# Patient Record
Sex: Female | Born: 1940 | Race: Black or African American | Hispanic: No | Marital: Single | Smoking: Former smoker
Health system: Southern US, Community
[De-identification: ages and names within clinical notes are randomized; demographics above are authoritative.]

## PROBLEM LIST (undated history)

## (undated) DIAGNOSIS — I10 Essential (primary) hypertension: Secondary | ICD-10-CM

## (undated) DIAGNOSIS — Z720 Tobacco use: Secondary | ICD-10-CM

## (undated) DIAGNOSIS — I499 Cardiac arrhythmia, unspecified: Secondary | ICD-10-CM

## (undated) DIAGNOSIS — K5790 Diverticulosis of intestine, part unspecified, without perforation or abscess without bleeding: Secondary | ICD-10-CM

## (undated) DIAGNOSIS — Z9889 Other specified postprocedural states: Secondary | ICD-10-CM

## (undated) DIAGNOSIS — R011 Cardiac murmur, unspecified: Secondary | ICD-10-CM

## (undated) DIAGNOSIS — R197 Diarrhea, unspecified: Secondary | ICD-10-CM

## (undated) DIAGNOSIS — E119 Type 2 diabetes mellitus without complications: Secondary | ICD-10-CM

## (undated) DIAGNOSIS — M1990 Unspecified osteoarthritis, unspecified site: Secondary | ICD-10-CM

## (undated) DIAGNOSIS — R600 Localized edema: Secondary | ICD-10-CM

## (undated) DIAGNOSIS — I509 Heart failure, unspecified: Secondary | ICD-10-CM

## (undated) DIAGNOSIS — Z8249 Family history of ischemic heart disease and other diseases of the circulatory system: Secondary | ICD-10-CM

## (undated) DIAGNOSIS — Z8719 Personal history of other diseases of the digestive system: Secondary | ICD-10-CM

## (undated) DIAGNOSIS — J45909 Unspecified asthma, uncomplicated: Secondary | ICD-10-CM

## (undated) DIAGNOSIS — E785 Hyperlipidemia, unspecified: Secondary | ICD-10-CM

## (undated) DIAGNOSIS — K219 Gastro-esophageal reflux disease without esophagitis: Secondary | ICD-10-CM

## (undated) HISTORY — DX: Essential (primary) hypertension: I10

## (undated) HISTORY — PX: OTHER SURGICAL HISTORY: SHX169

## (undated) HISTORY — DX: Diarrhea, unspecified: R19.7

## (undated) HISTORY — DX: Hyperlipidemia, unspecified: E78.5

## (undated) HISTORY — DX: Family history of ischemic heart disease and other diseases of the circulatory system: Z82.49

## (undated) HISTORY — PX: CATARACT EXTRACTION, BILATERAL: SHX1313

## (undated) HISTORY — DX: Cardiac arrhythmia, unspecified: I49.9

## (undated) HISTORY — DX: Personal history of other diseases of the digestive system: Z98.890

## (undated) HISTORY — DX: Diverticulosis of intestine, part unspecified, without perforation or abscess without bleeding: K57.90

## (undated) HISTORY — DX: Tobacco use: Z72.0

## (undated) HISTORY — DX: Gastro-esophageal reflux disease without esophagitis: K21.9

## (undated) HISTORY — DX: Personal history of other diseases of the digestive system: Z87.19

## (undated) HISTORY — PX: TONSILLECTOMY: SUR1361

## (undated) HISTORY — PX: COLONOSCOPY: SHX174

## (undated) HISTORY — DX: Unspecified asthma, uncomplicated: J45.909

## (undated) HISTORY — DX: Localized edema: R60.0

## (undated) SURGERY — Surgical Case
Anesthesia: *Unknown

---

## 1998-06-03 ENCOUNTER — Encounter: Payer: Self-pay | Admitting: Gastroenterology

## 1998-06-03 ENCOUNTER — Ambulatory Visit (HOSPITAL_COMMUNITY): Admission: RE | Admit: 1998-06-03 | Discharge: 1998-06-03 | Payer: Self-pay | Admitting: Gastroenterology

## 1998-06-09 ENCOUNTER — Other Ambulatory Visit: Admission: RE | Admit: 1998-06-09 | Discharge: 1998-06-09 | Payer: Self-pay | Admitting: Obstetrics and Gynecology

## 1999-05-26 ENCOUNTER — Other Ambulatory Visit: Admission: RE | Admit: 1999-05-26 | Discharge: 1999-05-26 | Payer: Self-pay | Admitting: Obstetrics and Gynecology

## 1999-10-18 ENCOUNTER — Encounter: Payer: Self-pay | Admitting: Internal Medicine

## 1999-10-18 ENCOUNTER — Encounter: Admission: RE | Admit: 1999-10-18 | Discharge: 1999-10-18 | Payer: Self-pay | Admitting: Internal Medicine

## 2000-05-25 ENCOUNTER — Other Ambulatory Visit: Admission: RE | Admit: 2000-05-25 | Discharge: 2000-05-25 | Payer: Self-pay | Admitting: Obstetrics and Gynecology

## 2000-11-01 ENCOUNTER — Encounter: Payer: Self-pay | Admitting: Internal Medicine

## 2000-11-01 ENCOUNTER — Encounter: Admission: RE | Admit: 2000-11-01 | Discharge: 2000-11-01 | Payer: Self-pay | Admitting: Internal Medicine

## 2000-11-21 ENCOUNTER — Encounter: Payer: Self-pay | Admitting: Internal Medicine

## 2000-11-21 ENCOUNTER — Encounter: Admission: RE | Admit: 2000-11-21 | Discharge: 2000-11-21 | Payer: Self-pay | Admitting: Internal Medicine

## 2001-04-25 ENCOUNTER — Encounter: Payer: Self-pay | Admitting: Internal Medicine

## 2001-04-25 ENCOUNTER — Encounter: Admission: RE | Admit: 2001-04-25 | Discharge: 2001-04-25 | Payer: Self-pay | Admitting: Internal Medicine

## 2001-05-27 ENCOUNTER — Other Ambulatory Visit: Admission: RE | Admit: 2001-05-27 | Discharge: 2001-05-27 | Payer: Self-pay | Admitting: *Deleted

## 2001-11-22 ENCOUNTER — Encounter: Admission: RE | Admit: 2001-11-22 | Discharge: 2001-11-22 | Payer: Self-pay | Admitting: Internal Medicine

## 2001-11-22 ENCOUNTER — Encounter: Payer: Self-pay | Admitting: Internal Medicine

## 2002-04-22 ENCOUNTER — Ambulatory Visit (HOSPITAL_BASED_OUTPATIENT_CLINIC_OR_DEPARTMENT_OTHER): Admission: RE | Admit: 2002-04-22 | Discharge: 2002-04-22 | Payer: Self-pay | Admitting: Orthopaedic Surgery

## 2002-05-28 ENCOUNTER — Other Ambulatory Visit: Admission: RE | Admit: 2002-05-28 | Discharge: 2002-05-28 | Payer: Self-pay | Admitting: Obstetrics and Gynecology

## 2002-12-02 ENCOUNTER — Encounter: Payer: Self-pay | Admitting: Internal Medicine

## 2002-12-02 ENCOUNTER — Encounter: Admission: RE | Admit: 2002-12-02 | Discharge: 2002-12-02 | Payer: Self-pay | Admitting: Internal Medicine

## 2003-06-09 ENCOUNTER — Other Ambulatory Visit: Admission: RE | Admit: 2003-06-09 | Discharge: 2003-06-09 | Payer: Self-pay | Admitting: Obstetrics and Gynecology

## 2003-06-24 ENCOUNTER — Encounter: Admission: RE | Admit: 2003-06-24 | Discharge: 2003-06-24 | Payer: Self-pay | Admitting: Internal Medicine

## 2003-12-03 ENCOUNTER — Encounter: Admission: RE | Admit: 2003-12-03 | Discharge: 2003-12-03 | Payer: Self-pay | Admitting: Internal Medicine

## 2004-05-10 ENCOUNTER — Encounter: Admission: RE | Admit: 2004-05-10 | Discharge: 2004-05-10 | Payer: Self-pay | Admitting: Otolaryngology

## 2004-05-13 ENCOUNTER — Ambulatory Visit (HOSPITAL_COMMUNITY): Admission: RE | Admit: 2004-05-13 | Discharge: 2004-05-13 | Payer: Self-pay | Admitting: Otolaryngology

## 2004-05-16 ENCOUNTER — Encounter (INDEPENDENT_AMBULATORY_CARE_PROVIDER_SITE_OTHER): Payer: Self-pay | Admitting: Specialist

## 2004-05-16 ENCOUNTER — Ambulatory Visit (HOSPITAL_BASED_OUTPATIENT_CLINIC_OR_DEPARTMENT_OTHER): Admission: RE | Admit: 2004-05-16 | Discharge: 2004-05-16 | Payer: Self-pay | Admitting: Otolaryngology

## 2004-05-18 ENCOUNTER — Emergency Department (HOSPITAL_COMMUNITY): Admission: EM | Admit: 2004-05-18 | Discharge: 2004-05-18 | Payer: Self-pay | Admitting: Emergency Medicine

## 2004-11-04 ENCOUNTER — Ambulatory Visit: Payer: Self-pay | Admitting: Gastroenterology

## 2004-11-24 ENCOUNTER — Ambulatory Visit: Payer: Self-pay | Admitting: Gastroenterology

## 2004-12-05 ENCOUNTER — Encounter: Admission: RE | Admit: 2004-12-05 | Discharge: 2004-12-05 | Payer: Self-pay | Admitting: Internal Medicine

## 2005-02-10 ENCOUNTER — Encounter: Admission: RE | Admit: 2005-02-10 | Discharge: 2005-02-10 | Payer: Self-pay | Admitting: Internal Medicine

## 2005-12-12 ENCOUNTER — Encounter: Admission: RE | Admit: 2005-12-12 | Discharge: 2005-12-12 | Payer: Self-pay | Admitting: Internal Medicine

## 2006-01-04 ENCOUNTER — Encounter: Admission: RE | Admit: 2006-01-04 | Discharge: 2006-01-04 | Payer: Self-pay | Admitting: Internal Medicine

## 2007-01-08 ENCOUNTER — Encounter: Admission: RE | Admit: 2007-01-08 | Discharge: 2007-01-08 | Payer: Self-pay | Admitting: Internal Medicine

## 2007-04-25 ENCOUNTER — Encounter: Admission: RE | Admit: 2007-04-25 | Discharge: 2007-04-25 | Payer: Self-pay | Admitting: Internal Medicine

## 2007-07-12 ENCOUNTER — Ambulatory Visit: Payer: Self-pay | Admitting: Gastroenterology

## 2007-07-12 LAB — CONVERTED CEMR LAB
AST: 21 units/L (ref 0–37)
Albumin: 3.9 g/dL (ref 3.5–5.2)
Alkaline Phosphatase: 68 units/L (ref 39–117)
BUN: 12 mg/dL (ref 6–23)
Bacteria, UA: NEGATIVE
Basophils Relative: 0.5 % (ref 0.0–1.0)
Bilirubin Urine: NEGATIVE
Calcium: 9.3 mg/dL (ref 8.4–10.5)
Creatinine, Ser: 0.9 mg/dL (ref 0.4–1.2)
Crystals: NEGATIVE
Eosinophils Relative: 5 % (ref 0.0–5.0)
GFR calc Af Amer: 80 mL/min
GFR calc non Af Amer: 66 mL/min
HCT: 41.7 % (ref 36.0–46.0)
Hemoglobin: 13.9 g/dL (ref 12.0–15.0)
Leukocytes, UA: NEGATIVE
Lymphocytes Relative: 47.3 % — ABNORMAL HIGH (ref 12.0–46.0)
Monocytes Absolute: 0.4 10*3/uL (ref 0.2–0.7)
Monocytes Relative: 7.4 % (ref 3.0–11.0)
Mucus, UA: NEGATIVE
Nitrite: NEGATIVE
RDW: 12.4 % (ref 11.5–14.6)
Specific Gravity, Urine: 1.025 (ref 1.000–1.03)
Total Bilirubin: 0.7 mg/dL (ref 0.3–1.2)
Total Protein, Urine: NEGATIVE mg/dL
Total Protein: 7 g/dL (ref 6.0–8.3)
Urobilinogen, UA: 0.2 (ref 0.0–1.0)
WBC: 4.8 10*3/uL (ref 4.5–10.5)
pH: 6 (ref 5.0–8.0)

## 2007-07-15 ENCOUNTER — Ambulatory Visit: Payer: Self-pay | Admitting: Internal Medicine

## 2007-07-23 ENCOUNTER — Ambulatory Visit: Payer: Self-pay | Admitting: Gastroenterology

## 2007-07-23 LAB — CONVERTED CEMR LAB
Bilirubin Urine: NEGATIVE
Crystals: NEGATIVE
Mucus, UA: NEGATIVE
Nitrite: NEGATIVE
Urine Glucose: NEGATIVE mg/dL
Urobilinogen, UA: 0.2 (ref 0.0–1.0)

## 2007-08-20 ENCOUNTER — Ambulatory Visit: Payer: Self-pay | Admitting: Gastroenterology

## 2007-08-20 DIAGNOSIS — R1013 Epigastric pain: Secondary | ICD-10-CM | POA: Insufficient documentation

## 2007-12-11 HISTORY — PX: TRANSTHORACIC ECHOCARDIOGRAM: SHX275

## 2008-01-10 ENCOUNTER — Encounter: Admission: RE | Admit: 2008-01-10 | Discharge: 2008-01-10 | Payer: Self-pay | Admitting: Obstetrics and Gynecology

## 2008-08-25 ENCOUNTER — Encounter: Payer: Self-pay | Admitting: Gastroenterology

## 2009-01-14 ENCOUNTER — Inpatient Hospital Stay (HOSPITAL_COMMUNITY): Admission: EM | Admit: 2009-01-14 | Discharge: 2009-01-16 | Payer: Self-pay | Admitting: Emergency Medicine

## 2009-01-15 HISTORY — PX: CARDIAC CATHETERIZATION: SHX172

## 2009-01-25 ENCOUNTER — Encounter: Admission: RE | Admit: 2009-01-25 | Discharge: 2009-01-25 | Payer: Self-pay | Admitting: Internal Medicine

## 2010-01-20 ENCOUNTER — Ambulatory Visit: Payer: Self-pay | Admitting: Gastroenterology

## 2010-01-20 DIAGNOSIS — R1310 Dysphagia, unspecified: Secondary | ICD-10-CM | POA: Insufficient documentation

## 2010-01-20 DIAGNOSIS — K219 Gastro-esophageal reflux disease without esophagitis: Secondary | ICD-10-CM | POA: Insufficient documentation

## 2010-01-21 ENCOUNTER — Telehealth: Payer: Self-pay | Admitting: Gastroenterology

## 2010-02-03 ENCOUNTER — Ambulatory Visit (HOSPITAL_COMMUNITY): Admission: RE | Admit: 2010-02-03 | Discharge: 2010-02-03 | Payer: Self-pay | Admitting: Obstetrics and Gynecology

## 2010-02-18 ENCOUNTER — Ambulatory Visit: Payer: Self-pay | Admitting: Gastroenterology

## 2010-03-25 ENCOUNTER — Ambulatory Visit: Payer: Self-pay | Admitting: Gastroenterology

## 2010-04-05 ENCOUNTER — Ambulatory Visit: Payer: Self-pay | Admitting: Gastroenterology

## 2010-05-17 ENCOUNTER — Encounter: Admission: RE | Admit: 2010-05-17 | Discharge: 2010-05-17 | Payer: Self-pay | Admitting: Internal Medicine

## 2010-07-03 ENCOUNTER — Encounter: Payer: Self-pay | Admitting: Obstetrics and Gynecology

## 2010-07-03 ENCOUNTER — Encounter: Payer: Self-pay | Admitting: Internal Medicine

## 2010-07-12 NOTE — Procedures (Signed)
Summary: colonoscopy   Colonoscopy  Procedure date:  11/24/2004  Findings:      Results: Hemorrhoids.     Results: Diverticulosis.       Location:  [facility].   Patient Name: Megan Hull, Megan Hull MRN:  Procedure Procedures: Colonoscopy CPT: 40981.  Personnel: Endoscopist: Barbette Hair. Arlyce Dice, MD.  Patient Consent: Procedure, Alternatives, Risks and Benefits discussed, consent obtained, from patient.  Indications Symptoms: Hematochezia.  History  Current Medications: Patient is not currently taking Coumadin.  Pre-Exam Physical: Performed Nov 24, 2004. Cardio-pulmonary exam, HEENT exam , Abdominal exam, Mental status exam WNL.  Exam Exam: Extent of exam reached: Cecum, extent intended: Cecum.  The cecum was identified by IC valve. Colon retroflexion performed. ASA Classification: III. Tolerance: good.  Monitoring: Pulse and BP monitoring, Oximetry used. Supplemental O2 given. at 2 Liters.  Colon Prep Used Miralax for colon prep. Prep results: good.  Sedation Meds: Patient assessed and found to be appropriate for moderate (conscious) sedation. Sedation was managed by the Endoscopist. Fentanyl 125 mcg. given IV. Versed 12 given IV.  Findings - DIVERTICULOSIS: Descending Colon to Sigmoid Colon. ICD9: Diverticulosis: 562.10. Comments: Diffuse diverticulosis.  NORMAL EXAM: Cecum.  HEMORRHOIDS: Internal. ICD9: Hemorrhoids, Internal: 455.0.   Assessment Abnormal examination, see findings above.  Diagnoses: 562.10: Diverticulosis.  455.0: Hemorrhoids, Internal.   Events  Unplanned Interventions: No intervention was required.  Unplanned Events: There were no complications. Plans  Post Exam Instructions: Post sedation instructions given.  Patient Education: Patient given standard instructions for: Diverticulosis. Hemorrhoids.  Disposition: After procedure patient sent to recovery. After recovery patient sent home.  Scheduling/Referral: Follow-Up  prn.  This report was created from the original endoscopy report, which was reviewed and signed by the above listed endoscopist.

## 2010-07-12 NOTE — Letter (Signed)
Summary: EGD Instructions  [facility]  [address] [place], [place] [address]   Phone: [phone]  Fax: [phone]       Megan Hull    Feb 27, 1941    MRN: [id number]       Procedure Day /Date:FRIDAY 02/18/2010     Arrival Time: 2PM     Procedure Time:3PM     Location of Procedure:                    X  [facility] (4th Floor)  PREPARATION FOR ENDOSCOPY/DIL   On 02/18/2010  THE DAY OF THE PROCEDURE:  1.   No solid foods, milk or milk products are allowed after midnight the night before your procedure.  2.   Do not drink anything colored red or purple.  Avoid juices with pulp.  No orange juice.  3.  You may drink clear liquids until1PM, which is 2 hours before your procedure.                                                                                                CLEAR LIQUIDS INCLUDE: Water Jello Ice Popsicles Tea (sugar ok, no milk/cream) Powdered fruit flavored drinks Coffee (sugar ok, no milk/cream) Gatorade Juice: apple, white grape, white cranberry  Lemonade Clear bullion, consomm, broth Carbonated beverages (any kind) Strained chicken noodle soup Hard Candy   MEDICATION INSTRUCTIONS  Unless otherwise instructed, you should take regular prescription medications with a small sip of water as early as possible the morning of your procedure.            OTHER INSTRUCTIONS  You will need a responsible adult at least 71 years of age to accompany you and drive you home.   This person must remain in the waiting room during your procedure.  Wear loose fitting clothing that is easily removed.  Leave jewelry and other valuables at home.  However, you may wish to bring a book to read or an iPod/MP3 player to listen to music as you wait for your procedure to start.  Remove all body piercing jewelry and leave at home.  Total time from sign-in until discharge is approximately 2-3 hours.  You should go home directly after  your procedure and rest.  You can resume normal activities the day after your procedure.  The day of your procedure you should not:   Drive   Make legal decisions   Operate machinery   Drink alcohol   Return to work  You will receive specific instructions about eating, activities and medications before you leave.    The above instructions have been reviewed and explained to me by   _______________________    I fully understand and can verbalize these instructions _____________________________ Date _________

## 2010-07-12 NOTE — Assessment & Plan Note (Signed)
Summary: f/u from endo--ch.   History of Present Illness Visit Type: Follow-up Visit Primary GI MD: Melvia Heaps MD [facility] Primary Provider: [name], MD Requesting Provider: na Chief Complaint: F/u from Endo. Pt c/o diarrhea, and more hiccups but denies any other GI complaints   History of Present Illness:   [name] has returned following upper endoscopy and esophageal dilatation.  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  She no longer has dysphagia.  She has occasional breakthrough pyrosis.  She also may develop very limited episodes of hiccups while eating.  She remains on Zegerid.   GI Review of Systems    Reports belching.      Denies abdominal pain, acid reflux, bloating, chest pain, dysphagia with liquids, dysphagia with solids, heartburn, loss of appetite, nausea, vomiting, vomiting blood, weight loss, and  weight gain.      Reports diarrhea.     Denies anal fissure, black tarry stools, change in bowel habit, constipation, diverticulosis, fecal incontinence, heme positive stool, hemorrhoids, irritable bowel syndrome, jaundice, light color stool, liver problems, rectal bleeding, and  rectal pain.    Current Medications (verified): 1)  Losartan Potassium-Hctz 100-12.5 Mg Tabs (Losartan Potassium-Hctz) .... Once Daily 2)  Metoprolol Tartrate 25 Mg Tabs (Metoprolol Tartrate) .... Take 1/2 Tablet By Mouth Two Times A Day 3)  Simvastatin 80 Mg Tabs (Simvastatin) .... Once Daily 4)  Aspirin 81 Mg Tbec (Aspirin) .... Take 2 Tablets Daily 5)  Maalox Plus 225-200-25 Mg/16ml Susp (Alum & Mag Hydroxide-Simeth) .... Two Times A Day 6)  Zegerid 40-1100 Mg Caps (Omeprazole-Sodium Bicarbonate) .... Take One Tab One Half Hour Before Breakfast and H.s.  Allergies (verified): 1)  ! 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 Tonsillectomy  Family History: Reviewed history from 01/20/2010 and no changes required. Family History of Colon Cancer:Brother Family History of Diabetes:  Family History of Heart Disease:   Social History: Reviewed history from 01/20/2010 and no changes required. Occupation: Retired Patient is a former smoker.  Alcohol Use - yes Daily Caffeine Use Illicit Drug Use - no  Review of Systems  The patient denies allergy/sinus, anemia, anxiety-new, arthritis/joint pain, back pain, blood in urine, breast changes/lumps, change in vision, confusion, cough, coughing up blood, depression-new, fainting, fatigue, fever, headaches-new, hearing problems, heart murmur, heart rhythm changes, itching, menstrual pain, muscle pains/cramps, night sweats, nosebleeds, pregnancy symptoms, shortness of breath, skin rash, sleeping problems, sore throat, swelling of feet/legs, swollen lymph glands, thirst - excessive , urination - excessive , urination changes/pain, urine leakage, vision changes, and voice change.    Vital Signs:  Patient profile:   70 year old female Height:      65 inches Weight:      162 pounds BMI:     27.06 BSA:     1.81 Pulse rate:   72 / minute Pulse rhythm:   regular BP sitting:   112 / 60  (left arm) Cuff size:   regular  Vitals Entered By: Ok Anis CMA (March 25, 2010 9:48 AM)   Impression & Recommendations:  Problem # 1:  DYSPHAGIA UNSPECIFIED (ICD-787.20) Assessment Improved Plan repeat dilatation p.r.n.  Problem # 2:  ESOPHAGEAL REFLUX (ICD-530.81) Symptoms are fairly well controlled zegerid.  Plan to continue with the same.  Patient Instructions: 1)  Copy sent  to : [name], MD 2)  Follow up as needed  3)  The medication list was reviewed and reconciled.  All changed / newly  prescribed medications were explained.  A complete medication list was provided to the patient / caregiver.

## 2010-07-12 NOTE — Letter (Signed)
Summary: [facility] Instructions  [facility]  [address] [place], [place] [address]   Phone: [phone]  Fax: [phone]       Megan Hull    December 13, 1940    MRN: [id number]        Procedure Day /Date:TUESDAY 04/05/2010     Arrival Time:9:30AM     Procedure Time:10:30AM     Location of Procedure:                    X  [facility] (4th Floor)                        PREPARATION FOR COLONOSCOPY WITH MOVIPREP   Starting 5 days prior to your procedure 10/20/2011o not eat nuts, seeds, popcorn, corn, beans, peas,  salads, or any raw vegetables.  Do not take any fiber supplements (e.g. Metamucil, Citrucel, and Benefiber).  THE DAY BEFORE YOUR PROCEDURE         DATE:04/04/2010 DAY: MONDAY  1.  Drink clear liquids the entire day-NO SOLID FOOD  2.  Do not drink anything colored red or purple.  Avoid juices with pulp.  No orange juice.  3.  Drink at least 64 oz. (8 glasses) of fluid/clear liquids during the day to prevent dehydration and help the prep work efficiently.  CLEAR LIQUIDS INCLUDE: Water Jello Ice Popsicles Tea (sugar ok, no milk/cream) Powdered fruit flavored drinks Coffee (sugar ok, no milk/cream) Gatorade Juice: apple, white grape, white cranberry  Lemonade Clear bullion, consomm, broth Carbonated beverages (any kind) Strained chicken noodle soup Hard Candy                             4.  In the morning, mix first dose of MoviPrep solution:    Empty 1 Pouch A and 1 Pouch B into the disposable container    Add lukewarm drinking water to the top line of the container. Mix to dissolve    Refrigerate (mixed solution should be used within 24 hrs)  5.  Begin drinking the prep at 5:00 p.m. The MoviPrep container is divided by 4 marks.   Every 15 minutes drink the solution down to the next Megan (approximately 8 oz) until the full liter is complete.   6.  Follow completed prep with 16 oz of clear liquid of your choice  (Nothing red or purple).  Continue to drink clear liquids until bedtime.  7.  Before going to bed, mix second dose of MoviPrep solution:    Empty 1 Pouch A and 1 Pouch B into the disposable container    Add lukewarm drinking water to the top line of the container. Mix to dissolve    Refrigerate  THE DAY OF YOUR PROCEDURE      DATE: 04/05/2010   DAY: TUESDAY  Beginning at 7:30AM. (5 hours before procedure):         1. Every 15 minutes, drink the solution down to the next Megan (approx 8 oz) until the full liter is complete.  2. Follow completed prep with 16 oz. of clear liquid of your choice.    3. You may drink clear liquids until 8:30AM (2 HOURS BEFORE PROCEDURE).   MEDICATION INSTRUCTIONS  Unless otherwise instructed, you should take regular prescription medications with a small sip of water   as early as possible the morning of your procedure.  OTHER INSTRUCTIONS  You will need a responsible adult at least 71 years of age to accompany you and drive you home.   This person must remain in the waiting room during your procedure.  Wear loose fitting clothing that is easily removed.  Leave jewelry and other valuables at home.  However, you may wish to bring a book to read or  an iPod/MP3 player to listen to music as you wait for your procedure to start.  Remove all body piercing jewelry and leave at home.  Total time from sign-in until discharge is approximately 2-3 hours.  You should go home directly after your procedure and rest.  You can resume normal activities the  day after your procedure.  The day of your procedure you should not:   Drive   Make legal decisions   Operate machinery   Drink alcohol   Return to work  You will receive specific instructions about eating, activities and medications before you leave.    The above instructions have been reviewed and explained to me by   _______________________    I fully understand and can  verbalize these instructions _____________________________ Date _________

## 2010-07-12 NOTE — Procedures (Signed)
Summary: Colonoscopy/[place]  Colonoscopy/[place]   Imported By: Sherian Rein 01/20/2010 15:09:25  _____________________________________________________________________  External Attachment:    Type:   Image     Comment:   External Document

## 2010-07-12 NOTE — Progress Notes (Signed)
Summary: Questions about meds.  Phone Note Call from Patient Call back at Home Phone [phone]   Caller: Patient Call For: Dr. Arlyce Dice Reason for Call: Talk to Nurse Summary of Call: has some questions about her meds. what she is suppose to be taking Initial call taken by: Karna Christmas,  January 21, 2010 8:18 AM  Follow-up for Phone Call        Pt. wanted to clarify med orders from [facility] yesterday. Pt. advised to stop Lansoprazole and begin Zegerid two times a day. No other ?'s at this time. Pt. instructed to call back as needed.  Follow-up by: Laureen Ochs LPN,  January 21, 2010 9:16 AM

## 2010-07-12 NOTE — Assessment & Plan Note (Signed)
Summary: painful swallowing--ch.   History of Present Illness Visit Type: Follow-up Visit Primary GI MD: Melvia Heaps MD [facility] Primary Provider: [name], MD Chief Complaint: Solid food dysphagia with severe pain History of Present Illness:   Megan Hull is a 70yo female here for evaluation of odynophagia and dysphagia.  For several months she has had severe burning chest discomfort.  She has dysphagia to solids and burning discomfort with swallowing.  She is taking Prevacid with only partial relief.  She is on no gastric irritants.  Weight has been stable. She also complains of heaviness and fullness  in her upper abdomen.  Family history is pertinent for a  brother who had colon cancer.  Her last colonoscopy demonstrated diverticulosis and hemorrhoids, in 2006.  She has no lower GI complaints.   GI Review of Systems    Reports acid reflux, chest pain, dysphagia with liquids, dysphagia with solids, and  vomiting.      Denies abdominal pain, belching, bloating, heartburn, loss of appetite, nausea, vomiting blood, weight loss, and  weight gain.      Reports constipation and  diarrhea.     Denies anal fissure, black tarry stools, change in bowel habit, diverticulosis, fecal incontinence, heme positive stool, hemorrhoids, irritable bowel syndrome, jaundice, light color stool, liver problems, rectal bleeding, and  rectal pain. Preventive Screening-Counseling & Management  Alcohol-Tobacco     Smoking Status: quit      Drug Use:  no.      Current Medications (verified): 1)  Losartan Potassium-Hctz 100-12.5 Mg Tabs (Losartan Potassium-Hctz) .... Once Daily 2)  Metoprolol Tartrate 25 Mg Tabs (Metoprolol Tartrate) .... Take 1/2 Tablet By Mouth Two Times A Day 3)  Simvastatin 80 Mg Tabs (Simvastatin) .... Once Daily 4)  Aspirin 81 Mg Tbec (Aspirin) .... Take 2 Tablets Daily 5)  Lansoprazole 30 Mg Cpdr (Lansoprazole) .... Once Daily 6)  Maalox Plus 225-200-25 Mg/47ml Susp (Alum & Mag  Hydroxide-Simeth) .... Two Times A Day  Allergies (verified): 1)  ! Codeine  Past History:  Past Medical History: Reviewed history from 01/14/2010 and no changes required. Current Problems:  ABDOMINAL PAIN, EPIGASTRIC (ICD-789.06) Diverticulosis Hemorrhoids, internal  Past Surgical History: Tonsillectomy  Family History: Family History of Colon Cancer:Brother Family History of Diabetes:  Family History of Heart Disease:   Social History: Occupation: Retired Patient is a former smoker.  Alcohol Use - yes Daily Caffeine Use Illicit Drug Use - no Smoking Status:  quit Drug Use:  no  Review of Systems       The patient complains of arthritis/joint pain, heart rhythm changes, and swelling of feet/legs.  The patient denies allergy/sinus, anemia, anxiety-new, back pain, blood in urine, breast changes/lumps, change in vision, confusion, cough, coughing up blood, depression-new, fainting, fatigue, fever, headaches-new, hearing problems, heart murmur, itching, menstrual pain, muscle pains/cramps, night sweats, nosebleeds, pregnancy symptoms, shortness of breath, skin rash, sleeping problems, sore throat, swollen lymph glands, thirst - excessive , urination - excessive , urination changes/pain, urine leakage, vision changes, and voice change.         All other systems were reviewed and were negative   Vital Signs:  Patient profile:   70 year old female Height:      65 inches Weight:      163.38 pounds BMI:     27.29 Pulse rate:   60 / minute Pulse rhythm:   regular BP sitting:   110 / 68  (left arm) Cuff size:   regular  Vitals Entered  By: June McMurray CMA Duncan Dull) (January 20, 2010 8:56 AM)  Physical Exam  Additional Exam:  On physical exam she is a well-developed well-nourished female  skin: anicteric HEENT: normocephalic; PEERLA; no nasal or pharyngeal abnormalities neck: supple nodes: no cervical lymphadenopathy chest: clear to ausculatation and percussion heart: no  murmurs, gallops, or rubs abd: soft, nontender; BS normoactive; no abdominal masses, tenderness, organomegaly rectal: deferred ext: no cynanosis, clubbing, edema skeletal: no deformities neuro: oriented x 3; no focal abnormalities    Impression & Recommendations:  Problem # 1:  ESOPHAGEAL REFLUX (ICD-530.81) Assessment Deteriorated  Plan switch  to Zegerid 40 mg q.a.m. and q.h.s.  I will schedule endoscopy for further evaluation.  Risks, alternatives, and complications of the procedure, including bleeding, perforation, and possible need for surgery, were explained to the patient.  Patient's questions were answered.  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  3)  Conscious Sedation brochure given.  4)  GI Reflux brochure given.  5)  Upper Endoscopy with Dilatation brochure given.  6)  Your EGD is scheduled for 02/18/2010 at 3pm 7)  Your Colonoscopy is scheduled for 03/08/2010 at 2:30pm 8)  You can pick up your MoviPrep from your pharmacy today 9)  The medication list was reviewed and reconciled.  All changed / newly prescribed medications were explained.  A complete medication list was provided to the patient / caregiver. Prescriptions: MOVIPREP 100 GM  SOLR (PEG-KCL-NACL-NASULF-NA ASC-C) As per prep instructions.  #1 x 0   Entered by:   Merri Ray CMA (AAMA)   Authorized by:   Louis Meckel MD   Signed by:   Merri Ray CMA (AAMA) on 01/20/2010   Method used:   Electronically to        [facility] (retail)       [address]       [place], [place]  [address]       Ph: [phone]       Fax: [phone]   RxID:   [id number] ZEGERID 40-1100 MG CAPS (OMEPRAZOLE-SODIUM BICARBONATE) take one tab one half hour before breakfast and h.s.  #60 x 2   Entered and Authorized by:   Louis Meckel MD   Signed by:   Louis Meckel MD on 01/20/2010   Method used:   Electronically to        [facility] (retail)       [address]       [place], [place]  [address]       Ph: [phone]       Fax: [phone]   RxID:   [id number]

## 2010-07-12 NOTE — Procedures (Signed)
Summary: Upper Endoscopy  Patient: Megan Hull Note: All result statuses are Final unless otherwise noted.  Tests: (1) Upper Endoscopy (EGD)   EGD Upper Endoscopy       DONE     [facility]     [address]     [place], [place]  [address]           ENDOSCOPY PROCEDURE REPORT           PATIENT:  Megan Hull, Megan Hull  MR#:  [id number]     BIRTHDATE:  1941-02-22, 69 yrs. old  GENDER:  female           ENDOSCOPIST:  Barbette Hair. Arlyce Dice, MD     Referred by:           PROCEDURE DATE:  02/18/2010     PROCEDURE:  EGD, diagnostic, Maloney Dilation of Esophagus     ASA CLASS:  Class II     INDICATIONS:  dysphagia           MEDICATIONS:   Fentanyl 50 mcg IV, Versed 6 mg IV, glycopyrrolate     (Robinal) 0.2 mg IV, 0.6cc simethancone 0.6 cc PO     TOPICAL ANESTHETIC:  Exactacain Spray           DESCRIPTION OF PROCEDURE:   After the risks benefits and     alternatives of the procedure were thoroughly explained, informed     consent was obtained.  The [facility] [id number] endoscope was     introduced through the mouth and advanced to the third portion of     the duodenum, without limitations.  The instrument was slowly     withdrawn as the mucosa was fully examined.           The upper, middle, and distal third of the esophagus were     carefully inspected and no abnormalities were noted. The z-line     was well seen at the GEJ. The endoscope was pushed into the fundus     which was normal including a retroflexed view. The antrum,gastric     body, first and second part of the duodenum were unremarkable.     Dilation with maloney dilator 18mm Dysphagia without obvious     stricture. Dilator passed with minimal resistance; no heme     Retroflexed views revealed no abnormalities.    The scope was then     withdrawn from the patient and the procedure completed.           COMPLICATIONS:  None           ENDOSCOPIC IMPRESSION:     1) Normal EGD     RECOMMENDATIONS:     1)  continue PPI - zegerid     2) Call office next 2-3 days to schedule an office appointment     for 1 month           REPEAT EXAM:  No           ______________________________     Barbette Hair. Arlyce Dice, MD           CC:  Kellie Shropshire, MD           n.     Rosalie Doctor:   Barbette Hair. Kaplan at 02/18/2010 03:07 PM           Baker Janus, [phone]  Note: An exclamation mark (!) indicates a result that was not dispersed into the flowsheet. Document Creation Date: 02/18/2010 3:08  PM _______________________________________________________________________  (1) Order result status: Final Collection or observation date-time: 02/18/2010 15:04 Requested date-time:  Receipt date-time:  Reported date-time:  Referring Physician:   Ordering Physician: Melvia Heaps [phone]) Specimen Source:  Source: Launa Grill Order Number: [id number] Lab site:

## 2010-07-12 NOTE — Procedures (Signed)
Summary: Colonoscopy  Patient: Megan Hull Note: All result statuses are Final unless otherwise noted.  Tests: (1) Colonoscopy (COL)   COL Colonoscopy           DONE     [facility]     [address]     [place], [place]  [address]           COLONOSCOPY PROCEDURE REPORT           PATIENT:  Megan, Hull  MR#:  [id number]     BIRTHDATE:  11-19-1940, 69 yrs. old  GENDER:  female           ENDOSCOPIST:  Barbette Hair. Arlyce Dice, MD     Referred by:           PROCEDURE DATE:  04/05/2010     PROCEDURE:  Diagnostic Colonoscopy     ASA CLASS:  Class II     INDICATIONS:  1) screening  2) family history of colon cancer     Brother           MEDICATIONS:   Fentanyl 50 mcg IV, Versed 5 mg IV           DESCRIPTION OF PROCEDURE:   After the risks benefits and     alternatives of the procedure were thoroughly explained, informed     consent was obtained.  Digital rectal exam was performed and     revealed no abnormalities.   The [id number] [id number] endoscope was     introduced through the anus and advanced to the cecum, which was     identified by both the appendix and ileocecal valve, without     limitations.  The quality of the prep was excellent, using     MoviPrep.  The instrument was then slowly withdrawn as the colon     was fully examined.     <<PROCEDUREIMAGES>>           FINDINGS:  Moderate diverticulosis was found in the sigmoid colon     (see image9 and image8).  Scattered diverticula were found.     descending to transverse colon  This was otherwise a normal     examination of the colon (see image3, image7, image10, and     image12).   Retroflexed views in the rectum revealed no     abnormalities.    The time to cecum =  2.0  minutes. The scope was     then withdrawn (time =  5.0  min) from the patient and the     procedure completed.           COMPLICATIONS:  None           ENDOSCOPIC IMPRESSION:     1) Moderate diverticulosis in the sigmoid colon     2)  Diverticula, scattered     3) Otherwise normal examination     RECOMMENDATIONS:     1) Given your significant family history of colon cancer, you     should have a repeat colonoscopy in 5 years           REPEAT EXAM:   5 year(s) Colonoscopy           ______________________________     Barbette Hair. Arlyce Dice, MD           CC: Kellie Shropshire, MD           n.     Rosalie Doctor:   Barbette Hair. Kaplan at 04/05/2010 11:17  AM           Baker Janus, [id number]  Note: An exclamation mark (!) indicates a result that was not dispersed into the flowsheet. Document Creation Date: 04/05/2010 11:17 AM _______________________________________________________________________  (1) Order result status: Final Collection or observation date-time: 04/05/2010 11:11 Requested date-time:  Receipt date-time:  Reported date-time:  Referring Physician:   Ordering Physician: Melvia Heaps [id number]) Specimen Source:  Source: Launa Grill Order Number: [id number] Lab site:   Appended Document: Colonoscopy    Clinical Lists Changes  Observations: Added new observation of COLONNXTDUE: 03/2015 (04/05/2010 14:45)

## 2010-09-17 LAB — URINALYSIS, ROUTINE W REFLEX MICROSCOPIC
Ketones, ur: NEGATIVE mg/dL
Leukocytes, UA: NEGATIVE
Nitrite: NEGATIVE
pH: 6 (ref 5.0–8.0)

## 2010-09-17 LAB — LIPID PANEL
LDL Cholesterol: 97 mg/dL (ref 0–99)
Triglycerides: 40 mg/dL (ref <150)
VLDL: 8 mg/dL (ref 0–40)

## 2010-09-17 LAB — POCT CARDIAC MARKERS
CKMB, poc: <1 ng/mL — ABNORMAL LOW (ref 1.0–8.0)
Myoglobin, poc: 104 ng/mL (ref 12–200)
Myoglobin, poc: 87.4 ng/mL (ref 12–200)

## 2010-09-17 LAB — COMPREHENSIVE METABOLIC PANEL
Albumin: 4 g/dL (ref 3.5–5.2)
Alkaline Phosphatase: 70 U/L (ref 39–117)
BUN: 13 mg/dL (ref 6–23)
CO2: 29 mEq/L (ref 19–32)
Chloride: 104 mEq/L (ref 96–112)
Creatinine, Ser: 0.92 mg/dL (ref 0.4–1.2)
GFR calc non Af Amer: >60 mL/min (ref >60)
Glucose, Bld: 101 mg/dL — ABNORMAL HIGH (ref 70–99)
Potassium: 3.6 mEq/L (ref 3.5–5.1)
Total Bilirubin: 0.7 mg/dL (ref 0.3–1.2)

## 2010-09-17 LAB — CBC
HCT: 38.4 % (ref 36.0–46.0)
HCT: 40.5 % (ref 36.0–46.0)
HCT: 41.1 % (ref 36.0–46.0)
Hemoglobin: 13.1 g/dL (ref 12.0–15.0)
Hemoglobin: 13.8 g/dL (ref 12.0–15.0)
MCHC: 33.6 g/dL (ref 30.0–36.0)
MCHC: 34.1 g/dL (ref 30.0–36.0)
MCV: 94.8 fL (ref 78.0–100.0)
Platelets: 196 10*3/uL (ref 150–400)
RDW: 13 % (ref 11.5–15.5)
RDW: 13 % (ref 11.5–15.5)
RDW: 13 % (ref 11.5–15.5)

## 2010-09-17 LAB — CARDIAC PANEL(CRET KIN+CKTOT+MB+TROPI)
CK, MB: 1.7 ng/mL (ref 0.3–4.0)
Relative Index: 1.1 (ref 0.0–2.5)

## 2010-09-17 LAB — HEMOGLOBIN A1C
Hgb A1c MFr Bld: 6.3 % — ABNORMAL HIGH (ref 4.6–6.1)
Mean Plasma Glucose: 134 mg/dL

## 2010-09-17 LAB — DIFFERENTIAL
Eosinophils Absolute: 0.2 10*3/uL (ref 0.0–0.7)
Eosinophils Relative: 4 % (ref 0–5)
Lymphs Abs: 1.8 10*3/uL (ref 0.7–4.0)

## 2010-09-17 LAB — POCT I-STAT, CHEM 8
Creatinine, Ser: 1 mg/dL (ref 0.4–1.2)
Hemoglobin: 14.6 g/dL (ref 12.0–15.0)
Sodium: 139 mEq/L (ref 135–145)
TCO2: 26 mmol/L (ref 0–100)

## 2010-09-17 LAB — BASIC METABOLIC PANEL
CO2: 27 mEq/L (ref 19–32)
Glucose, Bld: 129 mg/dL — ABNORMAL HIGH (ref 70–99)
Potassium: 3.9 mEq/L (ref 3.5–5.1)
Sodium: 138 mEq/L (ref 135–145)

## 2010-09-17 LAB — URINE MICROSCOPIC-ADD ON

## 2010-09-17 LAB — APTT: aPTT: 26 seconds (ref 24–37)

## 2010-09-17 LAB — BRAIN NATRIURETIC PEPTIDE: Pro B Natriuretic peptide (BNP): 43 pg/mL (ref 0.0–100.0)

## 2010-10-25 NOTE — Assessment & Plan Note (Signed)
[facility]                         GASTROENTEROLOGY OFFICE NOTE   Megan, Hull                   MRN:          [id number]  DATE:07/12/2007                            DOB:          May 09, 1941    Megan Hull has returned for reevaluation.  Over the last week or so  she has been complaining of moderately severe abdominal pain.  The pain  is rather diffuse and across her mid abdomen.  It may radiate to her  left lower back.  Initially the pain was constant and now is more  intermittent.  It is not affected by eating.  At one point she had a  bowel movement with some relief but pain has returned.  Stools have been  somewhat constipated.  She is without fever, nausea or pyrosis.  She  denies melena or hematochezia.  Pain occurs at rest and with any type of  movement.  She has a sensation of incomplete evacuation of her bowels.  She has had no prior similar abdominal complaints.  She is on no gastric  irritants including non-steroidals.  Megan Hull states that her pain  was worse with lying down which forced her to sleep upright for several  days.   MEDICATIONS:  1. Diovan.  2. Hydrochlorothiazide.  3. Vitamin D twice a week.   She has no allergies.   PHYSICAL EXAMINATION:  Pulse 80, blood pressure 136/80, weight 162.  HEENT: EOMI.  PERRLA.  Sclerae are anicteric.  Conjunctivae are pink.  NECK:  Supple without thyromegaly, adenopathy or carotid bruits.  CHEST:  Clear to auscultation and percussion without adventitious  sounds.  CARDIAC:  Regular rhythm; normal S1 S2.  There are no murmurs, gallops  or rubs.  ABDOMEN:  She has normoactive bowel sounds.  She has mild diffuse  tenderness without guarding or rebound.  She is particularly tender in  the left lower quadrant.  There are no abdominal masses or organomegaly,  stool is Hemoccult negative.  EXTREMITIES:  Full range of motion.  No cyanosis, clubbing or edema.  RECTAL:  There  are no masses.  Stool is Hemoccult negative.   IMPRESSION:  Persistent diffuse abdominal pain, especially in the mid  abdomen.  Symptoms are not specific, though could be due to anything  from ischemic colitis, pancreatitis, or perhaps active peptic disease.  I do not think biliary tract disease is an issue.   RECOMMENDATION:  1. Check CBC, amylase, lipase, and liver function tests.  2. CT of the abdomen and pelvis.   I will make further recommendations pending the results of the above.     Barbette Hair. Arlyce Dice, MD,FACG  Electronically Signed    RDK/MedQ  DD: 07/12/2007  DT: 07/13/2007  Job #: [id number]

## 2010-10-25 NOTE — Cardiovascular Report (Signed)
[name], [name] NO.:  [id number]   MEDICAL RECORD NO.:  [id number]          PATIENT TYPE:  OBV   LOCATION:  4729                         FACILITY:  [facility]   PHYSICIAN:  Nanetta Batty, M.D.   DATE OF BIRTH:  11/29/1940   DATE OF PROCEDURE:  DATE OF DISCHARGE:                            CARDIAC CATHETERIZATION   Megan Hull is a 70 year old African American female patient, Dr.  Hazle Coca and [facility]'s, with a history of hypertension and dyslipidemia.  She had negative stress test a year ago.  She was admitted to the  hospital with jaw pain consistent with unstable angina.  She ruled out  for myocardial infarction.  There are no EKG changes.  She presents now  for diagnostic coronary arteriography to rule out an ischemic etiology.  She was on IV heparin and nitroglycerin.   DESCRIPTION OF PROCEDURE:  The patient was brought to the Second Floor  [facility] in the postabsorptive state.  She is  premedicated with p.o. Valium.  Her right groin was prepped and shaved  in the usual sterile fashion.  Xylocaine 1% was used for local  anesthesia.  A 5-French sheath was inserted into the right femoral  artery using standard Seldinger technique.  A 5-French right and left  Judkins diagnostic catheters (JL-3.5 along with a no-torque catheter)  and an AR-1 catheter as well as pigtail catheter were used for selective  cholangiography and left ventriculography respectively.  Visipaque dye  was used for the entirety of the case.  Retrograde aortic, left  ventricular, and pullback pressures were recorded.   HEMODYNAMICS:  1. Aortic systolic pressure 151, diastolic pressure 77.  2. Left ventricular systolic pressure 143, end-diastolic pressure 18.   SELECTIVE CORONARY ANGIOGRAPHY:  1. Left main normal.  2. LAD; LAD had 30% proximal stenosis.  There was a 50-60% stenosis in      the LAD just after the second large diagonal branch, which appeared       smooth.  3. Circumflex; free of systemic disease.  4. Right coronary artery; anterior takeoff and subselectively      visualized, but free of systemic disease.  5. Left ventriculography; RAO left ventriculogram was performed using      25 mL of Visipaque dye at 12 mL per second.  The overall LVEF was      estimated greater than 60% without focal wall motion abnormalities.   IMPRESSION:  [name] has noncritical coronary artery disease with  preserved left ventricular function.  It is unclear to me that the  symptoms are ischemic in nature.  Medical therapy including antireflux  therapy will be recommended.  The patient will require an additional  outpatient Myoview to further evaluate for distal ischemia in the LAD  territory.   ACT was measured and the sheath was removed.  Pressures were held over  the site until achieved hemostasis.  The patient left the lab in stable  condition.      Nanetta Batty, M.D.  Electronically Signed     JB/MEDQ  D:  01/15/2009  T:  01/16/2009  Job:  [id number]   cc:  [facility]  [facility]  [place] R. [name], M.D.

## 2010-10-25 NOTE — Assessment & Plan Note (Signed)
[facility]                         GASTROENTEROLOGY OFFICE NOTE   ANTONIO, WOODHAMS                   MRN:          [id number]  DATE:08/20/2007                            DOB:          [birth year]    PROBLEM:  Abdominal pain.   Ms. Debruyne has returned for scheduled followup.  Workup including lab  and CT were unremarkable, except for hematuria.  She was evaluated by  Dr. Aldean Ast where no etiology was found.  Ms. Bert's abdominal  pain has subsided spontaneously.  She is feeling well, and has no GI  complaints.   EXAMINATION:  Pulse 84.  Blood pressure 138/66.  Weight 163.   IMPRESSION:  1. Abdominal pain of unclear etiology.  This is no longer an active      problem.  2. Microscopic hematuria.   RECOMMENDATIONS:  No further GI workup at this time.  I carefully  instructed Ms. Barca to contact me should she develop recurrent  abdominal pain.     Barbette Hair. Arlyce Dice, MD,FACG  Electronically Signed    RDK/MedQ  DD: 08/20/2007  DT: 08/20/2007  Job #: [id number]   cc:   Courtney Paris, M.D.

## 2010-10-28 NOTE — Op Note (Signed)
NAME:  Megan Hull, Megan Hull                      ACCOUNT NO.:  [id number]   MEDICAL RECORD NO.:  [id number]                   PATIENT TYPE:  AMB   LOCATION:  DSC                                  FACILITY:  [facility]   PHYSICIAN:  Lubertha Basque. Jerl Santos, M.D.             DATE OF BIRTH:  [birth year]   DATE OF PROCEDURE:  04/22/2002  DATE OF DISCHARGE:                                 OPERATIVE REPORT   PREOPERATIVE DIAGNOSES:  1. Right shoulder impingement.  2. Right shoulder rotator cuff tear, partial.   POSTOPERATIVE DIAGNOSES:  1. Right shoulder impingement.  2. Right shoulder rotator cuff tear, partial.   PROCEDURES:  1. Right shoulder arthroscopic acromioplasty.  2. Right shoulder arthroscopic debridement.   ANESTHESIA:  General.   ASSISTANT:  Lubertha Basque. Jerl Santos, M.D.   ASSISTANT:  Lindwood Qua, P.A.   INDICATION FOR PROCEDURE:  The patient is a 70 year old woman with a long  history of right shoulder pain.  This has persisted despite oral anti-  inflammatories and activity program.  She has also received a subacromial  injection, which did help for awhile.  She has been left with pain with  activity and pain with sleep.  She is offered an arthroscopy.  The procedure  was discussed with the patient and informed operative consent was obtained  after discussion of the possible complications of, reaction to anesthesia,  and infection.   DESCRIPTION OF PROCEDURE:  The patient was taken to the operating suite,  where a general anesthetic was applied without difficulty.  She was  positioned in a beach chair position and prepped and draped in a normal  sterile fashion.  After the administration of preop IV antibiotics, an  arthroscopy of the right shoulder was performed through a total of three  portals.  The glenohumeral joint showed no degenerative changes.  The biceps  tendon and all labral structures were intact.  The rotator cuff appeared  partially torn from below.  This was  about 25 or 30% of the thickness of the  rotator cuff and was addressed with a debridement.  In the subacromial space  I could find no evidence of a rotator cuff tear from that vantage point.  A  thorough bursectomy was done.  The entire cuff was well-visualized.  She did  have a prominent subacromial morphology, addressed with an acromioplasty  back to a flat surface.  This was done with a bur in the lateral position,  followed by transfer of the bur to the posterior position.  The shoulder was  thoroughly irrigated at the end of the case, followed by placement of  Marcaine with epinephrine and morphine.  Simple sutures of nylon were used  to loosely reapproximate the portals, followed by Adaptic and a dry gauze  dressing with tape.  Estimated blood loss and intraoperative fluids can be  obtained from anesthesia records.    DISPOSITION:  The patient was extubated in the operating  room and taken to  the recovery room in stable condition.  Plans were for her to go home the  same day and to follow up in the office in less than a week.  I will contact  her by phone tonight.                                               Lubertha Basque Jerl Santos, M.D.    PGD/MEDQ  D:  04/22/2002  T:  04/22/2002  Job:  [id number]

## 2010-10-28 NOTE — Op Note (Signed)
NAME:  Megan Hull, Megan Hull          ACCOUNT NO.:  [id number]   MEDICAL RECORD NO.:  [id number]          PATIENT TYPE:  AMB   LOCATION:  DSC                          FACILITY:  [facility]   PHYSICIAN:  Jefry H. Pollyann Kennedy, MD     DATE OF BIRTH:  Sep 12, 1940   DATE OF PROCEDURE:  05/16/2004  DATE OF DISCHARGE:                                 OPERATIVE REPORT   PREOPERATIVE DIAGNOSES:  1.  Extensive nasal and sinus polyps.  2.  Chronic ethmoid sinusitis.  3.  Chronic maxillary sinusitis.  4.  Chronic frontal sinusitis.  5.  Chronic sphenoid sinusitis.  6.  Enlargement of the middle turbinates.   POSTOPERATIVE DIAGNOSES:  1.  Extensive nasal and sinus polyps.  2.  Chronic ethmoid sinusitis.  3.  Chronic maxillary sinusitis.  4.  Chronic frontal sinusitis.  5.  Chronic sphenoid sinusitis.  6.  Enlargement of the middle turbinates.   OPERATION PERFORMED:  1.  Extensive bilateral left nasal and sinus polypectomy endoscopic.  2.  Bilateral total endoscopic ethmoidectomy.  3.  Bilateral endoscopic maxillary antrostomy with removal of polypoid      disease.  4.  Bilateral endoscopic frontal sinusotomy.  5.  Bilateral endoscopic sphenoidotomy.  6.  Bilateral endoscopic middle turbinate resection.  7.  InstaTrak image guided system use.   SURGEON:  Jefry H. Pollyann Kennedy, MD   ANESTHESIA:  General endotracheal.   COMPLICATIONS:  None.   ESTIMATED BLOOD LOSS:  Approximately 100 mL.   REFERRING PHYSICIAN:  Merlene Laughter. [name], M.D.   FINDINGS:  Extensive polypoid disease filling the bilateral nasal cavities  arising from the middle and superior meatus bilaterally.  Thickening and  lateralization of the middle turbinates bilaterally.  Extensive polypoid  disease throughout all ethmoid and frontal and sphenoid sinuses.  Very  thick, inspissated mucus contained within the maxillary sinuses bilaterally  as well as polypoid disease.   INDICATIONS FOR PROCEDURE:  The patient is a 70 year old lady with  many year  history of chronic sinusitis and nasal obstruction who has failed medical  therapy.  The risks, benefits, alternatives and complications of the  procedure were explained to the patient, who seemed to understand and agreed  to surgery.   DESCRIPTION OF PROCEDURE:  The patient was taken to the operating room and  placed on the operating table in the supine position.  Following induction  of general endotracheal anesthesia, the patient was prepped and draped in  standard fashion.  Oxymetazoline spray was used preoperatively in the nasal  cavities. The InstaTrak head gear was placed into position. The straight  suction probe was calibrated and positioning was verified.   (1)  Bilateral endoscopic polypectomy, extensive.  The 0 degree nasal  endoscope and the microdebrider were used to remove a large amount of  polypoid tissue from the nasal cavities, the middle meatus and the superior  meatus bilaterally.  Prior to the polyp debridement, the middle turbinates  were not visible at all.  Xylocaine with epinephrine was infiltrated into  the large mass of polyps initially and then the microdebrider was used to  clean all the way back to  the ethmoid bulla clearing up the posterior nasal  cavities and the superior meatus bilaterally.  (2)  Bilateral endoscopic total ethmoidectomy.  After the polypectomy was  performed, 1% Xylocaine with epinephrine was infiltrated in the office the  superior and posterior attachments of the middle turbinates and the lateral  nasal wall bilaterally.  The uncinate process was taken down bilaterally  using a sickle knife.  The microdebrider was then used to perform a complete  ethmoidectomy on both sides.  The lamina papyracea was kept intact  bilaterally, marked the lateral limit of the dissection.  The roof of the  ethmoid was also kept intact and marked the superior limit of dissection.  Posteriorly, the ground lamella was dissected to gain access to  the  posterior cell and all the way back to the face of the sphenoid.  (3) Bilateral endoscopic maxillary antrostomy.  The 30 degree endoscope was  used to visualize the middle meatus and the natural ostium of the maxillary  sinuses was opened bilaterally and enlarged with a backbiting forceps and  posteriorly using the microdebrider. A large amount of very thick secretions  was contained within the both sides and was suctioned out completely.  Large  polypoid masses were also removed from both sides.  (4) Bilateral endoscopic frontal sinusotomies.  After the ethmoidectomy was  completed, the frontal ethmoid recess was examined and cleaned of polypoid  tissue.  A curved suction was used to enter into the frontal sinus.  Large  polypoid debris was cleaned out bilaterally.  Giraffe forceps were used to  remove diseased mucosa around the opening and to leave the opening as wide  open as possible.  (5) Bilateral endoscopic sphenoidotomy.  After the ethmoid dissection was  completed, the face of the sphenoid was identified an was opened with a  straight suction.  Large polyp was removed from the right side and thick  mucus was contained within the left side.  Kerrison rongeur was used to open  the sphenoid ostium medially and inferiorly.  A small bleeder was  encountered on the left side.  This was controlled using topical Afrin on  pledgets.  (6) Bilateral middle turbinate resection.  Straight through-cut forceps were  used to excise the anterior and inferior aspects of the middle turbinates  leaving the superior stump for future anatomic visualization if further  surgery may be needed.  (7) InstaTrak image guided system used.  Given the extensive polypoid  disease, the InstaTrak was useful in identifying the frontal recess and the  opening into the frontal sinus bilaterally.  It was also very useful  in  identifying the face and the back side of the sphenoid sinuses.  It was assistance in  identifying the superior limit and lateral limit of dissection  of the ethmoids bilaterally.  At the termination of all the surgery, the  nasal cavities were suctioned of  blood and secretions.  The ethmoid cavities were packed with Kyung Rudd packs  inflated with local anesthetic solution and the nasal cavities were packed  with rolled up Telfa coated with bacitracin.  The pharynx was suctioned of  blood and secretions under direct visualization.  The patient was then  awakened, extubated and transferred to recovery.      Jefr   JHR/MEDQ  D:  05/16/2004  T:  05/16/2004  Job:  [id number]   cc:   Merlene Laughter. [name], M.D.  [address]  [address]  [place]  [place] [address]  Fax: [phone]

## 2011-01-06 ENCOUNTER — Other Ambulatory Visit (HOSPITAL_COMMUNITY): Payer: Self-pay | Admitting: Obstetrics and Gynecology

## 2011-01-06 DIAGNOSIS — Z1231 Encounter for screening mammogram for malignant neoplasm of breast: Secondary | ICD-10-CM

## 2011-02-07 ENCOUNTER — Ambulatory Visit (HOSPITAL_COMMUNITY)
Admission: RE | Admit: 2011-02-07 | Discharge: 2011-02-07 | Disposition: A | Discharge status: Home / Self Care | Payer: 59 | Source: Ambulatory Visit | Attending: Obstetrics and Gynecology | Admitting: Obstetrics and Gynecology

## 2011-02-07 DIAGNOSIS — Z1231 Encounter for screening mammogram for malignant neoplasm of breast: Secondary | ICD-10-CM | POA: Insufficient documentation

## 2011-04-13 HISTORY — PX: NM MYOCAR PERF WALL MOTION: HXRAD629

## 2011-11-13 ENCOUNTER — Other Ambulatory Visit (HOSPITAL_COMMUNITY): Payer: Self-pay | Admitting: Obstetrics and Gynecology

## 2011-11-13 DIAGNOSIS — Z1231 Encounter for screening mammogram for malignant neoplasm of breast: Secondary | ICD-10-CM

## 2012-02-08 ENCOUNTER — Ambulatory Visit (HOSPITAL_COMMUNITY)
Admission: RE | Admit: 2012-02-08 | Discharge: 2012-02-08 | Disposition: A | Discharge status: Home / Self Care | Payer: Medicare HMO | Source: Ambulatory Visit | Attending: Obstetrics and Gynecology | Admitting: Obstetrics and Gynecology

## 2012-02-08 DIAGNOSIS — Z1231 Encounter for screening mammogram for malignant neoplasm of breast: Secondary | ICD-10-CM | POA: Insufficient documentation

## 2012-03-21 ENCOUNTER — Other Ambulatory Visit: Payer: Self-pay | Admitting: Internal Medicine

## 2012-03-21 DIAGNOSIS — M899 Disorder of bone, unspecified: Secondary | ICD-10-CM

## 2012-06-12 HISTORY — PX: ESOPHAGEAL DILATION: SHX303

## 2012-09-23 ENCOUNTER — Telehealth: Payer: Self-pay | Admitting: Gastroenterology

## 2012-09-23 ENCOUNTER — Encounter: Payer: Self-pay | Admitting: Gastroenterology

## 2012-09-23 ENCOUNTER — Ambulatory Visit (INDEPENDENT_AMBULATORY_CARE_PROVIDER_SITE_OTHER): Payer: Medicare HMO | Admitting: Gastroenterology

## 2012-09-23 VITALS — BP 136/78 | HR 72 | Ht 65.16 in | Wt 169.2 lb

## 2012-09-23 DIAGNOSIS — K3189 Other diseases of stomach and duodenum: Secondary | ICD-10-CM

## 2012-09-23 DIAGNOSIS — R1013 Epigastric pain: Secondary | ICD-10-CM

## 2012-09-23 DIAGNOSIS — R1310 Dysphagia, unspecified: Secondary | ICD-10-CM

## 2012-09-23 MED ORDER — ESOMEPRAZOLE MAGNESIUM 40 MG PO CPDR: 40.0000 mg | DELAYED_RELEASE_CAPSULE | Freq: Every day | ORAL | Status: DC

## 2012-09-23 NOTE — Progress Notes (Signed)
History of Present Illness: Megan Hull has returned for evaluation of abdominal bloating and discomfort. For the past month she's been complaining of postprandial bloating.  Denies early satiety, pyrosis, nausea or vomiting, or dysphagia. There has been no change in bowel habits. She also complains of pain in the right lower quadrant that radiates around to her buttocks. Bloating and abdominal discomfort are relieved by passing gas or by belching. Weight has been stable. She denies melena or hematochezia. She's on no gastric irritants including nonsteroidals. 2011 colonoscopy demonstrated diverticulosis in the sigmoid colon, and scattered diverticula throughout the remainder of the colon.    Past Medical History  Diagnosis Date  . Hypertension   . HLD (hyperlipidemia)   . Diverticulosis    Past Surgical History  Procedure Laterality Date  . Infected lymph node surgery    . Tonsillectomy     family history includes Alzheimer's disease in her sister; Esophageal cancer in her brother; and Heart disease in her brother, father, mother, and sister. Current Outpatient Prescriptions  Medication Sig Dispense Refill  . aspirin 81 MG tablet Take 162 mg by mouth daily.      . metoprolol tartrate (LOPRESSOR) 25 MG tablet Take 25 mg by mouth daily.      . potassium chloride SA (K-DUR,KLOR-CON) 20 MEQ tablet Take 20 mEq by mouth daily.      . simvastatin (ZOCOR) 40 MG tablet Take 40 mg by mouth daily.      . valsartan-hydrochlorothiazide (DIOVAN-HCT) 80-12.5 MG per tablet Take 1 tablet by mouth daily.       No current facility-administered medications for this visit.   Allergies as of 09/23/2012 - Review Complete 09/23/2012  Allergen Reaction Noted  . Codeine      reports that she has been smoking Cigarettes.  She has a 4.5 pack-year smoking history. She has never used smokeless tobacco. She reports that  drinks alcohol. She reports that she does not use illicit drugs.     Review of Systems:  Pertinent positive and negative review of systems were noted in the above HPI section. All other review of systems were otherwise negative.  Vital signs were reviewed in today's medical record Physical Exam: General: Well developed , well nourished, no acute distress Skin: anicteric Head: Normocephalic and atraumatic Eyes:  sclerae anicteric, EOMI Ears: Normal auditory acuity Mouth: No deformity or lesions Neck: Supple, no masses or thyromegaly Lungs: Clear throughout to auscultation Heart: Regular rate and rhythm; no murmurs, rubs or bruits Abdomen: Soft,and non distended. No masses, hepatosplenomegaly or hernias noted. Normal Bowel sounds. There is no succussion splash. There is mild tenderness in the right lower quadrant to palpation without guarding or rebound Rectal:deferred Musculoskeletal: Symmetrical with no gross deformities  Skin: No lesions on visible extremities Pulses:  Normal pulses noted Extremities: No clubbing, cyanosis, edema or deformities noted Neurological: Alert oriented x 4, grossly nonfocal Cervical Nodes:  No significant cervical adenopathy Inguinal Nodes: No significant inguinal adenopathy Psychological:  Alert and cooperative. Normal mood and affect

## 2012-09-23 NOTE — Assessment & Plan Note (Signed)
Patient has nonspecific dyspepsia characterized by postprandial bloating and gas. Symptoms could be do to ulcer or nonulcer dyspepsia. No evidence for on physical exam for abdominal masses or ascites.  Recommendations #1 trial of Nexium 40 mg daily. If not improved will consider upper endoscopy and checking blood work

## 2012-09-23 NOTE — Telephone Encounter (Signed)
Patient states she has had bloating and gas for a month. Last week, she started having abdominal pain below the belly button that radiates to back. The pain is getting worse. She cannot sleep. Scheduled patient today with Arlyce Dice today at 1:45 PM.

## 2012-09-23 NOTE — Patient Instructions (Addendum)
You have been given a separate informational sheet regarding your tobacco use, the importance of quitting and local resources to help you quit. We are giving you Nexium Samples today Call back in 4 days to report your progress

## 2012-09-23 NOTE — Assessment & Plan Note (Signed)
Resolved following esophageal dilatation several years ago

## 2012-09-26 ENCOUNTER — Telehealth: Payer: Self-pay | Admitting: Gastroenterology

## 2012-09-26 NOTE — Telephone Encounter (Signed)
Pt states she was to call with an update today. She is in [place] and will be home in a couple of weeks. She saw Dr Arlyce Dice on 09/23/12 and was given 20 Nexium  Capsules . She reports her bloating has greatly improved, still has gas, but passes it. She remains tender on the r side, but she attributes that to the bloating. All her back pain has resolved. She has enough Nexium for her stay and will call for further problems.

## 2012-12-20 ENCOUNTER — Other Ambulatory Visit (HOSPITAL_COMMUNITY): Payer: Self-pay | Admitting: Obstetrics and Gynecology

## 2012-12-20 DIAGNOSIS — Z1231 Encounter for screening mammogram for malignant neoplasm of breast: Secondary | ICD-10-CM

## 2013-01-01 ENCOUNTER — Encounter: Payer: Self-pay | Admitting: Gastroenterology

## 2013-01-01 ENCOUNTER — Ambulatory Visit (INDEPENDENT_AMBULATORY_CARE_PROVIDER_SITE_OTHER): Payer: Medicare HMO | Admitting: Gastroenterology

## 2013-01-01 VITALS — BP 110/70 | HR 66 | Ht 65.0 in | Wt 173.6 lb

## 2013-01-01 DIAGNOSIS — R1310 Dysphagia, unspecified: Secondary | ICD-10-CM

## 2013-01-01 DIAGNOSIS — R1319 Other dysphagia: Secondary | ICD-10-CM

## 2013-01-01 DIAGNOSIS — K219 Gastro-esophageal reflux disease without esophagitis: Secondary | ICD-10-CM

## 2013-01-01 MED ORDER — ESOMEPRAZOLE MAGNESIUM 40 MG PO CPDR: DELAYED_RELEASE_CAPSULE | ORAL | Status: DC

## 2013-01-01 NOTE — Patient Instructions (Addendum)

## 2013-01-01 NOTE — Assessment & Plan Note (Signed)
Postprandial abdominal and chest discomfort is probably related to acid reflux.  Conditions #1 increase Nexium to every morning and before dinner

## 2013-01-01 NOTE — Assessment & Plan Note (Signed)
Plan endoscopy to rule out a distal esophageal stricture

## 2013-01-01 NOTE — Progress Notes (Signed)
History of Present Illness: The patient has returned for evaluation of chest discomfort and dysphagia. She's experiencing dysphagia to solids. Within an hour postprandially she may have chest discomfort radiating to the neck and jaw. Symptoms are especially severe in the evenings. She remains on Nexium every morning. In 2011 she underwent dilatation for complaints of dysphagia. No stricture, per se was seen but there was mild resistance to the dilator. Her prior complaints of postprandial bloating has subsided.    Past Medical History  Diagnosis Date  . Hypertension   . HLD (hyperlipidemia)   . Diverticulosis    Past Surgical History  Procedure Laterality Date  . Infected lymph node surgery    . Tonsillectomy     family history includes Alzheimer's disease in her sister; Esophageal cancer in her brother; and Heart disease in her brother, father, mother, and sister. Current Outpatient Prescriptions  Medication Sig Dispense Refill  . aspirin 81 MG tablet Take 162 mg by mouth daily.      [name] esomeprazole (NEXIUM) 40 MG capsule Take 1 capsule (40 mg total) by mouth daily before breakfast.  30 capsule  1  . Magnesium 400 MG CAPS Take by mouth. Every other day      . metoprolol tartrate (LOPRESSOR) 25 MG tablet Take 25 mg by mouth daily.      . potassium chloride SA (K-DUR,KLOR-CON) 20 MEQ tablet Take 20 mEq by mouth daily.      . simvastatin (ZOCOR) 40 MG tablet Take 40 mg by mouth daily.      . valsartan-hydrochlorothiazide (DIOVAN-HCT) 80-12.5 MG per tablet Take 1 tablet by mouth daily.       No current facility-administered medications for this visit.   Allergies as of 01/01/2013 - Review Complete 01/01/2013  Allergen Reaction Noted  . Codeine      reports that she has been smoking Cigarettes.  She has a 4.5 pack-year smoking history. She has never used smokeless tobacco. She reports that  drinks alcohol. She reports that she does not use illicit drugs.     Review of Systems:  Pertinent positive and negative review of systems were noted in the above HPI section. All other review of systems were otherwise negative.  Vital signs were reviewed in today's medical record Physical Exam: General: Well developed , well nourished, no acute distress Skin: anicteric Head: Normocephalic and atraumatic Eyes:  sclerae anicteric, EOMI Ears: Normal auditory acuity Mouth: No deformity or lesions Neck: Supple, no masses or thyromegaly Lungs: Clear throughout to auscultation Heart: Regular rate and rhythm; no murmurs, rubs or bruits Abdomen: Soft, non tender and non distended. No masses, hepatosplenomegaly or hernias noted. Normal Bowel sounds Rectal:deferred Musculoskeletal: Symmetrical with no gross deformities  Skin: No lesions on visible extremities Pulses:  Normal pulses noted Extremities: No clubbing, cyanosis, edema or deformities noted Neurological: Alert oriented x 4, grossly nonfocal Cervical Nodes:  No significant cervical adenopathy Inguinal Nodes: No significant inguinal adenopathy Psychological:  Alert and cooperative. Normal mood and affect

## 2013-01-07 ENCOUNTER — Encounter: Payer: Self-pay | Admitting: Gastroenterology

## 2013-01-07 ENCOUNTER — Telehealth: Payer: Self-pay | Admitting: Gastroenterology

## 2013-01-07 ENCOUNTER — Ambulatory Visit (AMBULATORY_SURGERY_CENTER): Payer: Medicare HMO | Admitting: Gastroenterology

## 2013-01-07 VITALS — BP 146/72 | HR 68 | Temp 97.5°F | Resp 19 | Ht 65.0 in | Wt 173.0 lb

## 2013-01-07 DIAGNOSIS — K208 Other esophagitis without bleeding: Secondary | ICD-10-CM

## 2013-01-07 DIAGNOSIS — K222 Esophageal obstruction: Secondary | ICD-10-CM

## 2013-01-07 DIAGNOSIS — R1319 Other dysphagia: Secondary | ICD-10-CM

## 2013-01-07 MED ORDER — SODIUM CHLORIDE 0.9 % IV SOLN: 500.0000 mL | INTRAVENOUS | Status: DC

## 2013-01-07 NOTE — Progress Notes (Signed)
Lidocaine-40mg IV prior to Propofol InductionPropofol given over incremental dosages 

## 2013-01-07 NOTE — Patient Instructions (Addendum)
YOU HAD AN ENDOSCOPIC PROCEDURE TODAY AT THE [facility]: Refer to the procedure report that was given to you for any specific questions about what was found during the examination.  If the procedure report does not answer your questions, please call your gastroenterologist to clarify.  If you requested that your care partner not be given the details of your procedure findings, then the procedure report has been included in a sealed envelope for you to review at your convenience later.  YOU SHOULD EXPECT: Some feelings of bloating in the abdomen. Passage of more gas than usual.  Walking can help get rid of the air that was put into your GI tract during the procedure and reduce the bloating. If you had a lower endoscopy (such as a colonoscopy or flexible sigmoidoscopy) you may notice spotting of blood in your stool or on the toilet paper. If you underwent a bowel prep for your procedure, then you may not have a normal bowel movement for a few days.  DIET:   Drink plenty of fluids but you should avoid alcoholic beverages for 24 hours.  Please follow the dilatation diet the rest of the day.  ACTIVITY: Your care partner should take you home directly after the procedure.  You should plan to take it easy, moving slowly for the rest of the day.  You can resume normal activity the day after the procedure however you should NOT DRIVE or use heavy machinery for 24 hours (because of the sedation medicines used during the test).    SYMPTOMS TO REPORT IMMEDIATELY: A gastroenterologist can be reached at any hour.  During normal business hours, 8:30 AM to 5:00 PM Monday through Friday, call [phone].  After hours and on weekends, please call the GI answering service at [phone] who will take a message and have the physician on call contact you.     Following upper endoscopy (EGD)  Vomiting of blood or coffee ground material  New chest pain or pain under the shoulder blades  Painful or  persistently difficult swallowing  New shortness of breath  Fever of 100F or higher  Black, tarry-looking stools  FOLLOW UP: If any biopsies were taken you will be contacted by phone or by letter within the next 1-3 weeks.  Call your gastroenterologist if you have not heard about the biopsies in 3 weeks.  Our staff will call the home number listed on your records the next business day following your procedure to check on you and address any questions or concerns that you may have at that time regarding the information given to you following your procedure. This is a courtesy call and so if there is no answer at the home number and we have not heard from you through the emergency physician on call, we will assume that you have returned to your regular daily activities without incident.  SIGNATURES/CONFIDENTIALITY: You and/or your care partner have signed paperwork which will be entered into your electronic medical record.  These signatures attest to the fact that that the information above on your After Visit Summary has been reviewed and is understood.  Full responsibility of the confidentiality of this discharge information lies with you and/or your care-partner.    A handout was given to your care partner on a dilatation diet to follow the rest of the day. A rx was sent to [facility] for Zegrid 40 mg 2 x daily.  You may resume your other current medications today. Please call if any  questions or concerns.

## 2013-01-07 NOTE — Telephone Encounter (Signed)
Medication approved till 01/07/2014

## 2013-01-07 NOTE — Op Note (Signed)
[facility] [address] [place], [address]   ENDOSCOPY PROCEDURE REPORT  PATIENT: Vaishnavi, Dalby  MR#: [id number] BIRTHDATE: 1941-05-11 , 71  yrs. old GENDER: Female ENDOSCOPIST: Louis Meckel, MD REFERRED BY:  Kellie Shropshire, M.D. PROCEDURE DATE:  01/07/2013 PROCEDURE:  EGD, diagnostic and Maloney dilation of esophagus ASA CLASS:     Class II INDICATIONS:  Dysphagia.   Chest pain. MEDICATIONS: MAC sedation, administered by CRNA, propofol (Diprivan) 100mg  IV, and Simethicone 0.6cc PO TOPICAL ANESTHETIC:  DESCRIPTION OF PROCEDURE: After the risks benefits and alternatives of the procedure were thoroughly explained, informed consent was obtained.  The LB [id number] endoscope was introduced through the mouth and advanced to the third portion of the duodenum. Without limitations.  The instrument was slowly withdrawn as the mucosa was fully examined.      An early esophageal stricture was present.  There was moderate edema and slight edema at the GE junction.  aa #52 Jerene Dilling dilator was passed with mild resistance.  There was no heme.   The remainder of the upper endoscopy exam was otherwise normal. Retroflexed views revealed no abnormalities.     The scope was then withdrawn from the patient and the procedure completed.  COMPLICATIONS: There were no complications. ENDOSCOPIC IMPRESSION: 1.   early esophageal stricture 2.   esophagitis  RECOMMENDATIONS: Zegerid 40 mg twice a day Office visit one month  REPEAT EXAM:  eSigned:  Louis Meckel, MD 01/07/2013 10:12 AM   CC:

## 2013-01-07 NOTE — Progress Notes (Signed)
No complaints noted in the recovery room. Maw   

## 2013-01-07 NOTE — Progress Notes (Signed)
Called to room to assist during endoscopic procedure.  Patient ID and intended procedure confirmed with present staff. Received instructions for my participation in the procedure from the performing physician.  

## 2013-01-08 ENCOUNTER — Telehealth: Payer: Self-pay | Admitting: *Deleted

## 2013-01-08 NOTE — Telephone Encounter (Signed)
  Follow up Call-  Call back number 01/07/2013  Post procedure Call Back phone  # [phone]  Permission to leave phone message Yes     Patient questions:  Do you have a fever, pain , or abdominal swelling? no Pain Score  0 *  Have you tolerated food without any problems? yes  Have you been able to return to your normal activities? yes  Do you have any questions about your discharge instructions: Diet   no Medications  no Follow up visit  no  Do you have questions or concerns about your Care? no  Actions: * If pain score is 4 or above: No action needed, pain <4.

## 2013-01-13 ENCOUNTER — Telehealth: Payer: Self-pay | Admitting: Gastroenterology

## 2013-01-13 NOTE — Telephone Encounter (Signed)
Pt states she has been having diarrhea since Thursday. Instructed pt to take Imodium for the diarrhea and to call us back if no improvement. Pt also instructed to drink lots of fluids. Pt verbalized understanding.

## 2013-01-13 NOTE — Telephone Encounter (Signed)
On call note at 2100. Pt notes difficulty swallowing and mucous in her throat tonight. She noted diarrhea earlier today and took and Imodium. Had trouble swallowing a Nexium as throat symptoms started. Had EGD by [facility] last week with an early stricture, mild esophagitis and a Maloney dilation was performed. She felt well until today. No throat, neck or chest pain. Medications lists KCl but says she did not take any today or yesterday. Advised to suck on sore throat lozenges and nd ice chips tonight, but  otherwise remain NPO. May try water if symptoms improve tonight. If symptoms are not markedly improved by tomorrow she will need office evaluation. If symptoms worsen she will go to ED tonight.

## 2013-01-14 ENCOUNTER — Other Ambulatory Visit: Payer: Self-pay

## 2013-01-14 ENCOUNTER — Telehealth: Payer: Self-pay | Admitting: Gastroenterology

## 2013-01-14 NOTE — Telephone Encounter (Signed)
Pt states that when she took her nexium last night she had difficulty swallowing. Pt thinks she may have had a reaction to the nexium, states it happended one other time also. Pt is swallowing fine now. States she will try taking zegerid. Pt to call if she has any problems with the zegerid.

## 2013-01-24 ENCOUNTER — Telehealth: Payer: Self-pay | Admitting: Gastroenterology

## 2013-01-24 NOTE — Telephone Encounter (Signed)
Pt states she was taking Imodium and stopped it, states that the diarrhea came back. Pt reports that she usually has it in the am after she eats. Pt instructed to resume the imodium and to call us back if it did not work. Pt already has OV scheduled. Pt verbalized understanding.

## 2013-02-11 ENCOUNTER — Ambulatory Visit: Payer: Medicare HMO | Admitting: Gastroenterology

## 2013-02-11 ENCOUNTER — Ambulatory Visit (HOSPITAL_COMMUNITY): Payer: Medicare HMO

## 2013-02-12 ENCOUNTER — Ambulatory Visit (INDEPENDENT_AMBULATORY_CARE_PROVIDER_SITE_OTHER): Payer: Medicare HMO | Admitting: Gastroenterology

## 2013-02-12 ENCOUNTER — Encounter: Payer: Self-pay | Admitting: Gastroenterology

## 2013-02-12 VITALS — BP 120/74 | HR 88 | Ht 65.0 in | Wt 170.0 lb

## 2013-02-12 DIAGNOSIS — K219 Gastro-esophageal reflux disease without esophagitis: Secondary | ICD-10-CM

## 2013-02-12 DIAGNOSIS — R1319 Other dysphagia: Secondary | ICD-10-CM

## 2013-02-12 DIAGNOSIS — R1310 Dysphagia, unspecified: Secondary | ICD-10-CM

## 2013-02-12 DIAGNOSIS — R197 Diarrhea, unspecified: Secondary | ICD-10-CM | POA: Insufficient documentation

## 2013-02-12 NOTE — Progress Notes (Signed)
History of Present Illness:  The patient has returned following upper endoscopy with dilatation of a distal esophageal stricture.  She continues to complain of very intermittent inability to swallow liquids or solids.  She feels that these get stuck very high in her chest and she is forced to vomit.  In between she has no difficulties.  She suffers from intermittent pyrosis.  Zegerid and Nexium cause diarrhea.  She has pyrosis approximately twice a week for which she takes antacids with relief.    Review of Systems: Pertinent positive and negative review of systems were noted in the above HPI section. All other review of systems were otherwise negative.    Current Medications, Allergies, Past Medical History, Past Surgical History, Family History and Social History were reviewed in [employer] electronic medical record  Vital signs were reviewed in today's medical record. Physical Exam: General: Well developed , well nourished, no acute distress

## 2013-02-12 NOTE — Patient Instructions (Addendum)
You have been scheduled for a Barium Esophogram at [facility] (1st floor of the hospital) on 02/14/2013 at 9:30am. Please arrive 15 minutes prior to your appointment for registration. Make certain not to have anything to eat or drink 6 hours prior to your test. If you need to reschedule for any reason, please contact radiology at [phone] to do so. __________________________________________________________________ A barium swallow is an examination that concentrates on views of the esophagus. This tends to be a double contrast exam (barium and two liquids which, when combined, create a gas to distend the wall of the oesophagus) or single contrast (non-ionic iodine based). The study is usually tailored to your symptoms so a good history is essential. Attention is paid during the study to the form, structure and configuration of the esophagus, looking for functional disorders (such as aspiration, dysphagia, achalasia, motility and reflux) EXAMINATION You may be asked to change into a gown, depending on the type of swallow being performed. A radiologist and radiographer will perform the procedure. The radiologist will advise you of the type of contrast selected for your procedure and direct you during the exam. You will be asked to stand, sit or lie in several different positions and to hold a small amount of fluid in your mouth before being asked to swallow while the imaging is performed .In some instances you may be asked to swallow barium coated marshmallows to assess the motility of a solid food bolus. The exam can be recorded as a digital or video fluoroscopy procedure. POST PROCEDURE It will take 1-2 days for the barium to pass through your system. To facilitate this, it is important, unless otherwise directed, to increase your fluids for the next 24-48hrs and to resume your normal diet.  This test typically takes about 30 minutes to  perform. __________________________________________________________________________________

## 2013-02-12 NOTE — Assessment & Plan Note (Signed)
Diarrhea appears to be related to PPI therapy.  2006 colonoscopy demonstrated hemorrhoids and diverticulosis.  Recommendations #1 avoid PPI therapy if possible  #2 check stool Hemoccults

## 2013-02-12 NOTE — Assessment & Plan Note (Addendum)
Although the patient had an early distal esophageal stricture which was dilated,  she continues to have intermittent episodes of complete inability to pass liquids and solids.  This raises the question of a Zenker's diverticulum or a motility disorder.  Cricopharyngeal achalasia may also be considered although I would expect symptoms from this to be more chronic than intermittent.  Recommendations #1 barium swallow

## 2013-02-12 NOTE — Assessment & Plan Note (Signed)
Patient is only milldly symptomatic and symptoms are well controlled with antacids.  Plan to continue with the same.

## 2013-02-13 ENCOUNTER — Other Ambulatory Visit (HOSPITAL_COMMUNITY): Payer: Self-pay | Admitting: Obstetrics and Gynecology

## 2013-02-13 ENCOUNTER — Ambulatory Visit (HOSPITAL_COMMUNITY)
Admission: RE | Admit: 2013-02-13 | Discharge: 2013-02-13 | Disposition: A | Discharge status: Home / Self Care | Payer: Medicare HMO | Source: Ambulatory Visit | Attending: Obstetrics and Gynecology | Admitting: Obstetrics and Gynecology

## 2013-02-13 ENCOUNTER — Other Ambulatory Visit (INDEPENDENT_AMBULATORY_CARE_PROVIDER_SITE_OTHER): Payer: Medicare HMO

## 2013-02-13 DIAGNOSIS — R197 Diarrhea, unspecified: Secondary | ICD-10-CM

## 2013-02-13 DIAGNOSIS — K219 Gastro-esophageal reflux disease without esophagitis: Secondary | ICD-10-CM

## 2013-02-13 DIAGNOSIS — R1310 Dysphagia, unspecified: Secondary | ICD-10-CM

## 2013-02-13 DIAGNOSIS — Z1231 Encounter for screening mammogram for malignant neoplasm of breast: Secondary | ICD-10-CM

## 2013-02-13 DIAGNOSIS — R1319 Other dysphagia: Secondary | ICD-10-CM

## 2013-02-14 ENCOUNTER — Other Ambulatory Visit: Payer: Self-pay | Admitting: Gastroenterology

## 2013-02-14 ENCOUNTER — Ambulatory Visit (HOSPITAL_COMMUNITY)
Admission: RE | Admit: 2013-02-14 | Discharge: 2013-02-14 | Disposition: A | Discharge status: Home / Self Care | Payer: Medicare HMO | Source: Ambulatory Visit | Attending: Gastroenterology | Admitting: Gastroenterology

## 2013-02-14 DIAGNOSIS — K449 Diaphragmatic hernia without obstruction or gangrene: Secondary | ICD-10-CM | POA: Insufficient documentation

## 2013-02-14 DIAGNOSIS — R1319 Other dysphagia: Secondary | ICD-10-CM

## 2013-02-14 DIAGNOSIS — M47812 Spondylosis without myelopathy or radiculopathy, cervical region: Secondary | ICD-10-CM | POA: Insufficient documentation

## 2013-02-14 DIAGNOSIS — K921 Melena: Secondary | ICD-10-CM

## 2013-02-14 DIAGNOSIS — R1310 Dysphagia, unspecified: Secondary | ICD-10-CM | POA: Insufficient documentation

## 2013-02-14 DIAGNOSIS — K222 Esophageal obstruction: Secondary | ICD-10-CM | POA: Insufficient documentation

## 2013-02-18 ENCOUNTER — Other Ambulatory Visit (INDEPENDENT_AMBULATORY_CARE_PROVIDER_SITE_OTHER): Payer: Medicare HMO

## 2013-02-18 ENCOUNTER — Ambulatory Visit (AMBULATORY_SURGERY_CENTER): Payer: Medicare HMO

## 2013-02-18 VITALS — Ht 65.0 in | Wt 171.0 lb

## 2013-02-18 DIAGNOSIS — K921 Melena: Secondary | ICD-10-CM

## 2013-02-18 DIAGNOSIS — R197 Diarrhea, unspecified: Secondary | ICD-10-CM

## 2013-02-18 DIAGNOSIS — R195 Other fecal abnormalities: Secondary | ICD-10-CM

## 2013-02-18 LAB — COMPREHENSIVE METABOLIC PANEL
Alkaline Phosphatase: 63 U/L (ref 39–117)
BUN: 12 mg/dL (ref 6–23)
Creatinine, Ser: 1 mg/dL (ref 0.4–1.2)
Glucose, Bld: 83 mg/dL (ref 70–99)
Total Bilirubin: 0.6 mg/dL (ref 0.3–1.2)

## 2013-02-18 LAB — CBC WITH DIFFERENTIAL/PLATELET
Basophils Relative: 1.2 % (ref 0.0–3.0)
Eosinophils Absolute: 0.6 10*3/uL (ref 0.0–0.7)
Eosinophils Relative: 11.7 % — ABNORMAL HIGH (ref 0.0–5.0)
HCT: 41.8 % (ref 36.0–46.0)
Lymphs Abs: 2.2 10*3/uL (ref 0.7–4.0)
MCHC: 33.9 g/dL (ref 30.0–36.0)
MCV: 90.3 fl (ref 78.0–100.0)
Monocytes Absolute: 0.3 10*3/uL (ref 0.1–1.0)
RBC: 4.63 Mil/uL (ref 3.87–5.11)
WBC: 4.8 10*3/uL (ref 4.5–10.5)

## 2013-02-18 MED ORDER — NA SULFATE-K SULFATE-MG SULF 17.5-3.13-1.6 GM/177ML PO SOLN: 1.0000 | Freq: Once | ORAL | Status: DC

## 2013-02-19 ENCOUNTER — Telehealth: Payer: Self-pay | Admitting: Gastroenterology

## 2013-02-19 NOTE — Telephone Encounter (Signed)
Called [facility] they just filled Suprep 20 minutes ago. Called pt to inform

## 2013-02-24 ENCOUNTER — Encounter: Payer: Self-pay | Admitting: Gastroenterology

## 2013-02-27 ENCOUNTER — Ambulatory Visit (AMBULATORY_SURGERY_CENTER): Payer: Medicare HMO | Admitting: Gastroenterology

## 2013-02-27 ENCOUNTER — Encounter: Payer: Self-pay | Admitting: Gastroenterology

## 2013-02-27 VITALS — BP 138/75 | HR 63 | Temp 98.0°F | Resp 20 | Ht 65.0 in | Wt 171.0 lb

## 2013-02-27 DIAGNOSIS — D126 Benign neoplasm of colon, unspecified: Secondary | ICD-10-CM

## 2013-02-27 DIAGNOSIS — K5730 Diverticulosis of large intestine without perforation or abscess without bleeding: Secondary | ICD-10-CM

## 2013-02-27 DIAGNOSIS — R197 Diarrhea, unspecified: Secondary | ICD-10-CM

## 2013-02-27 DIAGNOSIS — R195 Other fecal abnormalities: Secondary | ICD-10-CM

## 2013-02-27 MED ORDER — SODIUM CHLORIDE 0.9 % IV SOLN: 500.0000 mL | INTRAVENOUS | Status: DC

## 2013-02-27 NOTE — Progress Notes (Signed)
Stable to RR 

## 2013-02-27 NOTE — Patient Instructions (Addendum)
Discharge instructions given with verbal understanding. Handouts on diverticulosis and a high fiber diet given. Biopsies taken. hemeoccults cards given in recovery. Resume previous medications. YOU HAD AN ENDOSCOPIC PROCEDURE TODAY AT THE [facility]: Refer to the procedure report that was given to you for any specific questions about what was found during the examination.  If the procedure report does not answer your questions, please call your gastroenterologist to clarify.  If you requested that your care partner not be given the details of your procedure findings, then the procedure report has been included in a sealed envelope for you to review at your convenience later.  YOU SHOULD EXPECT: Some feelings of bloating in the abdomen. Passage of more gas than usual.  Walking can help get rid of the air that was put into your GI tract during the procedure and reduce the bloating. If you had a lower endoscopy (such as a colonoscopy or flexible sigmoidoscopy) you may notice spotting of blood in your stool or on the toilet paper. If you underwent a bowel prep for your procedure, then you may not have a normal bowel movement for a few days.  DIET: Your first meal following the procedure should be a light meal and then it is ok to progress to your normal diet.  A half-sandwich or bowl of soup is an example of a good first meal.  Heavy or fried foods are harder to digest and may make you feel nauseous or bloated.  Likewise meals heavy in dairy and vegetables can cause extra gas to form and this can also increase the bloating.  Drink plenty of fluids but you should avoid alcoholic beverages for 24 hours.  ACTIVITY: Your care partner should take you home directly after the procedure.  You should plan to take it easy, moving slowly for the rest of the day.  You can resume normal activity the day after the procedure however you should NOT DRIVE or use heavy machinery for 24 hours (because of the  sedation medicines used during the test).    SYMPTOMS TO REPORT IMMEDIATELY: A gastroenterologist can be reached at any hour.  During normal business hours, 8:30 AM to 5:00 PM Monday through Friday, call [phone].  After hours and on weekends, please call the GI answering service at [phone] who will take a message and have the physician on call contact you.   Following lower endoscopy (colonoscopy or flexible sigmoidoscopy):  Excessive amounts of blood in the stool  Significant tenderness or worsening of abdominal pains  Swelling of the abdomen that is new, acute  Fever of 100F or higher  FOLLOW UP: If any biopsies were taken you will be contacted by phone or by letter within the next 1-3 weeks.  Call your gastroenterologist if you have not heard about the biopsies in 3 weeks.  Our staff will call the home number listed on your records the next business day following your procedure to check on you and address any questions or concerns that you may have at that time regarding the information given to you following your procedure. This is a courtesy call and so if there is no answer at the home number and we have not heard from you through the emergency physician on call, we will assume that you have returned to your regular daily activities without incident.  SIGNATURES/CONFIDENTIALITY: You and/or your care partner have signed paperwork which will be entered into your electronic medical record.  These signatures attest to the  fact that that the information above on your After Visit Summary has been reviewed and is understood.  Full responsibility of the confidentiality of this discharge information lies with you and/or your care-partner.

## 2013-02-27 NOTE — Progress Notes (Signed)
Patient did not experience any of the following events: a burn prior to discharge; a fall within the facility; wrong site/side/patient/procedure/implant event; or a hospital transfer or hospital admission upon discharge from the facility. (G8907) Patient did not have preoperative order for IV antibiotic SSI prophylaxis. (G8918)  

## 2013-02-27 NOTE — Op Note (Signed)
[facility] [address] [place], [address]   COLONOSCOPY PROCEDURE REPORT  PATIENT: Megan Hull, Megan Hull  MR#: [id number] BIRTHDATE: 09-23-1940 , 72  yrs. old GENDER: Female ENDOSCOPIST: Louis Meckel, MD REFERRED Wyvonnia Dusky, M.D. PROCEDURE DATE:  02/27/2013 PROCEDURE:   Colonoscopy with biopsy First Screening Colonoscopy - Avg.  risk and is 50 yrs.  old or older - No.  Prior Negative Screening - Now for repeat screening. 10 or more years since last screening  History of Adenoma - Now for follow-up colonoscopy & has been > or = to 3 yrs.  N/A  Polyps Removed Today? No.  Recommend repeat exam, <10 yrs? No. ASA CLASS:   Class II INDICATIONS:heme-positive stool. MEDICATIONS: MAC sedation, administered by CRNA and propofol (Diprivan) 150mg  IV  DESCRIPTION OF PROCEDURE:   After the risks benefits and alternatives of the procedure were thoroughly explained, informed consent was obtained.  A digital rectal exam revealed no abnormalities of the rectum.   The LB [id number]  endoscope was introduced through the anus and advanced to the cecum, which was identified by both the appendix and ileocecal valve. No adverse events experienced.   The quality of the prep was excellent using Suprep  The instrument was then slowly withdrawn as the colon was fully examined.      COLON FINDINGS: There was mild scattered diverticulosis noted in the sigmoid colon, transverse colon, and descending colon.   The colon mucosa was otherwise normal. Random biopsies were taken throughout the colon to rule out microscopic colitis Retroflexed views revealed no abnormalities. The time to cecum=1 minutes 27 seconds. Withdrawal time=6 minutes 44 seconds.  The scope was withdrawn and the procedure completed. COMPLICATIONS: There were no complications.  ENDOSCOPIC IMPRESSION: 1.   There was mild diverticulosis noted in the sigmoid colon, transverse colon, and descending colon 2.    The colon mucosa was otherwise normal  RECOMMENDATIONS: 1.  Await biopsy results 2.  Followup hemeoccults in 7-10 days   eSigned:  Louis Meckel, MD 02/27/2013 3:37 PM   cc:   PATIENT NAME:  Megan Hull, Megan Hull MR#: [id number]

## 2013-02-27 NOTE — Progress Notes (Signed)
Called to room to assist during endoscopic procedure.  Patient ID and intended procedure confirmed with present staff. Received instructions for my participation in the procedure from the performing physician.  

## 2013-02-28 ENCOUNTER — Telehealth: Payer: Self-pay

## 2013-02-28 NOTE — Telephone Encounter (Signed)
  Follow up Call-  Call back number 02/27/2013 01/07/2013  Post procedure Call Back phone  # [phone]  Permission to leave phone message Yes Yes     Patient questions:  Do you have a fever, pain , or abdominal swelling? no Pain Score  0 *  Have you tolerated food without any problems? yes  Have you been able to return to your normal activities? yes  Do you have any questions about your discharge instructions: Diet   no Medications  no Follow up visit  no  Do you have questions or concerns about your Care? no  Actions: * If pain score is 4 or above: No action needed, pain <4.

## 2013-03-10 ENCOUNTER — Encounter: Payer: Self-pay | Admitting: Gastroenterology

## 2013-04-03 ENCOUNTER — Ambulatory Visit: Payer: Medicare HMO

## 2013-04-07 LAB — HEMOCCULT SLIDES (X 3 CARDS)
OCCULT 1: NEGATIVE
OCCULT 2: NEGATIVE
OCCULT 3: NEGATIVE
OCCULT 4: NEGATIVE
OCCULT 5: NEGATIVE

## 2013-04-08 NOTE — Progress Notes (Signed)
Quick Note:  Please inform the patient that hemeoccults were normal and to continue current plan of action ______ 

## 2013-05-15 ENCOUNTER — Ambulatory Visit (INDEPENDENT_AMBULATORY_CARE_PROVIDER_SITE_OTHER): Payer: Commercial Managed Care - HMO | Admitting: Cardiovascular Disease

## 2013-05-15 ENCOUNTER — Encounter: Payer: Self-pay | Admitting: Cardiovascular Disease

## 2013-05-15 VITALS — BP 130/80 | HR 74 | Ht 65.0 in | Wt 169.0 lb

## 2013-05-15 DIAGNOSIS — R609 Edema, unspecified: Secondary | ICD-10-CM

## 2013-05-15 DIAGNOSIS — Z79899 Other long term (current) drug therapy: Secondary | ICD-10-CM

## 2013-05-15 DIAGNOSIS — I10 Essential (primary) hypertension: Secondary | ICD-10-CM | POA: Insufficient documentation

## 2013-05-15 DIAGNOSIS — R0609 Other forms of dyspnea: Secondary | ICD-10-CM

## 2013-05-15 DIAGNOSIS — E785 Hyperlipidemia, unspecified: Secondary | ICD-10-CM | POA: Insufficient documentation

## 2013-05-15 DIAGNOSIS — R0989 Other specified symptoms and signs involving the circulatory and respiratory systems: Secondary | ICD-10-CM

## 2013-05-15 DIAGNOSIS — R600 Localized edema: Secondary | ICD-10-CM | POA: Insufficient documentation

## 2013-05-15 DIAGNOSIS — R0600 Dyspnea, unspecified: Secondary | ICD-10-CM | POA: Insufficient documentation

## 2013-05-15 MED ORDER — METOPROLOL TARTRATE 25 MG PO TABS: 12.5000 mg | ORAL_TABLET | Freq: Two times a day (BID) | ORAL | Status: DC

## 2013-05-15 MED ORDER — SIMVASTATIN 40 MG PO TABS: 40.0000 mg | ORAL_TABLET | Freq: Every day | ORAL | Status: DC

## 2013-05-15 MED ORDER — POTASSIUM CHLORIDE CRYS ER 20 MEQ PO TBCR: 20.0000 meq | EXTENDED_RELEASE_TABLET | Freq: Every day | ORAL | Status: DC

## 2013-05-15 MED ORDER — FUROSEMIDE 20 MG PO TABS: 20.0000 mg | ORAL_TABLET | Freq: Every day | ORAL | Status: DC

## 2013-05-15 MED ORDER — VALSARTAN 160 MG PO TABS: 160.0000 mg | ORAL_TABLET | Freq: Every day | ORAL | Status: DC

## 2013-05-15 NOTE — Progress Notes (Signed)
05/15/2013 Baker Janus   02/17/1941  [id number]  Primary Physician Alva Garnet., MD Primary Cardiologist: Runell Gess MD Roseanne Reno   HPI:  The patient is a delightful, 72 year old, mildly overweight, single [place] American female with no children whom I last saw 7 months ago. Her problems include hypertension, hyperlipidemia, continued tobacco abuse of one-third pack per day, and family history of premature heart disease. She had a negative stress test on December 24, 2007. She was admitted to the [facility] on January 15, 2011, with jaw pain, thought to be an anginal equivalent. I catheterized her, revealing noncritical CAD with a 60% mid LAD lesion. She had anterior takeoff RCA. She has been asymptomatic since. She had carotid Dopplers performed at Dr. Mathews Robinsons office which apparently were unremarkable. A lipid profile performed in March revealed total cholesterol of 142, LDL of 76, and HDL of 47. Her major complaint is of lower extremity edema, which gets worse during the day. I had done venous reflux studies on her back in November which were normal.since I saw her last a year and a half ago she has noticed increasing lower extremity edema which is dependent as well as increasing dyspnea on exertion. She denies chest pain. Her weight is stable same. She does admit to dietary indiscretion with regard to salt.     Current Outpatient Prescriptions  Medication Sig Dispense Refill  . Arginine 1000 MG TABS Take 1 tablet by mouth daily.      [name] aspirin 81 MG tablet Take 162 mg by mouth daily.      . calcium carbonate (TUMS - DOSED IN MG ELEMENTAL CALCIUM) 500 MG chewable tablet Chew 1 tablet by mouth as needed for heartburn.      . cetirizine (ZYRTEC) 10 MG tablet Take 10 mg by mouth daily as needed for allergies.      . Cholecalciferol (VITAMIN D) 2000 UNITS CAPS Take 1 capsule by mouth daily.      . furosemide (LASIX) 20 MG tablet Take 10 mg by mouth daily.        . metoprolol tartrate (LOPRESSOR) 25 MG tablet Take 25 mg by mouth daily.      . potassium chloride SA (K-DUR,KLOR-CON) 20 MEQ tablet Take 20 mEq by mouth daily.      . Probiotic Product (PROBIOTIC DAILY PO) Take 1 tablet by mouth daily.      . simvastatin (ZOCOR) 40 MG tablet Take 40 mg by mouth daily.      . valsartan (DIOVAN) 160 MG tablet Take 160 mg by mouth daily.       No current facility-administered medications for this visit.    Allergies  Allergen Reactions  . Codeine     REACTION: chest pain  . Nexium [Esomeprazole Magnesium]     Pt states she was unable to swallow for several hours after taking the nexium    History   Social History  . Marital Status: Single    Spouse Name: N/A    Number of Children: 0  . Years of Education: N/A   Occupational History  . retired    Social History Main Topics  . Smoking status: Current Every Day Smoker -- 0.30 packs/day for 15 years    Types: Cigarettes  . Smokeless tobacco: Never Used  . Alcohol Use: 7.0 oz/week    14 drink(s) per week     Comment: every night  . Drug Use: No  . Sexual Activity: Not on file  Other Topics Concern  . Not on file   Social History Narrative  . No narrative on file     Review of Systems: General: negative for chills, fever, night sweats or weight changes.  Cardiovascular: negative for chest pain, dyspnea on exertion, edema, orthopnea, palpitations, paroxysmal nocturnal dyspnea or shortness of breath Dermatological: negative for rash Respiratory: negative for cough or wheezing Urologic: negative for hematuria Abdominal: negative for nausea, vomiting, diarrhea, bright red blood per rectum, melena, or hematemesis Neurologic: negative for visual changes, syncope, or dizziness All other systems reviewed and are otherwise negative except as noted above.    Blood pressure 130/80, pulse 74, height 5\' 5"  (1.651 m), weight 169 lb (76.658 kg).  General appearance: alert and no distress Neck:  no adenopathy, no carotid bruit, no JVD, supple, symmetrical, trachea midline and thyroid not enlarged, symmetric, no tenderness/mass/nodules Lungs: clear to auscultation bilaterally Heart: regular rate and rhythm, S1, S2 normal, no murmur, click, rub or gallop Extremities: extremities normal, atraumatic, no cyanosis or edema  EKG normal sinus rhythm at 74 without ST or T wave changes  ASSESSMENT AND PLAN:   Lower extremity edema The patient has had a long history of lower extremity edema. She is on Lasix every other day. LV function the past by echo and lethargic likely have been normal as recently as 4 years ago but she says her edema has gotten worse. She does admit to dietary indiscretion with regard to salt. I'm going to increase her Lasix from every other day to daily I will check a Bmet . I am also going to repeat a  2-D echocardiogram for LV function.  Essential hypertension Controlled on current medications  Hyperlipidemia On statin therapy with recent lipid profile performed by her PCP revealing a total dose of 151, LDL 75 HDL of 56      Runell Gess MD [facility], [facility] 05/15/2013 2:14 PM

## 2013-05-15 NOTE — Patient Instructions (Signed)
  Your physician wants you to follow-up with him in : 1 year with Dr Allyson Sabal                                            and with an extender in : 6 months                    You will receive a reminder letter in the mail one month in advance. If you don't receive a letter, please call our office to schedule the follow-up appointment.   Your physician recommends that you return for lab work in: 3 weeks   Your physician has recommended you make the following change in your medication: Increase lasix to a whole tablet daily   Your physician has ordered the following tests: an echocardiogram

## 2013-05-15 NOTE — Assessment & Plan Note (Signed)
On statin therapy with recent lipid profile performed by her PCP revealing a total dose of 151, LDL 75 HDL of 56 

## 2013-05-15 NOTE — Assessment & Plan Note (Signed)
Controlled on current medications 

## 2013-05-15 NOTE — Assessment & Plan Note (Signed)
The patient has had a long history of lower extremity edema. She is on Lasix every other day. LV function the past by echo and lethargic likely have been normal as recently as 4 years ago but she says her edema has gotten worse. She does admit to dietary indiscretion with regard to salt. I'm going to increase her Lasix from every other day to daily I will check a Bmet . I am also going to repeat a  2-D echocardiogram for LV function.

## 2013-05-16 ENCOUNTER — Encounter: Payer: Self-pay | Admitting: Cardiovascular Disease

## 2013-05-29 ENCOUNTER — Ambulatory Visit (HOSPITAL_COMMUNITY)
Admission: RE | Admit: 2013-05-29 | Discharge: 2013-05-29 | Disposition: A | Discharge status: Home / Self Care | Payer: Medicare HMO | Source: Ambulatory Visit | Attending: Cardiovascular Disease | Admitting: Cardiovascular Disease

## 2013-05-29 DIAGNOSIS — I10 Essential (primary) hypertension: Secondary | ICD-10-CM

## 2013-05-29 DIAGNOSIS — R0609 Other forms of dyspnea: Secondary | ICD-10-CM | POA: Insufficient documentation

## 2013-05-29 DIAGNOSIS — R0989 Other specified symptoms and signs involving the circulatory and respiratory systems: Secondary | ICD-10-CM

## 2013-05-29 DIAGNOSIS — R609 Edema, unspecified: Secondary | ICD-10-CM | POA: Insufficient documentation

## 2013-05-29 DIAGNOSIS — R0600 Dyspnea, unspecified: Secondary | ICD-10-CM

## 2013-05-29 DIAGNOSIS — E785 Hyperlipidemia, unspecified: Secondary | ICD-10-CM | POA: Insufficient documentation

## 2013-05-29 NOTE — Progress Notes (Signed)
2D Echo Performed 05/29/2013    Aulden Calise, RCS  

## 2013-05-30 ENCOUNTER — Encounter: Payer: Self-pay | Admitting: *Deleted

## 2013-08-07 ENCOUNTER — Encounter: Payer: Self-pay | Admitting: *Deleted

## 2013-08-11 ENCOUNTER — Ambulatory Visit (INDEPENDENT_AMBULATORY_CARE_PROVIDER_SITE_OTHER): Payer: Medicare HMO | Admitting: Physician Assistant

## 2013-08-11 ENCOUNTER — Encounter: Payer: Self-pay | Admitting: Physician Assistant

## 2013-08-11 VITALS — BP 130/70 | HR 80 | Ht 65.0 in | Wt 173.0 lb

## 2013-08-11 DIAGNOSIS — R200 Anesthesia of skin: Secondary | ICD-10-CM | POA: Insufficient documentation

## 2013-08-11 DIAGNOSIS — R209 Unspecified disturbances of skin sensation: Secondary | ICD-10-CM

## 2013-08-11 NOTE — Patient Instructions (Signed)
1.  Well will schedule lower extremity arterial dopplers(left). 2.  Follow up with Dr. Gwenlyn Found in June.

## 2013-08-11 NOTE — Progress Notes (Signed)
Date:  08/11/2013   ID:  Megan Hull, DOB 1940-09-10, MRN [id number]  PCP:  Salena Saner., MD  Primary Cardiologist:  Gwenlyn Found    History of Present Illness: Megan Hull is a 73 y.o. female mildly overweight, single [name] with no children whom I last saw 7 months ago. Her problems include hypertension, hyperlipidemia, continued tobacco abuse of one-third pack per day, and family history of premature heart disease. She had a negative stress test on December 24, 2007. She was admitted to the [facility] on January 15, 2011, with jaw pain, thought to be an anginal equivalent. I catheterized her, revealing noncritical CAD with a 60% mid LAD lesion. She had anterior takeoff RCA. She has been asymptomatic since. She had carotid Dopplers performed at Dr. Roland Earl office which apparently were unremarkable. A lipid profile performed in March revealed total cholesterol of 142, LDL of 76, and HDL of 47. Her major complaint is of lower extremity edema, which gets worse during the day. I had done venous reflux studies on her back in November which were normal.since I saw her last a year and a half ago she has noticed increasing lower extremity edema which is dependent as well as increasing dyspnea on exertion. She denies chest pain. Her weight is stable same. She does admit to dietary indiscretion with regard to salt.  The patient presents after having two episodes of left lower extremity numbness spread three months apart.  The most recent last week.  She reports waking up and from the knee down her leg appears "dead" with complete numbness and inability to move it. She reports getting up and is dangling her feet and rubbing her leg off the side of the bed and the feeling slowly comes back.  She reports no other neurological symptoms. No weakness in the upper extremities, slurring of speech or facial droop.   The patient currently denies nausea, vomiting, fever, chest pain,  shortness of breath, orthopnea, dizziness, PND, cough, congestion, abdominal pain, hematochezia, melena, lower extremity edema, claudication.  Wt Readings from Last 3 Encounters:  08/11/13 173 lb (78.472 kg)  05/15/13 169 lb (76.658 kg)  02/27/13 171 lb (77.565 kg)     Past Medical History  Diagnosis Date  . Hypertension   . HLD (hyperlipidemia)   . Diverticulosis   . GERD (gastroesophageal reflux disease)   . Supraventricular arrhythmia   . Diarrhea   . Lower extremity edema   . Tobacco abuse   . Family history of premature CAD     Current Outpatient Prescriptions  Medication Sig Dispense Refill  . Arginine 1000 MG TABS Take 1 tablet by mouth daily.      [name] aspirin 81 MG tablet Take 162 mg by mouth daily.      . calcium carbonate (TUMS - DOSED IN MG ELEMENTAL CALCIUM) 500 MG chewable tablet Chew 1 tablet by mouth as needed for heartburn.      . cetirizine (ZYRTEC) 10 MG tablet Take 10 mg by mouth daily as needed for allergies.      . Cholecalciferol (VITAMIN D) 2000 UNITS CAPS Take 1 capsule by mouth daily.      . furosemide (LASIX) 20 MG tablet Take 1 tablet (20 mg total) by mouth daily.  90 tablet  3  . metoprolol tartrate (LOPRESSOR) 25 MG tablet Take 0.5 tablets (12.5 mg total) by mouth 2 (two) times daily.  90 tablet  3  . potassium chloride SA (K-DUR,KLOR-CON) 20 MEQ tablet Take 1  tablet (20 mEq total) by mouth daily.  90 tablet  3  . Probiotic Product (PROBIOTIC DAILY PO) Take 1 tablet by mouth daily.      . simvastatin (ZOCOR) 40 MG tablet Take 1 tablet (40 mg total) by mouth daily.  90 tablet  3  . valsartan (DIOVAN) 160 MG tablet Take 1 tablet (160 mg total) by mouth daily.  90 tablet  3   No current facility-administered medications for this visit.    Allergies:    Allergies  Allergen Reactions  . Codeine     REACTION: chest pain  . Nexium [Esomeprazole Magnesium]     Pt states she was unable to swallow for several hours after taking the nexium  . Other      Red Fish  . Shellfish Allergy     Social History:  The patient  reports that she has been smoking Cigarettes.  She has a 4.5 pack-year smoking history. She has never used smokeless tobacco. She reports that she drinks about 7.0 ounces of alcohol per week. She reports that she does not use illicit drugs.   Family history:   Family History  Problem Relation Age of Onset  . Heart disease Brother     MI @ 25, HTN @ 30, DM @ 39   . Heart disease Father     MI  . Heart disease Mother     MI, CVA @ 64  . Heart disease Sister     CHF, CVA  . Esophageal cancer Brother   . Alzheimer's disease Sister   . Colon cancer Neg Hx   . Heart disease Brother     MI in 26s, lung cancer  . Heart disease Sister   . Hypertension Sister     x2  . Hyperlipidemia Sister     x2  . Diabetes Sister     x2  . Cancer Sister   . Hypertension Brother     ROS:  Please see the history of present illness.  All other systems reviewed and negative.   PHYSICAL EXAM: VS:  BP 130/70  Pulse 80  Ht 5\' 5"  (1.651 m)  Wt 173 lb (78.472 kg)  BMI 28.79 kg/m2 Well nourished, well developed, in no acute distress HEENT: Pupils are equal round react to light accommodation extraocular movements are intact.  Neck: no JVDNo cervical lymphadenopathy. Cardiac: Regular rate and rhythm without murmurs rubs or gallops. Lungs:  clear to auscultation bilaterally, no wheezing, rhonchi or rales Ext: no lower extremity edema.  2+ radial and dorsalis pedis pulses. Skin: warm and dry Neuro:  Grossly normal, cranial nerves II through XII intact.[name] strength is 5 out of 5 equal in the upper and lower extremities. She has good sensitivity in the left lower extremity   ASSESSMENT AND PLAN:  Problem List Items Addressed This Visit   Lower extremity numbness     The patient has had two episodes of left lower extremity numbness spread three months apart.  The most recent last week.  She reports waking up and from the knee down her leg  appears "dead" with complete numbness and inability to move it. She reports getting up and is dangling her feet and rubbing her leg off the side of the bed and the feeling slowly comes back.   Will go ahead and obtain a lower extremity arterial Dopplers although the patient's pulses are good.  This may be more of an intermittent neurological issue.  The Dopplers are negative will refer  to neurology.     Other Visit Diagnoses   Numbness    -  Primary    Relevant Orders       Lower Extremity Arterial Doppler Left

## 2013-08-11 NOTE — Assessment & Plan Note (Signed)
The patient has had two episodes of left lower extremity numbness spread three months apart.  The most recent last week.  She reports waking up and from the knee down her leg appears "dead" with complete numbness and inability to move it. She reports getting up and is dangling her feet and rubbing her leg off the side of the bed and the feeling slowly comes back.   Will go ahead and obtain a lower extremity arterial Dopplers although the patient's pulses are good.  This may be more of an intermittent neurological issue.  The Dopplers are negative will refer to neurology.

## 2013-08-20 ENCOUNTER — Encounter (HOSPITAL_COMMUNITY): Payer: Medicare HMO

## 2013-08-21 ENCOUNTER — Ambulatory Visit (HOSPITAL_COMMUNITY)
Admission: RE | Admit: 2013-08-21 | Discharge: 2013-08-21 | Disposition: A | Discharge status: Home / Self Care | Payer: Medicare HMO | Source: Ambulatory Visit | Attending: Cardiovascular Disease | Admitting: Cardiovascular Disease

## 2013-08-21 DIAGNOSIS — R0989 Other specified symptoms and signs involving the circulatory and respiratory systems: Secondary | ICD-10-CM

## 2013-08-21 DIAGNOSIS — R209 Unspecified disturbances of skin sensation: Secondary | ICD-10-CM | POA: Insufficient documentation

## 2013-08-21 DIAGNOSIS — R200 Anesthesia of skin: Secondary | ICD-10-CM

## 2013-08-21 NOTE — Progress Notes (Signed)
Left lower ext. Arterial duplex completed. Megan Hull, BS, RDMS, RVT

## 2013-09-01 ENCOUNTER — Encounter: Payer: Self-pay | Admitting: *Deleted

## 2013-10-31 ENCOUNTER — Telehealth: Payer: Self-pay | Admitting: Cardiovascular Disease

## 2013-10-31 NOTE — Telephone Encounter (Signed)
Closed encounter °

## 2013-12-05 ENCOUNTER — Ambulatory Visit: Payer: Medicare HMO | Admitting: Cardiovascular Disease

## 2013-12-13 ENCOUNTER — Ambulatory Visit (INDEPENDENT_AMBULATORY_CARE_PROVIDER_SITE_OTHER): Payer: 59 | Admitting: Family Medicine

## 2013-12-13 VITALS — BP 120/72 | HR 74 | Temp 98.3°F | Resp 16 | Ht 67.0 in | Wt 168.0 lb

## 2013-12-13 DIAGNOSIS — R059 Cough, unspecified: Secondary | ICD-10-CM

## 2013-12-13 DIAGNOSIS — J40 Bronchitis, not specified as acute or chronic: Secondary | ICD-10-CM

## 2013-12-13 DIAGNOSIS — R05 Cough: Secondary | ICD-10-CM

## 2013-12-13 MED ORDER — CEFDINIR 300 MG PO CAPS: 300.0000 mg | ORAL_CAPSULE | Freq: Two times a day (BID) | ORAL | Status: DC

## 2013-12-13 NOTE — Progress Notes (Signed)
[facility] [address] [place] [address] [phone]  Date:  12/13/2013   Name:  Megan Hull   DOB:  05-01-1941   MRN:  [id number]  PCP:  Salena Saner., MD    Chief Complaint: Cough   History of Present Illness:  Megan Hull is a 73 y.o. very pleasant female patient who presents with the following:  Here today as a new patient with illness.  Regular pt of Dr. Karlton Lemon.   She has noted a cough for about 6 days, which is now producitve of "colorful mucus."  She can here her mucus rattle in her chest.   She has not noted a fever.  She has noted some aches and fatigue since yesterday. No ST, no runny nose.   She recently traveled to [place] and did have some sinus congestion while she was there.   No GI symptoms.   She has a history of HTN, edema, CAD  She is taking some tessalon perles which are helping some.    Patient Active Problem List   Diagnosis Date Noted  . Lower extremity numbness 08/11/2013  . Essential hypertension 05/15/2013  . Hyperlipidemia 05/15/2013  . Dyspnea on exertion 05/15/2013  . Lower extremity edema 05/15/2013  . Diarrhea 02/12/2013  . Dyspepsia and other specified disorders of function of stomach 09/23/2012  . Esophageal reflux 01/20/2010  . DYSPHAGIA UNSPECIFIED 01/20/2010    Past Medical History  Diagnosis Date  . Hypertension   . HLD (hyperlipidemia)   . Diverticulosis   . GERD (gastroesophageal reflux disease)   . Supraventricular arrhythmia   . Diarrhea   . Lower extremity edema   . Tobacco abuse   . Family history of premature CAD     Past Surgical History  Procedure Laterality Date  . Infected lymph node surgery    . Tonsillectomy    . Colonoscopy    . 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 Adora Fridge)  . Transthoracic echocardiogram  12/2007    mild conc LVH  . Nm myocar perf wall motion  04/2011    bruce myoview; normal pattern of perfusion,  EF 80%, low risk scan    History  Substance Use Topics  . Smoking status: Current Every Day Smoker -- 0.30 packs/day for 15 years    Types: Cigarettes  . Smokeless tobacco: Never Used  . Alcohol Use: 7.0 oz/week    14 drink(s) per week     Comment: every night    Family History  Problem Relation Age of Onset  . Heart disease Brother     MI @ 6, HTN @ 70, DM @ 62   . Heart disease Father     MI  . Heart disease Mother     MI, CVA @ 86  . Heart disease Sister     CHF, CVA  . Esophageal cancer Brother   . Alzheimer's disease Sister   . Colon cancer Neg Hx   . Heart disease Brother     MI in 90s, lung cancer  . Heart disease Sister   . Hypertension Sister     x2  . Hyperlipidemia Sister     x2  . Diabetes Sister     x2  . Cancer Sister   . Hypertension Brother     Allergies  Allergen Reactions  . Codeine     REACTION: chest pain  . Nexium [Esomeprazole Magnesium]  Pt states she was unable to swallow for several hours after taking the nexium  . Other     Red Fish  . Shellfish Allergy     Medication list has been reviewed and updated.  Current Outpatient Prescriptions on File Prior to Visit  Medication Sig Dispense Refill  . aspirin 81 MG tablet Take 162 mg by mouth daily.      . cetirizine (ZYRTEC) 10 MG tablet Take 10 mg by mouth daily as needed for allergies.      . Cholecalciferol (VITAMIN D) 2000 UNITS CAPS Take 1 capsule by mouth daily.      . furosemide (LASIX) 20 MG tablet Take 1 tablet (20 mg total) by mouth daily.  90 tablet  3  . metoprolol tartrate (LOPRESSOR) 25 MG tablet Take 0.5 tablets (12.5 mg total) by mouth 2 (two) times daily.  90 tablet  3  . potassium chloride SA (K-DUR,KLOR-CON) 20 MEQ tablet Take 1 tablet (20 mEq total) by mouth daily.  90 tablet  3  . simvastatin (ZOCOR) 40 MG tablet Take 1 tablet (40 mg total) by mouth daily.  90 tablet  3   No current facility-administered medications on file prior to visit.    Review of  Systems:  As per HPI- otherwise negative.   Physical Examination: Filed Vitals:   12/13/13 1317  BP: 120/72  Pulse: 74  Temp: 98.3 F (36.8 C)  Resp: 16   Filed Vitals:   12/13/13 1317  Height: 5\' 7"  (1.702 m)  Weight: 168 lb (76.204 kg)   Body mass index is 26.31 kg/(m^2). Ideal Body Weight: Weight in (lb) to have BMI = 25: 159.3  GEN: WDWN, NAD, Non-toxic, A & O x 3, looks well HEENT: Atraumatic, Normocephalic. Neck supple. No masses, No LAD.  Bilateral TM wnl, oropharynx normal.  PEERL,EOMI.   Ears and Nose: No external deformity. CV: RRR, No M/G/R. No JVD. No thrill. No extra heart sounds. PULM: CTA B, no wheezes, crackles,mild rhonchi. No retractions. No resp. distress. No accessory muscle use.[name] EXTR: No c/c/e NEURO Normal gait.  PSYCH: Normally interactive. Conversant. Not depressed or anxious appearing.  Calm demeanor.    Assessment and Plan: Cough  Bronchitis - Plan: cefdinir (OMNICEF) 300 MG capsule, DISCONTINUED: cefdinir (OMNICEF) 300 MG capsule  Treat for cough for one week, bronchitis with omnicef.  She has a RF on her tessalon perles that she can get at her pharmacy See patient instructions for more details.     Signed Lamar Blinks, MD

## 2013-12-13 NOTE — Patient Instructions (Signed)
Use the omnicef antibiotic as directed.  Continue the tessalon perles as needed for cough.  Let me know if you are not feeling better in the next few days- Sooner if worse.

## 2013-12-19 ENCOUNTER — Encounter: Payer: Self-pay | Admitting: Cardiovascular Disease

## 2013-12-19 ENCOUNTER — Ambulatory Visit (INDEPENDENT_AMBULATORY_CARE_PROVIDER_SITE_OTHER): Payer: Medicare HMO | Admitting: Cardiovascular Disease

## 2013-12-19 VITALS — BP 120/82 | HR 69 | Ht 65.5 in | Wt 167.0 lb

## 2013-12-19 DIAGNOSIS — R609 Edema, unspecified: Secondary | ICD-10-CM

## 2013-12-19 DIAGNOSIS — E785 Hyperlipidemia, unspecified: Secondary | ICD-10-CM

## 2013-12-19 DIAGNOSIS — R600 Localized edema: Secondary | ICD-10-CM

## 2013-12-19 DIAGNOSIS — I10 Essential (primary) hypertension: Secondary | ICD-10-CM

## 2013-12-19 NOTE — Assessment & Plan Note (Signed)
Controlled on current medications 

## 2013-12-19 NOTE — Assessment & Plan Note (Signed)
On low-dose diuretics. She is aware of salt restriction

## 2013-12-19 NOTE — Assessment & Plan Note (Addendum)
On statin therapy followed by her PCP 

## 2013-12-19 NOTE — Progress Notes (Signed)
12/19/2013 Megan Hull   1940/08/07  [id number]  Primary Physician Gavin Pound, MD Primary Cardiologist: Lorretta Harp MD [name]   HPI:  The patient is a delightful, 73 year old, mildly overweight, single [name] with no children whom I last saw 7 months ago. Her problems include hypertension, hyperlipidemia, continued tobacco abuse of one-third pack per day, and family history of premature heart disease. She had a negative stress test on December 24, 2007. She was admitted to the [facility] on January 15, 2011, with jaw pain, thought to be an anginal equivalent. I catheterized her, revealing noncritical CAD with a 60% mid LAD lesion. She had anterior takeoff RCA. She has been asymptomatic since. She had carotid Dopplers performed at Dr. Roland Earl office which apparently were unremarkable. A lipid profile performed in March revealed total cholesterol of 142, LDL of 76, and HDL of 47. Her major complaint is of lower extremity edema, which gets worse during the day. I had done venous reflux studies on her back in November which were normal.since I saw her last a year and a half ago she has noticed increasing lower extremity edema which is dependent as well as increasing dyspnea on exertion. She denies chest pain. Her weight is stable same. She does admit to dietary indiscretion with regard to salt. She empirically increased her diuretic therapy. Time resulting in improvement in her edema.    Current Outpatient Prescriptions  Medication Sig Dispense Refill  . aspirin 81 MG tablet Take 162 mg by mouth daily.      . cetirizine (ZYRTEC) 10 MG tablet Take 10 mg by mouth daily as needed for allergies.      . Cholecalciferol (VITAMIN D) 2000 UNITS CAPS Take 1 capsule by mouth daily.      . furosemide (LASIX) 20 MG tablet Take 1 tablet (20 mg total) by mouth daily.  90 tablet  3  . metoprolol tartrate (LOPRESSOR) 25 MG tablet Take 0.5 tablets (12.5 mg total) by  mouth 2 (two) times daily.  90 tablet  3  . potassium chloride SA (K-DUR,KLOR-CON) 20 MEQ tablet Take 1 tablet (20 mEq total) by mouth daily.  90 tablet  3  . simvastatin (ZOCOR) 40 MG tablet Take 1 tablet (40 mg total) by mouth daily.  90 tablet  3  . valsartan (DIOVAN) 80 MG tablet Take 80 mg by mouth daily. Brand name only       No current facility-administered medications for this visit.    Allergies  Allergen Reactions  . Codeine     REACTION: chest pain  . Nexium [Esomeprazole Magnesium]     Pt states she was unable to swallow for several hours after taking the nexium  . Other     Red Fish  . Shellfish Allergy   . Valsartan     Generic was not therapeutic    History   Social History  . Marital Status: Single    Spouse Name: N/A    Number of Children: 0  . Years of Education: N/A   Occupational History  . retired    Social History Main Topics  . Smoking status: Current Every Day Smoker -- 0.30 packs/day for 15 years    Types: Cigarettes  . Smokeless tobacco: Never Used  . Alcohol Use: 7.0 oz/week    14 drink(s) per week     Comment: every night  . Drug Use: No  . Sexual Activity: Not on file   Other Topics Concern  .  Not on file   Social History Narrative  . No narrative on file     Review of Systems: General: negative for chills, fever, night sweats or weight changes.  Cardiovascular: negative for chest pain, dyspnea on exertion, edema, orthopnea, palpitations, paroxysmal nocturnal dyspnea or shortness of breath Dermatological: negative for rash Respiratory: negative for cough or wheezing Urologic: negative for hematuria Abdominal: negative for nausea, vomiting, diarrhea, bright red blood per rectum, melena, or hematemesis Neurologic: negative for visual changes, syncope, or dizziness All other systems reviewed and are otherwise negative except as noted above.    Blood pressure 120/82, pulse 69, height 5' 5.5" (1.664 m), weight 167 lb (75.751 kg).    General appearance: alert and no distress Neck: no adenopathy, no carotid bruit, no JVD, supple, symmetrical, trachea midline and thyroid not enlarged, symmetric, no tenderness/mass/nodules Lungs: clear to auscultation bilaterally Heart: regular rate and rhythm, S1, S2 normal, no murmur, click, rub or gallop Extremities: extremities normal, atraumatic, no cyanosis or edema  EKG normal sinus rhythm at 69 without ST or T wave changes  ASSESSMENT AND PLAN:   Lower extremity edema On low-dose diuretics. She is aware of salt restriction  Hyperlipidemia On statin therapy followed by her PCP  Essential hypertension Controlled on current medications      Lorretta Harp MD [facility], [facility] 12/19/2013 5:32 PM

## 2013-12-19 NOTE — Patient Instructions (Signed)
Your physician wants you to follow-up in: 1 year with Dr Berry. You will receive a reminder letter in the mail two months in advance. If you don't receive a letter, please call our office to schedule the follow-up appointment.  

## 2014-01-27 ENCOUNTER — Ambulatory Visit: Payer: Medicare HMO | Admitting: Nurse Practitioner

## 2014-02-04 ENCOUNTER — Other Ambulatory Visit (HOSPITAL_COMMUNITY): Payer: Self-pay | Admitting: Family Medicine

## 2014-02-04 DIAGNOSIS — Z1231 Encounter for screening mammogram for malignant neoplasm of breast: Secondary | ICD-10-CM

## 2014-02-20 ENCOUNTER — Telehealth: Payer: Self-pay | Admitting: Cardiovascular Disease

## 2014-02-20 NOTE — Telephone Encounter (Signed)
Please call,says she wants to talk to you about the authorization they faxed over for her Diovan please.

## 2014-02-20 NOTE — Telephone Encounter (Signed)
Patient was just calling to let us know a fax will be coming in with her name on it from her pharmacy. She isn't sure exactly what kind of form but she wants them to know that she can only take Diovan not Valsartan

## 2014-02-23 ENCOUNTER — Ambulatory Visit (HOSPITAL_COMMUNITY)
Admission: RE | Admit: 2014-02-23 | Discharge: 2014-02-23 | Disposition: A | Discharge status: Home / Self Care | Payer: Medicare HMO | Source: Ambulatory Visit | Attending: Family Medicine | Admitting: Family Medicine

## 2014-02-23 ENCOUNTER — Ambulatory Visit: Payer: Medicare HMO | Admitting: Nurse Practitioner

## 2014-02-23 DIAGNOSIS — Z1231 Encounter for screening mammogram for malignant neoplasm of breast: Secondary | ICD-10-CM

## 2014-02-24 ENCOUNTER — Telehealth: Payer: Self-pay | Admitting: *Deleted

## 2014-02-24 NOTE — Telephone Encounter (Signed)
PA for brand name Diovan sent in to [facility].  Patient didn't reach goal with Altace, losartan, losartan hct or valsartan.  She has been on diovan since before 2009 and has been trying other meds throughout that time period off and on.

## 2014-02-26 NOTE — Telephone Encounter (Signed)
PA was approved!! Form faxed back to [facility] to let them know.

## 2014-04-23 ENCOUNTER — Ambulatory Visit (INDEPENDENT_AMBULATORY_CARE_PROVIDER_SITE_OTHER): Payer: Medicare HMO | Admitting: Gastroenterology

## 2014-04-23 ENCOUNTER — Encounter: Payer: Self-pay | Admitting: Gastroenterology

## 2014-04-23 VITALS — BP 142/82 | HR 76 | Ht 65.0 in | Wt 167.0 lb

## 2014-04-23 DIAGNOSIS — K222 Esophageal obstruction: Secondary | ICD-10-CM

## 2014-04-23 MED ORDER — PANTOPRAZOLE SODIUM 40 MG PO TBEC: 40.0000 mg | DELAYED_RELEASE_TABLET | Freq: Every day | ORAL | Status: DC

## 2014-04-23 NOTE — Assessment & Plan Note (Signed)
Worsening dysphagia and severe odynophagia is related to the stricture.  She probably has a component of spasm as well.    Recommendations #1 upper endoscopy with Savary dilation

## 2014-04-23 NOTE — Progress Notes (Signed)
      History of Present Illness:  Ms. Drummondis here again for dysphagia.  She's complaining of frequent dysphagia to solids.  Dysphagia forces her to eat very small bites.  He has to vomit up food that sticks in her esophagus.  When this occurs she has severe chest pain.  She denies pyrosis.she has a history of a distal esophageal stricture for which she is undergone dilation the past.  Barium swallow, which I reviewed from one year ago, demonstrated a recurrent esophageal stricture.  At that time symptoms were minimal.    Review of Systems: she has a loose stool initially in the mornings.Pertinent positive and negative review of systems were noted in the above HPI section. All other review of systems were otherwise negative.    Current Medications, Allergies, Past Medical History, Past Surgical History, Family History and Social History were reviewed in [place] record  Vital signs were reviewed in today's medical record. Physical Exam: General: Well developed , well nourished, no acute distress Skin: anicteric Head: Normocephalic and atraumatic Eyes:  sclerae anicteric, EOMI Ears: Normal auditory acuity Mouth: No deformity or lesions Lungs: Clear throughout to auscultation Heart: Regular rate and rhythm; no murmurs, rubs or bruits Abdomen: Soft, non tender and non distended. No masses, hepatosplenomegaly or hernias noted. Normal Bowel sounds Rectal:deferred Musculoskeletal: Symmetrical with no gross deformities  Pulses:  Normal pulses noted Extremities: No clubbing, cyanosis, edema or deformities noted Neurological: Alert oriented x 4, grossly nonfocal Psychological:  Alert and cooperative. Normal mood and affect  See Assessment and Plan under Problem List

## 2014-04-23 NOTE — Patient Instructions (Signed)
Your procedure has been scheduled at [facility] on 06/02/2014 Separate instructions have been given  We have sent Protonix to your pharmacy

## 2014-05-21 ENCOUNTER — Encounter (HOSPITAL_COMMUNITY): Payer: Self-pay | Admitting: *Deleted

## 2014-05-28 ENCOUNTER — Telehealth: Payer: Self-pay | Admitting: Gastroenterology

## 2014-06-01 NOTE — Telephone Encounter (Signed)
I have left message for the patient to call back 

## 2014-06-01 NOTE — Telephone Encounter (Signed)
Patient is still sick. Procedure cancelled

## 2014-06-02 ENCOUNTER — Ambulatory Visit (HOSPITAL_COMMUNITY): Admission: RE | Admit: 2014-06-02 | Payer: Medicare HMO | Source: Ambulatory Visit | Admitting: Gastroenterology

## 2014-06-02 HISTORY — DX: Cardiac arrhythmia, unspecified: I49.9

## 2014-06-02 SURGERY — ESOPHAGOGASTRODUODENOSCOPY (EGD) WITH PROPOFOL
Anesthesia: Monitor Anesthesia Care

## 2014-06-08 ENCOUNTER — Other Ambulatory Visit: Payer: Self-pay | Admitting: Cardiovascular Disease

## 2014-06-08 NOTE — Telephone Encounter (Signed)
E sent to pharmacy 

## 2014-07-10 ENCOUNTER — Telehealth: Payer: Self-pay | Admitting: Cardiovascular Disease

## 2014-07-10 MED ORDER — VALSARTAN 80 MG PO TABS: 80.0000 mg | ORAL_TABLET | Freq: Every evening | ORAL | Status: DC

## 2014-07-10 NOTE — Telephone Encounter (Signed)
Refill submitted to patient's preferred pharmacy. Informed patient. Pt voiced understanding, no other stated concerns at this time.  

## 2014-07-10 NOTE — Telephone Encounter (Signed)
Mrs.Pawlicki is calling because she is needing you to fax an order for medication(valsartan 160mg )  to her Pharmacy( [facility]) . Please call if you have any questions     Thanks

## 2014-07-16 ENCOUNTER — Telehealth: Payer: Self-pay | Admitting: Cardiovascular Disease

## 2014-07-16 MED ORDER — VALSARTAN 160 MG PO TABS: ORAL_TABLET | ORAL | Status: DC

## 2014-07-16 NOTE — Telephone Encounter (Signed)
Pt called in stating that Dr. Gwenlyn Found was suppose to send a prescription for Valsartan to [facility], but they were unable to fill the prescription because it was missing her DOB and [facility] # [id number]  Thanks

## 2014-07-16 NOTE — Telephone Encounter (Signed)
Im not sure what information is incorrect. The pt told me that it was her Valsartan that needed to be refilled

## 2014-07-16 NOTE — Telephone Encounter (Signed)
I spoke with patient.  She only takes 80mg  so she cuts the 160mg  in half.  She wants to try the generic valsartan.  [place] sent to [facility]

## 2014-07-16 NOTE — Telephone Encounter (Signed)
You might want to double check the info - pt says 160 mg, last JB note says 80.  Chart also says DAW Diovan?

## 2014-07-20 ENCOUNTER — Other Ambulatory Visit: Payer: Self-pay

## 2014-07-20 MED ORDER — VALSARTAN 160 MG PO TABS: 160.0000 mg | ORAL_TABLET | Freq: Every day | ORAL | Status: DC

## 2014-07-20 NOTE — Telephone Encounter (Signed)
Rx(s) sent to pharmacy electronically.  

## 2014-09-24 DIAGNOSIS — G5620 Lesion of ulnar nerve, unspecified upper limb: Secondary | ICD-10-CM | POA: Diagnosis not present

## 2014-09-24 DIAGNOSIS — R7309 Other abnormal glucose: Secondary | ICD-10-CM | POA: Diagnosis not present

## 2014-09-24 DIAGNOSIS — I10 Essential (primary) hypertension: Secondary | ICD-10-CM | POA: Diagnosis not present

## 2014-12-03 DIAGNOSIS — I471 Supraventricular tachycardia: Secondary | ICD-10-CM | POA: Diagnosis not present

## 2014-12-03 DIAGNOSIS — M5410 Radiculopathy, site unspecified: Secondary | ICD-10-CM | POA: Diagnosis not present

## 2014-12-03 DIAGNOSIS — Z780 Asymptomatic menopausal state: Secondary | ICD-10-CM | POA: Diagnosis not present

## 2014-12-04 ENCOUNTER — Other Ambulatory Visit: Payer: Self-pay | Admitting: Family Medicine

## 2014-12-04 DIAGNOSIS — M5410 Radiculopathy, site unspecified: Secondary | ICD-10-CM

## 2014-12-15 ENCOUNTER — Other Ambulatory Visit: Payer: Self-pay | Admitting: Obstetrics and Gynecology

## 2014-12-15 ENCOUNTER — Other Ambulatory Visit (HOSPITAL_COMMUNITY)
Admission: RE | Admit: 2014-12-15 | Discharge: 2014-12-15 | Disposition: A | Discharge status: Home / Self Care | Payer: Commercial Managed Care - HMO | Source: Ambulatory Visit | Attending: Obstetrics and Gynecology | Admitting: Obstetrics and Gynecology

## 2014-12-15 DIAGNOSIS — Z01411 Encounter for gynecological examination (general) (routine) with abnormal findings: Secondary | ICD-10-CM | POA: Diagnosis not present

## 2014-12-15 DIAGNOSIS — Z1151 Encounter for screening for human papillomavirus (HPV): Secondary | ICD-10-CM | POA: Diagnosis not present

## 2014-12-15 DIAGNOSIS — Z124 Encounter for screening for malignant neoplasm of cervix: Secondary | ICD-10-CM | POA: Insufficient documentation

## 2014-12-15 DIAGNOSIS — R1031 Right lower quadrant pain: Secondary | ICD-10-CM | POA: Diagnosis not present

## 2014-12-16 LAB — CYTOLOGY - PAP

## 2014-12-18 ENCOUNTER — Ambulatory Visit (INDEPENDENT_AMBULATORY_CARE_PROVIDER_SITE_OTHER): Payer: Commercial Managed Care - HMO | Admitting: Cardiovascular Disease

## 2014-12-18 ENCOUNTER — Encounter: Payer: Self-pay | Admitting: Cardiovascular Disease

## 2014-12-18 VITALS — BP 130/70 | HR 74 | Ht 65.0 in | Wt 161.6 lb

## 2014-12-18 DIAGNOSIS — I2510 Atherosclerotic heart disease of native coronary artery without angina pectoris: Secondary | ICD-10-CM | POA: Diagnosis not present

## 2014-12-18 DIAGNOSIS — I10 Essential (primary) hypertension: Secondary | ICD-10-CM | POA: Diagnosis not present

## 2014-12-18 DIAGNOSIS — I2583 Coronary atherosclerosis due to lipid rich plaque: Secondary | ICD-10-CM

## 2014-12-18 MED ORDER — POTASSIUM CHLORIDE CRYS ER 20 MEQ PO TBCR: 20.0000 meq | EXTENDED_RELEASE_TABLET | Freq: Every day | ORAL | Status: DC

## 2014-12-18 MED ORDER — VALSARTAN 160 MG PO TABS: 160.0000 mg | ORAL_TABLET | Freq: Every day | ORAL | Status: DC

## 2014-12-18 MED ORDER — FUROSEMIDE 20 MG PO TABS: 20.0000 mg | ORAL_TABLET | Freq: Every day | ORAL | Status: DC

## 2014-12-18 MED ORDER — METOPROLOL TARTRATE 25 MG PO TABS: 12.5000 mg | ORAL_TABLET | Freq: Two times a day (BID) | ORAL | Status: DC

## 2014-12-18 MED ORDER — SIMVASTATIN 40 MG PO TABS: 40.0000 mg | ORAL_TABLET | Freq: Every day | ORAL | Status: DC

## 2014-12-18 NOTE — Assessment & Plan Note (Signed)
History of CAD status post cardiac catheterization performed by myself 01/15/11 revealing a 60% mid LAD lesion. She had anterior take of RCA. Medical therapy was recommended. Since I saw her back a year ago she denied chest pain or shortness of breath.

## 2014-12-18 NOTE — Progress Notes (Signed)
12/18/2014 Megan Hull   Nov 27, 1940  [id number]  [place], [place] Primary Cardiologist: Megan Harp MD Megan Hull   HPI:   The patient is a delightful, 74 year old, mildly overweight, single [name] with no children whom I last saw 12 months ago. Her problems include hypertension, hyperlipidemia, continued tobacco abuse of one-third pack per day, and family history of premature heart disease. She had a negative stress test on December 24, 2007. She was admitted to the [facility] on January 15, 2011, with jaw pain, thought to be an anginal equivalent. I catheterized her, revealing noncritical CAD with a 60% mid LAD lesion. She had anterior takeoff RCA. She has been asymptomatic since. She had carotid Dopplers performed at Dr. Roland Earl office which apparently were unremarkable. A lipid profile performed in March revealed total cholesterol of 142, LDL of 76, and HDL of 47. Her major complaint is of lower extremity edema, which gets worse during the day. I had done venous reflux studies on her back in November which were normal.since I saw her last a year and a half ago she has noticed increasing lower extremity edema which is dependent as well as increasing dyspnea on exertion. She denies chest pain. Her weight is stable same. She does admit to dietary indiscretion with regard to salt. She empirically increased her diuretic therapy. This resulted in improvement in her edema.   Current Outpatient Prescriptions  Medication Sig Dispense Refill  . AMBULATORY NON FORMULARY MEDICATION Magnesium Mist.  Use 5 sprays on skin once per day    . Ascorbic Acid (VITAMIN C) 1000 MG tablet Take 1,000 mg by mouth every morning.     [name] aspirin 81 MG tablet Take 162 mg by mouth every morning.     . cetirizine (ZYRTEC) 10 MG tablet Take 10 mg by mouth daily as needed for allergies.    . furosemide (LASIX) 20 MG tablet Take 1 tablet (20 mg total) by mouth  daily. 90 tablet 3  . metoprolol tartrate (LOPRESSOR) 25 MG tablet Take 0.5 tablets (12.5 mg total) by mouth 2 (two) times daily. 90 tablet 3  . potassium chloride SA (K-DUR,KLOR-CON) 20 MEQ tablet Take 1 tablet (20 mEq total) by mouth daily. 90 tablet 3  . simvastatin (ZOCOR) 40 MG tablet Take 1 tablet (40 mg total) by mouth daily. 90 tablet 3  . valsartan (DIOVAN) 160 MG tablet Take 1 tablet (160 mg total) by mouth daily. Take as directed 90 tablet 1   No current facility-administered medications for this visit.    Allergies  Allergen Reactions  . Codeine     REACTION: chest pain  . Nexium [Esomeprazole Magnesium]     Pt states she was unable to swallow for several hours after taking the nexium  . Other     Red Fish  . Shellfish Allergy     History   Social History  . Marital Status: Single    Spouse Name: N/A  . Number of Children: 0  . Years of Education: N/A   Occupational History  . retired    Social History Main Topics  . Smoking status: Current Every Day Smoker -- 0.30 packs/day for 15 years    Types: Cigarettes  . Smokeless tobacco: Never Used  . Alcohol Use: 8.4 oz/week    14 Standard drinks or equivalent per week     Comment: every night  . Drug Use: No  . Sexual Activity: Not on file  Other Topics Concern  . Not on file   Social History Narrative     Review of Systems: General: negative for chills, fever, night sweats or weight changes.  Cardiovascular: negative for chest pain, dyspnea on exertion, edema, orthopnea, palpitations, paroxysmal nocturnal dyspnea or shortness of breath Dermatological: negative for rash Respiratory: negative for cough or wheezing Urologic: negative for hematuria Abdominal: negative for nausea, vomiting, diarrhea, bright red blood per rectum, melena, or hematemesis Neurologic: negative for visual changes, syncope, or dizziness All other systems reviewed and are otherwise negative except as noted above.    Blood  pressure 130/70, pulse 74, height 5\' 5"  (1.651 m), weight 161 lb 9.6 oz (73.301 kg).  General appearance: alert and no distress Neck: no adenopathy, no carotid bruit, no JVD, supple, symmetrical, trachea midline and thyroid not enlarged, symmetric, no tenderness/mass/nodules Lungs: clear to auscultation bilaterally Heart: regular rate and rhythm, S1, S2 normal, no murmur, click, rub or gallop Extremities: extremities normal, atraumatic, no cyanosis or edema  EKG normal sinus rhythm at 74 without ST or T-wave changes. I personally reviewed this EKG  ASSESSMENT AND PLAN:   Hyperlipidemia History of hyperlipidemia on simvastatin 45 by her PCP  Essential hypertension History of hypertension blood pressure measured at 130/70. She is on metoprolol and valsartan. Continue current meds at current dosing  Coronary artery disease History of CAD status post cardiac catheterization performed by myself 01/15/11 revealing a 60% mid LAD lesion. She had anterior take of RCA. Medical therapy was recommended. Since I saw her back a year ago she denied chest pain or shortness of breath.      Megan Harp MD FACP,FACC,FAHA, [facility] 12/18/2014 9:46 AM

## 2014-12-18 NOTE — Patient Instructions (Signed)
Dr Berry recommends that you schedule a follow-up appointment in 1 year. You will receive a reminder letter in the mail two months in advance. If you don't receive a letter, please call our office to schedule the follow-up appointment. 

## 2014-12-18 NOTE — Assessment & Plan Note (Signed)
History of hypertension blood pressure measured at 130/70. She is on metoprolol and valsartan. Continue current meds at current dosing

## 2014-12-18 NOTE — Assessment & Plan Note (Signed)
History of hyperlipidemia on simvastatin 45 by her PCP

## 2014-12-22 ENCOUNTER — Ambulatory Visit
Admission: RE | Admit: 2014-12-22 | Discharge: 2014-12-22 | Disposition: A | Discharge status: Home / Self Care | Payer: Commercial Managed Care - HMO | Source: Ambulatory Visit | Attending: Family Medicine | Admitting: Family Medicine

## 2014-12-22 DIAGNOSIS — M9971 Connective tissue and disc stenosis of intervertebral foramina of cervical region: Secondary | ICD-10-CM | POA: Diagnosis not present

## 2014-12-22 DIAGNOSIS — M47813 Spondylosis without myelopathy or radiculopathy, cervicothoracic region: Secondary | ICD-10-CM | POA: Diagnosis not present

## 2014-12-22 DIAGNOSIS — M5410 Radiculopathy, site unspecified: Secondary | ICD-10-CM

## 2014-12-22 DIAGNOSIS — M47812 Spondylosis without myelopathy or radiculopathy, cervical region: Secondary | ICD-10-CM | POA: Diagnosis not present

## 2015-01-19 DIAGNOSIS — M79602 Pain in left arm: Secondary | ICD-10-CM | POA: Diagnosis not present

## 2015-01-19 DIAGNOSIS — M5412 Radiculopathy, cervical region: Secondary | ICD-10-CM | POA: Diagnosis not present

## 2015-02-01 DIAGNOSIS — M5012 Cervical disc disorder with radiculopathy, mid-cervical region: Secondary | ICD-10-CM | POA: Diagnosis not present

## 2015-02-04 DIAGNOSIS — M5012 Cervical disc disorder with radiculopathy, mid-cervical region: Secondary | ICD-10-CM | POA: Diagnosis not present

## 2015-02-08 DIAGNOSIS — M5012 Cervical disc disorder with radiculopathy, mid-cervical region: Secondary | ICD-10-CM | POA: Diagnosis not present

## 2015-02-12 DIAGNOSIS — M5012 Cervical disc disorder with radiculopathy, mid-cervical region: Secondary | ICD-10-CM | POA: Diagnosis not present

## 2015-02-18 DIAGNOSIS — M542 Cervicalgia: Secondary | ICD-10-CM | POA: Diagnosis not present

## 2015-02-22 ENCOUNTER — Other Ambulatory Visit: Payer: Self-pay | Admitting: Obstetrics and Gynecology

## 2015-02-22 DIAGNOSIS — M5012 Cervical disc disorder with radiculopathy, mid-cervical region: Secondary | ICD-10-CM | POA: Diagnosis not present

## 2015-02-22 DIAGNOSIS — M542 Cervicalgia: Secondary | ICD-10-CM | POA: Diagnosis not present

## 2015-02-22 DIAGNOSIS — Z1231 Encounter for screening mammogram for malignant neoplasm of breast: Secondary | ICD-10-CM

## 2015-02-23 ENCOUNTER — Encounter: Payer: Self-pay | Admitting: Gastroenterology

## 2015-02-25 DIAGNOSIS — M542 Cervicalgia: Secondary | ICD-10-CM | POA: Diagnosis not present

## 2015-03-01 ENCOUNTER — Ambulatory Visit (HOSPITAL_COMMUNITY)
Admission: RE | Admit: 2015-03-01 | Discharge: 2015-03-01 | Disposition: A | Discharge status: Home / Self Care | Payer: Commercial Managed Care - HMO | Source: Ambulatory Visit | Attending: Obstetrics and Gynecology | Admitting: Obstetrics and Gynecology

## 2015-03-01 DIAGNOSIS — Z1231 Encounter for screening mammogram for malignant neoplasm of breast: Secondary | ICD-10-CM | POA: Diagnosis not present

## 2015-03-08 DIAGNOSIS — M542 Cervicalgia: Secondary | ICD-10-CM | POA: Diagnosis not present

## 2015-03-11 DIAGNOSIS — M542 Cervicalgia: Secondary | ICD-10-CM | POA: Diagnosis not present

## 2015-03-15 DIAGNOSIS — M542 Cervicalgia: Secondary | ICD-10-CM | POA: Diagnosis not present

## 2015-03-22 DIAGNOSIS — M542 Cervicalgia: Secondary | ICD-10-CM | POA: Diagnosis not present

## 2015-04-08 DIAGNOSIS — M5412 Radiculopathy, cervical region: Secondary | ICD-10-CM | POA: Diagnosis not present

## 2015-04-08 DIAGNOSIS — Z6827 Body mass index (BMI) 27.0-27.9, adult: Secondary | ICD-10-CM | POA: Diagnosis not present

## 2015-04-16 ENCOUNTER — Other Ambulatory Visit: Payer: Self-pay | Admitting: Neurological Surgery

## 2015-04-20 DIAGNOSIS — H25813 Combined forms of age-related cataract, bilateral: Secondary | ICD-10-CM | POA: Diagnosis not present

## 2015-04-20 DIAGNOSIS — E119 Type 2 diabetes mellitus without complications: Secondary | ICD-10-CM | POA: Diagnosis not present

## 2015-04-20 DIAGNOSIS — H40013 Open angle with borderline findings, low risk, bilateral: Secondary | ICD-10-CM | POA: Diagnosis not present

## 2015-05-04 DIAGNOSIS — Z0000 Encounter for general adult medical examination without abnormal findings: Secondary | ICD-10-CM | POA: Diagnosis not present

## 2015-05-04 DIAGNOSIS — R7309 Other abnormal glucose: Secondary | ICD-10-CM | POA: Diagnosis not present

## 2015-05-04 DIAGNOSIS — E559 Vitamin D deficiency, unspecified: Secondary | ICD-10-CM | POA: Diagnosis not present

## 2015-05-04 DIAGNOSIS — I471 Supraventricular tachycardia: Secondary | ICD-10-CM | POA: Diagnosis not present

## 2015-05-04 DIAGNOSIS — R1031 Right lower quadrant pain: Secondary | ICD-10-CM | POA: Diagnosis not present

## 2015-05-04 DIAGNOSIS — I10 Essential (primary) hypertension: Secondary | ICD-10-CM | POA: Diagnosis not present

## 2015-05-04 DIAGNOSIS — Z23 Encounter for immunization: Secondary | ICD-10-CM | POA: Diagnosis not present

## 2015-05-04 DIAGNOSIS — E785 Hyperlipidemia, unspecified: Secondary | ICD-10-CM | POA: Diagnosis not present

## 2015-05-05 DIAGNOSIS — I471 Supraventricular tachycardia: Secondary | ICD-10-CM | POA: Diagnosis not present

## 2015-05-05 DIAGNOSIS — I10 Essential (primary) hypertension: Secondary | ICD-10-CM | POA: Diagnosis not present

## 2015-05-05 DIAGNOSIS — E559 Vitamin D deficiency, unspecified: Secondary | ICD-10-CM | POA: Diagnosis not present

## 2015-05-05 DIAGNOSIS — Z0000 Encounter for general adult medical examination without abnormal findings: Secondary | ICD-10-CM | POA: Diagnosis not present

## 2015-05-05 DIAGNOSIS — E785 Hyperlipidemia, unspecified: Secondary | ICD-10-CM | POA: Diagnosis not present

## 2015-05-05 DIAGNOSIS — R1031 Right lower quadrant pain: Secondary | ICD-10-CM | POA: Diagnosis not present

## 2015-05-05 DIAGNOSIS — R7309 Other abnormal glucose: Secondary | ICD-10-CM | POA: Diagnosis not present

## 2015-05-10 ENCOUNTER — Telehealth: Payer: Self-pay | Admitting: Cardiovascular Disease

## 2015-05-10 NOTE — Telephone Encounter (Signed)
Pt aware to contact surgeon's office for clearance notification, if warranted.

## 2015-05-10 NOTE — Telephone Encounter (Signed)
Pt is going to have upper back and neck surgery on 05-25-15. Does she need to be cleared for surgery?

## 2015-05-12 ENCOUNTER — Encounter (HOSPITAL_COMMUNITY): Payer: Self-pay

## 2015-05-12 ENCOUNTER — Encounter (HOSPITAL_COMMUNITY)
Admission: RE | Admit: 2015-05-12 | Discharge: 2015-05-12 | Disposition: A | Discharge status: Home / Self Care | Payer: Commercial Managed Care - HMO | Source: Ambulatory Visit | Attending: Neurological Surgery | Admitting: Neurological Surgery

## 2015-05-12 DIAGNOSIS — Z01818 Encounter for other preprocedural examination: Secondary | ICD-10-CM | POA: Insufficient documentation

## 2015-05-12 DIAGNOSIS — I10 Essential (primary) hypertension: Secondary | ICD-10-CM | POA: Diagnosis not present

## 2015-05-12 DIAGNOSIS — M5412 Radiculopathy, cervical region: Secondary | ICD-10-CM | POA: Diagnosis not present

## 2015-05-12 DIAGNOSIS — Z7982 Long term (current) use of aspirin: Secondary | ICD-10-CM | POA: Insufficient documentation

## 2015-05-12 DIAGNOSIS — Z79899 Other long term (current) drug therapy: Secondary | ICD-10-CM | POA: Insufficient documentation

## 2015-05-12 DIAGNOSIS — K219 Gastro-esophageal reflux disease without esophagitis: Secondary | ICD-10-CM | POA: Diagnosis not present

## 2015-05-12 DIAGNOSIS — Z01812 Encounter for preprocedural laboratory examination: Secondary | ICD-10-CM | POA: Insufficient documentation

## 2015-05-12 DIAGNOSIS — I2510 Atherosclerotic heart disease of native coronary artery without angina pectoris: Secondary | ICD-10-CM | POA: Diagnosis not present

## 2015-05-12 DIAGNOSIS — E785 Hyperlipidemia, unspecified: Secondary | ICD-10-CM | POA: Diagnosis not present

## 2015-05-12 HISTORY — DX: Unspecified osteoarthritis, unspecified site: M19.90

## 2015-05-12 HISTORY — DX: Cardiac murmur, unspecified: R01.1

## 2015-05-12 LAB — BASIC METABOLIC PANEL
Anion gap: 9 (ref 5–15)
BUN: 9 mg/dL (ref 6–20)
CALCIUM: 9.3 mg/dL (ref 8.9–10.3)
CO2: 24 mmol/L (ref 22–32)
Chloride: 106 mmol/L (ref 101–111)
Creatinine, Ser: 0.87 mg/dL (ref 0.44–1.00)
GFR calc Af Amer: >60 mL/min (ref >60)
Glucose, Bld: 107 mg/dL — ABNORMAL HIGH (ref 65–99)
POTASSIUM: 4 mmol/L (ref 3.5–5.1)
SODIUM: 139 mmol/L (ref 135–145)

## 2015-05-12 LAB — GLUCOSE, CAPILLARY: GLUCOSE-CAPILLARY: 159 mg/dL — AB (ref 65–99)

## 2015-05-12 LAB — CBC
HEMATOCRIT: 42.8 % (ref 36.0–46.0)
Hemoglobin: 14.5 g/dL (ref 12.0–15.0)
MCH: 31.7 pg (ref 26.0–34.0)
MCHC: 33.9 g/dL (ref 30.0–36.0)
MCV: 93.7 fL (ref 78.0–100.0)
Platelets: 188 10*3/uL (ref 150–400)
RBC: 4.57 MIL/uL (ref 3.87–5.11)
RDW: 13.1 % (ref 11.5–15.5)
WBC: 5 10*3/uL (ref 4.0–10.5)

## 2015-05-12 LAB — SURGICAL PCR SCREEN
MRSA, PCR: NEGATIVE
STAPHYLOCOCCUS AUREUS: POSITIVE — AB

## 2015-05-12 NOTE — Progress Notes (Signed)
Anesthesia PAT Evaluation: Patient is a 74 year old female scheduled for C5-6, C6-7 ACDF on 05/25/15 by Dr. Ellene Route. (She will be out of town in [place] from 05/13/15-05/21/15.)  History includes CAD, SVT, murmur (normal echo '14), smoking, HTN, HLD, GERD, edema, arthritis, esophageal stricture s/p dilation '14. She was scheduled for EGD with possible dilation (due to symptoms of dysphagia with esophageal spasm) on 06/02/14, but this was canceled due to family illness. Fortunately, patient's symptoms resolved and she denied any significant symptoms for also one year. PCP is Dr. Milagros Evener. GI Dr. Deatra Ina.  Cardiologist is Dr. Gwenlyn Found, last visit 12/18/14. Continued medical therapy for CAD recommended with one year follow-up.  Meds include ASA 81mg , Zyrtec, Lasix, Lopressor, KCl, Zocor, Diovan.   12/18/14 EKG: SR with fusion complexes.  05/29/13 Echo: Study Conclusions - Left ventricle: The cavity size was normal. There was hypertrophy, with an appearance suggesting concentric remodeling (increased wall thickness with normal wall mass). Systolic function was normal. The estimated ejection fraction was in the range of 55% to 65%. Wall motion was normal; there were no regional wall motion abnormalities. Doppler parameters are consistent with abnormal left ventricular relaxation (grade 1 diastolic dysfunction). Doppler parameters are consistent with elevated mean left atrial filling pressure. - Aortic valve: Trivial regurgitation. - Atrial septum: No defect or patent foramen ovale was identified. Impressions: - Normal pulmonary artery pressure.  05/11/11 Nuclear stress test: Normal Myocardial Perfusion Study. Low risk scan. Post stress EF 80%.  01/15/09 LHC: 1. Left main normal. 2. LAD; LAD had 30% proximal stenosis. There was a 50-60% stenosis in  the LAD just after the second large diagonal branch, which appeared  smooth. 3. Circumflex; free of systemic  disease. 4. Right coronary artery; anterior takeoff and subselectively  visualized, but free of systemic disease. 5. Left ventriculography; The overall LVEF was  estimated greater than 60% without focal wall motion abnormalities. (Dr. Kennon Holter note mentions a 01/15/11 cath revealing 60% mid LAD and anterior take off RCA. Medical therapy. I could not locate a cath from this date.)  IMPRESSION: Ms. Lanford has noncritical coronary artery disease with preserved left ventricular function. It is unclear to me that the symptoms are ischemic in nature. Medical therapy including antireflux therapy will be recommended. The patient will require an additional outpatient Myoview to further evaluate for distal ischemia in the LAD territory.  05/17/10 Carotid duplex: IMPRESSION: No hemodynamically significant ICA stenosis.  Preoperative labs noted.  Patient feels well from a cardiac standpoint. She is compliant with her medication regimen. She denied CP, SOB, significant palpitations, syncope, new edema. Has chronic mild LE edema with prolonged standing. She sleeps with her HOB elevated due to reflux and comfort. Activity is more limited since 01/2015 when she developed  RUE tingling, pain with some weakness which lead her to this surgery. She says she is able to walk up two flights of stairs without stopping and still does some yard work without CV symptoms. On exam, heart RRR, no murmur noted. Lungs clear. No significant pre-tibial edema.   She was concerned if GETA would trigger her esophageal spasm. We discussed GETA would not likely affect, but surgery itself would likely make her neck feel full and could notice at least some temporarily discomfort with swallowing. She reports surgeon has already discussed what to expect with her.   Above also reviewed with anesthesiologist Dr. Orene Desanctis. If no new issues then it is anticipated that she can proceed as planned.  Myra Gianotti,  PA-C [facility]  Stay [facility]/Anesthesiology Phone [phone] 05/12/2015 1:29 PM

## 2015-05-12 NOTE — Pre-Procedure Instructions (Addendum)
Megan Hull  05/12/2015      [facility] # [place], [place] [address] [place] [address] Phone: [phone] Fax: [phone]  [place], [place] [address] Phone: [phone] Fax: [phone]    Your procedure is scheduled on 05/25/15  Report to [facility] cone short stay admitting at 845 A.M.  Call this number if you have problems the morning of surgery:  [phone]   Remember:  Do not eat food or drink liquids after midnight.  Take these medicines the morning of surgery with A SIP OF WATER metoprolol   STOP all herbel meds, nsaids (aleve,naproxen,advil,ibuprofen) 5 days prior to surgery starting 05/20/15 including aspirin, vitamins   Do not wear jewelry, make-up or nail polish.  Do not wear lotions, powders, or perfumes.  You may wear deodorant.  Do not shave 48 hours prior to surgery.  Men may shave face and neck.  Do not bring valuables to the hospital.  [facility] is not responsible for any belongings or valuables.  Contacts, dentures or bridgework may not be worn into surgery.  Leave your suitcase in the car.  After surgery it may be brought to your room.  For patients admitted to the hospital, discharge time will be determined by your treatment team.  Patients discharged the day of surgery will not be allowed to drive home.   Name and phone number of your driver:    Special instructions:  Special Instructions: [facility] - Preparing for Surgery  Before surgery, you can play an important role.  Because skin is not sterile, your skin needs to be as free of germs as possible.  You can reduce the number of germs on you skin by washing with CHG (chlorahexidine gluconate) soap before surgery.  CHG is an antiseptic cleaner which kills germs and bonds with the skin to continue killing germs even after washing.  Please DO NOT use if you  have an allergy to CHG or antibacterial soaps.  If your skin becomes reddened/irritated stop using the CHG and inform your nurse when you arrive at Short Stay.  Do not shave (including legs and underarms) for at least 48 hours prior to the first CHG shower.  You may shave your face.  Please follow these instructions carefully:   1.  Shower with CHG Soap the night before surgery and the morning of Surgery.  2.  If you choose to wash your hair, wash your hair first as usual with your normal shampoo.  3.  After you shampoo, rinse your hair and body thoroughly to remove the Shampoo.  4.  Use CHG as you would any other liquid soap.  You can apply chg directly  to the skin and wash gently with scrungie or a clean washcloth.  5.  Apply the CHG Soap to your body ONLY FROM THE NECK DOWN.  Do not use on open wounds or open sores.  Avoid contact with your eyes ears, mouth and genitals (private parts).  Wash genitals (private parts)       with your normal soap.  6.  Wash thoroughly, paying special attention to the area where your surgery will be performed.  7.  Thoroughly rinse your body with warm water from the neck down.  8.  DO NOT shower/wash with your normal soap after using and rinsing off the CHG Soap.  9.  Fraser Din  yourself dry with a clean towel.            10.  Wear clean pajamas.            11.  Place clean sheets on your bed the night of your first shower and do not sleep with pets.  Day of Surgery  Do not apply any lotions/deodorants the morning of surgery.  Please wear clean clothes to the hospital/surgery center.  Please read over the following fact sheets that you were given.  Pain Booklet, surgical site infection prevention,mrsa instructions,coughing and deep breathing instruct

## 2015-05-31 DIAGNOSIS — N882 Stricture and stenosis of cervix uteri: Secondary | ICD-10-CM | POA: Diagnosis not present

## 2015-05-31 DIAGNOSIS — R200 Anesthesia of skin: Secondary | ICD-10-CM | POA: Diagnosis not present

## 2015-06-04 ENCOUNTER — Encounter (HOSPITAL_COMMUNITY): Payer: Self-pay | Admitting: *Deleted

## 2015-06-04 NOTE — Progress Notes (Signed)
Pt states nothing has changed with her medical and surgical history since she was here for her PAT appt Nov. 30, 2016. States she did finish the Mupirocin Ointment treatment for positive staph. She denies any chest pain or sob.

## 2015-06-07 MED ORDER — CEFAZOLIN SODIUM-DEXTROSE 2-3 GM-% IV SOLR
2.0000 g | INTRAVENOUS | Status: AC
  Administered 2015-06-08: 2 g via INTRAVENOUS
  Filled 2015-06-07: qty 50

## 2015-06-08 ENCOUNTER — Encounter (HOSPITAL_COMMUNITY): Payer: Self-pay | Admitting: *Deleted

## 2015-06-08 ENCOUNTER — Inpatient Hospital Stay (HOSPITAL_COMMUNITY): Payer: Commercial Managed Care - HMO | Admitting: Anesthesiology

## 2015-06-08 ENCOUNTER — Inpatient Hospital Stay (HOSPITAL_COMMUNITY)
Admission: RE | Admit: 2015-06-08 | Discharge: 2015-06-09 | DRG: 473 | Disposition: A | Discharge status: Home / Self Care | Payer: Commercial Managed Care - HMO | Source: Ambulatory Visit | Attending: Neurological Surgery | Admitting: Neurological Surgery

## 2015-06-08 ENCOUNTER — Inpatient Hospital Stay (HOSPITAL_COMMUNITY): Payer: Commercial Managed Care - HMO | Admitting: Vascular Surgery

## 2015-06-08 ENCOUNTER — Encounter (HOSPITAL_COMMUNITY)
Admission: RE | Disposition: A | Discharge status: Home / Self Care | Payer: Self-pay | Source: Ambulatory Visit | Attending: Neurological Surgery

## 2015-06-08 ENCOUNTER — Inpatient Hospital Stay (HOSPITAL_COMMUNITY): Payer: Commercial Managed Care - HMO

## 2015-06-08 DIAGNOSIS — F17210 Nicotine dependence, cigarettes, uncomplicated: Secondary | ICD-10-CM | POA: Diagnosis present

## 2015-06-08 DIAGNOSIS — M4712 Other spondylosis with myelopathy, cervical region: Secondary | ICD-10-CM | POA: Diagnosis present

## 2015-06-08 DIAGNOSIS — M79601 Pain in right arm: Secondary | ICD-10-CM | POA: Diagnosis present

## 2015-06-08 DIAGNOSIS — R7303 Prediabetes: Secondary | ICD-10-CM | POA: Diagnosis not present

## 2015-06-08 DIAGNOSIS — I10 Essential (primary) hypertension: Secondary | ICD-10-CM | POA: Diagnosis present

## 2015-06-08 DIAGNOSIS — Z7982 Long term (current) use of aspirin: Secondary | ICD-10-CM

## 2015-06-08 DIAGNOSIS — K219 Gastro-esophageal reflux disease without esophagitis: Secondary | ICD-10-CM | POA: Diagnosis not present

## 2015-06-08 DIAGNOSIS — Z79899 Other long term (current) drug therapy: Secondary | ICD-10-CM | POA: Diagnosis not present

## 2015-06-08 DIAGNOSIS — M50322 Other cervical disc degeneration at C5-C6 level: Secondary | ICD-10-CM | POA: Diagnosis not present

## 2015-06-08 DIAGNOSIS — Z419 Encounter for procedure for purposes other than remedying health state, unspecified: Secondary | ICD-10-CM

## 2015-06-08 DIAGNOSIS — M4722 Other spondylosis with radiculopathy, cervical region: Secondary | ICD-10-CM | POA: Diagnosis not present

## 2015-06-08 DIAGNOSIS — M1990 Unspecified osteoarthritis, unspecified site: Secondary | ICD-10-CM | POA: Diagnosis not present

## 2015-06-08 DIAGNOSIS — M5412 Radiculopathy, cervical region: Secondary | ICD-10-CM | POA: Diagnosis not present

## 2015-06-08 HISTORY — PX: ANTERIOR CERVICAL DECOMP/DISCECTOMY FUSION: SHX1161

## 2015-06-08 HISTORY — DX: Type 2 diabetes mellitus without complications: E11.9

## 2015-06-08 LAB — GLUCOSE, CAPILLARY
GLUCOSE-CAPILLARY: 70 mg/dL (ref 65–99)
GLUCOSE-CAPILLARY: 136 mg/dL — AB (ref 65–99)
GLUCOSE-CAPILLARY: 151 mg/dL — AB (ref 65–99)
Glucose-Capillary: 103 mg/dL — ABNORMAL HIGH (ref 65–99)

## 2015-06-08 LAB — BASIC METABOLIC PANEL
ANION GAP: 9 (ref 5–15)
BUN: 13 mg/dL (ref 6–20)
CALCIUM: 9 mg/dL (ref 8.9–10.3)
CO2: 24 mmol/L (ref 22–32)
Chloride: 107 mmol/L (ref 101–111)
Creatinine, Ser: 0.94 mg/dL (ref 0.44–1.00)
GFR calc non Af Amer: 58 mL/min — ABNORMAL LOW (ref >60)
Glucose, Bld: 106 mg/dL — ABNORMAL HIGH (ref 65–99)
POTASSIUM: 4.3 mmol/L (ref 3.5–5.1)
Sodium: 140 mmol/L (ref 135–145)

## 2015-06-08 LAB — CBC
HEMATOCRIT: 40.8 % (ref 36.0–46.0)
HEMOGLOBIN: 13.7 g/dL (ref 12.0–15.0)
MCH: 31.6 pg (ref 26.0–34.0)
MCHC: 33.6 g/dL (ref 30.0–36.0)
MCV: 94 fL (ref 78.0–100.0)
Platelets: 191 10*3/uL (ref 150–400)
RBC: 4.34 MIL/uL (ref 3.87–5.11)
RDW: 13 % (ref 11.5–15.5)
WBC: 4.9 10*3/uL (ref 4.0–10.5)

## 2015-06-08 SURGERY — ANTERIOR CERVICAL DECOMPRESSION/DISCECTOMY FUSION 2 LEVELS
Anesthesia: General

## 2015-06-08 MED ORDER — IRBESARTAN 75 MG PO TABS
37.5000 mg | ORAL_TABLET | Freq: Every day | ORAL | Status: DC
Start: 2015-06-08 — End: 2015-06-09
  Administered 2015-06-08: 37.5 mg via ORAL
  Filled 2015-06-08 (×2): qty 0.5

## 2015-06-08 MED ORDER — HYDROMORPHONE HCL 1 MG/ML IJ SOLN: 0.5000 mg | INTRAMUSCULAR | Status: DC | PRN

## 2015-06-08 MED ORDER — SIMVASTATIN 40 MG PO TABS
40.0000 mg | ORAL_TABLET | Freq: Every day | ORAL | Status: DC
  Filled 2015-06-08: qty 1

## 2015-06-08 MED ORDER — SODIUM CHLORIDE 0.9 % IJ SOLN: 3.0000 mL | Freq: Two times a day (BID) | INTRAMUSCULAR | Status: DC

## 2015-06-08 MED ORDER — FENTANYL CITRATE (PF) 250 MCG/5ML IJ SOLN
INTRAMUSCULAR | Status: AC
  Filled 2015-06-08: qty 5

## 2015-06-08 MED ORDER — POLYETHYL GLYCOL-PROPYL GLYCOL 0.4-0.3 % OP GEL: OPHTHALMIC | Status: DC | PRN

## 2015-06-08 MED ORDER — LIDOCAINE-EPINEPHRINE 1 %-1:100000 IJ SOLN
INTRAMUSCULAR | Status: DC | PRN
  Administered 2015-06-08: 6 mL

## 2015-06-08 MED ORDER — CEFAZOLIN SODIUM 1-5 GM-% IV SOLN
1.0000 g | Freq: Three times a day (TID) | INTRAVENOUS | Status: DC
  Administered 2015-06-08: 1 g via INTRAVENOUS
  Filled 2015-06-08: qty 50

## 2015-06-08 MED ORDER — POLYVINYL ALCOHOL 1.4 % OP SOLN
1.0000 [drp] | OPHTHALMIC | Status: DC | PRN
  Filled 2015-06-08: qty 15

## 2015-06-08 MED ORDER — ACETAMINOPHEN 325 MG PO TABS: 650.0000 mg | ORAL_TABLET | ORAL | Status: DC | PRN

## 2015-06-08 MED ORDER — ONDANSETRON HCL 4 MG/2ML IJ SOLN: 4.0000 mg | Freq: Once | INTRAMUSCULAR | Status: DC | PRN

## 2015-06-08 MED ORDER — DEXAMETHASONE SODIUM PHOSPHATE 10 MG/ML IJ SOLN
INTRAMUSCULAR | Status: AC
  Filled 2015-06-08: qty 1

## 2015-06-08 MED ORDER — THROMBIN 5000 UNITS EX SOLR
CUTANEOUS | Status: DC | PRN
  Administered 2015-06-08 (×2): 5000 [IU] via TOPICAL

## 2015-06-08 MED ORDER — DOCUSATE SODIUM 100 MG PO CAPS
100.0000 mg | ORAL_CAPSULE | Freq: Two times a day (BID) | ORAL | Status: DC
  Administered 2015-06-08: 100 mg via ORAL
  Filled 2015-06-08: qty 1

## 2015-06-08 MED ORDER — LORATADINE 10 MG PO TABS: 10.0000 mg | ORAL_TABLET | Freq: Every day | ORAL | Status: DC

## 2015-06-08 MED ORDER — MENTHOL 3 MG MT LOZG: 1.0000 | LOZENGE | OROMUCOSAL | Status: DC | PRN

## 2015-06-08 MED ORDER — FENTANYL CITRATE (PF) 100 MCG/2ML IJ SOLN: 25.0000 ug | INTRAMUSCULAR | Status: DC | PRN

## 2015-06-08 MED ORDER — ROCURONIUM BROMIDE 100 MG/10ML IV SOLN
INTRAVENOUS | Status: DC | PRN
  Administered 2015-06-08: 50 mg via INTRAVENOUS

## 2015-06-08 MED ORDER — BUPIVACAINE HCL (PF) 0.5 % IJ SOLN
INTRAMUSCULAR | Status: DC | PRN
  Administered 2015-06-08: 6 mL

## 2015-06-08 MED ORDER — MAGNESIUM CITRATE PO SOLN
1.0000 | Freq: Once | ORAL | Status: DC | PRN
  Filled 2015-06-08: qty 296

## 2015-06-08 MED ORDER — SODIUM CHLORIDE 0.9 % IR SOLN
Status: DC | PRN
  Administered 2015-06-08: 16:00:00

## 2015-06-08 MED ORDER — NEOSTIGMINE METHYLSULFATE 10 MG/10ML IV SOLN
INTRAVENOUS | Status: DC | PRN
  Administered 2015-06-08: 3 mg via INTRAVENOUS

## 2015-06-08 MED ORDER — DEXAMETHASONE SODIUM PHOSPHATE 10 MG/ML IJ SOLN
INTRAMUSCULAR | Status: DC | PRN
  Administered 2015-06-08: 10 mg via INTRAVENOUS

## 2015-06-08 MED ORDER — ONDANSETRON HCL 4 MG/2ML IJ SOLN: 4.0000 mg | INTRAMUSCULAR | Status: DC | PRN

## 2015-06-08 MED ORDER — FENTANYL CITRATE (PF) 100 MCG/2ML IJ SOLN
INTRAMUSCULAR | Status: DC | PRN
  Administered 2015-06-08: 100 ug via INTRAVENOUS
  Administered 2015-06-08: 50 ug via INTRAVENOUS
  Administered 2015-06-08: 100 ug via INTRAVENOUS
  Administered 2015-06-08: 50 ug via INTRAVENOUS

## 2015-06-08 MED ORDER — SENNA 8.6 MG PO TABS
1.0000 | ORAL_TABLET | Freq: Two times a day (BID) | ORAL | Status: DC
  Administered 2015-06-08: 8.6 mg via ORAL
  Filled 2015-06-08: qty 1

## 2015-06-08 MED ORDER — HYDROCODONE-ACETAMINOPHEN 5-325 MG PO TABS: 1.0000 | ORAL_TABLET | ORAL | Status: DC | PRN

## 2015-06-08 MED ORDER — SODIUM CHLORIDE 0.9 % IV SOLN: 250.0000 mL | INTRAVENOUS | Status: DC

## 2015-06-08 MED ORDER — ROCURONIUM BROMIDE 50 MG/5ML IV SOLN
INTRAVENOUS | Status: AC
  Filled 2015-06-08: qty 1

## 2015-06-08 MED ORDER — LIDOCAINE HCL (CARDIAC) 20 MG/ML IV SOLN
INTRAVENOUS | Status: DC | PRN
  Administered 2015-06-08: 60 mg via INTRAVENOUS

## 2015-06-08 MED ORDER — OXYCODONE-ACETAMINOPHEN 5-325 MG PO TABS
1.0000 | ORAL_TABLET | ORAL | Status: DC | PRN
  Administered 2015-06-08 – 2015-06-09 (×2): 1 via ORAL
  Filled 2015-06-08 (×2): qty 1

## 2015-06-08 MED ORDER — FUROSEMIDE 20 MG PO TABS: 10.0000 mg | ORAL_TABLET | Freq: Every day | ORAL | Status: DC

## 2015-06-08 MED ORDER — METHOCARBAMOL 500 MG PO TABS
500.0000 mg | ORAL_TABLET | Freq: Four times a day (QID) | ORAL | Status: DC | PRN
  Administered 2015-06-08 – 2015-06-09 (×2): 500 mg via ORAL
  Filled 2015-06-08 (×3): qty 1

## 2015-06-08 MED ORDER — PHENOL 1.4 % MT LIQD: 1.0000 | OROMUCOSAL | Status: DC | PRN

## 2015-06-08 MED ORDER — 0.9 % SODIUM CHLORIDE (POUR BTL) OPTIME
TOPICAL | Status: DC | PRN
  Administered 2015-06-08: 1000 mL

## 2015-06-08 MED ORDER — ONDANSETRON HCL 4 MG/2ML IJ SOLN
INTRAMUSCULAR | Status: DC | PRN
  Administered 2015-06-08: 4 mg via INTRAVENOUS

## 2015-06-08 MED ORDER — BISACODYL 10 MG RE SUPP
10.0000 mg | Freq: Every day | RECTAL | Status: DC | PRN
Start: 2015-06-08 — End: 2015-06-09

## 2015-06-08 MED ORDER — GLYCOPYRROLATE 0.2 MG/ML IJ SOLN
INTRAMUSCULAR | Status: DC | PRN
  Administered 2015-06-08: 0.4 mg via INTRAVENOUS

## 2015-06-08 MED ORDER — POLYETHYLENE GLYCOL 3350 17 G PO PACK: 17.0000 g | PACK | Freq: Every day | ORAL | Status: DC | PRN

## 2015-06-08 MED ORDER — SODIUM CHLORIDE 0.9 % IJ SOLN: 3.0000 mL | INTRAMUSCULAR | Status: DC | PRN

## 2015-06-08 MED ORDER — ALUM & MAG HYDROXIDE-SIMETH 200-200-20 MG/5ML PO SUSP: 30.0000 mL | Freq: Four times a day (QID) | ORAL | Status: DC | PRN

## 2015-06-08 MED ORDER — METOPROLOL TARTRATE 12.5 MG HALF TABLET
12.5000 mg | ORAL_TABLET | Freq: Two times a day (BID) | ORAL | Status: DC
  Administered 2015-06-08: 12.5 mg via ORAL
  Filled 2015-06-08: qty 1

## 2015-06-08 MED ORDER — LACTATED RINGERS IV SOLN
INTRAVENOUS | Status: DC | PRN
  Administered 2015-06-08: 16:00:00 via INTRAVENOUS

## 2015-06-08 MED ORDER — PROPOFOL 10 MG/ML IV BOLUS
INTRAVENOUS | Status: DC | PRN
  Administered 2015-06-08: 200 mg via INTRAVENOUS

## 2015-06-08 MED ORDER — METHOCARBAMOL 1000 MG/10ML IJ SOLN
500.0000 mg | Freq: Four times a day (QID) | INTRAVENOUS | Status: DC | PRN
  Filled 2015-06-08: qty 5

## 2015-06-08 MED ORDER — POTASSIUM CHLORIDE CRYS ER 20 MEQ PO TBCR
20.0000 meq | EXTENDED_RELEASE_TABLET | Freq: Every day | ORAL | Status: DC
  Administered 2015-06-08: 20 meq via ORAL
  Filled 2015-06-08: qty 1

## 2015-06-08 MED ORDER — HEMOSTATIC AGENTS (NO CHARGE) OPTIME
TOPICAL | Status: DC | PRN
  Administered 2015-06-08: 1 via TOPICAL

## 2015-06-08 MED ORDER — ACETAMINOPHEN 650 MG RE SUPP: 650.0000 mg | RECTAL | Status: DC | PRN

## 2015-06-08 MED ORDER — LIDOCAINE HCL (CARDIAC) 20 MG/ML IV SOLN
INTRAVENOUS | Status: AC
  Filled 2015-06-08: qty 5

## 2015-06-08 SURGICAL SUPPLY — 55 items
ADH SKN CLS APL DERMABOND .7 (GAUZE/BANDAGES/DRESSINGS) ×1
ALLOGRAFT TRIAD LORDOTIC CC (Bone Implant) ×2 IMPLANT
BAG DECANTER FOR FLEXI CONT (MISCELLANEOUS) ×2 IMPLANT
BIT DRILL NEURO 2X3.1 SFT TUCH (MISCELLANEOUS) ×1 IMPLANT
BIT DRILL POWER (BIT) IMPLANT
BNDG GAUZE ELAST 4 BULKY (GAUZE/BANDAGES/DRESSINGS) IMPLANT
BUR BARREL STRAIGHT FLUTE 4.0 (BURR) ×2 IMPLANT
CANISTER SUCT 3000ML PPV (MISCELLANEOUS) ×2 IMPLANT
DECANTER SPIKE VIAL GLASS SM (MISCELLANEOUS) ×2 IMPLANT
DERMABOND ADVANCED (GAUZE/BANDAGES/DRESSINGS) ×1
DERMABOND ADVANCED .7 DNX12 (GAUZE/BANDAGES/DRESSINGS) ×1 IMPLANT
DRAPE LAPAROTOMY 100X72 PEDS (DRAPES) ×2 IMPLANT
DRAPE MICROSCOPE LEICA (MISCELLANEOUS) IMPLANT
DRAPE POUCH INSTRU U-SHP 10X18 (DRAPES) ×2 IMPLANT
DRILL BIT POWER (BIT) ×2
DRILL NEURO 2X3.1 SOFT TOUCH (MISCELLANEOUS) ×2
DURAPREP 6ML APPLICATOR 50/CS (WOUND CARE) ×2 IMPLANT
ELECT REM PT RETURN 9FT ADLT (ELECTROSURGICAL) ×2
ELECTRODE REM PT RTRN 9FT ADLT (ELECTROSURGICAL) ×1 IMPLANT
GAUZE SPONGE 4X4 12PLY STRL (GAUZE/BANDAGES/DRESSINGS) ×2 IMPLANT
GAUZE SPONGE 4X4 16PLY XRAY LF (GAUZE/BANDAGES/DRESSINGS) IMPLANT
GLOVE BIO SURGEON STRL SZ8 (GLOVE) ×1 IMPLANT
GLOVE BIO SURGEON STRL SZ8.5 (GLOVE) ×1 IMPLANT
GLOVE BIOGEL PI IND STRL 8.5 (GLOVE) ×1 IMPLANT
GLOVE BIOGEL PI INDICATOR 8.5 (GLOVE) ×1
GLOVE ECLIPSE 8.5 STRL (GLOVE) ×2 IMPLANT
GLOVE EXAM NITRILE LRG STRL (GLOVE) IMPLANT
GLOVE EXAM NITRILE MD LF STRL (GLOVE) IMPLANT
GLOVE EXAM NITRILE XL STR (GLOVE) IMPLANT
GLOVE EXAM NITRILE XS STR PU (GLOVE) IMPLANT
GLOVE INDICATOR 7.5 STRL GRN (GLOVE) ×1 IMPLANT
GLOVE SURG SS PI 7.5 STRL IVOR (GLOVE) ×2 IMPLANT
GOWN STRL REUS W/ TWL LRG LVL3 (GOWN DISPOSABLE) IMPLANT
GOWN STRL REUS W/ TWL XL LVL3 (GOWN DISPOSABLE) IMPLANT
GOWN STRL REUS W/TWL 2XL LVL3 (GOWN DISPOSABLE) ×2 IMPLANT
GOWN STRL REUS W/TWL LRG LVL3 (GOWN DISPOSABLE)
GOWN STRL REUS W/TWL XL LVL3 (GOWN DISPOSABLE)
HALTER HD/CHIN CERV TRACTION D (MISCELLANEOUS) ×2 IMPLANT
KIT BASIN OR (CUSTOM PROCEDURE TRAY) ×2 IMPLANT
KIT ROOM TURNOVER OR (KITS) ×2 IMPLANT
NDL SPNL 22GX3.5 QUINCKE BK (NEEDLE) ×1 IMPLANT
NEEDLE HYPO 22GX1.5 SAFETY (NEEDLE) ×2 IMPLANT
NEEDLE SPNL 22GX3.5 QUINCKE BK (NEEDLE) ×2 IMPLANT
NS IRRIG 1000ML POUR BTL (IV SOLUTION) ×2 IMPLANT
PACK LAMINECTOMY NEURO (CUSTOM PROCEDURE TRAY) ×2 IMPLANT
PAD ARMBOARD 7.5X6 YLW CONV (MISCELLANEOUS) ×6 IMPLANT
PLATE ARCHON 2-LEVEL 38MM (Plate) ×1 IMPLANT
RUBBERBAND STERILE (MISCELLANEOUS) IMPLANT
SCREW ARCHON SELFTAP 4.0X13 (Screw) ×6 IMPLANT
SPONGE INTESTINAL PEANUT (DISPOSABLE) ×2 IMPLANT
SPONGE SURGIFOAM ABS GEL SZ50 (HEMOSTASIS) ×2 IMPLANT
SUT VIC AB 3-0 SH 8-18 (SUTURE) ×4 IMPLANT
TOWEL OR 17X24 6PK STRL BLUE (TOWEL DISPOSABLE) ×2 IMPLANT
TOWEL OR 17X26 10 PK STRL BLUE (TOWEL DISPOSABLE) ×2 IMPLANT
WATER STERILE IRR 1000ML POUR (IV SOLUTION) ×2 IMPLANT

## 2015-06-08 NOTE — Anesthesia Procedure Notes (Signed)
Procedure Name: Intubation Date/Time: 06/08/2015 4:38 PM Performed by: Melina Copa, Samit Sylve R Pre-anesthesia Checklist: Patient identified, Emergency Drugs available, Suction available, Patient being monitored and Timeout performed Patient Re-evaluated:Patient Re-evaluated prior to inductionOxygen Delivery Method: Circle system utilized Preoxygenation: Pre-oxygenation with 100% oxygen Intubation Type: IV induction Ventilation: Mask ventilation without difficulty Laryngoscope Size: Mac and 3 Grade View: Grade II Tube type: Oral Tube size: 7.5 mm Number of attempts: 1 Airway Equipment and Method: Stylet Placement Confirmation: ETT inserted through vocal cords under direct vision,  positive ETCO2 and breath sounds checked- equal and bilateral Secured at: 22 cm Tube secured with: Tape Dental Injury: Teeth and Oropharynx as per pre-operative assessment

## 2015-06-08 NOTE — Anesthesia Preprocedure Evaluation (Addendum)
Anesthesia Evaluation  Patient identified by MRN, date of birth, ID band Patient awake    Reviewed: Allergy & Precautions, NPO status , Patient's Chart, lab work & pertinent test results, reviewed documented beta blocker date and time   History of Anesthesia Complications Negative for: history of anesthetic complications  Airway Mallampati: III  TM Distance: >3 FB Neck ROM: Full    Dental  (+) Dental Advisory Given, Upper Dentures, Lower Dentures, Edentulous Upper, Edentulous Lower   Pulmonary neg recent URI, Current Smoker (5-6 cigarettes per day),    Pulmonary exam normal breath sounds clear to auscultation       Cardiovascular Exercise Tolerance: Good hypertension, Pt. on medications and Pt. on home beta blockers (-) angina+ CAD  (-) Past MI Normal cardiovascular exam+ dysrhythmias (SVT)  Rhythm:Regular Rate:Normal  Chronic lower extremity edema.  12/18/14 EKG: SR with fusion complexes.  05/29/13 Echo: Study Conclusions - Left ventricle: The cavity size was normal. There was hypertrophy, with an appearance suggesting concentric remodeling (increased wall thickness with normal wall mass). Systolic function was normal. The estimated ejection fraction was in the range of 55% to 65%. Wall motion was normal; there were no regional wall motion abnormalities. Doppler parameters are consistent with abnormal left ventricular relaxation (grade 1 diastolic dysfunction). Doppler parameters are consistent with elevated mean left atrial filling pressure. - Aortic valve: Trivial regurgitation. - Atrial septum: No defect or patent foramen ovale was identified. Impressions: - Normal pulmonary artery pressure.  05/11/11 Nuclear stress test: Normal Myocardial Perfusion Study. Low risk scan. Post stress EF 80%.  01/15/09 LHC: 1. Left main normal. 2. LAD; LAD had 30% proximal stenosis. There was a 50-60% stenosis in  the LAD just after the second large diagonal branch, which appeared smooth. 3. Circumflex; free of systemic disease. 4. Right coronary artery; anterior takeoff and subselectively visualized, but free of systemic disease. 5. Left ventriculography; The overall LVEF was estimated greater than 60% without focal wall motion abnormalities.   Neuro/Psych BUE numbness and tingling Right > Left negative psych ROS   GI/Hepatic Neg liver ROS, GERD  ,Status post dilation of esophageal narrowing   Endo/Other  diabetes, Type 2  Renal/GU negative Renal ROS     Musculoskeletal  (+) Arthritis , Osteoarthritis,    Abdominal   Peds  Hematology negative hematology ROS (+)   Anesthesia Other Findings Day of surgery medications reviewed with the patient.  Reproductive/Obstetrics negative OB ROS                           Anesthesia Physical Anesthesia Plan  ASA: III  Anesthesia Plan: General   Post-op Pain Management:    Induction: Intravenous  Airway Management Planned: Oral ETT  Additional Equipment:   Intra-op Plan:   Post-operative Plan: Extubation in OR  Informed Consent: I have reviewed the patients History and Physical, chart, labs and discussed the procedure including the risks, benefits and alternatives for the proposed anesthesia with the patient or authorized representative who has indicated his/her understanding and acceptance.   Dental advisory given  Plan Discussed with: CRNA  Anesthesia Plan Comments: (Risks/benefits of general anesthesia discussed with patient including risk of damage to teeth, lips, gum, and tongue, nausea/vomiting, allergic reactions to medications, and the possibility of heart attack, stroke and death.  All patient questions answered.  Patient wishes to proceed.)        Anesthesia Quick Evaluation

## 2015-06-08 NOTE — Op Note (Signed)
Date of surgery: 06/08/2015 Preoperative diagnosis: Cervical spondylosis with radiculopathy C5-6 and C6-C7 Postoperative diagnosis: Cervical spondylosis with radiculopathy C5-6 and C6-C7. Procedure: Anterior cervical decompression and arthrodesis C5-6 and C6-C7 structural allograft, anterior plate fixation [id number]. Surgeon: Kristeen Miss First assistant: Earle Gell M.D. Anesthesia: Gen. endotracheal Indications: Megan Hull is a 74 year old individual who's had neck shoulder and right arm pain for a number of months she has failed all manner of conservative effort and has had increasing pain and now weakness in the arm particularly in the region of the triceps and grip. She is advised regarding the need for surgical decompression and stabilization of C5-6 and C6-C7 is now taken to the operating room.  Procedure: The patient was brought to the operating room supine on a stretcher. She is placed on the operating table and after the smooth induction of general endotracheal anesthesia, she is placed in 5 pounds of halter traction. The neck was prepped with alcohol and DuraPrep and draped in a sterile fashion. A transverse incision was made in the left side of the neck and the dissection was carried down through the platysma. The prevertebral space was reached and first disc space was identified at C5-C6. Then by stripping lungs: Muscle off at either side of midline self-retaining Caspar type retractor was placed into the wound. The C5-6 disc space was opened with a 15 blade and a combination of curettes and rongeurs was used to remove the vast majority of the disc which was markedly degenerated. As region of the posterior longitudinal ligament was reached some osteophytic overgrowth from the inferior margin body of C5 was encountered. This was drilled away. Then a 2 mm Kerrison punch was carefully used to open the posterior longitudinal ligament and decompress the central portion of the canal out to and  including the lateral recess. An uncinate spur was drilled off on the right and then on the left. Lateral recesses were well decompressed. A 6 mm tricortical graft was then fitted and to the interspace. Attention was turned C6-C7. Here a similar process was carried out removing a significant quantity of severely degenerated disc material and uncinate process spurs. A 6 mm graft was also placed into the interspace after the decompression was completed. Traction was then removed. A 38 mm standard size nuvasive type plate was fitted to the ventral aspect of the vertebral bodies with 6 variable angle 13 mm long screws. These were locked into position. Final radiographs identified good position of the surgical construct. Hemostasis was then carefully checked and the prevertebral space and when this was verified, the retractors were removed and the platysma was closed with 30 Vicryls interrupted fashion and 30 Vicryls used in the subcuticular skin. Dermabond was placed on the skin. The patient tolerated procedure well.

## 2015-06-08 NOTE — Transfer of Care (Signed)
Immediate Anesthesia Transfer of Care Note  Patient: Megan Hull  Procedure(s) Performed: Procedure(s): CERVICAL FIVE-SIX ,CERVICAL SIX-SEVEN ANTERIOR CERVICAL DECOMPRESSION/DISCECTOMY FUSION PLATING BONEGRAFT (N/A)  Patient Location: PACU  Anesthesia Type:General  Level of Consciousness: awake, oriented and patient cooperative  Airway & Oxygen Therapy: Patient Spontanous Breathing and Patient connected to nasal cannula oxygen  Post-op Assessment: Report given to RN, Post -op Vital signs reviewed and stable and Patient moving all extremities  Post vital signs: Reviewed and stable  Last Vitals:  Filed Vitals:   06/08/15 1225 06/08/15 1842  BP: 165/94   Pulse: 77   Temp: 36.6 C 36.5 C  Resp: 20     Complications: No apparent anesthesia complications

## 2015-06-08 NOTE — Progress Notes (Signed)
Patient ID: Megan Hull, female   DOB: 08-Mar-1941, 74 y.o.   MRN: [id number] Vital signs are stable Motor function appears intact I've talked to the family and described the patient's situation to them She is tolerated the surgery well Hopeful for discharge in the morning

## 2015-06-08 NOTE — Anesthesia Postprocedure Evaluation (Signed)
Anesthesia Post Note  Patient: Megan Hull  Procedure(s) Performed: Procedure(s) (LRB): CERVICAL FIVE-SIX ,CERVICAL SIX-SEVEN ANTERIOR CERVICAL DECOMPRESSION/DISCECTOMY FUSION PLATING BONEGRAFT (N/A)  Patient location during evaluation: PACU Anesthesia Type: General Level of consciousness: awake and alert Pain management: pain level controlled Vital Signs Assessment: post-procedure vital signs reviewed and stable Respiratory status: spontaneous breathing, nonlabored ventilation, respiratory function stable and patient connected to nasal cannula oxygen Cardiovascular status: blood pressure returned to baseline and stable Postop Assessment: no signs of nausea or vomiting Anesthetic complications: no    Last Vitals:  Filed Vitals:   06/08/15 1930 06/08/15 1938  BP: 176/87   Pulse: 86 82  Temp:  36.6 C  Resp: 11 14    Last Pain:  Filed Vitals:   06/08/15 1939  PainSc: 0-No pain    LLE Motor Response: Purposeful movement;Responds to commands (06/08/15 1938) LLE Sensation: No numbness (06/08/15 1938) RLE Motor Response: Purposeful movement;Responds to commands (06/08/15 1938) RLE Sensation: No numbness (06/08/15 1938)      [name]

## 2015-06-08 NOTE — H&P (Signed)
CHIEF COMPLAINT:                                          Pain, numbness and tingling in her right arm for several month's time.                                                    HISTORY OF PRESENT ILLNESS:                     Megan Hull is a 74 year old, right-handed individual who tells me that initially she started having some symptoms of numbness in her left hand.  This seemed to resolve itself spontaneously and it was mostly in the fourth and fifth digits on that side.  She then noted that pain started to develop with tingling in her right arm.  This would radiate from the region of the shoulder down into the forearm and the entirety of that right hand.  This problems seems to be more persistent.  She also notes pain and discomfort in the subscapular region but she has not had any significant neck pain.    IMAGING STUDIES:                                          She did have an MRI of the cervical spine and today in the office I have had the opportunity to review the study that was performed on July 12th, which demonstrates that there is central bulging of the disk at the C5-6 level and again at C6-C7, with C6-C7 demonstrating biforaminal stenosis.  There is some left-sided foraminal stenosis at the level of C5-C6.  There is effacement of the left side of the cord at the C5-6 level but there is no complete compression of the cord at any level.  Today in the office to further her workup I obtained a lateral, flexion/extension cervical spine film plus a singular AP view.  The AP view demonstrates that her coronal alignment is excellent.  There is no evidence of a cervical rib.  She has normal range of motion between flexion and extension on the lateral views with some mild to moderate disk height loss at C5-6 and again at C6-C7 with some small bony posterior osteophytes at each of these levels.  The other disk levels demonstrate normal motion and normal morphology.   REVIEW OF SYSTEMS:                                     Systems review is notable for wearing of glasses, a balance disturbance, inability to smell, sinus problems, sinus headaches, high blood pressure, heart murmur, swelling in the feet and hands, indigestion and pain with eating and the borderline diabetes as noted on the 14-point review sheet.  PAST MEDICAL HISTORY:                                Her past medical history reveals that she has some hypertension.  She has got some borderline diabetes and she has some upper gastroenterology symptoms with esophageal stricture in which she gets dilated from time to time.  . Medications and Allergies:  Medications include potassium, valsartan, metoprolol, simvastatin, furosemide and aspirin.  SOCIAL HISTORY:                                            Her personal history reveals that she smokes about seven cigarettes or so a day.  She drinks alcohol on a daily basis, one at night.  PHYSICAL EXAMINATION:                                Her height and weight have been stable at 160 pounds, 5 feet 5.5 inches.  NEUROLOGICAL EXAMINATION:                       On physical examination I note that her range of motion is excellent in the neck.  She turns 70 degrees to either side, flexes and extends fully.  Axial compression does not reproduce any pain.  There is no tenderness or mass in the supraclavicular fossa but there is direct tenderness to palpation over the subscapular region.  Supraspinatus is nontender to palpation.  Her range of motion in the right shoulder is good; however, on internal rotation with the arm extended forward she does reproduce some pain in the region of the shoulder itself.  Motor function is good in the deltoids, the biceps, the triceps, the grips and the instrinsics bilaterally with normal tone and bulk.  IMPRESSION:                 Megan Hull has had significant pain in left shoulder and arm for over 6 months. Despite efforts at conservative management she has continued  symptoms and this has waxed and waned but not gone away despite her best efforts and considerable use of medication. After careful consideration we discussed surgical intervention decompress and stabilize C5-6 and C6-C7. She is now admitted to undergo this surgical procedure. Her questions have been answered and surgery is been displayed on a model in the office. She is ready to proceed with this decompression and fusion at C5-6 and C6-C7.

## 2015-06-09 ENCOUNTER — Encounter (HOSPITAL_COMMUNITY): Payer: Self-pay | Admitting: Neurological Surgery

## 2015-06-09 LAB — GLUCOSE, CAPILLARY: GLUCOSE-CAPILLARY: 172 mg/dL — AB (ref 65–99)

## 2015-06-09 MED ORDER — DIAZEPAM 5 MG PO TABS: 5.0000 mg | ORAL_TABLET | Freq: Four times a day (QID) | ORAL | Status: DC | PRN

## 2015-06-09 MED ORDER — OXYCODONE-ACETAMINOPHEN 5-325 MG PO TABS: 1.0000 | ORAL_TABLET | ORAL | Status: DC | PRN

## 2015-06-09 NOTE — Progress Notes (Signed)
Orthopedic Tech Progress Note Patient Details:  Megan Hull [birth year] [id number]  Ortho Devices Type of Ortho Device: Soft collar Ortho Device/Splint Location: neck Ortho Device/Splint Interventions: Application   Shawntee Mainwaring 06/09/2015, 11:41 AM

## 2015-06-09 NOTE — Progress Notes (Signed)
Pt given D/C instructions with Rx's, verbal understanding was provided. Pt's incision is clean and dry with no sign of infection. Pt's IV was removed prior to D/C. Pt D/C'd home via wheelchair @ 1150 per MD order. Pt is stable @ D/C and has no other needs at this time. Holli Humbles, RN

## 2015-06-09 NOTE — Discharge Summary (Signed)
Physician Discharge Summary  Patient ID: Megan Hull MRN: [id number] DOB/AGE: May 21, 1941 74 y.o.  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 She has tolerated surgery well. She is discharged home.  Consults: None  Significant Diagnostic Studies: None  Treatments: surgery: Anterior cervical decompression C5-6 and C6-C7 arthrodesis with structural allograft anterior plate fixation [id number]  Discharge Exam: Blood pressure 149/72, pulse 78, temperature 97.8 F (36.6 C), temperature source Oral, resp. rate 18, height 5\' 5"  (1.651 m), weight 74.617 kg (164 lb 8 oz), SpO2 97 %. Incision is clean and dry, motor function is intact in lower extremities and upper extremities.  Disposition:  discharge home  Discharge Instructions    Call MD for:  redness, tenderness, or signs of infection (pain, swelling, redness, odor or green/yellow discharge around incision site)    Complete by:  As directed      Call MD for:  severe uncontrolled pain    Complete by:  As directed      Call MD for:  temperature >100.4    Complete by:  As directed      Diet - low sodium heart healthy    Complete by:  As directed      Discharge instructions    Complete by:  As directed   Okay to shower. Do not apply salves or appointments to incision. No heavy lifting with the upper extremities greater than 15 pounds. May resume driving when not requiring pain medication and patient feels comfortable with doing so.     Increase activity slowly    Complete by:  As directed             Medication List    TAKE these medications        aspirin 81  MG tablet  Take 162 mg by mouth every morning.     cetirizine 10 MG tablet  Commonly known as:  ZYRTEC  Take 10 mg by mouth daily as needed for allergies.     diazepam 5 MG tablet  Commonly known as:  VALIUM  Take 1 tablet (5 mg total) by mouth every 6 (six) hours as needed for anxiety.     furosemide 20 MG tablet  Commonly known as:  LASIX  Take 1 tablet (20 mg total) by mouth daily.     metoprolol tartrate 25 MG tablet  Commonly known as:  LOPRESSOR  Take 0.5 tablets (12.5 mg total) by mouth 2 (two) times daily.     oxyCODONE-acetaminophen 5-325 MG tablet  Commonly known as:  PERCOCET/ROXICET  Take 1-2 tablets by mouth every 4 (four) hours as needed for moderate pain.     potassium chloride SA 20 MEQ tablet  Commonly known as:  K-DUR,KLOR-CON  Take 1 tablet (20 mEq total) by mouth daily.     simvastatin 40 MG tablet  Commonly known as:  ZOCOR  Take 1 tablet (40 mg total) by mouth daily.     SYSTANE OP  Place 1 drop into both eyes as needed (for dry eyes).     valsartan 80 MG tablet  Commonly known as:  DIOVAN  Take 80 mg by mouth daily.     valsartan 160 MG tablet  Commonly known as:  DIOVAN  Take 1 tablet (160 mg total) by mouth daily. Take as directed         Signed: Earleen Newport 06/09/2015, 9:01 AM

## 2015-06-09 NOTE — Progress Notes (Signed)
Utilization review completed.  

## 2015-06-09 NOTE — Evaluation (Signed)
Occupational Therapy Evaluation Patient Details Name: Megan Hull MRN: [id number] DOB: 10-Sep-1940 Today's Date: 06/09/2015    History of Present Illness Pt is a 74 y/o female who presents s/p C5-C7 ACDF on 06/08/15.   Clinical Impression   Patient evaluated by Occupational Therapy with no further acute OT needs identified. All education has been completed and the patient has no further questions. See below for any follow-up Occupational Therapy or equipment needs. OT to sign off. Thank you for referral.      Follow Up Recommendations  No OT follow up    Equipment Recommendations  None recommended by OT    Recommendations for Other Services       Precautions / Restrictions Precautions Precautions: Fall;Cervical Precaution Comments: handout present and pt able to verbal  Restrictions Weight Bearing Restrictions: No      Mobility Bed Mobility Overal bed mobility: Modified Independent             General bed mobility comments: in chair on arrival  Transfers Overall transfer level: Modified independent Equipment used: None             General transfer comment: No assist required. No LOB noted.     Balance Overall balance assessment: No apparent balance deficits (not formally assessed)                                          ADL Overall ADL's : At baseline                                       General ADL Comments: educated on adls with cervical precautions     Vision     Perception     Praxis      Pertinent Vitals/Pain Pain Assessment: No/denies pain     Hand Dominance Right   Extremity/Trunk Assessment Upper Extremity Assessment Upper Extremity Assessment: RUE deficits/detail RUE Deficits / Details: reports numbness and decr light pressure on first and second digit.pt with burn mark due to decr sensation PTA RUE Sensation: decreased light touch   Lower Extremity Assessment Lower Extremity  Assessment: Defer to PT evaluation   Cervical / Trunk Assessment Cervical / Trunk Assessment: Normal   Communication Communication Communication: No difficulties   Cognition Arousal/Alertness: Awake/alert Behavior During Therapy: WFL for tasks assessed/performed Overall Cognitive Status: Within Functional Limits for tasks assessed                     General Comments       Exercises       Shoulder Instructions      Home Living Family/patient expects to be discharged to:: Private residence Living Arrangements: Other relatives Available Help at Discharge: Family;Available 24 hours/day Type of Home: House Home Access: Level entry     Home Layout: Two level;Laundry or work area in basement;Able to live on main level with bedroom/bathroom Alternate Therapist, sports of Steps: Flight   Bathroom Shower/Tub: Teacher, early years/pre: Programmer, systems: Yes   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, contracting          Prior Functioning/Environment Level of Independence: Independent        Comments: Travels often, recently got back from [place]    OT Diagnosis: Generalized weakness;Acute pain   OT Problem List:  OT Treatment/Interventions:      OT Goals(Current goals can be found in the care plan section)    OT Frequency:     Barriers to D/C:            Co-evaluation              End of Session Nurse Communication: Mobility status;Precautions  Activity Tolerance: Patient tolerated treatment well Patient left: in chair;with call bell/phone within reach;with family/visitor present   Time: [id number] OT Time Calculation (min): 17 min Charges:  OT General Charges $OT Visit: 1 Procedure OT Evaluation $Initial OT Evaluation Tier I: 1 Procedure G-Codes:    Peri Maris Jul 09, 2015, 9:01 AM  Jeri Modena   OTR/L [phone] Office: [phone] .

## 2015-06-09 NOTE — Discharge Instructions (Signed)
Wound Care Leave incision open to air. You may shower. Do not scrub directly on incision.  Do not put any creams, lotions, or ointments on incision. Activity Walk each and every day, increasing distance each day. No lifting greater than 5 lbs.  Avoid excessive neck motion. No driving for 2 weeks; may ride as a passenger locally. Wear neck brace at all times except when showering.  If provided soft collar, may wear for comfort unless otherwise instructed. Diet Resume your normal diet.  Return to Work Will be discussed at you follow up appointment. Call Your Doctor If Any of These Occur Redness, drainage, or swelling at the wound.  Temperature greater than 101 degrees. Severe pain not relieved by pain medication. Increased difficulty swallowing. Incision starts to come apart. Follow Up Appt Call today for appointment in 4 weeks [phone]) or for problems.  If you have any hardware placed in your spine, you will need an x-ray before your appointment.  Anterior Cervical Diskectomy and Fusion Anterior cervical diskectomy and fusion is a surgery that is done on the neck (cervical spine) to take pressure off of the nerves or the spinal cord. It is performed through the front (anterior) part of the neck. During this surgery, the damaged disk that is causing pain, numbness, or weakness is removed. The area where the disk was removed is filled with a plastic spacer implant, a bone graft, or both. These implants and bone grafts take pressure off of the nerves and spinal cord by making more room for the nerves to leave the spine. LET [facility] CARE PROVIDER KNOW ABOUT:  Any allergies you have.  All medicines you are taking, including vitamins, herbs, eye drops, creams, and over-the-counter medicines.  Previous problems you or members of your family have had with the use of anesthetics.  Any blood disorders you have.  Previous surgeries you have had.  Any medical conditions you may  have. RISKS AND COMPLICATIONS Generally, this is a safe procedure. However, problems may occur, including:  Infection.  Bleeding with the possible need for blood transfusion.  Injury to surrounding structures, including nerves.  Leakage of fluid from the brain or spinal cord (cerebrospinal fluid).  Blood clots.  Temporary breathing difficulties after surgery. BEFORE THE PROCEDURE  Follow your health care provider's instructions about eating or drinking restrictions.  Ask your health care provider about:  Changing or stopping your regular medicines. This is especially important if you are taking diabetes medicines or blood thinners.  Taking medicines such as aspirin and ibuprofen. These medicines can thin your blood. Do not take these medicines before your procedure if your health care provider instructs you not to.  You may be given antibiotic medicines to help prevent infection.  Your incision site may be marked on your neck. PROCEDURE  An IV tube will be inserted into one of your veins.  You will be given one or more of the following:  A medicine that helps you relax (sedative).  A medicine that makes you fall asleep (general anesthetic).  A breathing tube will be placed.  Your neck will be cleaned with a germ-killing solution (antiseptic).  Your surgeon will make an incision on the front of your neck, usually within a skinfold line.  Your neck muscles will be spread apart, and the damaged disk and bone spurs will be removed.  The area where the disk was removed will be filled with a small plastic spacer implant, a bone graft, or both.  Your surgeon [phone]  put metal plates and screws (hardware) in your neck. This helps to stabilize the surgical site and keep implants and bone grafts in place. The hardware reduces motion at the surgical site so your bones can grow together (fuse). This provides extra support to your neck.  The incision will be closed with stitches  (sutures).  A bandage (dressing) will be applied to cover the incision. The procedure may vary among health care providers and hospitals. AFTER THE PROCEDURE  Your blood pressure, heart rate, breathing rate, and blood oxygen level will be monitored often until the medicines you were given have worn off.  You will be monitored for any signs of complications from the procedure, such as:  Too much bleeding from the incision site.  A buildup of blood under your skin at the surgical site.  Difficulty breathing.  You may continue to receive antibiotics.  You can start to eat as soon as you feel comfortable.  You may be given a neck brace to wear after surgery. This brace limits your neck movement while your bones are fusing together. Follow your health care provider's instructions about how often and how long you need to wear this.   This information is not intended to replace advice given to you by your health care provider. Make sure you discuss any questions you have with your health care provider.   Document Released: 05/17/2009 Document Revised: 06/19/2014 Document Reviewed: 05/17/2009 Elsevier Interactive Patient Education [employer].

## 2015-06-09 NOTE — Evaluation (Signed)
Physical Therapy Evaluation and Discharge Patient Details Name: Megan Hull MRN: [id number] DOB: March 13, 1941 Today's Date: 06/09/2015   History of Present Illness  Pt is a 74 y/o female who presents s/p C5-C7 ACDF on 06/08/15.  Clinical Impression  Patient evaluated by Physical Therapy with no further acute PT needs identified. All education has been completed and the patient has no further questions. At the time of PT eval pt was able to perform transfers and ambulation with modified independence. See below for any follow-up Physical Therapy or equipment needs. PT is signing off. Thank you for this referral.    Follow Up Recommendations No PT follow up;Supervision - Intermittent    Equipment Recommendations  None recommended by PT    Recommendations for Other Services       Precautions / Restrictions Precautions Precautions: Fall;Cervical Precaution Comments: Handout provided and reviewed with pt and family.  Restrictions Weight Bearing Restrictions: No      Mobility  Bed Mobility Overal bed mobility: Modified Independent             General bed mobility comments: HOB flat and rail down to simulate home environment.  Transfers Overall transfer level: Modified independent Equipment used: None             General transfer comment: No assist required. No LOB noted.   Ambulation/Gait Ambulation/Gait assistance: Modified independent (Device/Increase time) Ambulation Distance (Feet): 500 Feet Assistive device: None Gait Pattern/deviations: WFL(Within Functional Limits) Gait velocity: Decreased Gait velocity interpretation: Below normal speed for age/gender General Gait Details: Slow but steady gait. No LOB noted with normal walking. Pt exaggerated a turn at the end of the hall to be funny, and had a minor LOB. Pt was able to recover independently. VC's for general safety awareness were provided.   Stairs Stairs:  (Deferred as pt does not have stairs to  enter home. )          Wheelchair Mobility    Modified Rankin (Stroke Patients Only)       Balance Overall balance assessment: No apparent balance deficits (not formally assessed)                                           Pertinent Vitals/Pain Pain Assessment: No/denies pain (Endorses N/T in R hand)    Home Living Family/patient expects to be discharged to:: Private residence Living Arrangements: Other relatives Available Help at Discharge: Family;Available 24 hours/day Type of Home: House Home Access: Level entry     Home Layout: Two level;Laundry or work area in basement;Able to live on main level with bedroom/bathroom 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, contracting      Prior Function Level of Independence: Independent         Comments: Travels often, recently got back from [place]     Hand Dominance   Dominant Hand: Right    Extremity/Trunk Assessment   Upper Extremity Assessment: Defer to OT evaluation           Lower Extremity Assessment: Overall WFL for tasks assessed      Cervical / Trunk Assessment: Normal  Communication   Communication: No difficulties  Cognition Arousal/Alertness: Awake/alert Behavior During Therapy: WFL for tasks assessed/performed Overall Cognitive Status: Within Functional Limits for tasks assessed                      General Comments  Exercises        Assessment/Plan    PT Assessment Patent does not need any further PT services  PT Diagnosis Difficulty walking   PT Problem List    PT Treatment Interventions     PT Goals (Current goals can be found in the Care Plan section) Acute Rehab PT Goals PT Goal Formulation: All assessment and education complete, DC therapy    Frequency     Barriers to discharge        Co-evaluation               End of Session   Activity Tolerance: Patient tolerated treatment well Patient left: in chair;with call bell/phone within reach;with  family/visitor present Nurse Communication: Mobility status         Time: [id number] PT Time Calculation (min) (ACUTE ONLY): 23 min   Charges:   PT Evaluation $Initial PT Evaluation Tier I: 1 Procedure PT Treatments $Gait Training: 8-22 mins   PT G Codes:        Rolinda Roan Jul 07, 2015, 8:22 AM   Rolinda Roan, PT, DPT Acute Rehabilitation Services Pager: [phone]

## 2015-06-11 ENCOUNTER — Encounter (HOSPITAL_COMMUNITY): Payer: Self-pay | Admitting: Neurological Surgery

## 2015-06-23 ENCOUNTER — Encounter: Payer: Self-pay | Admitting: Gastroenterology

## 2015-07-01 DIAGNOSIS — M5412 Radiculopathy, cervical region: Secondary | ICD-10-CM | POA: Diagnosis not present

## 2015-07-01 DIAGNOSIS — N882 Stricture and stenosis of cervix uteri: Secondary | ICD-10-CM | POA: Diagnosis not present

## 2015-12-11 ENCOUNTER — Ambulatory Visit (HOSPITAL_COMMUNITY)
Admission: EM | Admit: 2015-12-11 | Discharge: 2015-12-11 | Disposition: A | Discharge status: Home / Self Care | Payer: Commercial Managed Care - HMO | Attending: Family Medicine | Admitting: Family Medicine

## 2015-12-11 ENCOUNTER — Encounter (HOSPITAL_COMMUNITY): Payer: Self-pay | Admitting: Emergency Medicine

## 2015-12-11 DIAGNOSIS — T783XXA Angioneurotic edema, initial encounter: Secondary | ICD-10-CM | POA: Diagnosis not present

## 2015-12-11 MED ORDER — EPINEPHRINE 0.3 MG/0.3ML IJ SOAJ: 0.3000 mg | Freq: Once | INTRAMUSCULAR | Status: DC | PRN

## 2015-12-11 MED ORDER — DIPHENHYDRAMINE HCL 25 MG PO CAPS
25.0000 mg | ORAL_CAPSULE | Freq: Once | ORAL | Status: AC
  Administered 2015-12-11: 25 mg via ORAL

## 2015-12-11 MED ORDER — DIPHENHYDRAMINE HCL 25 MG PO TABS: 50.0000 mg | ORAL_TABLET | Freq: Four times a day (QID) | ORAL | Status: DC | PRN

## 2015-12-11 MED ORDER — DIPHENHYDRAMINE HCL 25 MG PO CAPS
ORAL_CAPSULE | ORAL | Status: AC
  Filled 2015-12-11: qty 1

## 2015-12-11 NOTE — Discharge Instructions (Signed)
You should avoid further exposures to allergens as much as possible. Because of the possibility of anaphylaxis (a life threatening allergic reaction) I am prescribing epi-pens, which you should keep near you at all times. Your symptoms are improving, but if you notice ANY worsening of this at all, you must call 911 and seek emergent medical care. Take benadryl 50mg  in about 6 hours, and then as needed. This has been prescribed as well.   Angioedema Angioedema is a sudden swelling of tissues, often of the skin. It can occur on the face or genitals or in the abdomen or other body parts. The swelling usually develops over a short period and gets better in 24 to 48 hours. It often begins during the night and is found when the person wakes up. The person may also get red, itchy patches of skin (hives). Angioedema can be dangerous if it involves swelling of the air passages.  Depending on the cause, episodes of angioedema may only happen once, come back in unpredictable patterns, or repeat for several years and then gradually fade away.  CAUSES  Angioedema can be caused by an allergic reaction to various triggers. It can also result from nonallergic causes, including reactions to drugs, immune system disorders, viral infections, or an abnormal gene that is passed to you from your parents (hereditary). For some people with angioedema, the cause is unknown.  Some things that can trigger angioedema include:   Foods.   Medicines, such as ACE inhibitors, ARBs, nonsteroidal anti-inflammatory agents, or estrogen.   Latex.   Animal saliva.   Insect stings.   Dyes used in X-rays.   Mild injury.   Dental work.  Surgery.  Stress.   Sudden changes in temperature.   Exercise. SIGNS AND SYMPTOMS   Swelling of the skin.  Hives. If these are present, there is also intense itching.  Redness in the affected area.   Pain in the affected area.  Swollen lips or tongue.  Breathing problems.  This may happen if the air passages swell.  Wheezing. If internal organs are involved, there may be:   Nausea.   Abdominal pain.   Vomiting.   Difficulty swallowing.   Difficulty passing urine. DIAGNOSIS   Your health care provider will examine the affected area and take a medical and family history.  Various tests may be done to help determine the cause. Tests may include:  Allergy skin tests to see if the problem is an allergic reaction.   Blood tests to check for hereditary angioedema.   Tests to check for underlying diseases that could cause the condition.   A review of your medicines, including over-the-counter medicines, may be done. TREATMENT  Treatment will depend on the cause of the angioedema. Possible treatments include:   Removal of anything that triggered the condition (such as stopping certain medicines).   Medicines to treat symptoms or prevent attacks. Medicines given may include:   Antihistamines.   Epinephrine injection.   Steroids.   Hospitalization may be required for severe attacks. If the air passages are affected, it can be an emergency. Tubes may need to be placed to keep the airway open. HOME CARE INSTRUCTIONS   Take all medicines as directed by your health care provider.  If you were given medicines for emergency allergy treatment, always carry them with you.  Wear a medical bracelet as directed by your health care provider.   Avoid known triggers. SEEK MEDICAL CARE IF:   You have repeat attacks of angioedema.  Your attacks are more frequent or more severe despite preventive measures.   You have hereditary angioedema and are considering having children. It is important to discuss with your health care provider the risks of passing the condition on to your children. SEEK IMMEDIATE MEDICAL CARE IF:   You have severe swelling of the mouth, tongue, or lips.  You have difficulty breathing.   You have difficulty  swallowing.   You faint. MAKE SURE YOU:  Understand these instructions.  Will watch your condition.  Will get help right away if you are not doing well or get worse.   This information is not intended to replace advice given to you by your health care provider. Make sure you discuss any questions you have with your health care provider.   Document Released: 08/07/2001 Document Revised: 06/19/2014 Document Reviewed: 01/20/2013 Elsevier Interactive Patient Education [employer].

## 2015-12-11 NOTE — ED Notes (Signed)
Continues to feel that swelling and tightness are resolving.  Patient continues to breathe and swallow with no difficulty

## 2015-12-11 NOTE — ED Provider Notes (Signed)
CSN: [id number]     Arrival date & time 12/11/15  1757 History   None    Chief Complaint  Patient presents with  . Allergic Reaction   HPI Megan Hull is a 75 y.o. female with a history of multiple food allergies presenting for food allergy.   She reports eating trail mix which has red phosphates approximately 2 hours PTA and abrupt onset of facial (R > L) and tongue swelling approximately 1 hr PTA. She took zyrtec and 25mg  of benadryl po and notes swelling has improved. At no point has she had trouble breathing or swallowing (has drunk water). No vision changes, headache, dizziness, light-headedness, palpitations, chest pain, stridor, wheezing, dyspnea. Her last episode similar to this was 3 years ago when she ate something with paprika on it.   Past Medical History  Diagnosis Date  . Hypertension   . HLD (hyperlipidemia)   . Diverticulosis   . GERD (gastroesophageal reflux disease)   . Supraventricular arrhythmia   . Diarrhea   . Lower extremity edema   . Tobacco abuse   . Family history of premature CAD   . Status post dilation of esophageal narrowing   . Dysrhythmia     controlled with Metoprolol-12-19-13 LOV -Dr. Gwenlyn Found sees yearly  . Heart murmur     yrs ago  . Arthritis   . Diabetes mellitus without complication ([place])     borderline   Past Surgical History  Procedure Laterality Date  . Infected lymph node surgery  80's  . Colonoscopy    . 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 Adora Fridge)  . Transthoracic echocardiogram  12/2007    mild conc LVH  . Nm myocar perf wall motion  04/2011    bruce myoview; normal pattern of perfusion, EF 80%, low risk scan  . Tonsillectomy  70's  . Esophageal dilation  2014  . Anterior cervical decomp/discectomy fusion N/A 06/08/2015    Procedure: CERVICAL FIVE-SIX ,CERVICAL SIX-SEVEN ANTERIOR CERVICAL DECOMPRESSION/DISCECTOMY FUSION PLATING BONEGRAFT;  Surgeon: Kristeen Miss, MD;  Location: [facility];  Service: Neurosurgery;  Laterality: N/A;   Family History  Problem Relation Age of Onset  . Heart disease Brother     MI @ 18, HTN @ 32, DM @ 58   . Heart disease Father     MI  . Heart disease Mother     MI, CVA @ 94  . Heart disease Sister     CHF, CVA  . Esophageal cancer Brother   . Alzheimer's disease Sister   . Colon cancer Neg Hx   . Heart disease Brother     MI in 31s, lung cancer  . Heart disease Sister   . Hypertension Sister     x2  . Hyperlipidemia Sister     x2  . Diabetes Sister     x2  . Cancer Sister   . Hypertension Brother    Social History  Substance Use Topics  . Smoking status: Current Every Day Smoker -- 0.25 packs/day for 15 years    Types: Cigarettes  . Smokeless tobacco: Never Used  . Alcohol Use: 12.6 oz/week    7 Shots of liquor, 14 Standard drinks or equivalent per week     Comment: every night scotch   OB History    No data available     Review of Systems: As above  Allergies  Nexium; Codeine; Other; and Shellfish allergy  Home Medications  Prior to Admission medications   Medication Sig Start Date End Date Taking? Authorizing Provider  cetirizine (ZYRTEC) 10 MG tablet Take 10 mg by mouth daily as needed for allergies.   Yes Historical Provider, MD  diphenhydrAMINE (BENADRYL) 25 MG tablet Take 25 mg by mouth every 6 (six) hours as needed.   Yes Historical Provider, MD  aspirin 81 MG tablet Take 162 mg by mouth every morning.     Historical Provider, MD  diazepam (VALIUM) 5 MG tablet Take 1 tablet (5 mg total) by mouth every 6 (six) hours as needed for anxiety. 06/09/15   Kristeen Miss, MD  furosemide (LASIX) 20 MG tablet Take 1 tablet (20 mg total) by mouth daily. Patient taking differently: Take 10 mg by mouth daily.  12/18/14   Lorretta Harp, MD  metoprolol tartrate (LOPRESSOR) 25 MG tablet Take 0.5 tablets (12.5 mg total) by mouth 2 (two) times daily. 12/18/14   Lorretta Harp, MD  oxyCODONE-acetaminophen  (PERCOCET/ROXICET) 5-325 MG tablet Take 1-2 tablets by mouth every 4 (four) hours as needed for moderate pain. 06/09/15   Kristeen Miss, MD  Polyethyl Glycol-Propyl Glycol (SYSTANE OP) Place 1 drop into both eyes as needed (for dry eyes).    Historical Provider, MD  potassium chloride SA (K-DUR,KLOR-CON) 20 MEQ tablet Take 1 tablet (20 mEq total) by mouth daily. 12/18/14   Lorretta Harp, MD  simvastatin (ZOCOR) 40 MG tablet Take 1 tablet (40 mg total) by mouth daily. 12/18/14   Lorretta Harp, MD  valsartan (DIOVAN) 160 MG tablet Take 1 tablet (160 mg total) by mouth daily. Take as directed Patient not taking: Reported on 05/07/2015 12/18/14   Lorretta Harp, MD  valsartan (DIOVAN) 80 MG tablet Take 80 mg by mouth daily.    Historical Provider, MD   Meds Ordered and Administered this Visit  Medications - No data to display  BP 159/87 mmHg  Pulse 92  Temp(Src) 98.3 F (36.8 C) (Oral)  Resp 18  SpO2 97% No data found.  Physical Exam  Constitutional: She is oriented to person, place, and time. She appears well-developed and well-nourished. No distress.  HENT:  Head: Normocephalic.  Right Ear: External ear normal.  Left Ear: External ear normal.  Nose: Nose normal.  Mouth/Throat: Oropharynx is clear and moist.  Tongue and right facial edema noted, improved over serial exams x 1.5 hours. TMs retracted without effusion or inflammation bilaterally  Eyes: EOM are normal. Pupils are equal, round, and reactive to light. No scleral icterus.  Neck: Neck supple. No JVD present. No tracheal deviation present. No thyromegaly present.  Cardiovascular: Normal rate, regular rhythm and intact distal pulses.   Murmur (II/VI SEM over RUSB) heard. Pulmonary/Chest: Effort normal and breath sounds normal. No respiratory distress.  Abdominal: Soft. Bowel sounds are normal. She exhibits no distension. There is no tenderness.  Musculoskeletal: Normal range of motion. She exhibits no edema or tenderness.   Neurological: She is alert and oriented to person, place, and time. She exhibits normal muscle tone.  Skin: Skin is warm and dry. No rash noted. No pallor.  Vitals reviewed.  ED Course  Procedures (including critical care time)  Labs Review Labs Reviewed - No data to display  Imaging Review No results found.  MDM  No diagnosis found.  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 Improving steadily without airway compromise. Given another dose of benadryl here. Pt refuses steroids, but agrees to return for emergency care via EMS  if needed for delayed or biphasic reaction.  - Rx benadryl and stringent return precautions - Epi-pen prescribed - Avoid further exposures - Follow up with PCP, consider allergy referral  Patrecia Pour, MD 12/11/15 1905

## 2015-12-11 NOTE — ED Notes (Signed)
Patient ate trail mix.   Two to 3 hours later had oral swelling and tongue swelling, able to breathe and swallow.  No rash or hives to body.  Able to breathe and swallow, but obvious tongue swelling and right side of face feels tight

## 2015-12-22 ENCOUNTER — Other Ambulatory Visit: Payer: Self-pay | Admitting: Cardiovascular Disease

## 2015-12-31 ENCOUNTER — Encounter: Payer: Self-pay | Admitting: Cardiovascular Disease

## 2015-12-31 ENCOUNTER — Ambulatory Visit (INDEPENDENT_AMBULATORY_CARE_PROVIDER_SITE_OTHER): Payer: Commercial Managed Care - HMO | Admitting: Cardiovascular Disease

## 2015-12-31 VITALS — BP 138/88 | HR 78 | Ht 65.0 in | Wt 165.0 lb

## 2015-12-31 DIAGNOSIS — I10 Essential (primary) hypertension: Secondary | ICD-10-CM

## 2015-12-31 DIAGNOSIS — Z79899 Other long term (current) drug therapy: Secondary | ICD-10-CM

## 2015-12-31 DIAGNOSIS — R0600 Dyspnea, unspecified: Secondary | ICD-10-CM | POA: Diagnosis not present

## 2015-12-31 DIAGNOSIS — E785 Hyperlipidemia, unspecified: Secondary | ICD-10-CM | POA: Diagnosis not present

## 2015-12-31 DIAGNOSIS — I2583 Coronary atherosclerosis due to lipid rich plaque: Secondary | ICD-10-CM

## 2015-12-31 DIAGNOSIS — I2510 Atherosclerotic heart disease of native coronary artery without angina pectoris: Secondary | ICD-10-CM

## 2015-12-31 NOTE — Assessment & Plan Note (Signed)
History of hypertension or blood pressure measured at 138/88. She is on metoprolol and valsartan. Continue current meds at current dosing

## 2015-12-31 NOTE — Assessment & Plan Note (Signed)
History of lower extremity edema on diuretic therapy. He is aware of some restrictions. She says the edema gets worse throughout the day. I suspect this is related to venous insufficiency.

## 2015-12-31 NOTE — Patient Instructions (Signed)
Medication Instructions:  Your physician recommends that you continue on your current medications as directed. Please refer to the Current Medication list given to you today.   Labwork: Your physician recommends that you return for lab work AT [facility]. The lab can be found on the FIRST FLOOR of out building in [address]   Testing/Procedures: Your physician has requested that you have en exercise stress myoview. For further information please visit [email or web address]. Please follow instruction sheet, as given.    Follow-Up: Your physician wants you to follow-up in: [place]. You will receive a reminder letter in the mail two months in advance. If you don't receive a letter, please call our office to schedule the follow-up appointment.    Exercise Stress Echocardiogram An exercise stress echocardiogram is a heart (cardiac) test used to check the function of your heart. This test may also be called an exercise stress echocardiography or stress echo. This stress test will check how well your heart muscle and valves are working and determine if your heart muscle is getting enough blood. You will exercise on a treadmill to naturally increase or stress the functioning of your heart.  An echocardiogram uses sound waves (ultrasound) to produce an image of your heart. If your heart does not work normally, it may indicate coronary artery disease with poor coronary blood supply. The coronary arteries are the arteries that bring blood and oxygen to your heart. LET [facility] CARE PROVIDER KNOW ABOUT:  Any allergies you have.  All medicines you are taking, including vitamins, herbs, eye drops, creams, and over-the-counter medicines.  Previous problems you or members of your family have had with the use of anesthetics.  Any blood disorders you have.  Previous surgeries you have had.  Medical conditions you have.  Possibility of  pregnancy, if this applies. RISKS AND COMPLICATIONS Generally, this is a safe procedure. However, as with any procedure, complications can occur. Possible complications can include:  You develop pain or pressure in the following areas:  Chest.  Jaw or neck.  Between your shoulder blades.  Radiating down your left arm.  Dizziness or lightheadedness.  Shortness of breath.  Increased or irregular heartbeat.  Nausea or vomiting.  Heart attack (rare). BEFORE THE PROCEDURE  Avoid all forms of caffeine for 24 hours before your test or as directed by your health care provider. This includes coffee, tea (even decaffeinated tea), caffeinated sodas, chocolate, cocoa, and certain pain medicines.  Follow your health care provider's instructions regarding eating and drinking before the test.  Take your medicines as directed at regular times with water unless instructed otherwise. Exceptions may include:  If you have diabetes, ask how you are to take your insulin or pills. It is common to adjust insulin dosing the morning of the test.  If you are taking beta-blocker medicines, it is important to talk to your health care provider about these medicines well before the date of your test. Taking beta-blocker medicines may interfere with the test. In some cases, these medicines need to be changed or stopped 24 hours or more before the test.  If you wear a nitroglycerin patch, it may need to be removed prior to the test. Ask your health care provider if the patch should be removed before the test.  If you use an inhaler for any breathing condition, bring it with you to the test.  If you are an outpatient, bring a snack so you  can eat right after the stress phase of the test.  Do not smoke for 4 hours prior to the test or as directed by your health care provider.  Wear loose-fitting clothes and comfortable shoes for the test. This test involves walking on a treadmill. PROCEDURE   Multiple  electrodes will be put on your chest. If needed, small areas of your chest may be shaved to get better contact with the electrodes. Once the electrodes are attached to your body, multiple wires will be attached to the electrodes, and your heart rate will be monitored.  You will have an echocardiogram done at rest.  To produce this image of your heart, gel is applied to your chest, and a wand-like tool (transducer) is moved over the chest. The transducer sends the sound waves through the chest to create the moving images of your heart.  You may need an IV to receive a medication that improves the quality of the pictures.  You will then walk on a treadmill. The treadmill will be started at a slow pace. The treadmill speed and incline will gradually be increased to raise your heart rate.  At the peak of exercise, the treadmill will be stopped. You will lie down immediately on a bed so that a second echocardiogram can be done to visualize your heart's motion with exercise.  The test usually takes 30-60 minutes to complete. AFTER THE PROCEDURE  Your heart rate and blood pressure will be monitored after the test.  You may return to your normal schedule, including diet, activities, and medicines, unless your health care provider tells you otherwise.   This information is not intended to replace advice given to you by your health care provider. Make sure you discuss any questions you have with your health care provider.   Document Released: 06/02/2004 Document Revised: 06/03/2013 Document Reviewed: 02/03/2013 Elsevier Interactive Patient Education [employer].  If you need a refill on your cardiac medications before your next appointment, please call your pharmacy.

## 2015-12-31 NOTE — Progress Notes (Signed)
12/31/2015 Megan Hull   [birth year]  [id number]  Primary Physician Aretta Nip, MD Primary Cardiologist: Lorretta Harp MD [name], [place]  HPI:  The patient is a delightful, 75 year old, mildly overweight, single [name] with no children whom I last saw 12/18/14.[name] Her problems include hypertension, hyperlipidemia, continued tobacco abuse of one-third pack per day, and family history of premature heart disease. She had a negative stress test on December 24, 2007. She was admitted to the [facility] on January 15, 2011, with jaw pain, thought to be an anginal equivalent. I catheterized her, revealing noncritical CAD with a 60% mid LAD lesion. She had anterior takeoff RCA. She has been asymptomatic since. She had carotid Dopplers performed at Dr. Roland Earl office which apparently were unremarkable. A lipid profile performed in March revealed total cholesterol of 142, LDL of 76, and HDL of 47. Her major complaint is of lower extremity edema, which gets worse during the day. I had done venous reflux studies on her back in November which were normal.since I saw her last a year and a half ago she has noticed increasing lower extremity edema which is dependent as well as increasing dyspnea on exertion. She denies chest pain. Her weight is stable same. She does admit to dietary indiscretion with regard to salt. She empirically increased her diuretic therapy. This resulted in improvement in her edema. She recently came back from spending 5 weeks in [place] and is already planning her next trip to [place]. Given her increasing dyspnea on exertion, ongoing risk factors and known moderate CAD) to repeat a exercise Myoview stress test.   Current Outpatient Prescriptions  Medication Sig Dispense Refill  . aspirin 81 MG tablet Take 162 mg by mouth every morning.     . cetirizine (ZYRTEC) 10 MG tablet Take 10 mg by mouth daily as needed for allergies.    . diphenhydrAMINE  (BENADRYL) 25 MG tablet Take 2 tablets (50 mg total) by mouth every 6 (six) hours as needed for allergies. 30 tablet 0  . EPINEPHrine 0.3 mg/0.3 mL IJ SOAJ injection Inject 0.3 mLs (0.3 mg total) into the muscle once as needed (anaphylaxis). 2 Device 0  . furosemide (LASIX) 20 MG tablet Take 1 tablet (20 mg total) by mouth daily. (Patient taking differently: Take 10 mg by mouth daily. ) 90 tablet 3  . metoprolol tartrate (LOPRESSOR) 25 MG tablet TAKE 1/2 TABLET TWICE DAILY 90 tablet 3  . potassium chloride SA (K-DUR,KLOR-CON) 20 MEQ tablet TAKE 1 TABLET EVERY DAY 90 tablet 3  . simvastatin (ZOCOR) 40 MG tablet Take 1 tablet (40 mg total) by mouth daily. 90 tablet 3  . valsartan (DIOVAN) 160 MG tablet Take 1 tablet (160 mg total) by mouth daily. Take as directed 90 tablet 1  . valsartan (DIOVAN) 80 MG tablet Take 80 mg by mouth daily.     No current facility-administered medications for this visit.    Allergies  Allergen Reactions  . Nexium [Esomeprazole Magnesium] Other (See Comments)    Pt states she was unable to swallow for several hours after taking the nexium  . Codeine Other (See Comments)    REACTION: chest pain  . Other Hives and Other (See Comments)    Red Fish  . Shellfish Allergy Hives    Social History   Social History  . Marital Status: Single    Spouse Name: N/A  . Number of Children: 0  . Years of Education: N/A  Occupational History  . retired    Social History Main Topics  . Smoking status: Current Every Day Smoker -- 0.25 packs/day for 15 years    Types: Cigarettes  . Smokeless tobacco: Never Used  . Alcohol Use: 12.6 oz/week    7 Shots of liquor, 14 Standard drinks or equivalent per week     Comment: every night scotch  . Drug Use: No  . Sexual Activity: Not on file   Other Topics Concern  . Not on file   Social History Narrative     Review of Systems: General: negative for chills, fever, night sweats or weight changes.  Cardiovascular: negative  for chest pain, dyspnea on exertion, edema, orthopnea, palpitations, paroxysmal nocturnal dyspnea or shortness of breath Dermatological: negative for rash Respiratory: negative for cough or wheezing Urologic: negative for hematuria Abdominal: negative for nausea, vomiting, diarrhea, bright red blood per rectum, melena, or hematemesis Neurologic: negative for visual changes, syncope, or dizziness All other systems reviewed and are otherwise negative except as noted above.    Blood pressure 138/88, pulse 78, height 5\' 5"  (1.651 m), weight 165 lb (74.844 kg), SpO2 97 %.  General appearance: alert and no distress Neck: no adenopathy, no carotid bruit, no JVD, supple, symmetrical, trachea midline and thyroid not enlarged, symmetric, no tenderness/mass/nodules Lungs: clear to auscultation bilaterally Heart: regular rate and rhythm, S1, S2 normal, no murmur, click, rub or gallop Extremities: extremities normal, atraumatic, no cyanosis or edema  EKG normal sinus rhythm at 78 without ST or T-wave changes. Personally reviewed this EKG  ASSESSMENT AND PLAN:   Essential hypertension History of hypertension or blood pressure measured at 138/88. She is on metoprolol and valsartan. Continue current meds at current dosing  Hyperlipidemia History of hyperlipidemia on statin therapy. We will recheck a lipid and liver profile  Lower extremity edema History of lower extremity edema on diuretic therapy. He is aware of some restrictions. She says the edema gets worse throughout the day. I suspect this is related to venous insufficiency.  Coronary artery disease History of coronary artery disease status post cardiac catheterization performed by myself 01/15/11 revealing moderate noncritical CAD in her mid LAD. She did have an anterior takeoff RCA. She is noticing increasing dyspnea on exertion over the last year or so. We will recheck a exercise Myoview given her ongoing risk factors.      Lorretta Harp MD FACP,FACC,FAHA, [facility] 12/31/2015 9:58 AM

## 2015-12-31 NOTE — Assessment & Plan Note (Signed)
History of coronary artery disease status post cardiac catheterization performed by myself 01/15/11 revealing moderate noncritical CAD in her mid LAD. She did have an anterior takeoff RCA. She is noticing increasing dyspnea on exertion over the last year or so. We will recheck a exercise Myoview given her ongoing risk factors.

## 2015-12-31 NOTE — Assessment & Plan Note (Signed)
History of hyperlipidemia on statin therapy. We will recheck a lipid and liver profile 

## 2016-01-04 DIAGNOSIS — I10 Essential (primary) hypertension: Secondary | ICD-10-CM | POA: Diagnosis not present

## 2016-01-04 DIAGNOSIS — R0600 Dyspnea, unspecified: Secondary | ICD-10-CM | POA: Diagnosis not present

## 2016-01-04 DIAGNOSIS — Z79899 Other long term (current) drug therapy: Secondary | ICD-10-CM | POA: Diagnosis not present

## 2016-01-04 DIAGNOSIS — E785 Hyperlipidemia, unspecified: Secondary | ICD-10-CM | POA: Diagnosis not present

## 2016-01-05 ENCOUNTER — Encounter: Payer: Self-pay | Admitting: *Deleted

## 2016-01-05 LAB — LIPID PANEL
CHOL/HDL RATIO: 2.5 ratio (ref <=5.0)
Cholesterol: 129 mg/dL (ref 125–200)
HDL: 51 mg/dL (ref >=46)
LDL CALC: 57 mg/dL (ref <130)
Triglycerides: 104 mg/dL (ref <150)
VLDL: 21 mg/dL (ref <30)

## 2016-01-05 LAB — HEPATIC FUNCTION PANEL
ALK PHOS: 65 U/L (ref 33–130)
ALT: 12 U/L (ref 6–29)
AST: 16 U/L (ref 10–35)
Albumin: 3.8 g/dL (ref 3.6–5.1)
BILIRUBIN DIRECT: 0.1 mg/dL (ref <=0.2)
BILIRUBIN INDIRECT: 0.6 mg/dL (ref 0.2–1.2)
BILIRUBIN TOTAL: 0.7 mg/dL (ref 0.2–1.2)
Total Protein: 6.2 g/dL (ref 6.1–8.1)

## 2016-01-05 LAB — BASIC METABOLIC PANEL
BUN: 12 mg/dL (ref 7–25)
CHLORIDE: 109 mmol/L (ref 98–110)
CO2: 26 mmol/L (ref 20–31)
Calcium: 8.7 mg/dL (ref 8.6–10.4)
Creat: 0.94 mg/dL — ABNORMAL HIGH (ref 0.60–0.93)
Glucose, Bld: 106 mg/dL — ABNORMAL HIGH (ref 65–99)
POTASSIUM: 4.8 mmol/L (ref 3.5–5.3)
SODIUM: 142 mmol/L (ref 135–146)

## 2016-01-07 ENCOUNTER — Telehealth (HOSPITAL_COMMUNITY): Payer: Self-pay

## 2016-01-07 NOTE — Telephone Encounter (Signed)
Encounter complete. 

## 2016-01-12 ENCOUNTER — Ambulatory Visit (HOSPITAL_COMMUNITY)
Admission: RE | Admit: 2016-01-12 | Discharge: 2016-01-12 | Disposition: A | Discharge status: Home / Self Care | Payer: Commercial Managed Care - HMO | Source: Ambulatory Visit | Attending: Cardiovascular Disease | Admitting: Cardiovascular Disease

## 2016-01-12 ENCOUNTER — Telehealth (HOSPITAL_COMMUNITY): Payer: Self-pay

## 2016-01-12 DIAGNOSIS — R0600 Dyspnea, unspecified: Secondary | ICD-10-CM | POA: Diagnosis not present

## 2016-01-12 DIAGNOSIS — Z8249 Family history of ischemic heart disease and other diseases of the circulatory system: Secondary | ICD-10-CM | POA: Insufficient documentation

## 2016-01-12 DIAGNOSIS — E785 Hyperlipidemia, unspecified: Secondary | ICD-10-CM | POA: Diagnosis not present

## 2016-01-12 DIAGNOSIS — I10 Essential (primary) hypertension: Secondary | ICD-10-CM | POA: Diagnosis not present

## 2016-01-12 DIAGNOSIS — R002 Palpitations: Secondary | ICD-10-CM | POA: Insufficient documentation

## 2016-01-12 DIAGNOSIS — R0609 Other forms of dyspnea: Secondary | ICD-10-CM | POA: Diagnosis not present

## 2016-01-12 DIAGNOSIS — Z79899 Other long term (current) drug therapy: Secondary | ICD-10-CM | POA: Diagnosis not present

## 2016-01-12 DIAGNOSIS — R5383 Other fatigue: Secondary | ICD-10-CM | POA: Insufficient documentation

## 2016-01-12 DIAGNOSIS — Z720 Tobacco use: Secondary | ICD-10-CM | POA: Diagnosis not present

## 2016-01-12 LAB — MYOCARDIAL PERFUSION IMAGING
CHL CUP MPHR: 145 {beats}/min
CHL RATE OF PERCEIVED EXERTION: 17
CSEPED: 6 min
CSEPEDS: 0 s
CSEPEW: 7 METS
LV dias vol: 59 mL (ref 46–106)
LV sys vol: 14 mL
NUC STRESS TID: 1.04
Peak HR: 137 {beats}/min
Percent HR: 94 %
Rest HR: 57 {beats}/min
SDS: 1
SRS: 3
SSS: 4

## 2016-01-12 MED ORDER — TECHNETIUM TC 99M TETROFOSMIN IV KIT
29.9000 | PACK | Freq: Once | INTRAVENOUS | Status: AC | PRN
  Administered 2016-01-12: 29.9 via INTRAVENOUS
  Filled 2016-01-12: qty 30

## 2016-01-12 MED ORDER — AMINOPHYLLINE 25 MG/ML IV SOLN: 75.0000 mg | Freq: Once | INTRAVENOUS | Status: DC

## 2016-01-12 MED ORDER — TECHNETIUM TC 99M TETROFOSMIN IV KIT
10.3000 | PACK | Freq: Once | INTRAVENOUS | Status: AC | PRN
  Administered 2016-01-12: 10.3 via INTRAVENOUS
  Filled 2016-01-12: qty 10

## 2016-01-12 MED ORDER — REGADENOSON 0.4 MG/5ML IV SOLN
0.4000 mg | Freq: Once | INTRAVENOUS | Status: AC
  Administered 2016-01-12: 0.4 mg via INTRAVENOUS

## 2016-01-12 NOTE — Telephone Encounter (Signed)
I did inform pt that she should follow her normal daily instructions for her medication and that if she feels she is have a reaction that she is concerned about that her physician should see her and take a look at the pt. I advised the pt that if her swelling and itching are worrisome enough that if the need arises she can call 911 if needed and or go straight to the emergency room. I di advise pt to go ahead and call her physician for further instructions. Pt states that hand is swollen and itching but the pts IV and any tape used was on her forearm. Pt states she knows that the hand is not the place of the IV site but was concerned that something she was in contact here at the office could be the cause. The pt has been gone from the office three hours or more at this point. And I again told pt that for safety she should seek medical attention from her primary physician, the emergency room or call 911 if she feels it is necessary as I am not there with her and I can not give medical advice or prescribe or recommend medications for her.

## 2016-01-21 ENCOUNTER — Encounter (HOSPITAL_COMMUNITY): Payer: Self-pay | Admitting: Emergency Medicine

## 2016-01-21 ENCOUNTER — Inpatient Hospital Stay (HOSPITAL_COMMUNITY)
Admission: EM | Admit: 2016-01-21 | Discharge: 2016-01-29 | DRG: 915 | Disposition: A | Discharge status: Home / Self Care | Payer: Commercial Managed Care - HMO | Attending: Internal Medicine | Admitting: Internal Medicine

## 2016-01-21 ENCOUNTER — Emergency Department (HOSPITAL_COMMUNITY): Payer: Commercial Managed Care - HMO

## 2016-01-21 DIAGNOSIS — M5126 Other intervertebral disc displacement, lumbar region: Secondary | ICD-10-CM | POA: Diagnosis not present

## 2016-01-21 DIAGNOSIS — Z8709 Personal history of other diseases of the respiratory system: Secondary | ICD-10-CM | POA: Diagnosis present

## 2016-01-21 DIAGNOSIS — M1990 Unspecified osteoarthritis, unspecified site: Secondary | ICD-10-CM | POA: Diagnosis present

## 2016-01-21 DIAGNOSIS — M21372 Foot drop, left foot: Secondary | ICD-10-CM | POA: Diagnosis not present

## 2016-01-21 DIAGNOSIS — R1310 Dysphagia, unspecified: Secondary | ICD-10-CM | POA: Diagnosis not present

## 2016-01-21 DIAGNOSIS — T465X5A Adverse effect of other antihypertensive drugs, initial encounter: Secondary | ICD-10-CM | POA: Diagnosis present

## 2016-01-21 DIAGNOSIS — Z9102 Food additives allergy status: Secondary | ICD-10-CM

## 2016-01-21 DIAGNOSIS — Z7982 Long term (current) use of aspirin: Secondary | ICD-10-CM

## 2016-01-21 DIAGNOSIS — T887XXA Unspecified adverse effect of drug or medicament, initial encounter: Secondary | ICD-10-CM | POA: Diagnosis present

## 2016-01-21 DIAGNOSIS — Z79899 Other long term (current) drug therapy: Secondary | ICD-10-CM

## 2016-01-21 DIAGNOSIS — R531 Weakness: Secondary | ICD-10-CM

## 2016-01-21 DIAGNOSIS — I10 Essential (primary) hypertension: Secondary | ICD-10-CM | POA: Diagnosis not present

## 2016-01-21 DIAGNOSIS — F17210 Nicotine dependence, cigarettes, uncomplicated: Secondary | ICD-10-CM | POA: Diagnosis present

## 2016-01-21 DIAGNOSIS — G5702 Lesion of sciatic nerve, left lower limb: Secondary | ICD-10-CM | POA: Diagnosis not present

## 2016-01-21 DIAGNOSIS — M47816 Spondylosis without myelopathy or radiculopathy, lumbar region: Secondary | ICD-10-CM | POA: Diagnosis present

## 2016-01-21 DIAGNOSIS — E1165 Type 2 diabetes mellitus with hyperglycemia: Secondary | ICD-10-CM | POA: Diagnosis not present

## 2016-01-21 DIAGNOSIS — H538 Other visual disturbances: Secondary | ICD-10-CM | POA: Diagnosis not present

## 2016-01-21 DIAGNOSIS — E785 Hyperlipidemia, unspecified: Secondary | ICD-10-CM | POA: Diagnosis present

## 2016-01-21 DIAGNOSIS — J9690 Respiratory failure, unspecified, unspecified whether with hypoxia or hypercapnia: Secondary | ICD-10-CM | POA: Diagnosis not present

## 2016-01-21 DIAGNOSIS — Z91013 Allergy to seafood: Secondary | ICD-10-CM | POA: Diagnosis not present

## 2016-01-21 DIAGNOSIS — R609 Edema, unspecified: Secondary | ICD-10-CM | POA: Diagnosis present

## 2016-01-21 DIAGNOSIS — Z4682 Encounter for fitting and adjustment of non-vascular catheter: Secondary | ICD-10-CM | POA: Diagnosis not present

## 2016-01-21 DIAGNOSIS — T783XXS Angioneurotic edema, sequela: Secondary | ICD-10-CM | POA: Diagnosis not present

## 2016-01-21 DIAGNOSIS — M4726 Other spondylosis with radiculopathy, lumbar region: Secondary | ICD-10-CM | POA: Diagnosis not present

## 2016-01-21 DIAGNOSIS — I471 Supraventricular tachycardia: Secondary | ICD-10-CM | POA: Diagnosis present

## 2016-01-21 DIAGNOSIS — T783XXA Angioneurotic edema, initial encounter: Principal | ICD-10-CM | POA: Diagnosis present

## 2016-01-21 DIAGNOSIS — K219 Gastro-esophageal reflux disease without esophagitis: Secondary | ICD-10-CM | POA: Diagnosis present

## 2016-01-21 DIAGNOSIS — Z9109 Other allergy status, other than to drugs and biological substances: Secondary | ICD-10-CM

## 2016-01-21 DIAGNOSIS — R269 Unspecified abnormalities of gait and mobility: Secondary | ICD-10-CM | POA: Diagnosis not present

## 2016-01-21 DIAGNOSIS — R1313 Dysphagia, pharyngeal phase: Secondary | ICD-10-CM | POA: Diagnosis present

## 2016-01-21 DIAGNOSIS — H539 Unspecified visual disturbance: Secondary | ICD-10-CM | POA: Diagnosis not present

## 2016-01-21 DIAGNOSIS — M21379 Foot drop, unspecified foot: Secondary | ICD-10-CM | POA: Diagnosis not present

## 2016-01-21 DIAGNOSIS — J90 Pleural effusion, not elsewhere classified: Secondary | ICD-10-CM | POA: Diagnosis not present

## 2016-01-21 DIAGNOSIS — J9601 Acute respiratory failure with hypoxia: Secondary | ICD-10-CM | POA: Diagnosis not present

## 2016-01-21 DIAGNOSIS — Z87898 Personal history of other specified conditions: Secondary | ICD-10-CM | POA: Diagnosis present

## 2016-01-21 DIAGNOSIS — R29898 Other symptoms and signs involving the musculoskeletal system: Secondary | ICD-10-CM | POA: Diagnosis not present

## 2016-01-21 DIAGNOSIS — T783XXD Angioneurotic edema, subsequent encounter: Secondary | ICD-10-CM | POA: Diagnosis not present

## 2016-01-21 LAB — CBC WITH DIFFERENTIAL/PLATELET
BASOS PCT: 0 %
Basophils Absolute: 0 10*3/uL (ref 0.0–0.1)
EOS PCT: 4 %
Eosinophils Absolute: 0.2 10*3/uL (ref 0.0–0.7)
HCT: 21.6 % — ABNORMAL LOW (ref 36.0–46.0)
HEMOGLOBIN: 7 g/dL — AB (ref 12.0–15.0)
LYMPHS ABS: 2.2 10*3/uL (ref 0.7–4.0)
Lymphocytes Relative: 57 %
MCH: 30.6 pg (ref 26.0–34.0)
MCHC: 32.4 g/dL (ref 30.0–36.0)
MCV: 94.3 fL (ref 78.0–100.0)
MONOS PCT: 8 %
Monocytes Absolute: 0.3 10*3/uL (ref 0.1–1.0)
Neutro Abs: 1.2 10*3/uL — ABNORMAL LOW (ref 1.7–7.7)
Neutrophils Relative %: 31 %
PLATELETS: 99 10*3/uL — AB (ref 150–400)
RBC: 2.29 MIL/uL — AB (ref 3.87–5.11)
RDW: 13.6 % (ref 11.5–15.5)
WBC: 3.9 10*3/uL — AB (ref 4.0–10.5)

## 2016-01-21 LAB — I-STAT CG4 LACTIC ACID, ED: Lactic Acid, Venous: <0.3 mmol/L — ABNORMAL LOW (ref 0.5–1.9)

## 2016-01-21 MED ORDER — FUROSEMIDE 20 MG PO TABS
20.0000 mg | ORAL_TABLET | Freq: Every day | ORAL | Status: DC
  Administered 2016-01-22 – 2016-01-29 (×7): 20 mg via ORAL
  Filled 2016-01-21 (×7): qty 1

## 2016-01-21 MED ORDER — PROPOFOL 1000 MG/100ML IV EMUL
INTRAVENOUS | Status: AC
  Filled 2016-01-21: qty 100

## 2016-01-21 MED ORDER — PROPOFOL 10 MG/ML IV BOLUS
INTRAVENOUS | Status: AC | PRN
  Administered 2016-01-21: 2448 ug via INTRAVENOUS

## 2016-01-21 MED ORDER — PROPOFOL 1000 MG/100ML IV EMUL
5.0000 ug/kg/min | Freq: Once | INTRAVENOUS | Status: AC
  Administered 2016-01-21: 30 ug/kg/min via INTRAVENOUS

## 2016-01-21 MED ORDER — ETOMIDATE 2 MG/ML IV SOLN
INTRAVENOUS | Status: AC | PRN
Start: 2016-01-21 — End: 2016-01-21
  Administered 2016-01-21: 20 mg via INTRAVENOUS

## 2016-01-21 MED ORDER — FENTANYL CITRATE (PF) 100 MCG/2ML IJ SOLN
100.0000 ug | Freq: Once | INTRAMUSCULAR | Status: AC
Start: 2016-01-21 — End: 2016-01-21
  Administered 2016-01-21: 100 ug via INTRAVENOUS

## 2016-01-21 MED ORDER — FENTANYL CITRATE (PF) 100 MCG/2ML IJ SOLN
INTRAMUSCULAR | Status: AC
  Filled 2016-01-21: qty 2

## 2016-01-21 MED ORDER — DIPHENHYDRAMINE HCL 50 MG/ML IJ SOLN
25.0000 mg | Freq: Once | INTRAMUSCULAR | Status: AC
  Administered 2016-01-21: 25 mg via INTRAVENOUS

## 2016-01-21 MED ORDER — ENOXAPARIN SODIUM 40 MG/0.4ML ~~LOC~~ SOLN
40.0000 mg | SUBCUTANEOUS | Status: DC
  Administered 2016-01-22 – 2016-01-28 (×7): 40 mg via SUBCUTANEOUS
  Filled 2016-01-21 (×7): qty 0.4

## 2016-01-21 MED ORDER — SUCCINYLCHOLINE CHLORIDE 20 MG/ML IJ SOLN
INTRAMUSCULAR | Status: AC | PRN
  Administered 2016-01-21: 100 mg via INTRAVENOUS

## 2016-01-21 MED ORDER — SODIUM CHLORIDE 0.9 % IV SOLN: 250.0000 mL | INTRAVENOUS | Status: DC | PRN

## 2016-01-21 MED ORDER — MIDAZOLAM HCL 2 MG/2ML IJ SOLN
INTRAMUSCULAR | Status: AC
  Filled 2016-01-21: qty 2

## 2016-01-21 MED ORDER — METOPROLOL TARTRATE 12.5 MG HALF TABLET
12.5000 mg | ORAL_TABLET | Freq: Two times a day (BID) | ORAL | Status: DC
  Administered 2016-01-22 – 2016-01-23 (×4): 12.5 mg via ORAL
  Filled 2016-01-21 (×5): qty 1

## 2016-01-21 MED ORDER — FAMOTIDINE IN NACL 20-0.9 MG/50ML-% IV SOLN
20.0000 mg | Freq: Once | INTRAVENOUS | Status: AC
  Administered 2016-01-21: 20 mg via INTRAVENOUS

## 2016-01-21 MED ORDER — SIMVASTATIN 40 MG PO TABS
40.0000 mg | ORAL_TABLET | Freq: Every day | ORAL | Status: DC
  Administered 2016-01-22 – 2016-01-23 (×2): 40 mg via ORAL
  Filled 2016-01-21 (×3): qty 1

## 2016-01-21 MED ORDER — ASPIRIN 81 MG PO CHEW
162.0000 mg | CHEWABLE_TABLET | Freq: Every morning | ORAL | Status: DC
  Administered 2016-01-22 – 2016-01-29 (×7): 162 mg via ORAL
  Filled 2016-01-21 (×7): qty 2

## 2016-01-21 MED ORDER — LABETALOL HCL 5 MG/ML IV SOLN
10.0000 mg | INTRAVENOUS | Status: DC | PRN
  Administered 2016-01-21 – 2016-01-24 (×5): 10 mg via INTRAVENOUS
  Filled 2016-01-21 (×3): qty 4

## 2016-01-21 MED ORDER — METHYLPREDNISOLONE SODIUM SUCC 125 MG IJ SOLR
125.0000 mg | Freq: Four times a day (QID) | INTRAMUSCULAR | Status: DC
  Administered 2016-01-22 – 2016-01-24 (×10): 125 mg via INTRAVENOUS
  Filled 2016-01-21 (×11): qty 2

## 2016-01-21 MED ORDER — METHYLPREDNISOLONE SODIUM SUCC 125 MG IJ SOLR
125.0000 mg | Freq: Once | INTRAMUSCULAR | Status: AC
  Administered 2016-01-21: 125 mg via INTRAVENOUS

## 2016-01-21 MED ORDER — FAMOTIDINE IN NACL 20-0.9 MG/50ML-% IV SOLN
20.0000 mg | Freq: Two times a day (BID) | INTRAVENOUS | Status: DC
  Administered 2016-01-22 – 2016-01-24 (×6): 20 mg via INTRAVENOUS
  Filled 2016-01-21 (×6): qty 50

## 2016-01-21 MED ORDER — MIDAZOLAM HCL 5 MG/5ML IJ SOLN
INTRAMUSCULAR | Status: AC | PRN
Start: 2016-01-21 — End: 2016-01-21
  Administered 2016-01-21: 2 mg via INTRAVENOUS

## 2016-01-21 MED ORDER — FENTANYL CITRATE (PF) 2500 MCG/50ML IJ SOLN
25.0000 ug/h | INTRAMUSCULAR | Status: DC
  Administered 2016-01-21 – 2016-01-22 (×2): 50 ug/h via INTRAVENOUS
  Filled 2016-01-21 (×2): qty 50

## 2016-01-21 NOTE — H&P (Signed)
PULMONARY / CRITICAL CARE MEDICINE   Name: Megan Hull MRN: [id number] DOB: 12/24/1940    ADMISSION DATE:  01/21/2016   CHIEF COMPLAINT:  Tongue swelling  HISTORY OF PRESENT ILLNESS:   [name] is a 75 y/o woman who presented to the ED with swollen tongue. She had prior episodes of similar presentations, but none this severe. Her family (and she) report some concerns with allergies to various things, such as paprika, fish, and possibly some tree branches. On the evening of admission, she had gone to [place]. Later, she started experiencing tongue swelling. She took benadryl, but her tongue continued to swell. She did not have any rash or flushing. She had previously had a similar incident, but without clear exposure, for which she went to an urgent care center and was treated for benadryl only. In the ED, her tongue continued to swell and she was intubated for airway protection. She does endorse that her sister had similar experiences.   PAST MEDICAL HISTORY :  She  has a past medical history of Arthritis; Diabetes mellitus without complication ([place]); Diarrhea; Diverticulosis; Dysrhythmia; Family history of premature CAD; GERD (gastroesophageal reflux disease); Heart murmur; HLD (hyperlipidemia); Hypertension; Lower extremity edema; Status post dilation of esophageal narrowing; Supraventricular arrhythmia; and Tobacco abuse.  PAST SURGICAL HISTORY: She  has a past surgical history that includes infected lymph node surgery (80's); Colonoscopy; Cardiac catheterization (01/15/2009); transthoracic echocardiogram (12/2007); NM MYOCAR PERF WALL MOTION (04/2011); Tonsillectomy (70's); Esophageal dilation (2014); and Anterior cervical decomp/discectomy fusion (N/A, 06/08/2015).  Allergies  Allergen Reactions  . Nexium [Esomeprazole Magnesium] Anaphylaxis    Pt states she was unable to swallow for several hours after taking the nexium  . Other Hives, Shortness Of Breath and Other (See  Comments)    Red Fish (Hives) Bojangle's chicken (swelling and shortness of breath) PAPRIKA  . Codeine Other (See Comments)    REACTION: chest pain  . Shellfish Allergy Hives  . Tape Rash    Coban wrap turned her arm red (after stress test)    No current facility-administered medications on file prior to encounter.    Current Outpatient Prescriptions on File Prior to Encounter  Medication Sig  . aspirin 81 MG tablet Take 162 mg by mouth every morning.   . cetirizine (ZYRTEC) 10 MG tablet Take 10 mg by mouth daily as needed for allergies.  . diphenhydrAMINE (BENADRYL) 25 MG tablet Take 2 tablets (50 mg total) by mouth every 6 (six) hours as needed for allergies. (Patient taking differently: Take 50 mg by mouth daily as needed (for allergic reactions). )  . furosemide (LASIX) 20 MG tablet Take 1 tablet (20 mg total) by mouth daily. (Patient taking differently: Take 10 mg by mouth daily. )  . metoprolol tartrate (LOPRESSOR) 25 MG tablet TAKE 1/2 TABLET TWICE DAILY  . potassium chloride SA (K-DUR,KLOR-CON) 20 MEQ tablet TAKE 1 TABLET EVERY DAY  . simvastatin (ZOCOR) 40 MG tablet Take 1 tablet (40 mg total) by mouth daily.  . valsartan (DIOVAN) 80 MG tablet Take 80 mg by mouth daily.  [name] EPINEPHrine 0.3 mg/0.3 mL IJ SOAJ injection Inject 0.3 mLs (0.3 mg total) into the muscle once as needed (anaphylaxis).  . valsartan (DIOVAN) 160 MG tablet Take 1 tablet (160 mg total) by mouth daily. Take as directed (Patient not taking: Reported on 01/21/2016)    FAMILY HISTORY:  Her indicated that her mother is deceased. She indicated that her father is deceased. She indicated that two of her ten sisters  are alive. She indicated that only one of her six brothers is alive. She indicated that the status of her neg hx is unknown.    SOCIAL HISTORY: She  reports that she has been smoking Cigarettes.  She has a 3.75 pack-year smoking history. She has never used smokeless tobacco. She reports that she drinks  about 12.6 oz of alcohol per week . She reports that she does not use drugs.  SUBJECTIVE:  75 y/o woman with tongue swelling.  VITAL SIGNS: BP (!) 188/106   Pulse 83   Resp 16   Ht 5\' 4"  (1.626 m)   Wt 180 lb (81.6 kg)   SpO2 100%   BMI 30.90 kg/m   HEMODYNAMICS:    VENTILATOR SETTINGS: Vent Mode: PRVC FiO2 (%):  [50 %] 50 % Set Rate:  [16 bmp] 16 bmp Vt Set:  [440 mL] 440 mL PEEP:  [65 cmH20] 65 cmH20 Plateau Pressure:  [19 cmH20] 19 cmH20  INTAKE / OUTPUT: No intake/output data recorded.  PHYSICAL EXAMINATION: General:  Intubated and sedated Neuro:  Sedated on propfol and fentanyl HEENT:  Grossly swollen tongue protruding from mouth. No lip involvement. Cardiovascular:  Normal heart sounds Lungs:  Clear lungs Abdomen:  Soft, normal BS Musculoskeletal:  Normal joints Skin:  No visible rashes  LABS:  BMET No results for input(s): NA, K, CL, CO2, BUN, CREATININE, GLUCOSE in the last 168 hours.  Electrolytes No results for input(s): CALCIUM, MG, PHOS in the last 168 hours.  CBC  Recent Labs Lab 01/21/16 2158  WBC 3.9*  HGB 7.0*  HCT 21.6*  PLT 99*    Coag's No results for input(s): APTT, INR in the last 168 hours.  Sepsis Markers  Recent Labs Lab 01/21/16 2218  LATICACIDVEN <0.30*    ABG No results for input(s): PHART, PCO2ART, PO2ART in the last 168 hours.  Liver Enzymes No results for input(s): AST, ALT, ALKPHOS, BILITOT, ALBUMIN in the last 168 hours.  Cardiac Enzymes No results for input(s): TROPONINI, PROBNP in the last 168 hours.  Glucose No results for input(s): GLUCAP in the last 168 hours.  Imaging Dg Chest Portable 1 View  Result Date: 01/21/2016 CLINICAL DATA:  Endotracheal tube placement. EXAM: PORTABLE CHEST 1 VIEW COMPARISON:  Chest radiograph performed 01/14/2009 FINDINGS: The patient's endotracheal tube is seen ending 1 cm above the carina. This could be retracted 1-2 cm. An enteric tube is seen extending below the  diaphragm. Mild vascular congestion is noted. No pleural effusion or pneumothorax is seen. The cardiomediastinal silhouette is normal in size. No acute osseous abnormalities are identified. IMPRESSION: 1. Endotracheal tube seen ending 1 cm above the carina. This could be retracted 1-2 cm. 2. Mild vascular congestion noted.  Lungs remain grossly clear. Electronically Signed   By: Garald Balding M.D.   On: 01/21/2016 22:19     STUDIES:  (none pertinent)  CULTURES: None  ANTIBIOTICS: None  SIGNIFICANT EVENTS: Intubated in ED for airway protection  LINES/TUBES: ETT 7.0 8/11 PIV x2  DISCUSSION: 75 y/o woman presenting with angioedema  ASSESSMENT / PLAN:  PULMONARY A: Angioedema, possibly hereditary vs ARB related  Need for mechanical ventilation. Threatened airway P:   ED reports no edema of vocal cords, swelling involving tongue only. Treat with solumedrol 125 q6, famotidine. Maintain on PRVC overnight SBT in AM  CARDIOVASCULAR A:  Hypertension P:  Labetalol PRN, restart home meds except for ARB  RENAL A:   Normal renal function P:    GASTROINTESTINAL A:  NPO P:   Anticipate extubation within 24-48 hrs  HEMATOLOGIC A:   No active issues P:   INFECTIOUS A:   No active concerns for infection P:    ENDOCRINE A:   No active issues   P:    NEUROLOGIC A:   Need for sedation P:   Propofol RASS goal: 0   FAMILY  - Updates: Updated on admission  - Inter-disciplinary family meet or Palliative Care meeting due by:  8/18  CRITICAL CARE Performed by: Luz Brazen   Total critical care time: 45 minutes  Critical care time was exclusive of separately billable procedures and treating other patients.  Critical care was necessary to treat or prevent imminent or life-threatening deterioration.  Critical care was time spent personally by me on the following activities: development of treatment plan with patient and/or surrogate as well as nursing,  discussions with consultants, evaluation of patient's response to treatment, examination of patient, obtaining history from patient or surrogate, ordering and performing treatments and interventions, ordering and review of laboratory studies, ordering and review of radiographic studies, pulse oximetry and re-evaluation of patient's condition.  Luz Brazen, MD Pulmonary and [place] Pager: [phone]  01/21/2016, 11:22 PM

## 2016-01-21 NOTE — ED Provider Notes (Signed)
The patient is a 75 year old female, she takes an angiotensin receptor blocker as well as several other medications chronically, reports 2 months ago she had a small amount of tongue swelling which resolved after Benadryl, today with 30 minutes prior to arrival she had severe onset of acute swelling of the tongue which has filled her entire mouth. She is unable to speak, she speaks in garbled words that are barely decipherable. On exam the patient has severe angioedema of the tongue, her lips look normal, you cannot see anything but the front of her hard palate, she is breathing fine through her nose her oxygen is 100% and she is not tachycardic. Due to the severe swelling of the tongue it was decided to electively intubate the patient as this was rapid in onset and severe. Thankfully she was intubated on the first attempt with kaleidoscope laryngoscopy by the resident, please see has associated no. She will need propofol sedation, medications for allergic reaction though it may not help with angioedema and placement in the ICU overnight. The patient is critically ill.  Medications  propofol (DIPRIVAN) 1000 MG/100ML infusion (not administered)  midazolam (VERSED) 2 MG/2ML injection (not administered)  etomidate (AMIDATE) injection (20 mg Intravenous Given 01/21/16 2154)  succinylcholine (ANECTINE) injection (100 mg Intravenous Given 01/21/16 2154)  midazolam (VERSED) 5 MG/5ML injection (2 mg Intravenous Given 01/21/16 2158)  propofol (DIPRIVAN) 10 mg/mL bolus/IV push (2,448 mcg Intravenous Given 01/21/16 2159)   CRITICAL CARE Performed by: Johnna Acosta Total critical care time: 35 minutes Critical care time was exclusive of separately billable procedures and treating other patients. Critical care was necessary to treat or prevent imminent or life-threatening deterioration. Critical care was time spent personally by me on the following activities: development of treatment plan with patient and/or  surrogate as well as nursing, discussions with consultants, evaluation of patient's response to treatment, examination of patient, obtaining history from patient or surrogate, ordering and performing treatments and interventions, ordering and review of laboratory studies, ordering and review of radiographic studies, pulse oximetry and re-evaluation of patient's condition.  I saw and evaluated the patient, reviewed the resident's note and I agree with the findings and plan.  I was personally present and directly supervised the following procedures:  Intubation  Final diagnoses:  Angioedema, initial encounter      Noemi Chapel, MD 01/22/16 1006

## 2016-01-21 NOTE — Code Documentation (Signed)
MDs attempting intubation

## 2016-01-21 NOTE — ED Notes (Signed)
MD Supples informed of current propofol infusion rate and its ineffectiveness.

## 2016-01-21 NOTE — Progress Notes (Signed)
RT NOTE:  RT pulled ETT back to 20cm @ [place]. 24 @ Lip following Radiology request.

## 2016-01-21 NOTE — Progress Notes (Signed)
RT  NOTE:   RT assisted with intubation of patient due to oral swelling. RT monitoring

## 2016-01-21 NOTE — ED Provider Notes (Signed)
[facility] DEPT Provider Note   CSN: [id number] Arrival date & time: 01/21/16  2142  First Provider Contact:  None       History   Chief Complaint Chief Complaint  Patient presents with  . Allergic Reaction    HPI Megan Hull is a 75 y.o. female.  HPI  Patient presented for an allergic reaction. She reports pressing one hour of tongue swelling before arrival. She denies double debridement this time. Denies itching, vomiting. She is allergic to radiology L Fish, doesn't know if she is, these. She did eat chicken recently. She also takes sartan.  Past Medical History:  Diagnosis Date  . Arthritis   . Diabetes mellitus without complication (HCC)    borderline  . Diarrhea   . Diverticulosis   . Dysrhythmia    controlled with Metoprolol-12-19-13 LOV -Dr. Gwenlyn Found sees yearly  . Family history of premature CAD   . GERD (gastroesophageal reflux disease)   . Heart murmur    yrs ago  . HLD (hyperlipidemia)   . Hypertension   . Lower extremity edema   . Status post dilation of esophageal narrowing   . Supraventricular arrhythmia   . Tobacco abuse     Patient Active Problem List   Diagnosis Date Noted  . Cervical spondylosis with myelopathy and radiculopathy 06/08/2015  . Coronary artery disease 12/18/2014  . Esophageal stricture 04/23/2014  . Lower extremity numbness 08/11/2013  . Essential hypertension 05/15/2013  . Hyperlipidemia 05/15/2013  . Dyspnea on exertion 05/15/2013  . Lower extremity edema 05/15/2013  . Diarrhea 02/12/2013  . Dyspepsia and other specified disorders of function of stomach 09/23/2012  . Esophageal reflux 01/20/2010  . DYSPHAGIA UNSPECIFIED 01/20/2010    Past Surgical History:  Procedure Laterality Date  . ANTERIOR CERVICAL DECOMP/DISCECTOMY FUSION N/A 06/08/2015   Procedure: CERVICAL FIVE-SIX ,CERVICAL SIX-SEVEN ANTERIOR CERVICAL DECOMPRESSION/DISCECTOMY FUSION PLATING BONEGRAFT;  Surgeon: Kristeen Miss, MD;  Location: [facility];  Service: Neurosurgery;  Laterality: N/A;  . 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 Adora Fridge)  . COLONOSCOPY    . ESOPHAGEAL DILATION  2014  . infected lymph node surgery  80's  . NM MYOCAR PERF WALL MOTION  04/2011   bruce myoview; normal pattern of perfusion, EF 80%, low risk scan  . TONSILLECTOMY  70's  . TRANSTHORACIC ECHOCARDIOGRAM  12/2007   mild conc LVH    OB History    No data available       Home Medications    Prior to Admission medications   Medication Sig Start Date End Date Taking? Authorizing Provider  aspirin 81 MG tablet Take 162 mg by mouth every morning.     Historical Provider, MD  cetirizine (ZYRTEC) 10 MG tablet Take 10 mg by mouth daily as needed for allergies.    Historical Provider, MD  diphenhydrAMINE (BENADRYL) 25 MG tablet Take 2 tablets (50 mg total) by mouth every 6 (six) hours as needed for allergies. 12/11/15   Patrecia Pour, MD  EPINEPHrine 0.3 mg/0.3 mL IJ SOAJ injection Inject 0.3 mLs (0.3 mg total) into the muscle once as needed (anaphylaxis). 12/11/15   Patrecia Pour, MD  furosemide (LASIX) 20 MG tablet Take 1 tablet (20 mg total) by mouth daily. Patient taking differently: Take 10 mg by mouth daily.  12/18/14   Lorretta Harp, MD  metoprolol tartrate (LOPRESSOR) 25 MG tablet TAKE 1/2 TABLET TWICE DAILY 12/22/15   Lorretta Harp, MD  potassium chloride SA (K-DUR,KLOR-CON) 20 MEQ tablet TAKE 1 TABLET EVERY DAY 12/22/15   Lorretta Harp, MD  simvastatin (ZOCOR) 40 MG tablet Take 1 tablet (40 mg total) by mouth daily. 12/18/14   Lorretta Harp, MD  valsartan (DIOVAN) 160 MG tablet Take 1 tablet (160 mg total) by mouth daily. Take as directed 12/18/14   Lorretta Harp, MD  valsartan (DIOVAN) 80 MG tablet Take 80 mg by mouth daily.    Historical Provider, MD    Family History Family History  Problem Relation Age of Onset  . Heart disease Brother     MI @ 71, HTN @ 52, DM @ 23   . Heart disease Father      MI  . Heart disease Mother     MI, CVA @ 98  . Heart disease Sister     CHF, CVA  . Esophageal cancer Brother   . Alzheimer's disease Sister   . Colon cancer Neg Hx   . Heart disease Brother     MI in 73s, lung cancer  . Heart disease Sister   . Hypertension Sister     x2  . Hyperlipidemia Sister     x2  . Diabetes Sister     x2  . Cancer Sister   . Hypertension Brother     Social History Social History  Substance Use Topics  . Smoking status: Current Every Day Smoker    Packs/day: 0.25    Years: 15.00    Types: Cigarettes  . Smokeless tobacco: Never Used  . Alcohol use 12.6 oz/week    7 Shots of liquor, 14 Standard drinks or equivalent per week     Comment: every night scotch     Allergies   Nexium [esomeprazole magnesium]; Other; Codeine; and Shellfish allergy   Review of Systems Review of Systems  Constitutional: Negative for chills and fever.  HENT: Positive for facial swelling. Negative for ear pain and sore throat.   Eyes: Negative for pain and visual disturbance.  Respiratory: Negative for cough and shortness of breath.   Cardiovascular: Negative for chest pain and palpitations.  Gastrointestinal: Negative for abdominal pain and vomiting.  Genitourinary: Negative for dysuria and hematuria.  Musculoskeletal: Negative for arthralgias and back pain.  Skin: Negative for color change and rash.  Neurological: Negative for seizures and syncope.  All other systems reviewed and are negative.    Physical Exam Updated Vital Signs Wt 81.6 kg   BMI 29.95 kg/m   Physical Exam  Constitutional: She appears well-developed and well-nourished. She appears distressed.  HENT:  Head: Normocephalic and atraumatic.  Very enlarged tongue with change in voice  Eyes: Conjunctivae are normal.  Neck: Neck supple.  Cardiovascular: Normal rate and regular rhythm.   No murmur heard. Pulmonary/Chest: Breath sounds normal. She is in respiratory distress.  Abdominal: Soft.  There is no tenderness.  Musculoskeletal: She exhibits no edema.  Neurological: She is alert.  Skin: Skin is warm and dry.  Psychiatric: She has a normal mood and affect.  Nursing note and vitals reviewed.    ED Treatments / Results  Labs (all labs ordered are listed, but only abnormal results are displayed) Labs Reviewed - No data to display  EKG  EKG Interpretation None       Radiology No results found.  Procedures .Intubation Date/Time: 01/21/2016 10:06 PM Performed by: Levada Schilling Authorized by: Levada Schilling   Consent:    Consent obtained:  Emergent situation and verbal  Consent given by:  Patient Pre-procedure details:    Patient status:  Awake   Mallampati score:  IV   Pretreatment medications:  None   Paralytics:  Succinylcholine Procedure details:    Preoxygenation:  Nonrebreather mask   Intubation method:  Oral   Oral intubation technique:  Video-assisted   Tube size (mm):  7.0   Tube type:  Cuffed   Number of attempts:  1   Tube visualized through cords: yes   Placement assessment:    ETT to lip:  23   Tube secured with:  ETT holder   Breath sounds:  Equal   Placement verification: chest rise, condensation, CXR verification, direct visualization, equal breath sounds and ETCO2 detector     CXR findings:  ETT in proper place Post-procedure details:    Patient tolerance of procedure:  Tolerated well, no immediate complications   (including critical care time)  Medications Ordered in ED Medications  etomidate (AMIDATE) injection (20 mg Intravenous Given 01/21/16 2154)  succinylcholine (ANECTINE) injection (100 mg Intravenous Given 01/21/16 2154)  midazolam (VERSED) 5 MG/5ML injection (2 mg Intravenous Given 01/21/16 2158)  propofol (DIPRIVAN) 10 mg/mL bolus/IV push (2,448 mcg Intravenous Given 01/21/16 2159)  propofol (DIPRIVAN) 1000 MG/100ML infusion (not administered)  midazolam (VERSED) 2 MG/2ML injection (not administered)     Initial  Impression / Assessment and Plan / ED Course  I have reviewed the triage vital signs and the nursing notes.  Pertinent labs & imaging results that were available during my care of the patient were reviewed by me and considered in my medical decision making (see chart for details).  Clinical Course   Patient presented and was notified to have an airway emergency. She had significant swelling of her tongue with impending airway compromise, and therefore required intubation on a emergent basis, as above. patient not identified as anaphylactic, she had no other symptoms.  Steroids, antihistamines and fluids ordered.  Patient sedated.  Labs showed pancytopenia, possibly dilutional, so redraw ordered.  No symptoms to explain that  Critical care consulted and admitted this patient.   Final Clinical Impressions(s) / ED Diagnoses   Final diagnoses:  None    New Prescriptions New Prescriptions   No medications on file     Levada Schilling, MD 01/21/16 2358    Levada Schilling, MD 01/21/16 2358    Noemi Chapel, MD 01/22/16 1010

## 2016-01-22 DIAGNOSIS — T783XXD Angioneurotic edema, subsequent encounter: Secondary | ICD-10-CM

## 2016-01-22 DIAGNOSIS — J9601 Acute respiratory failure with hypoxia: Secondary | ICD-10-CM | POA: Diagnosis present

## 2016-01-22 LAB — MRSA PCR SCREENING: MRSA by PCR: NEGATIVE

## 2016-01-22 LAB — CBC WITH DIFFERENTIAL/PLATELET
BASOS PCT: 0 %
Basophils Absolute: 0 10*3/uL (ref 0.0–0.1)
EOS ABS: 0.2 10*3/uL (ref 0.0–0.7)
EOS PCT: 3 %
HCT: 40.4 % (ref 36.0–46.0)
HEMOGLOBIN: 13.2 g/dL (ref 12.0–15.0)
Lymphocytes Relative: 26 %
Lymphs Abs: 1.7 10*3/uL (ref 0.7–4.0)
MCH: 30.3 pg (ref 26.0–34.0)
MCHC: 32.7 g/dL (ref 30.0–36.0)
MCV: 92.9 fL (ref 78.0–100.0)
MONOS PCT: 5 %
Monocytes Absolute: 0.3 10*3/uL (ref 0.1–1.0)
NEUTROS PCT: 67 %
Neutro Abs: 4.5 10*3/uL (ref 1.7–7.7)
PLATELETS: 198 10*3/uL (ref 150–400)
RBC: 4.35 MIL/uL (ref 3.87–5.11)
RDW: 13.6 % (ref 11.5–15.5)
WBC: 6.8 10*3/uL (ref 4.0–10.5)

## 2016-01-22 LAB — COMPREHENSIVE METABOLIC PANEL
ALK PHOS: 61 U/L (ref 38–126)
ALT: 16 U/L (ref 14–54)
AST: 20 U/L (ref 15–41)
Albumin: 3.4 g/dL — ABNORMAL LOW (ref 3.5–5.0)
Anion gap: 8 (ref 5–15)
BILIRUBIN TOTAL: 0.8 mg/dL (ref 0.3–1.2)
BUN: 15 mg/dL (ref 6–20)
CALCIUM: 8.2 mg/dL — AB (ref 8.9–10.3)
CHLORIDE: 107 mmol/L (ref 101–111)
CO2: 24 mmol/L (ref 22–32)
Creatinine, Ser: 0.93 mg/dL (ref 0.44–1.00)
GFR, EST NON AFRICAN AMERICAN: 59 mL/min — AB (ref >60)
Glucose, Bld: 124 mg/dL — ABNORMAL HIGH (ref 65–99)
Potassium: 3.7 mmol/L (ref 3.5–5.1)
Sodium: 139 mmol/L (ref 135–145)
TOTAL PROTEIN: 6.1 g/dL — AB (ref 6.5–8.1)

## 2016-01-22 LAB — GLUCOSE, CAPILLARY
GLUCOSE-CAPILLARY: 165 mg/dL — AB (ref 65–99)
Glucose-Capillary: 167 mg/dL — ABNORMAL HIGH (ref 65–99)

## 2016-01-22 LAB — PHOSPHORUS: Phosphorus: 2.9 mg/dL (ref 2.5–4.6)

## 2016-01-22 LAB — MAGNESIUM: MAGNESIUM: 1.6 mg/dL — AB (ref 1.7–2.4)

## 2016-01-22 MED ORDER — POTASSIUM CHLORIDE 20 MEQ/15ML (10%) PO SOLN
40.0000 meq | Freq: Once | ORAL | Status: AC
  Administered 2016-01-22: 40 meq
  Filled 2016-01-22: qty 30

## 2016-01-22 MED ORDER — PROPOFOL 1000 MG/100ML IV EMUL
5.0000 ug/kg/min | Freq: Once | INTRAVENOUS | Status: AC
  Administered 2016-01-22: 50 ug/kg/min via INTRAVENOUS
  Filled 2016-01-22: qty 100

## 2016-01-22 MED ORDER — INSULIN ASPART 100 UNIT/ML ~~LOC~~ SOLN
0.0000 [IU] | SUBCUTANEOUS | Status: DC
  Administered 2016-01-22 – 2016-01-23 (×2): 2 [IU] via SUBCUTANEOUS
  Administered 2016-01-23 (×2): 1 [IU] via SUBCUTANEOUS
  Administered 2016-01-23: 2 [IU] via SUBCUTANEOUS
  Administered 2016-01-24 (×3): 1 [IU] via SUBCUTANEOUS
  Administered 2016-01-24: 2 [IU] via SUBCUTANEOUS
  Administered 2016-01-27: 1 [IU] via SUBCUTANEOUS

## 2016-01-22 MED ORDER — CHLORHEXIDINE GLUCONATE 0.12% ORAL RINSE (MEDLINE KIT)
15.0000 mL | Freq: Two times a day (BID) | OROMUCOSAL | Status: DC
  Administered 2016-01-22 – 2016-01-24 (×5): 15 mL via OROMUCOSAL

## 2016-01-22 MED ORDER — ANTISEPTIC ORAL RINSE SOLUTION (CORINZ)
7.0000 mL | Freq: Four times a day (QID) | OROMUCOSAL | Status: DC
  Administered 2016-01-22 – 2016-01-24 (×9): 7 mL via OROMUCOSAL

## 2016-01-22 MED ORDER — PROPOFOL 1000 MG/100ML IV EMUL
5.0000 ug/kg/min | INTRAVENOUS | Status: DC
  Administered 2016-01-22: 20 ug/kg/min via INTRAVENOUS
  Administered 2016-01-22 – 2016-01-23 (×2): 40 ug/kg/min via INTRAVENOUS
  Filled 2016-01-22 (×4): qty 100

## 2016-01-22 MED ORDER — MAGNESIUM SULFATE 4 GM/100ML IV SOLN
4.0000 g | Freq: Once | INTRAVENOUS | Status: AC
  Administered 2016-01-22: 4 g via INTRAVENOUS
  Filled 2016-01-22: qty 100

## 2016-01-22 MED ORDER — DIPHENHYDRAMINE HCL 12.5 MG/5ML PO ELIX
25.0000 mg | ORAL_SOLUTION | Freq: Four times a day (QID) | ORAL | Status: AC | PRN
  Administered 2016-01-22: 25 mg
  Filled 2016-01-22: qty 10

## 2016-01-22 MED ORDER — PROPOFOL 1000 MG/100ML IV EMUL
INTRAVENOUS | Status: AC
  Filled 2016-01-22: qty 100

## 2016-01-22 NOTE — Progress Notes (Signed)
RT NOTE:  Pt transported to 2S09 without event. Report given to [place], RT.

## 2016-01-22 NOTE — ED Notes (Signed)
Patient arrived via POV from home with complaint of tongue swelling. Upon arrival to room patient was alert and oriented, able to speak. Reported that she may be having a reaction to the chicken she ate earlier from [place]. Endorses paprika allergy. Also patient takes lisinopril.

## 2016-01-22 NOTE — Progress Notes (Signed)
Family in room with sedation decreased

## 2016-01-22 NOTE — Progress Notes (Signed)
Initial Nutrition Assessment  DOCUMENTATION CODES:   Not applicable  INTERVENTION:    If TF started, recommend initiation of Vital High Protein at goal rate of 50 ml/h (1200 ml per day) to provide 1200 kcals, 105 gm protein, 1003 ml free water daily.  NUTRITION DIAGNOSIS:   Inadequate oral intake related to inability to eat as evidenced by NPO status  GOAL:   Patient will meet greater than or equal to 90% of their needs  MONITOR:   Vent status, Labs, Weight trends, I & O's  REASON FOR ASSESSMENT:   Ventilator  ASSESSMENT:   75 y/o woman who presented to the ED with swollen tongue. She had prior episodes of similar presentations, but none this severe. Her family (and she) report some concerns with allergies to various things, such as paprika, fish, and possibly some tree branches.  Patient is currently intubated on ventilator support MV: 6.9 L/min Temp (24hrs), Avg:96.6 F (35.9 C), Min:96.2 F (35.7 C), Max:97.1 F (36.2 C)  OGT in place. No nutrition problems identified PTA. No muscle or subcutaneous fat depletion noticed. CCM note reviewed >> anticipate extubation in next 24-48 hours.  Diet Order:  Diet NPO time specified  Skin:  Reviewed, no issues  Last BM:  N/A  Height:   Ht Readings from Last 1 Encounters:  01/22/16 5' 4.02" (1.626 m)    Weight:   Wt Readings from Last 1 Encounters:  01/22/16 165 lb 9.1 oz (75.1 kg)    Ideal Body Weight:  54.5 kg  BMI:  Body mass index is 28.41 kg/m.  Estimated Nutritional Needs:   Kcal:  1229  Protein:  110-120 gm  Fluid:  per MD  EDUCATION NEEDS:   No education needs identified at this time  Arthur Holms, RD, LDN Pager #: [phone] After-Hours Pager #: [phone]

## 2016-01-22 NOTE — Progress Notes (Signed)
eLink Physician-Brief Progress Note Patient Name: CAMILYA CONTI DOB: 1940/09/02 MRN: [id number]   Date of Service  01/22/2016  HPI/Events of Note  Blood glucose = 165.   eICU Interventions  Will order Q 4 hour sensitive Novolog SSI.     Intervention Category Intermediate Interventions: Hyperglycemia - evaluation and treatment  Sommer,Steven Cornelia Copa 01/22/2016, 9:17 PM

## 2016-01-22 NOTE — Progress Notes (Signed)
PULMONARY / CRITICAL CARE MEDICINE   Name: Megan Hull MRN: [id number] DOB: Jan 02, 1941    ADMISSION DATE:  01/21/2016   CHIEF COMPLAINT:  Tongue swelling  HISTORY OF PRESENT ILLNESS:   Megan Hull is a 75 y/o woman who presented to the ED with swollen tongue. She had prior episodes of similar presentations, but none this severe. Her family (and she) report some concerns with allergies to various things, such as paprika, fish, and possibly some tree branches. On the evening of admission, she had gone to [place]. Later, she started experiencing tongue swelling. She took benadryl, but her tongue continued to swell. She did not have any rash or flushing. She had previously had a similar incident, but without clear exposure, for which she went to an urgent care center and was treated for benadryl only. In the ED, her tongue continued to swell and she was intubated for airway protection. She does endorse that her sister had similar experiences.    SUBJECTIVE:  75 y/o woman with tongue swelling.  VITAL SIGNS: BP (!) 167/89 (BP Location: Right Arm)   Pulse 62   Temp 97.1 F (36.2 C) (Axillary)   Resp 17   Ht 5' 4.02" (1.626 m)   Wt 165 lb 9.1 oz (75.1 kg)   SpO2 100%   BMI 28.41 kg/m   HEMODYNAMICS:    VENTILATOR SETTINGS: Vent Mode: PRVC FiO2 (%):  [40 %-50 %] 40 % Set Rate:  [16 bmp] 16 bmp Vt Set:  [440 mL] 440 mL PEEP:  [5 cmH20] 5 cmH20 Plateau Pressure:  [17 cmH20-19 cmH20] 17 cmH20  INTAKE / OUTPUT: I/O last 3 completed shifts: In: 358.8 [I.V.:308.8; IV Piggyback:50] Out: 580 [Urine:580]  PHYSICAL EXAMINATION: General:  Intubated and sedated on vent Neuro:  Sedated on propfol and fentanyl HEENT:  Grossly swollen tongue protruding from mouth. No lip involvement. Cardiovascular:  Normal heart sounds Lungs:  Clear lungs Abdomen:  Soft, normal BS Musculoskeletal:  Normal joints Skin:  No visible rashes  LABS:  BMET  Recent Labs Lab 01/22/16 0000   NA 139  K 3.7  CL 107  CO2 24  BUN 15  CREATININE 0.93  GLUCOSE 124*    Electrolytes  Recent Labs Lab 01/22/16 0000  CALCIUM 8.2*  MG 1.6*  PHOS 2.9    CBC  Recent Labs Lab 01/21/16 2158 01/22/16 0000  WBC 3.9* 6.8  HGB 7.0* 13.2  HCT 21.6* 40.4  PLT 99* 198    Coag's No results for input(s): APTT, INR in the last 168 hours.  Sepsis Markers  Recent Labs Lab 01/21/16 2218  LATICACIDVEN <0.30*    ABG No results for input(s): PHART, PCO2ART, PO2ART in the last 168 hours.  Liver Enzymes  Recent Labs Lab 01/22/16 0000  AST 20  ALT 16  ALKPHOS 61  BILITOT 0.8  ALBUMIN 3.4*    Cardiac Enzymes No results for input(s): TROPONINI, PROBNP in the last 168 hours.  Glucose No results for input(s): GLUCAP in the last 168 hours.  Imaging Dg Chest Portable 1 View  Result Date: 01/21/2016 CLINICAL DATA:  Endotracheal tube placement. EXAM: PORTABLE CHEST 1 VIEW COMPARISON:  Chest radiograph performed 01/14/2009 FINDINGS: The patient's endotracheal tube is seen ending 1 cm above the carina. This could be retracted 1-2 cm. An enteric tube is seen extending below the diaphragm. Mild vascular congestion is noted. No pleural effusion or pneumothorax is seen. The cardiomediastinal silhouette is normal in size. No acute osseous abnormalities are identified. IMPRESSION: 1.  Endotracheal tube seen ending 1 cm above the carina. This could be retracted 1-2 cm. 2. Mild vascular congestion noted.  Lungs remain grossly clear. Electronically Signed   By: Garald Balding M.D.   On: 01/21/2016 22:19     STUDIES:  (none pertinent)  CULTURES: None  ANTIBIOTICS: None  SIGNIFICANT EVENTS: Intubated in ED for airway protection  LINES/TUBES: ETT 7.0 8/11 PIV x2  DISCUSSION: 75 y/o woman presenting with angioedema  ASSESSMENT / PLAN:  PULMONARY A: Angioedema, possibly hereditary vs ARB related  Need for mechanical ventilation. Threatened airway P:   ED reports no  edema of vocal cords, swelling involving tongue only. Treat with solumedrol 125 q6, famotidine. Maintain on PRVC  SBT when lingual edema decreases  CARDIOVASCULAR A:  Hypertension P:  Labetalol PRN, restart home meds except for ARB  RENAL A:   Normal renal function P:    GASTROINTESTINAL A:   NPO P:   Anticipate extubation within 24-48 hrs  HEMATOLOGIC A:   No active issues P:   INFECTIOUS A:   No active concerns for infection P:    ENDOCRINE A:   No active issues   P:    NEUROLOGIC A:   Need for sedation P:   Propofol RASS goal: 0   FAMILY  - Updates: Updated on admission  - Inter-disciplinary family meet or Palliative Care meeting due by:  8/18  Megan Hull ACNP Megan Hull PCCM Pager [phone] till 3 pm If no answer page [phone] 01/22/2016, 11:05 AM

## 2016-01-23 LAB — GLUCOSE, CAPILLARY
GLUCOSE-CAPILLARY: 185 mg/dL — AB (ref 65–99)
GLUCOSE-CAPILLARY: 164 mg/dL — AB (ref 65–99)
Glucose-Capillary: 122 mg/dL — ABNORMAL HIGH (ref 65–99)
Glucose-Capillary: 118 mg/dL — ABNORMAL HIGH (ref 65–99)
Glucose-Capillary: 137 mg/dL — ABNORMAL HIGH (ref 65–99)

## 2016-01-23 LAB — BASIC METABOLIC PANEL
Anion gap: 11 (ref 5–15)
BUN: 11 mg/dL (ref 6–20)
CALCIUM: 8.1 mg/dL — AB (ref 8.9–10.3)
CO2: 21 mmol/L — AB (ref 22–32)
Chloride: 104 mmol/L (ref 101–111)
Creatinine, Ser: 0.82 mg/dL (ref 0.44–1.00)
Glucose, Bld: 168 mg/dL — ABNORMAL HIGH (ref 65–99)
Potassium: 4.2 mmol/L (ref 3.5–5.1)
SODIUM: 136 mmol/L (ref 135–145)

## 2016-01-23 LAB — C3 COMPLEMENT: C3 COMPLEMENT: 114 mg/dL (ref 82–167)

## 2016-01-23 LAB — CBC
HEMATOCRIT: 37.1 % (ref 36.0–46.0)
Hemoglobin: 12.3 g/dL (ref 12.0–15.0)
MCH: 30.6 pg (ref 26.0–34.0)
MCHC: 33.2 g/dL (ref 30.0–36.0)
MCV: 92.3 fL (ref 78.0–100.0)
PLATELETS: 186 10*3/uL (ref 150–400)
RBC: 4.02 MIL/uL (ref 3.87–5.11)
RDW: 13.8 % (ref 11.5–15.5)
WBC: 8.7 10*3/uL (ref 4.0–10.5)

## 2016-01-23 LAB — C4 COMPLEMENT: Complement C4, Body Fluid: 34 mg/dL (ref 14–44)

## 2016-01-23 MED ORDER — DEXMEDETOMIDINE HCL IN NACL 200 MCG/50ML IV SOLN
0.4000 ug/kg/h | INTRAVENOUS | Status: DC
  Administered 2016-01-23: 0.8 ug/kg/h via INTRAVENOUS
  Administered 2016-01-23: 0.7 ug/kg/h via INTRAVENOUS
  Administered 2016-01-23: 0.5 ug/kg/h via INTRAVENOUS
  Administered 2016-01-24: 0.8 ug/kg/h via INTRAVENOUS
  Filled 2016-01-23 (×5): qty 50

## 2016-01-23 MED ORDER — FENTANYL CITRATE (PF) 100 MCG/2ML IJ SOLN
25.0000 ug | INTRAMUSCULAR | Status: DC | PRN
  Administered 2016-01-23 – 2016-01-24 (×2): 100 ug via INTRAVENOUS
  Filled 2016-01-23 (×2): qty 2

## 2016-01-23 NOTE — Progress Notes (Signed)
[facility] Progress Note Patient Name: Megan Hull DOB: [birth year] MRN: [id number]   Date of Service  01/23/2016  HPI/Events of Note  Nursing requesting restart pain meds as failed cuff leak  eICU Interventions  Restart prn fentanyl     Intervention Category Minor Interventions: Routine modifications to care plan (e.g. PRN medications for pain, fever)  Christinia Gully 01/23/2016, 8:59 PM

## 2016-01-23 NOTE — Progress Notes (Signed)
PULMONARY / CRITICAL CARE MEDICINE   Name: Megan Hull MRN: [id number] DOB: 12-25-1940    ADMISSION DATE:  01/21/2016   CHIEF COMPLAINT:  Tongue swelling  HISTORY OF PRESENT ILLNESS:   Megan Hull is a 75 y/o woman who presented to the ED with swollen tongue. She had prior episodes of similar presentations, but none this severe. Her family (and she) report some concerns with allergies to various things, such as paprika, fish, and possibly some tree branches. On the evening of admission, she had gone to [place]. Later, she started experiencing tongue swelling. She took benadryl, but her tongue continued to swell. She did not have any rash or flushing. She had previously had a similar incident, but without clear exposure, for which she went to an urgent care center and was treated for benadryl only. In the ED, her tongue continued to swell and she was intubated for airway protection. She does endorse that her sister had similar experiences.    SUBJECTIVE:  75 y/o woman with tongue swelling.Will wake up and extubate  VITAL SIGNS: BP (!) 89/50 (BP Location: Right Arm)   Pulse 66   Temp 97.8 F (36.6 C) (Oral)   Resp 16   Ht 5' 4.02" (1.626 m)   Wt 160 lb 11.5 oz (72.9 kg)   SpO2 98%   BMI 27.57 kg/m   HEMODYNAMICS:    VENTILATOR SETTINGS: Vent Mode: PRVC FiO2 (%):  [40 %] 40 % Set Rate:  [16 bmp] 16 bmp Vt Set:  [440 mL] 440 mL PEEP:  [5 cmH20] 5 cmH20 Plateau Pressure:  [15 cmH20-17 cmH20] 17 cmH20  INTAKE / OUTPUT: I/O last 3 completed shifts: In: 995.6 [I.V.:695.6; NG/GT:150; IV Piggyback:150] Out: [id number] [Urine:1705; Emesis/NG output:20]  PHYSICAL EXAMINATION: General:  Intubated and sedated on vent Neuro:  Sedated on propfol and fentanyl, turned off but still sleepy HEENT: Less swollen tongue less  protruding from mouth. No lip involvement. Cardiovascular:  Normal heart sounds Lungs:  Clear lungs Abdomen:  Soft, normal BS Musculoskeletal:  Normal  joints Skin:  No visible rashes  LABS:  BMET  Recent Labs Lab 01/22/16 0000 01/23/16 0426  NA 139 136  K 3.7 4.2  CL 107 104  CO2 24 21*  BUN 15 11  CREATININE 0.93 0.82  GLUCOSE 124* 168*    Electrolytes  Recent Labs Lab 01/22/16 0000 01/23/16 0426  CALCIUM 8.2* 8.1*  MG 1.6*  --   PHOS 2.9  --     CBC  Recent Labs Lab 01/21/16 2158 01/22/16 0000 01/23/16 0426  WBC 3.9* 6.8 8.7  HGB 7.0* 13.2 12.3  HCT 21.6* 40.4 37.1  PLT 99* 198 186    Coag's No results for input(s): APTT, INR in the last 168 hours.  Sepsis Markers  Recent Labs Lab 01/21/16 2218  LATICACIDVEN <0.30*    ABG No results for input(s): PHART, PCO2ART, PO2ART in the last 168 hours.  Liver Enzymes  Recent Labs Lab 01/22/16 0000  AST 20  ALT 16  ALKPHOS 61  BILITOT 0.8  ALBUMIN 3.4*    Cardiac Enzymes No results for input(s): TROPONINI, PROBNP in the last 168 hours.  Glucose  Recent Labs Lab 01/22/16 1939 01/22/16 2302 01/23/16 0430 01/23/16 0838  GLUCAP 165* 167* 185* 164*    Imaging No results found.   STUDIES:  (none pertinent)  CULTURES: None  ANTIBIOTICS: None  SIGNIFICANT EVENTS: Intubated in ED for airway protection  LINES/TUBES: ETT 7.0 8/11 PIV x2  DISCUSSION: 75 y/o  woman presenting with angioedema  ASSESSMENT / PLAN:  PULMONARY A: Angioedema, possibly hereditary vs ARB related  Need for mechanical ventilation. Threatened airway P:   ED reports no edema of vocal cords, swelling involving tongue only. Treat with solumedrol 125 q6, famotidine. Maintain on PRVC  Plan to extubate 8/13 when awake  CARDIOVASCULAR A:  Hypertension P:  Labetalol PRN, restart home meds except for ARB  RENAL A:   Normal renal function P:    GASTROINTESTINAL A:   NPO P:   Anticipate extubation 8/13  HEMATOLOGIC A:   No active issues P:   INFECTIOUS A:   No active concerns for infection P:    ENDOCRINE A:   No active issues    P:    NEUROLOGIC A:   Need for sedation, stopped at 0830 for planned extubation P:   Propofol RASS goal: 0   FAMILY  - Updates: Updated on admission, no family at bedside  - Inter-disciplinary family meet or Palliative Care meeting due by:  8/18  [name] ACNP [name] PCCM Pager [phone] till 3 pm If no answer page [phone] 01/23/2016, 9:01 AM

## 2016-01-23 NOTE — Progress Notes (Signed)
rn reports patient now agitated  Plan Start precedex -> do sbt -> if well -> then cuff leak -> if ok; extubate 01/23/2016  Dr. Brand Males, M.D., F.C.C.P Pulmonary and Critical Care Medicine Staff Physician [facility] Pager: [phone], If no answer or between  15:00h - 7:00h: call [phone]  01/23/2016 1:53 PM

## 2016-01-23 NOTE — Progress Notes (Signed)
CCM MD called, Pt very agitated, requiring 4 RNs to keep pt from pulling ETT out. Pt attempting to punch and kick RNs and RT. Orders given to start on precedex drip. Will cont to monitor and assess

## 2016-01-23 NOTE — Progress Notes (Signed)
MD called pt now calmer with precedex, attempted SBT and cuff leak checked by RT. No cuff leak present reported. MD said do not extubate today, keep pt calm on precedex. Pt was still attempting to pull out ETT when waking up so orders for restraints were given. Will cont to monitor and assess

## 2016-01-24 ENCOUNTER — Inpatient Hospital Stay (HOSPITAL_COMMUNITY): Payer: Commercial Managed Care - HMO

## 2016-01-24 DIAGNOSIS — T783XXA Angioneurotic edema, initial encounter: Principal | ICD-10-CM

## 2016-01-24 LAB — CBC
HEMATOCRIT: 39.1 % (ref 36.0–46.0)
HEMOGLOBIN: 13.1 g/dL (ref 12.0–15.0)
MCH: 30.5 pg (ref 26.0–34.0)
MCHC: 33.5 g/dL (ref 30.0–36.0)
MCV: 91.1 fL (ref 78.0–100.0)
Platelets: 191 10*3/uL (ref 150–400)
RBC: 4.29 MIL/uL (ref 3.87–5.11)
RDW: 14 % (ref 11.5–15.5)
WBC: 11.1 10*3/uL — ABNORMAL HIGH (ref 4.0–10.5)

## 2016-01-24 LAB — GLUCOSE, CAPILLARY
GLUCOSE-CAPILLARY: 116 mg/dL — AB (ref 65–99)
GLUCOSE-CAPILLARY: 116 mg/dL — AB (ref 65–99)
GLUCOSE-CAPILLARY: 123 mg/dL — AB (ref 65–99)
GLUCOSE-CAPILLARY: 143 mg/dL — AB (ref 65–99)
GLUCOSE-CAPILLARY: 144 mg/dL — AB (ref 65–99)
GLUCOSE-CAPILLARY: 148 mg/dL — AB (ref 65–99)
Glucose-Capillary: 155 mg/dL — ABNORMAL HIGH (ref 65–99)

## 2016-01-24 LAB — BASIC METABOLIC PANEL
ANION GAP: 8 (ref 5–15)
BUN: 19 mg/dL (ref 6–20)
CALCIUM: 8.3 mg/dL — AB (ref 8.9–10.3)
CO2: 23 mmol/L (ref 22–32)
Chloride: 107 mmol/L (ref 101–111)
Creatinine, Ser: 0.78 mg/dL (ref 0.44–1.00)
Glucose, Bld: 152 mg/dL — ABNORMAL HIGH (ref 65–99)
POTASSIUM: 3.6 mmol/L (ref 3.5–5.1)
Sodium: 138 mmol/L (ref 135–145)

## 2016-01-24 LAB — C1 ESTERASE INHIBITOR: C1INH SerPl-mCnc: 30 mg/dL (ref 21–39)

## 2016-01-24 LAB — MAGNESIUM: MAGNESIUM: 2.7 mg/dL — AB (ref 1.7–2.4)

## 2016-01-24 LAB — PHOSPHORUS: PHOSPHORUS: 2.7 mg/dL (ref 2.5–4.6)

## 2016-01-24 MED ORDER — HYDRALAZINE HCL 20 MG/ML IJ SOLN
10.0000 mg | INTRAMUSCULAR | Status: DC | PRN
Start: 2016-01-24 — End: 2016-01-29
  Administered 2016-01-24 – 2016-01-25 (×2): 10 mg via INTRAVENOUS
  Filled 2016-01-24 (×3): qty 1

## 2016-01-24 MED ORDER — CETYLPYRIDINIUM CHLORIDE 0.05 % MT LIQD
7.0000 mL | Freq: Two times a day (BID) | OROMUCOSAL | Status: DC
  Administered 2016-01-24 – 2016-01-29 (×11): 7 mL via OROMUCOSAL

## 2016-01-24 NOTE — Procedures (Signed)
Extubation Procedure Note  Patient Details:   Name: Megan Hull DOB: May 27, 1941 MRN: [id number]   Airway Documentation:     Evaluation  O2 sats: stable throughout Complications: No apparent complications Patient did tolerate procedure well. Bilateral Breath Sounds: Clear   Yes   Patient was extubated, by MD, with use of bougie to 3L nasal cannula.  Patient able to speak post extubation.  No evidence of stridor.  Sats currently 100%.  Vitals are stable.  No apparent complications.  Philomena Doheny 01/24/2016, 9:34 AM

## 2016-01-24 NOTE — Progress Notes (Signed)
New weakness and paresthesia of left LE. No other focal def  Plan Will MR lumbar and sacrum    Erick Colace ACNP-BC [place] Pager # [phone] OR # [phone] if no answer

## 2016-01-24 NOTE — Progress Notes (Signed)
PULMONARY / CRITICAL CARE MEDICINE   Name: Megan Hull MRN: [id number] DOB: Feb 29, 1941    ADMISSION DATE:  01/21/2016   CHIEF COMPLAINT:  Tongue swelling  HISTORY OF PRESENT ILLNESS:   Megan Hull is a 75 y/o woman who presented to the ED with swollen tongue. She had prior episodes of similar presentations, but none this severe. Her family (and she) report some concerns with allergies to various things, such as paprika, fish, and possibly some tree branches. On the evening of admission, she had gone to [place]. Later, she started experiencing tongue swelling. She took benadryl, but her tongue continued to swell. She did not have any rash or flushing. She had previously had a similar incident, but without clear exposure, for which she went to an urgent care center and was treated for benadryl only. In the ED, her tongue continued to swell and she was intubated for airway protection. She does endorse that her sister had similar experiences.    SUBJECTIVE:  Afebrile Intubated,a wake on low dose precedex gtt  VITAL SIGNS: BP (!) 166/84   Pulse 72   Temp (!) 96.8 F (36 C) (Axillary)   Resp (!) 23   Ht 5' 4.02" (1.626 m)   Wt 75.3 kg (166 lb 0.1 oz)   SpO2 100%   BMI 28.48 kg/m   HEMODYNAMICS:    VENTILATOR SETTINGS: Vent Mode: PRVC FiO2 (%):  [40 %] 40 % Set Rate:  [16 bmp] 16 bmp Vt Set:  [440 mL-460 mL] 440 mL PEEP:  [5 cmH20] 5 cmH20 Pressure Support:  [10 cmH20] 10 cmH20 Plateau Pressure:  [11 cmH20-14 cmH20] 14 cmH20  INTAKE / OUTPUT: I/O last 3 completed shifts: In: 976.5 [I.V.:556.5; NG/GT:270; IV Piggyback:150] Out: [id number] [Urine:1530; Emesis/NG output:50]  PHYSICAL EXAMINATION: General:  Intubated and sedated on vent Neuro:  On precedex, follows commands HEENT: Less swollen tongue. No lip involvement. Cardiovascular:  Normal heart sounds Lungs:  Clear lungs Abdomen:  Soft, normal BS Musculoskeletal:  Normal joints Skin:  No visible  rashes  LABS:  BMET  Recent Labs Lab 01/22/16 0000 01/23/16 0426 01/24/16 0320  NA 139 136 138  K 3.7 4.2 3.6  CL 107 104 107  CO2 24 21* 23  BUN 15 11 19   CREATININE 0.93 0.82 0.78  GLUCOSE 124* 168* 152*    Electrolytes  Recent Labs Lab 01/22/16 0000 01/23/16 0426 01/24/16 0320  CALCIUM 8.2* 8.1* 8.3*  MG 1.6*  --  2.7*  PHOS 2.9  --  2.7    CBC  Recent Labs Lab 01/22/16 0000 01/23/16 0426 01/24/16 0320  WBC 6.8 8.7 11.1*  HGB 13.2 12.3 13.1  HCT 40.4 37.1 39.1  PLT 198 186 191    Coag's No results for input(s): APTT, INR in the last 168 hours.  Sepsis Markers  Recent Labs Lab 01/21/16 2218  LATICACIDVEN <0.30*    ABG No results for input(s): PHART, PCO2ART, PO2ART in the last 168 hours.  Liver Enzymes  Recent Labs Lab 01/22/16 0000  AST 20  ALT 16  ALKPHOS 61  BILITOT 0.8  ALBUMIN 3.4*    Cardiac Enzymes No results for input(s): TROPONINI, PROBNP in the last 168 hours.  Glucose  Recent Labs Lab 01/23/16 1240 01/23/16 1608 01/23/16 2019 01/24/16 0000 01/24/16 0344 01/24/16 0812  GLUCAP 122* 118* 137* 144* 143* 155*    Imaging Dg Chest Port 1 View  Result Date: 01/24/2016 CLINICAL DATA:  Edema. EXAM: PORTABLE CHEST 1 VIEW COMPARISON:  01/21/2016. FINDINGS:  Endotracheal tube and NG tube in stable position. Heart size normal. Low lung volumes with mild bibasilar atelectasis and/or infiltrates. Small left pleural effusion. No pneumothorax. Prior cervical spine fusion IMPRESSION: 1. Lines and tubes in stable position. 2. Low lung volumes with mild bibasilar atelectasis and/or infiltrates. Small left pleural effusion. Electronically Signed   By: Marcello Moores  Register   On: 01/24/2016 06:59     STUDIES:  (none pertinent)  CULTURES: None  ANTIBIOTICS: None  SIGNIFICANT EVENTS: Intubated in ED for airway protection  LINES/TUBES: ETT 7.0 8/11 >> 8/14 PIV x2  DISCUSSION: 75 y/o woman presenting with angioedema ?  trigger  ASSESSMENT / PLAN:  PULMONARY A: Angioedema, possibly hereditary vs ARB related  Acute resp failure -ED reports no edema of vocal cords, swelling involving tongue only. P:   Dc solumedrol, famotidine.  extubated  With bougie which was then removed once no stridor noted Allergy evaluation as outpt CARDIOVASCULAR A:  Hypertension P:  Labetalol PRN, restart home meds except for ARB  RENAL A:   Normal renal function P:    GASTROINTESTINAL A:   NPO P:   Sips & chips & advance   NEUROLOGIC A:   Need for sedation, stopped at 0830 for planned extubation P:   Dc precedex RASS goal: 0 oob   FAMILY  - Updates: Updated pt, no family at bedside  - Inter-disciplinary family meet or Palliative Care meeting due by:  8/18  My cc time x 48m  Kara Mead MD. [facility]. [place] [facility] Pager [phone] If no response call [phone]    01/24/2016, 9:34 AM

## 2016-01-24 NOTE — Progress Notes (Signed)
eLink Physician-Brief Progress Note Patient Name: Megan Hull DOB: 09-Feb-1941 MRN: [id number]   Date of Service  01/24/2016  HPI/Events of Note  Hypertension - BP = 188/83.  eICU Interventions  Will order: 1. Hydralazine 10 mg IV Q 4 hours PRN SBP > 160.     Intervention Category Intermediate Interventions: Hypertension - evaluation and management  Sommer,Steven Eugene 01/24/2016, 1:03 AM

## 2016-01-24 NOTE — Progress Notes (Signed)
eLink Physician-Brief Progress Note Patient Name: Megan Hull DOB: 1940-12-08 MRN: [id number]   Date of Service  01/24/2016  HPI/Events of Note  Patient with dysphagia post extubation. Did receive AM medications with exception of Lopressor for bradycardia.  eICU Interventions  Speech Eval ordered.  Patient to remain NPO for now.     Intervention Category Intermediate Interventions: Other:  Tera Partridge 01/24/2016, 7:15 PM

## 2016-01-25 ENCOUNTER — Inpatient Hospital Stay (HOSPITAL_COMMUNITY): Payer: Commercial Managed Care - HMO

## 2016-01-25 DIAGNOSIS — H539 Unspecified visual disturbance: Secondary | ICD-10-CM

## 2016-01-25 DIAGNOSIS — M21372 Foot drop, left foot: Secondary | ICD-10-CM

## 2016-01-25 DIAGNOSIS — R29898 Other symptoms and signs involving the musculoskeletal system: Secondary | ICD-10-CM

## 2016-01-25 LAB — GLUCOSE, CAPILLARY
GLUCOSE-CAPILLARY: 111 mg/dL — AB (ref 65–99)
GLUCOSE-CAPILLARY: 104 mg/dL — AB (ref 65–99)
Glucose-Capillary: 107 mg/dL — ABNORMAL HIGH (ref 65–99)
Glucose-Capillary: 109 mg/dL — ABNORMAL HIGH (ref 65–99)
Glucose-Capillary: 114 mg/dL — ABNORMAL HIGH (ref 65–99)

## 2016-01-25 LAB — PHOSPHORUS: PHOSPHORUS: 2.9 mg/dL (ref 2.5–4.6)

## 2016-01-25 LAB — MAGNESIUM: MAGNESIUM: 2.7 mg/dL — AB (ref 1.7–2.4)

## 2016-01-25 LAB — TRYPTASE: Tryptase: 4.7 ug/L (ref 2.2–13.2)

## 2016-01-25 MED ORDER — METOPROLOL TARTRATE 25 MG PO TABS: 25.0000 mg | ORAL_TABLET | Freq: Two times a day (BID) | ORAL | Status: DC

## 2016-01-25 MED ORDER — ACETAMINOPHEN 325 MG PO TABS
650.0000 mg | ORAL_TABLET | ORAL | Status: DC | PRN
  Filled 2016-01-25: qty 2

## 2016-01-25 MED ORDER — SODIUM CHLORIDE 0.9 % IV SOLN
INTRAVENOUS | Status: DC
  Administered 2016-01-25 – 2016-01-27 (×5): via INTRAVENOUS

## 2016-01-25 MED ORDER — METOPROLOL TARTRATE 5 MG/5ML IV SOLN
2.5000 mg | Freq: Four times a day (QID) | INTRAVENOUS | Status: DC
  Administered 2016-01-25 – 2016-01-27 (×7): 2.5 mg via INTRAVENOUS
  Filled 2016-01-25 (×7): qty 5

## 2016-01-25 MED ORDER — ACETAMINOPHEN 10 MG/ML IV SOLN
1000.0000 mg | Freq: Four times a day (QID) | INTRAVENOUS | Status: AC | PRN
  Administered 2016-01-25 – 2016-01-26 (×2): 1000 mg via INTRAVENOUS
  Filled 2016-01-25 (×4): qty 100

## 2016-01-25 MED ORDER — LORAZEPAM 2 MG/ML IJ SOLN
0.5000 mg | Freq: Once | INTRAMUSCULAR | Status: AC
  Administered 2016-01-25: 0.5 mg via INTRAVENOUS
  Filled 2016-01-25: qty 1

## 2016-01-25 NOTE — Care Management Important Message (Signed)
Important Message  Patient Details  Name: Megan Hull MRN: [id number] Date of Birth: 1940/12/02   Medicare Important Message Given:  Yes    Nathen May 01/25/2016, 10:35 AM

## 2016-01-25 NOTE — Evaluation (Signed)
Clinical/Bedside Swallow Evaluation Patient Details  Name: Megan Hull MRN: [id number] Date of Birth: 02-15-1941  Today's Date: 01/25/2016 Time: SLP Start Time (ACUTE ONLY): 0950 SLP Stop Time (ACUTE ONLY): 1003 SLP Time Calculation (min) (ACUTE ONLY): 13 min  Past Medical History:  Past Medical History:  Diagnosis Date  . Arthritis   . Diabetes mellitus without complication (HCC)    borderline  . Diarrhea   . Diverticulosis   . Dysrhythmia    controlled with Metoprolol-12-19-13 LOV -Dr. Gwenlyn Found sees yearly  . Family history of premature CAD   . GERD (gastroesophageal reflux disease)   . Heart murmur    yrs ago  . HLD (hyperlipidemia)   . Hypertension   . Lower extremity edema   . Status post dilation of esophageal narrowing   . Supraventricular arrhythmia   . Tobacco abuse    Past Surgical History:  Past Surgical History:  Procedure Laterality Date  . ANTERIOR CERVICAL DECOMP/DISCECTOMY FUSION N/A 06/08/2015   Procedure: CERVICAL FIVE-SIX ,CERVICAL SIX-SEVEN ANTERIOR CERVICAL DECOMPRESSION/DISCECTOMY FUSION PLATING BONEGRAFT;  Surgeon: Kristeen Miss, MD;  Location: [place] [facility];  Service: Neurosurgery;  Laterality: N/A;  . 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 Adora Fridge)  . COLONOSCOPY    . ESOPHAGEAL DILATION  2014  . infected lymph node surgery  80's  . NM MYOCAR PERF WALL MOTION  04/2011   bruce myoview; normal pattern of perfusion, EF 80%, low risk scan  . TONSILLECTOMY  70's  . TRANSTHORACIC ECHOCARDIOGRAM  12/2007   mild conc LVH   HPI:  75 y/o woman who presented to the ED with swollen tongue. She had prior episodes of similar presentations, but none this severe. She required intubation 8/11-8/14. Pt reportedly wtih difficulty swallowing post-extubation. She shares that she had her esophagus stretched ~5 years ago, but has not had any difficulty swallowing since that time.   Assessment / Plan / Recommendation Clinical  Impression  Pt has signs of an acute, reversible dysphagia s/p intubation with frequent coughing elicited from even single ice chip trials. Vocal quality is deep and hoarse, although it does not sound wet across trials. Recommend that pt remain NPO for today with good prognosis for diet advancement with additional time post-extubation.    Aspiration Risk  Moderate aspiration risk;Severe aspiration risk    Diet Recommendation NPO   Medication Administration: Via alternative means    Other  Recommendations Oral Care Recommendations: Oral care QID Other Recommendations: Have oral suction available   Follow up Recommendations   (tba)    Frequency and Duration min 2x/week  2 weeks       Prognosis Prognosis for Safe Diet Advancement: Good      Swallow Study   General HPI: 75 y/o woman who presented to the ED with swollen tongue. She had prior episodes of similar presentations, but none this severe. She required intubation 8/11-8/14. Pt reportedly wtih difficulty swallowing post-extubation. She shares that she had her esophagus stretched ~5 years ago, but has not had any difficulty swallowing since that time. Type of Study: Bedside Swallow Evaluation Previous Swallow Assessment: none in chart Diet Prior to this Study: NPO Temperature Spikes Noted: No Respiratory Status: Nasal cannula History of Recent Intubation: Yes Length of Intubations (days): 3 days Date extubated: 01/24/16 Behavior/Cognition: Alert;Cooperative;Pleasant mood Oral Cavity Assessment: Dry Oral Care Completed by SLP: No Oral Cavity - Dentition: Edentulous Vision: Functional for self-feeding Self-Feeding Abilities: Able to feed self Patient Positioning: Upright in  chair Baseline Vocal Quality: Hoarse;Other (comment) (deep pitch) Volitional Cough: Strong    Oral/Motor/Sensory Function Overall Oral Motor/Sensory Function: Within functional limits   Ice Chips Ice chips: Impaired Presentation: Spoon Pharyngeal  Phase Impairments: Suspected delayed Swallow;Cough - Immediate;Cough - Delayed   Thin Liquid Thin Liquid: Not tested    Nectar Thick Nectar Thick Liquid: Not tested   Honey Thick Honey Thick Liquid: Not tested   Puree Puree: Not tested   Solid   GO   Solid: Not tested        Germain Osgood 01/25/2016,10:21 AM  Germain Osgood, M.A. CCC-SLP [phone]

## 2016-01-25 NOTE — Progress Notes (Signed)
Pt c/o of swelling of left inner buccal mucosa. Doctor [place] paged at [phone]. Instruction given to monitor patient until morning. Pt also c/o of left leg pain, order for acetaminophen placed by doctor Simonds.

## 2016-01-25 NOTE — Consult Note (Signed)
NEURO HOSPITALIST CONSULT NOTE   Requestig physician: Dr. Wyvonna Plum   Reason for Consult: lower extremity weakness   History obtained from:  chart  HPI:                                                                                                                                          Megan Hull is an 75 y.o. female who presented to the ED with swollen tongue. Angioedema required intubation. patient was extubated on 8/14.  At that time she expressed she had weakness and numbness of her left LE. Patient was intubated on 8/11. She states that she noted upon waking that she felt tingling and numbness in her left leg from her knee down. She states that she was trying to tell the nurses however due to being intubated she could not express herself. She does recall stain on her left side for the majority of the time in ICU. On extubation she was finally able to explain that she could not feel or move her left leg. Initially it was a complete numbness. At this point she states she feels a burning and tingling sensation. In fact she states it is more sensitive on the left than the right. However, she still has no movement in her left foot and ankle. She states she has never had this issue before.  In addition, she has noted that upon extubation she saw yellow circles in all of her vision. And now she is seeing "medical cobwebs throughout her whole vision". She denies headache, nausea vomiting. She states the cobwebs are in both eyes. As she is closed one eye and then the other and it is still present.  Past Medical History:  Diagnosis Date  . Arthritis   . Diabetes mellitus without complication (HCC)    borderline  . Diarrhea   . Diverticulosis   . Dysrhythmia    controlled with Metoprolol-12-19-13 LOV -Dr. Gwenlyn Found sees yearly  . Family history of premature CAD   . GERD (gastroesophageal reflux disease)   . Heart murmur    yrs ago  . HLD (hyperlipidemia)   .  Hypertension   . Lower extremity edema   . Status post dilation of esophageal narrowing   . Supraventricular arrhythmia   . Tobacco abuse     Past Surgical History:  Procedure Laterality Date  . ANTERIOR CERVICAL DECOMP/DISCECTOMY FUSION N/A 06/08/2015   Procedure: CERVICAL FIVE-SIX ,CERVICAL SIX-SEVEN ANTERIOR CERVICAL DECOMPRESSION/DISCECTOMY FUSION PLATING BONEGRAFT;  Surgeon: Kristeen Miss, MD;  Location: [place] [facility];  Service: Neurosurgery;  Laterality: N/A;  . 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 Adora Fridge)  . COLONOSCOPY    . ESOPHAGEAL DILATION  2014  . infected lymph node surgery  80's  .  NM MYOCAR PERF WALL MOTION  04/2011   bruce myoview; normal pattern of perfusion, EF 80%, low risk scan  . TONSILLECTOMY  70's  . TRANSTHORACIC ECHOCARDIOGRAM  12/2007   mild conc LVH    Family History  Problem Relation Age of Onset  . Heart disease Brother     MI @ 26, HTN @ 63, DM @ 29   . Heart disease Father     MI  . Heart disease Mother     MI, CVA @ 24  . Heart disease Sister     CHF, CVA  . Esophageal cancer Brother   . Alzheimer's disease Sister   . Heart disease Brother     MI in 75s, lung cancer  . Heart disease Sister   . Hypertension Sister     x2  . Hyperlipidemia Sister     x2  . Diabetes Sister     x2  . Cancer Sister   . Hypertension Brother   . Colon cancer Neg Hx     Social History:  reports that she has been smoking Cigarettes.  She has a 3.75 pack-year smoking history. She has never used smokeless tobacco. She reports that she drinks about 12.6 oz of alcohol per week . She reports that she does not use drugs.  Allergies  Allergen Reactions  . Nexium [Esomeprazole Magnesium] Anaphylaxis    Pt states she was unable to swallow for several hours after taking the nexium  . Other Hives, Shortness Of Breath and Other (See Comments)    Red Fish (Hives) Bojangle's chicken (swelling and shortness of breath) PAPRIKA   . Codeine Other (See Comments)    REACTION: chest pain  . Monosodium Glutamate Other (See Comments)    dizziness  . Shellfish Allergy Hives  . Tape Rash    Coban wrap turned her arm red (after stress test)    MEDICATIONS:                                                                                                                     Prior to Admission:  Prescriptions Prior to Admission  Medication Sig Dispense Refill Last Dose  . aspirin 81 MG tablet Take 162 mg by mouth daily.    01/21/2016  . cetirizine (ZYRTEC) 10 MG tablet Take 10 mg by mouth daily as needed for allergies.   unknown  . diphenhydrAMINE (BENADRYL) 25 MG tablet Take 2 tablets (50 mg total) by mouth every 6 (six) hours as needed for allergies. (Patient taking differently: Take 50 mg by mouth once as needed (severe allergic reaction). ) 30 tablet 0 01/21/2016 at pm  . furosemide (LASIX) 20 MG tablet Take 1 tablet (20 mg total) by mouth daily. (Patient taking differently: Take 10 mg by mouth daily. ) 90 tablet 3 01/21/2016  . metoprolol tartrate (LOPRESSOR) 25 MG tablet TAKE 1/2 TABLET TWICE DAILY 90 tablet 3 01/21/2016 at 800  . potassium chloride SA (K-DUR,KLOR-CON) 20 MEQ tablet TAKE 1 TABLET EVERY DAY (  Patient taking differently: TAKE 1 TABLET EVERY DAY AFTER SUPPER) 90 tablet 3 01/21/2016  . simvastatin (ZOCOR) 40 MG tablet Take 1 tablet (40 mg total) by mouth daily. (Patient taking differently: Take 40 mg by mouth every 3 (three) days. ) 90 tablet 3 maybe 8/9  . valsartan (DIOVAN) 160 MG tablet Take 80 mg by mouth daily after supper.   01/21/2016  . EPINEPHrine 0.3 mg/0.3 mL IJ SOAJ injection Inject 0.3 mLs (0.3 mg total) into the muscle once as needed (anaphylaxis). (Patient not taking: Reported on 01/24/2016) 2 Device 0 Not Taking at Unknown time   Scheduled: . antiseptic oral rinse  7 mL Mouth Rinse BID  . aspirin  162 mg Oral q morning - 10a  . enoxaparin (LOVENOX) injection  40 mg Subcutaneous Q24H  . furosemide  20  mg Oral Daily  . insulin aspart  0-9 Units Subcutaneous Q4H  . metoprolol tartrate  25 mg Oral BID  . simvastatin  40 mg Oral q1800     ROS:                                                                                                                                       History obtained from the patient  General ROS: negative for - chills, fatigue, fever, night sweats, weight gain or weight loss Psychological ROS: negative for - behavioral disorder, hallucinations, memory difficulties, mood swings or suicidal ideation Ophthalmic ROS: negative for - blurry vision, double vision, eye pain or loss of vision ENT ROS: negative for - epistaxis, nasal discharge, oral lesions, sore throat, tinnitus or vertigo Allergy and Immunology ROS: negative for - hives or itchy/watery eyes Hematological and Lymphatic ROS: negative for - bleeding problems, bruising or swollen lymph nodes Endocrine ROS: negative for - galactorrhea, hair pattern changes, polydipsia/polyuria or temperature intolerance Respiratory ROS: negative for - cough, hemoptysis, shortness of breath or wheezing Cardiovascular ROS: negative for - chest pain, dyspnea on exertion, edema or irregular heartbeat Gastrointestinal ROS: negative for - abdominal pain, diarrhea, hematemesis, nausea/vomiting or stool incontinence Genito-Urinary ROS: negative for - dysuria, hematuria, incontinence or urinary frequency/urgency Musculoskeletal ROS: positive for - muscular weakness Neurological ROS: as noted in HPI Dermatological ROS: negative for rash and skin lesion changes   Blood pressure (!) 143/67, pulse 83, temperature 98.7 F (37.1 C), temperature source Oral, resp. rate 17, height 5' 4.02" (1.626 m), weight 73.4 kg (161 lb 13.1 oz), SpO2 100 %.   Neurologic Examination:  HEENT-  Normocephalic, no lesions, without obvious abnormality.  Normal  external eye and conjunctiva.  Normal TM's bilaterally.  Normal auditory canals and external ears. Normal external nose, mucus membranes and septum.  Normal pharynx. Cardiovascular- S1, S2 normal, pulses palpable throughout   Lungs- chest clear, no wheezing, rales, normal symmetric air entry Abdomen- normal findings: bowel sounds normal Extremities- no edema Lymph-no adenopathy palpable Musculoskeletal-inability to move left ankle and foot.  Skin-warm and dry, no hyperpigmentation, vitiligo, or suspicious lesions  Neurological Examination Mental Status: Alert, oriented, thought content appropriate.  Speech fluent without evidence of aphasia.  Able to follow 3 step commands without difficulty. Cranial Nerves: II: Visual fields grossly normal, pupils equal, round, reactive to light and accommodation III,IV, VI: ptosis not present, extra-ocular motions intact bilaterally V,VII: smile symmetric, facial light touch sensation normal bilaterally VIII: hearing normal bilaterally IX,X: uvula rises symmetrically XI: bilateral shoulder shrug XII: midline tongue extension Motor: Right : Upper extremity   5/5    Left:     Upper extremity   5/5  Lower extremity   5/5     Lower extremity   5/5 in hip flexion and extension along with knee extension, 4/5 in left knee flexion, inability to dorsiflex plantarflex invert or evert for left foot Tone and bulk:normal tone throughout; no atrophy noted Sensory: Pinprick and light touch intact throughout, bilaterally in the upper extremities. Grossly intact in the right lower extremity. Sensation is grossly intact in the left thigh and hamstring. She has allodynia  in the L4-5 and S1 distribution-stating even light touch feels as if it is pins and needles and burning. Deep Tendon Reflexes: 2+ and symmetric throughout upper extremities and bilateral knee jerk. 1+ right ankle jerk with no ankle jerk on the left  Plantars: Right: downgoing   Left:  [place] Cerebellar: normal finger-to-nose,  normal heel-to-shin test Gait: Not tested      Lab Results: Basic Metabolic Panel:  Recent Labs Lab 01/22/16 0000 01/23/16 0426 01/24/16 0320 01/25/16 0320  NA 139 136 138  --   K 3.7 4.2 3.6  --   CL 107 104 107  --   CO2 24 21* 23  --   GLUCOSE 124* 168* 152*  --   BUN 15 11 19   --   CREATININE 0.93 0.82 0.78  --   CALCIUM 8.2* 8.1* 8.3*  --   MG 1.6*  --  2.7* 2.7*  PHOS 2.9  --  2.7 2.9    Liver Function Tests:  Recent Labs Lab 01/22/16 0000  AST 20  ALT 16  ALKPHOS 61  BILITOT 0.8  PROT 6.1*  ALBUMIN 3.4*   No results for input(s): LIPASE, AMYLASE in the last 168 hours. No results for input(s): AMMONIA in the last 168 hours.  CBC:  Recent Labs Lab 01/21/16 2158 01/22/16 0000 01/23/16 0426 01/24/16 0320  WBC 3.9* 6.8 8.7 11.1*  NEUTROABS 1.2* 4.5  --   --   HGB 7.0* 13.2 12.3 13.1  HCT 21.6* 40.4 37.1 39.1  MCV 94.3 92.9 92.3 91.1  PLT 99* 198 186 191    Cardiac Enzymes: No results for input(s): CKTOTAL, CKMB, CKMBINDEX, TROPONINI in the last 168 hours.  Lipid Panel: No results for input(s): CHOL, TRIG, HDL, CHOLHDL, VLDL, LDLCALC in the last 168 hours.  CBG:  Recent Labs Lab 01/24/16 1526 01/24/16 2018 01/24/16 2344 01/25/16 0346 01/25/16 0831  GLUCAP 123* 148* 116* 109* 111*    Microbiology: Results for orders placed or performed  during the hospital encounter of 01/21/16  MRSA PCR Screening     Status: None   Collection Time: 01/22/16  1:32 AM  Result Value Ref Range Status   MRSA by PCR NEGATIVE NEGATIVE Final    Comment:        The GeneXpert MRSA Assay (FDA approved for NASAL specimens only), is one component of a comprehensive MRSA colonization surveillance program. It is not intended to diagnose MRSA infection nor to guide or monitor treatment for MRSA infections.     Coagulation Studies: No results for input(s): LABPROT, INR in the last 72 hours.  Imaging: Dg Chest  Port 1 View  Result Date: 01/24/2016 CLINICAL DATA:  Edema. EXAM: PORTABLE CHEST 1 VIEW COMPARISON:  01/21/2016. FINDINGS: Endotracheal tube and NG tube in stable position. Heart size normal. Low lung volumes with mild bibasilar atelectasis and/or infiltrates. Small left pleural effusion. No pneumothorax. Prior cervical spine fusion IMPRESSION: 1. Lines and tubes in stable position. 2. Low lung volumes with mild bibasilar atelectasis and/or infiltrates. Small left pleural effusion. Electronically Signed   By: Marcello Moores  Register   On: 01/24/2016 06:59       Assessment and plan per attending neurologist  Etta Quill PA-C [facility] [phone]  01/25/2016, 11:14 AM   Assessment/Plan: 75 year old female with new onset left lower extremity numbness and weakness in the L4 L5 S1 distribution status post intubation for airway protection secondary to angioedema. Over the last day she has gone from no sensation to now sensing pins and needles in that distribution. She continues to lack the ability to dorsiflex, plantarflex, evert, or invert her left ankle. 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 If not completely back to baseline over period of 2 weeks, patient could be seen as outpatient for EMG nerve conduction study.   Dr. Leonel Ramsay will follow behind and make recommendations.

## 2016-01-25 NOTE — Evaluation (Signed)
Physical Therapy Evaluation Patient Details Name: Megan Hull MRN: [id number] DOB: 1940/07/18 Today's Date: 01/25/2016   History of Present Illness  Pt adm with angioedema requiring intubation after allergic reaction.  Intubated 8/11 and extubated 8/14. Developed numbness and weakness in lt foot and ankle. PMH - ACDF, DM, HTN, arthritis  Clinical Impression  Pt admitted with above diagnosis and presents to PT with functional limitations due to deficits listed below (See PT problem list). Pt needs skilled PT to maximize independence and safety to allow discharge to home with family assist and OPPT.  Neurology expects return of LLE function to be slow. Recommend consult with orthotist for AFO for lt foot to increase independence and safety with mobility.     Follow Up Recommendations Outpatient PT;Supervision for mobility/OOB    Equipment Recommendations  Rolling walker with 5" wheels (Lt AFO)    Recommendations for Other Services       Precautions / Restrictions Precautions Precautions: Fall Restrictions Weight Bearing Restrictions: No      Mobility  Bed Mobility               General bed mobility comments: Pt up in chair  Transfers Overall transfer level: Needs assistance Equipment used: Rolling walker (2 wheeled) Transfers: Sit to/from Stand Sit to Stand: Min assist         General transfer comment: assist to bring hips up and for balance  Ambulation/Gait Ambulation/Gait assistance: Min assist Ambulation Distance (Feet): 50 Feet Assistive device: Rolling walker (2 wheeled) Gait Pattern/deviations: Step-through pattern;Decreased dorsiflexion - left;Steppage Gait velocity: decr Gait velocity interpretation: Below normal speed for age/gender General Gait Details: Pt with hyperextension of lt knee with stance. Difficulty with placement on lt foot with tendency to place lt foot too close to midline. Verbal cues not to step past front of walker.  Stairs            Wheelchair Mobility    Modified Rankin (Stroke Patients Only)       Balance Overall balance assessment: Needs assistance Sitting-balance support: No upper extremity supported;Feet supported Sitting balance-Leahy Scale: Normal     Standing balance support: Bilateral upper extremity supported Standing balance-Leahy Scale: Poor Standing balance comment: support of walker due to LLE weakness                             Pertinent Vitals/Pain Pain Assessment: Faces Faces Pain Scale: Hurts little more Pain Location: LLE Pain Descriptors / Indicators: Pins and needles Pain Intervention(s): Limited activity within patient's tolerance;Monitored during session    Home Living Family/patient expects to be discharged to:: Private residence Living Arrangements: Other relatives (niece) Available Help at Discharge: Family;Available 24 hours/day Type of Home: House Home Access: Level entry     Home Layout: Two level;Laundry or work area in basement;Able to live on main level with bedroom/bathroom Home Equipment: Shower seat Additional Comments: May go to her sister's house which has one step to enter    Prior Function Level of Independence: Independent               Hand Dominance   Dominant Hand: Right    Extremity/Trunk Assessment   Upper Extremity Assessment: Overall WFL for tasks assessed           Lower Extremity Assessment: LLE deficits/detail   LLE Deficits / Details: hip 5/5, knee flex 4/5, knee ext 4+/5, ankle 0/5     Communication   Communication: No difficulties  Cognition Arousal/Alertness: Awake/alert Behavior During Therapy: WFL for tasks assessed/performed Overall Cognitive Status: Within Functional Limits for tasks assessed                      General Comments      Exercises        Assessment/Plan    PT Assessment Patient needs continued PT services  PT Diagnosis Difficulty walking;Abnormality of  gait   PT Problem List Decreased strength;Decreased balance;Decreased mobility;Decreased knowledge of use of DME;Impaired sensation  PT Treatment Interventions DME instruction;Gait training;Stair training;Functional mobility training;Therapeutic activities;Therapeutic exercise;Balance training;Neuromuscular re-education;Patient/family education   PT Goals (Current goals can be found in the Care Plan section) Acute Rehab PT Goals Patient Stated Goal: get better PT Goal Formulation: With patient Time For Goal Achievement: 02/01/16 Potential to Achieve Goals: Good    Frequency Min 3X/week   Barriers to discharge        Co-evaluation               End of Session   Activity Tolerance: Patient tolerated treatment well Patient left: in chair;with call bell/phone within reach (wheelchair for transfer) Nurse Communication: Mobility status         Time: [id number] PT Time Calculation (min) (ACUTE ONLY): 20 min   Charges:   PT Evaluation $PT Eval Moderate Complexity: 1 Procedure     PT G Codes:        Sofya Moustafa 2016/01/27, 3:28 PM [facility] PT [phone]

## 2016-01-25 NOTE — Progress Notes (Signed)
Patient transferred to room in stable condition. Alert and oriented. No c/o pain or discomfort at this time. Oriented to room and call light. Bed alarm on at this time. Will cont to monitor.

## 2016-01-25 NOTE — Care Management Note (Signed)
Case Management Note  Patient Details  Name: Megan Hull MRN: [id number] Date of Birth: 11-03-1940  Subjective/Objective:   Pt admitted post allergic reaction                 Action/Plan:  PTA independent from home - adult niece lives with her.  Pt is now complaining of numbness in Left foot - attending has ordered neuro consult in addition to PT/OT eval.  CM will continue to follow for discharge needs.   Expected Discharge Date:                  Expected Discharge Plan:  [place]  In-House Referral:     Discharge planning Services  CM Consult  Post Acute Care Choice:    Choice offered to:     DME Arranged:    DME Agency:     HH Arranged:    [place] Agency:     Status of Service:  In process, will continue to follow  If discussed at Long Length of Stay Meetings, dates discussed:    Additional Comments:  Maryclare Labrador, RN 01/25/2016, 11:07 AM

## 2016-01-25 NOTE — Progress Notes (Signed)
Rehab Admissions Coordinator Note:  Patient was screened by Cleatrice Burke for appropriateness for an Inpatient Acute Rehab Consult per OT recommendation. PT recommends OP. [employer] may not consider an inpt rehab admission appropriate for this diagnosis.   At this time, we are recommending Inpatient Rehab consult if you would like [place] to pursue an inpt rehab admission.  Cleatrice Burke 01/25/2016, 4:48 PM  I can be reached at [phone].

## 2016-01-25 NOTE — Progress Notes (Signed)
Transferred to [id number] via wheelchair portable monitor on. No changes.

## 2016-01-25 NOTE — Progress Notes (Signed)
eLink Physician-Brief Progress Note Patient Name: Megan Hull DOB: May 04, 1941 MRN: [id number]   Date of Service  01/25/2016  HPI/Events of Note  SVT - Brief run. Now NSR. BP = 145/69. Patient is already on Metoprolol PO which is being held as the patient is NPO.   eICU Interventions  Will order: 1. D/C Metoprolol PO. 2. Metoprolol 2.5 mg IV Q 6 hours. Hold for SBP < 105 or HR < 60. 3. Add Mg++ level to AM BMP.     Intervention Category Major Interventions: Arrhythmia - evaluation and management  Jamilya Sarrazin Eugene 01/25/2016, 11:11 PM

## 2016-01-25 NOTE — Evaluation (Signed)
Occupational Therapy Evaluation Patient Details Name: Megan Hull MRN: [id number] DOB: Jan 21, 1941 Today's Date: 01/25/2016    History of Present Illness Pt adm with angioedema requiring intubation after allergic reaction.  Intubated 8/11 and extubated 8/14. Developed numbness and weakness in lt foot and ankle. PMH - ACDF, DM, HTN, arthritis   Clinical Impression   Pt currently presents with weakness in the LLE resulting in a increased dependency with selfcare tasks.  Mod assist needed for functional toilet transfers without use of an assistive device secondary to the LLE not being able to support her weight.  Pt also still with dyspnea during activity but sats remain 96% or higher on room air.  Feel pt will benefit from acute care OT to further progress ADL function.  Feel she will benefit from CIR level therapy to achieve modified independent level for home.  Will continue to follow.     Follow Up Recommendations  CIR;Supervision/Assistance - 24 hour    Equipment Recommendations  3 in 1 bedside comode;Tub/shower bench       Precautions / Restrictions Precautions Precautions: Fall Restrictions Weight Bearing Restrictions: No      Mobility Bed Mobility Overal bed mobility: Needs Assistance Bed Mobility: Supine to Sit     Supine to sit: Supervision     General bed mobility comments: Pt up in chair  Transfers Overall transfer level: Needs assistance Equipment used: Ambulation equipment used;Rolling walker (2 wheeled) Transfers: Stand Pivot Transfers;Sit to/from Stand Sit to Stand: Min assist Stand pivot transfers: Min assist       General transfer comment: assist to bring hips up and for balance    Balance Overall balance assessment: Needs assistance Sitting-balance support: No upper extremity supported;Feet supported Sitting balance-Leahy Scale: Normal     Standing balance support: No upper extremity supported Standing balance-Leahy Scale: Poor Standing  balance comment: support of walker due to LLE weakness                            ADL Overall ADL's : Needs assistance/impaired Eating/Feeding: Independent   Grooming: Wash/dry hands;Minimal assistance;Standing   Upper Body Bathing: Supervision/ safety;Sitting   Lower Body Bathing: Sit to/from stand;Minimal assistance   Upper Body Dressing : Set up;Sitting   Lower Body Dressing: Minimal assistance;Sit to/from stand   Toilet Transfer: Moderate assistance;Regular Toilet;Grab bars;RW;Ambulation   Toileting- Clothing Manipulation and Hygiene: Moderate assistance;Sit to/from stand       Functional mobility during ADLs: Moderate assistance General ADL Comments: Pt currently mod assist for mobility 3-4 steps without use of an assistive device.  She needed only min assist for ambulation to the bathroom with use of the RW.  Decreased ability to support weight through her LLE with increased hypersensitivity as well.  At times pt hopping on just the RLE until cued to put some weight on the left.       Vision Vision Assessment?: No apparent visual deficits   Perception Perception Perception Tested?: No   Praxis Praxis Praxis tested?: Not tested    Pertinent Vitals/Pain Pain Assessment: Faces Faces Pain Scale: Hurts little more Pain Location: LLE Pain Descriptors / Indicators: Pins and needles Pain Intervention(s): Limited activity within patient's tolerance;Monitored during session     Hand Dominance Right   Extremity/Trunk Assessment Upper Extremity Assessment Upper Extremity Assessment: Overall WFL for tasks assessed   Lower Extremity Assessment Lower Extremity Assessment: Defer to PT evaluation LLE Deficits / Details: hip 5/5, knee flex 4/5, knee  ext 4+/5, ankle 0/5 LLE Sensation: decreased light touch   Cervical / Trunk Assessment Cervical / Trunk Assessment: Normal   Communication Communication Communication: No difficulties   Cognition  Arousal/Alertness: Awake/alert Behavior During Therapy: Flat affect Overall Cognitive Status: Within Functional Limits for tasks assessed       Memory:  (Pt only able to remember 1/3 words after 1 min delay.)                        Home Living Family/patient expects to be discharged to:: Private residence Living Arrangements: Other relatives (niece) Available Help at Discharge: Family;Available 24 hours/day Type of Home: House Home Access: Level entry     Home Layout: Two level;Laundry or work area in basement;Able to live on main level with bedroom/bathroom Alternate Therapist, sports of Steps: Flight   Bathroom Shower/Tub: Risk analyst characteristics: Architectural technologist: Standard Bathroom Accessibility: Yes   Home Equipment: Building services engineer Comments: May go to her sister's house which has one step to enter      Prior Functioning/Environment Level of Independence: Independent             OT Diagnosis: Generalized weakness;Acute pain;Paresis   OT Problem List: Decreased strength;Impaired balance (sitting and/or standing);Decreased knowledge of use of DME or AE;Impaired sensation;Pain   OT Treatment/Interventions: Self-care/ADL training;DME and/or AE instruction;Therapeutic activities;Patient/family education    OT Goals(Current goals can be found in the care plan section) Acute Rehab OT Goals Patient Stated Goal: Get the strength back in her LLE OT Goal Formulation: With patient Time For Goal Achievement: 02/08/16 Potential to Achieve Goals: Good  OT Frequency: Min 2X/week              End of Session Equipment Utilized During Treatment: Gait belt;Rolling walker Nurse Communication: Mobility status  Activity Tolerance: Patient tolerated treatment well Patient left: in bed;with call bell/phone within reach;with bed alarm set   Time: [id number] OT Time Calculation (min): 37 min Charges:  OT General Charges $OT Visit:  1 Procedure OT Evaluation $OT Eval Moderate Complexity: 1 Procedure OT Treatments $Self Care/Home Management : 8-22 mins  Megan Hull OTR/L 01/25/2016, 4:17 PM

## 2016-01-25 NOTE — Progress Notes (Signed)
PULMONARY / CRITICAL CARE MEDICINE   Name: Megan Hull MRN: [id number] DOB: [birth year]    ADMISSION DATE:  01/21/2016   CHIEF COMPLAINT:  Tongue swelling  HISTORY OF PRESENT ILLNESS:   [name] is a 75 y/o woman who presented to the ED with swollen tongue. She had prior episodes of similar presentations, but none this severe. Her family (and she) report some concerns with allergies to various things, such as paprika, fish, and possibly some tree branches. On the evening of admission, she had gone to [place]. Later, she started experiencing tongue swelling. She took benadryl, but her tongue continued to swell. She did not have any rash or flushing. She had previously had a similar incident, but without clear exposure, for which she went to an urgent care center and was treated for benadryl only. In the ED, her tongue continued to swell and she was intubated for airway protection. She does endorse that her sister had similar experiences.    SUBJECTIVE:  Afebrile oob to chair C/o acute loss of sensation & weakness left foot - feels ' numb'  VITAL SIGNS: BP 136/68   Pulse 71   Temp 98.7 F (37.1 C) (Oral)   Resp 19   Ht 5' 4.02" (1.626 m)   Wt 73.4 kg (161 lb 13.1 oz)   SpO2 99%   BMI 27.76 kg/m   HEMODYNAMICS:    VENTILATOR SETTINGS:    INTAKE / OUTPUT: I/O last 3 completed shifts: In: 297.4 [I.V.:157.4; NG/GT:90; IV Piggyback:50] Out: [id number] [Urine:1560; Emesis/NG output:50]  PHYSICAL EXAMINATION: General:  Extubated , oob to cahir Neuro:  Alert, interactive, left foot drop, power 1/5, absent ankle reflex on left, sensation decreased HEENT: Less swollen tongue. No lip involvement. Cardiovascular:  Normal heart sounds Lungs:  Clear lungs Abdomen:  Soft, normal BS Musculoskeletal:  Normal joints Skin:  No visible rashes  LABS:  BMET  Recent Labs Lab 01/22/16 0000 01/23/16 0426 01/24/16 0320  NA 139 136 138  K 3.7 4.2 3.6  CL 107 104 107  CO2  24 21* 23  BUN 15 11 19   CREATININE 0.93 0.82 0.78  GLUCOSE 124* 168* 152*    Electrolytes  Recent Labs Lab 01/22/16 0000 01/23/16 0426 01/24/16 0320 01/25/16 0320  CALCIUM 8.2* 8.1* 8.3*  --   MG 1.6*  --  2.7* 2.7*  PHOS 2.9  --  2.7 2.9    CBC  Recent Labs Lab 01/22/16 0000 01/23/16 0426 01/24/16 0320  WBC 6.8 8.7 11.1*  HGB 13.2 12.3 13.1  HCT 40.4 37.1 39.1  PLT 198 186 191    Coag's No results for input(s): APTT, INR in the last 168 hours.  Sepsis Markers  Recent Labs Lab 01/21/16 2218  LATICACIDVEN <0.30*    ABG No results for input(s): PHART, PCO2ART, PO2ART in the last 168 hours.  Liver Enzymes  Recent Labs Lab 01/22/16 0000  AST 20  ALT 16  ALKPHOS 61  BILITOT 0.8  ALBUMIN 3.4*    Cardiac Enzymes No results for input(s): TROPONINI, PROBNP in the last 168 hours.  Glucose  Recent Labs Lab 01/24/16 1156 01/24/16 1526 01/24/16 2018 01/24/16 2344 01/25/16 0346 01/25/16 0831  GLUCAP 116* 123* 148* 116* 109* 111*    Imaging No results found.   STUDIES:  (none pertinent)  CULTURES: None  ANTIBIOTICS: None  SIGNIFICANT EVENTS: Intubated in ED for airway protection  LINES/TUBES: ETT 7.0 8/11 >> 8/14 PIV x2  DISCUSSION: 75 y/o woman presenting with angioedema ? 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 P:   Dc solumedrol, famotidine. Allergy evaluation as outpt  CARDIOVASCULAR A:  Hypertension P:  Labetalol PRN, hold ARB Increase metoprolol 25 bid    GASTROINTESTINAL A:   Dysphagia -pre existing P:   Swallow eval   NEUROLOGIC A:   Left foot drop ? Peroneal neuropathy H/o c spine surgery 2016 P:  Neuro eval - doubt need MRI here PT/OT consult    FAMILY  - Updates: Updated pt   - Inter-disciplinary family meet or  Palliative Care meeting due by:NA    Kara Mead MD. FCCP. [facility] Pager [phone] If no response call [phone]    01/25/2016, 9:31 AM

## 2016-01-26 ENCOUNTER — Inpatient Hospital Stay (HOSPITAL_COMMUNITY): Payer: Commercial Managed Care - HMO

## 2016-01-26 DIAGNOSIS — G5702 Lesion of sciatic nerve, left lower limb: Secondary | ICD-10-CM

## 2016-01-26 DIAGNOSIS — M1990 Unspecified osteoarthritis, unspecified site: Secondary | ICD-10-CM | POA: Diagnosis present

## 2016-01-26 DIAGNOSIS — M4726 Other spondylosis with radiculopathy, lumbar region: Secondary | ICD-10-CM

## 2016-01-26 DIAGNOSIS — T783XXS Angioneurotic edema, sequela: Secondary | ICD-10-CM

## 2016-01-26 LAB — GLUCOSE, CAPILLARY
GLUCOSE-CAPILLARY: 104 mg/dL — AB (ref 65–99)
GLUCOSE-CAPILLARY: 109 mg/dL — AB (ref 65–99)
GLUCOSE-CAPILLARY: 107 mg/dL — AB (ref 65–99)
GLUCOSE-CAPILLARY: 98 mg/dL (ref 65–99)
Glucose-Capillary: 81 mg/dL (ref 65–99)
Glucose-Capillary: 93 mg/dL (ref 65–99)

## 2016-01-26 LAB — CBC
HEMATOCRIT: 39.3 % (ref 36.0–46.0)
HEMOGLOBIN: 12.7 g/dL (ref 12.0–15.0)
MCH: 30.5 pg (ref 26.0–34.0)
MCHC: 32.3 g/dL (ref 30.0–36.0)
MCV: 94.5 fL (ref 78.0–100.0)
Platelets: 204 10*3/uL (ref 150–400)
RBC: 4.16 MIL/uL (ref 3.87–5.11)
RDW: 14.7 % (ref 11.5–15.5)
WBC: 7.9 10*3/uL (ref 4.0–10.5)

## 2016-01-26 LAB — BASIC METABOLIC PANEL
Anion gap: 10 (ref 5–15)
BUN: 22 mg/dL — AB (ref 6–20)
CHLORIDE: 111 mmol/L (ref 101–111)
CO2: 25 mmol/L (ref 22–32)
Calcium: 8.6 mg/dL — ABNORMAL LOW (ref 8.9–10.3)
Creatinine, Ser: 0.81 mg/dL (ref 0.44–1.00)
GFR calc Af Amer: >60 mL/min (ref >60)
Glucose, Bld: 109 mg/dL — ABNORMAL HIGH (ref 65–99)
POTASSIUM: 3.9 mmol/L (ref 3.5–5.1)
Sodium: 146 mmol/L — ABNORMAL HIGH (ref 135–145)

## 2016-01-26 LAB — MAGNESIUM: Magnesium: 2.5 mg/dL — ABNORMAL HIGH (ref 1.7–2.4)

## 2016-01-26 MED ORDER — SIMVASTATIN 40 MG PO TABS
40.0000 mg | ORAL_TABLET | ORAL | Status: DC
  Administered 2016-01-28: 40 mg via ORAL
  Filled 2016-01-26: qty 1

## 2016-01-26 NOTE — Progress Notes (Signed)
Nutrition Follow-up  DOCUMENTATION CODES:   Not applicable  INTERVENTION:   -RD will follow for diet advancement and supplement as appropriate -If pt remains unable to take PO, recommend:  Initiate Jevity 1.2 @ 20 ml/hr and increase by 10 ml every 4 hours to goal rate of 60 ml/hr.   Tube feeding regimen provides 1728 kcal (100% of needs), 80 grams of protein, and 1166 ml of H2O.    NUTRITION DIAGNOSIS:   Inadequate oral intake related to inability to eat, dysphagia as evidenced by NPO status.  GOAL:   Patient will meet greater than or equal to 90% of their needs  MONITOR:   Diet advancement, Labs, Weight trends, I & O's, Skin  REASON FOR ASSESSMENT:   Ventilator    ASSESSMENT:   75 y/o woman who presented to the ED with swollen tongue. She had prior episodes of similar presentations, but none this severe. Her family (and she) report some concerns with allergies to various things, such as paprika, fish, and possibly some tree branches.  Pt extubated on 01/24/16.   Per SLP notes, pt is experiencing dysphagia post-extubation. BSE on 01/25/16 and 01/26/16 recommend continued NPO status, due to moderate to severe aspiration risk and overt swallowing difficulty.   Pt has not received nutrition during 5 days hospitalization. If pt continues to be unable to take PO's, recommend short term enteral nutrition support.   Labs reviewed: Na: 146, Mg: 2.5, CBGS: 104-109.   Diet Order:  Diet NPO time specified Except for: Ice Chips  Skin:  Reviewed, no issues  Last BM:  PTA  Height:   Ht Readings from Last 1 Encounters:  01/26/16 5\' 5"  (1.651 m)    Weight:   Wt Readings from Last 1 Encounters:  01/26/16 159 lb 12.8 oz (72.5 kg)    Ideal Body Weight:  54.5 kg  BMI:  Body mass index is 26.59 kg/m.  Estimated Nutritional Needs:   Kcal:  1600-1800  Protein:  80-95 grams  Fluid:  1.6-1.8 L  EDUCATION NEEDS:   No education needs identified at this time  Megan Hull  A. Jimmye Norman, RD, LDN, CDE Pager: [phone] After hours Pager: [phone]

## 2016-01-26 NOTE — Progress Notes (Signed)
PROGRESS NOTE    Megan Hull  [id number] DOB: 20-Oct-1940 DOA: 01/21/2016 PCP: Megan Nip, MD   Brief Narrative:  Megan Hull is a 75 year old female with a past medical history of hypertension, who had been on valsartan, admitted to the pulmonary critical care service on 01/21/2016 when she presented with complaints of tongue swelling. Given severity of angioedema she underwent intubation for airway protection. She was treated with Solu-Medrol and Famotidine. He was successfully extubated on 01/24/2016. She reported expansion numbness involving left lower extremity for which neurology was consulted. She had an MRI performed on 01/26/2016 that did not reveal acute CVA. An MR of lumbar spine performed on 01/26/2016 did reveal lumbar spondylosis with multilevel foraminal narrowing greatest from L4-S1 levels  Assessment & Plan:   Principal Problem:   Angioedema Active Problems:   Dysphagia   Essential hypertension   Acute respiratory failure with hypoxia (HCC)   DJD (degenerative joint disease)  1.  Angioedema -Could be related to ARB, as she was taking [place] in the outpatient setting. Other possibilities include hereditary angioedema.  -On admission she underwent rapid sequence intubation and was extubated on 01/24/2016 -Showing clinical improvement, although, speech pathology assessed her today recommending maintaining her on ice chips for now -ARB has been stopped.   2.  Dysphagia.  -He was evaluated by speech pathology today, found to have difficulty with thin and pure consistencies. Speech pathology recommending modified barium study if she continues to have dysphasia.  3.  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 She had an MRI of brain which did not reveal acute CVA.  -Neuro recommended EMG and outpatient setting if symptoms do not improve.   4.  Hypertension -She remains on as needed  labetalol and hydralazine for elevated blood pressures   DVT prophylaxis:  Lovenox  Code Status: Full code  Family Communication:  Disposition Plan: Anticipate discharge next 48-72 hours   Consultants:   Pulmonary critical care medicine    Subjective: She takes Megan Hull will better although continues to have some tongue swelling. She also complains of left lower extremity pain, asking for pain medication.   Objective: Vitals:   01/25/16 2159 01/25/16 2256 01/25/16 2309 01/26/16 0539  BP: (!) 145/64 (!) 145/69 (!) 146/70 (!) 170/77  Pulse: 91 89 88 87  Resp: 18   19  Temp: 100.1 F (37.8 C)   99.1 F (37.3 C)  TempSrc: Oral   Oral  SpO2: 96%   95%  Weight:    72.5 kg (159 lb 12.8 oz)  Height:    5\' 5"  (1.651 m)    Intake/Output Summary (Last 24 hours) at 01/26/16 1404 Last data filed at 01/26/16 1336  Gross per 24 hour  Intake              100 ml  Output             1250 ml  Net            -1150 ml   Filed Weights   01/24/16 0317 01/25/16 0500 01/26/16 0539  Weight: 75.3 kg (166 lb 0.1 oz) 73.4 kg (161 lb 13.1 oz) 72.5 kg (159 lb 12.8 oz)    Examination:  General exam: Appears calm and comfortable,  breathing comfortably  Respiratory system: Clear to auscultation. Respiratory effort normal. Cardiovascular system: S1 & S2 heard, RRR. No JVD, murmurs, rubs, gallops or clicks. No pedal edema. Gastrointestinal system: Abdomen is nondistended, soft and  nontender. No organomegaly or masses felt. Normal bowel sounds heard. Central nervous system: Alert and oriented. No focal neurological deficits. Extremities: Symmetric 5 x 5 power. Skin: No rashes, lesions or ulcers Psychiatry: Judgement and insight appear normal. Mood & affect appropriate.    Data Reviewed: I have personally reviewed following labs and imaging studies  CBC:  Recent Labs Lab 01/21/16 2158 01/22/16 0000 01/23/16 0426 01/24/16 0320 01/26/16 0809  WBC 3.9* 6.8 8.7 11.1* 7.9  NEUTROABS 1.2* 4.5  --    --   --   HGB 7.0* 13.2 12.3 13.1 12.7  HCT 21.6* 40.4 37.1 39.1 39.3  MCV 94.3 92.9 92.3 91.1 94.5  PLT 99* 198 186 191 0000000   Basic Metabolic Panel:  Recent Labs Lab 01/22/16 0000 01/23/16 0426 01/24/16 0320 01/25/16 0320 01/26/16 0809  NA 139 136 138  --  146*  K 3.7 4.2 3.6  --  3.9  CL 107 104 107  --  111  CO2 24 21* 23  --  25  GLUCOSE 124* 168* 152*  --  109*  BUN 15 11 19   --  22*  CREATININE 0.93 0.82 0.78  --  0.81  CALCIUM 8.2* 8.1* 8.3*  --  8.6*  MG 1.6*  --  2.7* 2.7* 2.5*  PHOS 2.9  --  2.7 2.9  --    GFR: Estimated Creatinine Clearance: 59.9 mL/min (by C-G formula based on SCr of 0.81 mg/dL). Liver Function Tests:  Recent Labs Lab 01/22/16 0000  AST 20  ALT 16  ALKPHOS 61  BILITOT 0.8  PROT 6.1*  ALBUMIN 3.4*   No results for input(s): LIPASE, AMYLASE in the last 168 hours. No results for input(s): AMMONIA in the last 168 hours. Coagulation Profile: No results for input(s): INR, PROTIME in the last 168 hours. Cardiac Enzymes: No results for input(s): CKTOTAL, CKMB, CKMBINDEX, TROPONINI in the last 168 hours. BNP (last 3 results) No results for input(s): PROBNP in the last 8760 hours. HbA1C: No results for input(s): HGBA1C in the last 72 hours. CBG:  Recent Labs Lab 01/25/16 1903 01/25/16 2339 01/26/16 0332 01/26/16 0757 01/26/16 1213  GLUCAP 104* 107* 104* 109* 107*   Lipid Profile: No results for input(s): CHOL, HDL, LDLCALC, TRIG, CHOLHDL, LDLDIRECT in the last 72 hours. Thyroid Function Tests: No results for input(s): TSH, T4TOTAL, FREET4, T3FREE, THYROIDAB in the last 72 hours. Anemia Panel: No results for input(s): VITAMINB12, FOLATE, FERRITIN, TIBC, IRON, RETICCTPCT in the last 72 hours. Sepsis Labs:  Recent Labs Lab 01/21/16 2218  LATICACIDVEN <0.30*    Recent Results (from the past 240 hour(s))  MRSA PCR Screening     Status: None   Collection Time: 01/22/16  1:32 AM  Result Value Ref Range Status   MRSA by PCR  NEGATIVE NEGATIVE Final    Comment:        The GeneXpert MRSA Assay (FDA approved for NASAL specimens only), is one component of a comprehensive MRSA colonization surveillance program. It is not intended to diagnose MRSA infection nor to guide or monitor treatment for MRSA infections.          Radiology Studies: Mr Brain 72 Contrast  Result Date: 01/26/2016 CLINICAL DATA:  75 y/o F; bilateral visual disturbance and question of posterior reversible encephalopathy syndrome. EXAM: MRI HEAD WITHOUT CONTRAST TECHNIQUE: Multiplanar, multiecho pulse sequences of the brain and surrounding structures were obtained without intravenous contrast. COMPARISON:  None. FINDINGS: Brain: No diffusion restriction to suggest acute infarct. No abnormal susceptibility  hypointensity to indicate intracranial hemorrhage. No significant T2 FLAIR signal abnormality. No focal mass effect. Extra-axial space: Normal ventricular size. No midline shift. No effacement of basilar cisterns. No extra-axial collection is identified. Proximal intracranial flow voids are maintained. No abnormality of the cervical medullary junction. Other: Mucosal thickening within the left maxillary sinus, anterior ethmoid sinuses, bilateral sphenoid sinuses, and left maxillary sinus mucous retention cyst. Minimal opacification of the left mastoid tip. Orbits are unremarkable. Calvarium is unremarkable. IMPRESSION: No acute intracranial abnormality is identified. No evidence of PRES. Moderate paranasal sinus disease. Electronically Signed   By: Kristine Garbe M.D.   On: 01/26/2016 01:38   Mr Lumbar Spine Wo Contrast  Result Date: 01/26/2016 CLINICAL DATA:  74 y/o F; new weakness and paresthesias of the left lower extremity starting August 14th. EXAM: MRI LUMBAR SPINE WITHOUT CONTRAST TECHNIQUE: Multiplanar, multisequence MR imaging of the lumbar spine was performed. No intravenous contrast was administered. COMPARISON:  Concurrent  sacrum MRI. Lumbar radiographs dated 01/19/2015. FINDINGS: Segmentation:  Standard. Alignment:  Physiologic. Vertebrae: No fracture, evidence of discitis, or bone lesion. S1 focus of T1 and T2 hyperintensity with additional smaller foci within the L1, L2 and L4 vertebral body may represent focal fat or hemangioma. Conus medullaris: Extends to the L1-2 level and appears normal. Paraspinal and other soft tissues: Negative. Disc levels: Multilevel disc desiccation and mild disc space narrowing of the L4-5 and L5-S1. S2 subcentimeter Tarlov cyst. L1-2: No significant disc displacement, foraminal narrowing, or canal stenosis. L2-3: Minimal disc bulge without significant foraminal narrowing or canal stenosis. L3-4: Small disc bulge and mild bilateral facet hypertrophy, right greater than left. Mild right foraminal narrowing. No significant canal stenosis. L4-5: Small disc bulge and left-greater-than-right moderate facet and flavum hypertrophy. Trace bilateral facet effusions. Mild right and moderate left foraminal narrowing. Slight narrowing of the left lateral recess. No significant canal stenosis. L5-S1: Small disc bulge and moderate bilateral facet and flavum hypertrophy. Trace right facet effusion. 8 mm T2 hyperintense structure directed posteriorly from the left facet joint into paraspinal muscles is likely a synovial cyst. Mild right and moderate left foraminal narrowing. No significant canal stenosis. 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 Trace lower lumbar degenerative facet effusions. Electronically Signed   By: Kristine Garbe M.D.   On: 01/26/2016 01:22   Mr Sacrum/si Joints Wo Contrast  Result Date: 01/26/2016 CLINICAL DATA:  75 y/o F; new weakness and paresthesias of the left lower extremity. EXAM: MR SACRUM WITHOUT CONTRAST TECHNIQUE: Multiplanar, multisequence MR imaging was performed. No  intravenous contrast was administered. COMPARISON:  Concurrent lumbar spine MRI. CT of the abdomen and pelvis dated 07/15/2007. FINDINGS: Bones: No bone marrow edema or suspicious osseous lesion. S2 vertebral body 11 mm T1/T2 hyperintense focus is probably a small hemangioma, series 8, image 10. No evidence for fracture. Spinal canal: No significant neural impingement. Bilateral subcentimeter S2 level Tarlov cysts incidentally noted. No abnormal T2 signal of the sacral plexus. Soft tissues: Physiologic fluid within the cul-de-sac. No mass or lymphadenopathy is identified. IMPRESSION: No acute fracture or suspicious osseous lesion of the sacrum. No neural impingement of the sacral plexus or abnormal nerve signal is identified. Electronically Signed   By: Kristine Garbe M.D.   On: 01/26/2016 01:31        Scheduled Meds: . antiseptic oral rinse  7 mL Mouth Rinse BID  . aspirin  162 mg Oral q morning - 10a  . enoxaparin (  LOVENOX) injection  40 mg Subcutaneous Q24H  . furosemide  20 mg Oral Daily  . insulin aspart  0-9 Units Subcutaneous Q4H  . metoprolol  2.5 mg Intravenous Q6H  . [START ON 01/28/2016] simvastatin  40 mg Oral Q72H   Continuous Infusions: . sodium chloride 50 mL/hr at 01/26/16 0940     LOS: 5 days    Time spent: 35 min    Kelvin Cellar, MD [facility] Pager [phone]  If 7PM-7AM, please contact night-coverage [email or web address] Password [email or web address] 01/26/2016, 2:04 PM

## 2016-01-26 NOTE — Progress Notes (Signed)
She reports that she continues to have cobweb like visual symptoms. MRI is negative. If this persists, then she can pursue ophthalmology evaluation as an outpatient.  Her MRI does not reveal any lesions that would clearly be responsible for her symptoms. She has sciatic neuropathy, which I suspect is likely compressive during her ICU stay. She will need to follow-up with neurology for EMG if she does not have improvement after a few weeks.  No further recognitions at this time other than PT, OT. Please call with any further questions or concerns.   Roland Rack, MD [facility] [phone]  If 7pm- 7am, please page neurology on call as listed in [place].

## 2016-01-26 NOTE — Progress Notes (Signed)
Patient's temp lowered to 98.7 and BP to 162/76.  Patient still refusing PRN hydralazine.  Will continue to monitor and notify MD as needed.

## 2016-01-26 NOTE — Progress Notes (Signed)
Pt has progressed well with OT today and now [facility] is recommended. [id number]

## 2016-01-26 NOTE — Progress Notes (Signed)
Occupational Therapy Treatment Patient Details Name: Megan Hull MRN: [id number] DOB: 1940-08-01 Today's Date: 01/26/2016    History of present illness Pt adm with angioedema requiring intubation after allergic reaction.  Intubated 8/11 and extubated 8/14. Developed numbness and weakness in lt foot and ankle. PMH - ACDF, DM, HTN, arthritis   OT comments  Pt functioning at a supervision level for transfers with use of the RW to the toilet and for functional mobility to and from the bathroom.  Approaching modified independent level.  Do not feel pt warrants CIR level therapies at this time. She still exhibits right foot drop and will benefit from orthotist consult for AFO.  MD please place order if you agree.    Follow Up Recommendations  Supervision - Intermittent;Home health OT    Equipment Recommendations  3 in 1 bedside comode;Tub/shower bench       Precautions / Restrictions Precautions Precautions: Fall Precaution Comments: left foot drop Restrictions Weight Bearing Restrictions: No       Mobility Bed Mobility Overal bed mobility: Modified Independent                Transfers   Equipment used: Rolling walker (2 wheeled) Transfers: Sit to/from Stand Sit to Stand: Supervision Stand pivot transfers: Supervision            Balance Overall balance assessment: Needs assistance         Standing balance support: Bilateral upper extremity supported Standing balance-Leahy Scale: Good Standing balance comment: Pt's balance improved to supervision level without UE support and approaching modified independent level with use of the RW for support.                   ADL Overall ADL's : Needs assistance/impaired     Grooming: Wash/dry Dispensing optician: Supervision/safety;Ambulation;RW;Grab bars;Regular Museum/gallery exhibitions officer and Hygiene: Supervision/safety;Sit to/from  stand         General ADL Comments: Pt is currently at supervision level for mobility with use of the RW for support.  Still demonstrates foot drop in the LLE however.                 Cognition   Behavior During Therapy: Flat affect Overall Cognitive Status: Within Functional Limits for tasks assessed                                    Pertinent Vitals/ Pain       Pain Assessment: Faces Faces Pain Scale: Hurts a little bit Pain Location: LLE Pain Descriptors / Indicators: Burning Pain Intervention(s): Limited activity within patient's tolerance         Frequency Min 2X/week     Progress Toward Goals  OT Goals(current goals can now be found in the care plan section)  Progress towards OT goals: Progressing toward goals     Plan Discharge plan needs to be updated       End of Session Equipment Utilized During Treatment: Rolling walker   Activity Tolerance Patient tolerated treatment well   Patient Left in chair;with call bell/phone within reach   Nurse Communication Mobility status        Time: [id number] OT Time Calculation (min): 24 min  Charges: OT General Charges $OT Visit: 1 Procedure OT Treatments $Self Care/Home Management : 23-37  mins  Erla Bacchi OTR/L 01/26/2016, 3:45 PM

## 2016-01-26 NOTE — Progress Notes (Signed)
Pt came back from MRI, CCMD notified.

## 2016-01-26 NOTE — Progress Notes (Signed)
Speech Language Pathology Treatment: Dysphagia  Patient Details Name: Megan Hull MRN: [id number] DOB: [birth year] Today's Date: 01/26/2016 Time: [id number] SLP Time Calculation (min) (ACUTE ONLY): 12 min  Assessment / Plan / Recommendation Clinical Impression  Pt's vocal quality is mildly improved from previous date with increased vocal intensity. She continues to have multiple swallows and immediate throat clearing with thin liquid and pureed consistencies tested. Given her overt difficulty, would recommend that she remain NPO except for ice chips PRN after oral care. Will f/u for additional PO trials to assess for readiness to advance. If pt is still experiencing difficulty, she would benefit from MBS particularly given her h/o esophageal dysphagia and ACDF.   HPI HPI: 75 y/o woman who presented to the ED with swollen tongue. She had prior episodes of similar presentations, but none this severe. She required intubation 8/11-8/14. Pt reportedly wtih difficulty swallowing post-extubation. She shares that she had her esophagus stretched ~5 years ago, but has not had any difficulty swallowing since that time.      SLP Plan  Continue with current plan of care     Recommendations  Diet recommendations: NPO Medication Administration: Via alternative means             Oral Care Recommendations: Oral care QID;Oral care prior to ice chip/H20 Follow up Recommendations:  (tba) Plan: Continue with current plan of care     GO                Germain Osgood 01/26/2016, 10:23 AM  Germain Osgood, M.A. CCC-SLP [phone]

## 2016-01-26 NOTE — Progress Notes (Signed)
Pt had 10 to 12 run of SVT at 2249, VS stable, patient not in distress. MD paged at 2300, new order for metoprolol IV placed. Metoprolol given, pt transported to MRI at 2340 in stable condition A&O x 4.

## 2016-01-26 NOTE — Progress Notes (Signed)
Patient's BP 178/70 and Temp 100F.  Patient refuses PRN hydralazine and tylenol at this time. Patient requests that VS get taken again after her visitors leave and she has rested a little bit.  Will continue to monitor patient and notify MD as needed.

## 2016-01-27 ENCOUNTER — Inpatient Hospital Stay (HOSPITAL_COMMUNITY): Payer: Commercial Managed Care - HMO

## 2016-01-27 ENCOUNTER — Encounter (HOSPITAL_COMMUNITY): Payer: Self-pay

## 2016-01-27 DIAGNOSIS — R1310 Dysphagia, unspecified: Secondary | ICD-10-CM

## 2016-01-27 DIAGNOSIS — I10 Essential (primary) hypertension: Secondary | ICD-10-CM

## 2016-01-27 LAB — BASIC METABOLIC PANEL
ANION GAP: 13 (ref 5–15)
BUN: 17 mg/dL (ref 6–20)
CALCIUM: 8.4 mg/dL — AB (ref 8.9–10.3)
CHLORIDE: 111 mmol/L (ref 101–111)
CO2: 23 mmol/L (ref 22–32)
Creatinine, Ser: 0.83 mg/dL (ref 0.44–1.00)
GFR calc non Af Amer: >60 mL/min (ref >60)
Glucose, Bld: 81 mg/dL (ref 65–99)
POTASSIUM: 3.9 mmol/L (ref 3.5–5.1)
Sodium: 147 mmol/L — ABNORMAL HIGH (ref 135–145)

## 2016-01-27 LAB — GLUCOSE, CAPILLARY
GLUCOSE-CAPILLARY: 115 mg/dL — AB (ref 65–99)
GLUCOSE-CAPILLARY: 129 mg/dL — AB (ref 65–99)
GLUCOSE-CAPILLARY: 143 mg/dL — AB (ref 65–99)
GLUCOSE-CAPILLARY: 88 mg/dL (ref 65–99)
Glucose-Capillary: 87 mg/dL (ref 65–99)
Glucose-Capillary: 94 mg/dL (ref 65–99)

## 2016-01-27 LAB — CBC
HEMATOCRIT: 38.2 % (ref 36.0–46.0)
HEMOGLOBIN: 12.4 g/dL (ref 12.0–15.0)
MCH: 30.9 pg (ref 26.0–34.0)
MCHC: 32.5 g/dL (ref 30.0–36.0)
MCV: 95.3 fL (ref 78.0–100.0)
Platelets: 187 10*3/uL (ref 150–400)
RBC: 4.01 MIL/uL (ref 3.87–5.11)
RDW: 14.4 % (ref 11.5–15.5)
WBC: 7.5 10*3/uL (ref 4.0–10.5)

## 2016-01-27 MED ORDER — ISOSORBIDE MONONITRATE ER 30 MG PO TB24
30.0000 mg | ORAL_TABLET | Freq: Every day | ORAL | Status: DC
  Administered 2016-01-27 – 2016-01-29 (×3): 30 mg via ORAL
  Filled 2016-01-27 (×3): qty 1

## 2016-01-27 MED ORDER — METOPROLOL TARTRATE 25 MG PO TABS
25.0000 mg | ORAL_TABLET | Freq: Two times a day (BID) | ORAL | Status: DC
  Administered 2016-01-27 – 2016-01-29 (×4): 25 mg via ORAL
  Filled 2016-01-27 (×4): qty 1

## 2016-01-27 NOTE — Progress Notes (Signed)
Speech Language Pathology Treatment: Dysphagia  Patient Details Name: Megan Hull MRN: [id number] DOB: [birth year] Today's Date: 01/27/2016 Time: [id number] SLP Time Calculation (min) (ACUTE ONLY): 26 min  Assessment / Plan / Recommendation Clinical Impression  Pt was eager for attempting PO trials. Pt reported a disliking to applesauce due to it being "too thick." Given ice chips and thin liquids, observed pt having immediate and delayed throat clear and cough. Family members report this continuous throat clearing is abnormal for pt. Observed delayed throat clearing when presented pudding. Pt took multiple thin liquid swallows in a row and observed throat clearing and coughing. Recommend MBS to further observe the reason for throat clear and cough.    HPI HPI: 75 y/o woman who presented to the ED with swollen tongue. She had prior episodes of similar presentations, but none this severe. She required intubation 8/11-8/14. Pt reportedly wtih difficulty swallowing post-extubation. She shares that she had her esophagus stretched ~5 years ago, but has not had any difficulty swallowing since that time.      SLP Plan  MBS     Recommendations  Diet recommendations: NPO (ice chips allowed) Medication Administration: Via alternative means             Oral Care Recommendations: Oral care QID;Oral care prior to ice chip/H20 Plan: MBS     GO           Functional Limitations:  (Simultaneous filing. User may not have seen previous data.)   Ezekiel Slocumb, Student SLP  Shela Leff 01/27/2016, 12:11 PM

## 2016-01-27 NOTE — Progress Notes (Signed)
[facility] Progress Note Patient Details:  Megan Hull 06-23-1940 [id number]  Patient ID: Elsworth Soho, female   DOB: 08-May-1941, 75 y.o.   MRN: [id number]   [place] Pink 01/27/2016, 4:41 PMCalled [facility] for Ankle foot orthosis.

## 2016-01-27 NOTE — Progress Notes (Signed)
PROGRESS NOTE    Megan Hull  [id number] DOB: February 12, 1941 DOA: 01/21/2016 PCP: Aretta Nip, MD   Brief Narrative:  [name] is a 75 year old female with a past medical history of hypertension, who had been on valsartan, admitted to the pulmonary critical care service on 01/21/2016 when she presented with complaints of tongue swelling. Given severity of angioedema she underwent intubation for airway protection. She was treated with Solu-Medrol and Famotidine. He was successfully extubated on 01/24/2016. She reported expansion numbness involving left lower extremity for which neurology was consulted. She had an MRI performed on 01/26/2016 that did not reveal acute CVA. An MR of lumbar spine performed on 01/26/2016 did reveal lumbar spondylosis with multilevel foraminal narrowing greatest from L4-S1 levels  Assessment & Plan:   Principal Problem:   Angioedema Active Problems:   Dysphagia   Essential hypertension   Acute respiratory failure with hypoxia (HCC)   DJD (degenerative joint disease)  1.  Angioedema -Could be related to ARB, as she was taking Sartain in the outpatient setting. Other possibilities include hereditary angioedema.  -On admission she underwent rapid sequence intubation and was extubated on 01/24/2016 -Showing clinical improvement, although, speech pathology assessed her today recommending maintaining her on ice chips for now -ARB has been stopped.  -Evaluated by speech pathology   2.  Dysphagia.  -She was evaluated by speech pathology today, found to have difficulty with thin and pure consistencies. Speech pathology recommending modified barium study if she continues to have dysphasia. -On 01/27/2016 she underwent modified barium study which showed mild pharyngeal phase dysphagia -Showing clinical improvement, her diet was advanced  3.  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 She  had an MRI of brain which did not reveal acute CVA.  -Neuro recommended EMG and outpatient setting if symptoms do not improve.   4.  Hypertension -Diet advanced today, will restart oral and a tense of agents. -As mentioned above ARB was stopped due to angioedema -Start Imdur 30 mg by mouth daily and restart metoprolol 25 mg by mouth twice a day, will monitor blood pressures moving forward  DVT prophylaxis:  Lovenox  Code Status: Full code  Family Communication:  Disposition Plan: Anticipate discharge next 24 hours   Consultants:   Pulmonary critical care medicine    Subjective: She states doing much better today, states feeling hungry and wanting to eat  Objective: Vitals:   01/27/16 0500 01/27/16 0520 01/27/16 1320 01/27/16 1627  BP:  (!) 175/66 (!) 155/130 (!) 153/65  Pulse:  66 69   Resp:  18 18   Temp:  98.4 F (36.9 C) 98.1 F (36.7 C)   TempSrc:  Oral Oral   SpO2:  98% 96%   Weight: 72.8 kg (160 lb 7.9 oz)     Height:        Intake/Output Summary (Last 24 hours) at 01/27/16 1654 Last data filed at 01/27/16 1300  Gross per 24 hour  Intake          1443.34 ml  Output              750 ml  Net           693.34 ml   Filed Weights   01/25/16 0500 01/26/16 0539 01/27/16 0500  Weight: 73.4 kg (161 lb 13.1 oz) 72.5 kg (159 lb 12.8 oz) 72.8 kg (160 lb 7.9 oz)    Examination:  General exam: Appears calm and comfortable,  breathing comfortably  Respiratory system: Clear to auscultation. Respiratory effort normal. Cardiovascular system: S1 & S2 heard, RRR. No JVD, murmurs, rubs, gallops or clicks. No pedal edema. Gastrointestinal system: Abdomen is nondistended, soft and nontender. No organomegaly or masses felt. Normal bowel sounds heard. Central nervous system: Alert and oriented. No focal neurological deficits. Extremities: Symmetric 5 x 5 power. Skin: No rashes, lesions or ulcers Psychiatry: Judgement and insight appear normal. Mood & affect appropriate.     Data Reviewed: I have personally reviewed following labs and imaging studies  CBC:  Recent Labs Lab 01/21/16 2158 01/22/16 0000 01/23/16 0426 01/24/16 0320 01/26/16 0809 01/27/16 0527  WBC 3.9* 6.8 8.7 11.1* 7.9 7.5  NEUTROABS 1.2* 4.5  --   --   --   --   HGB 7.0* 13.2 12.3 13.1 12.7 12.4  HCT 21.6* 40.4 37.1 39.1 39.3 38.2  MCV 94.3 92.9 92.3 91.1 94.5 95.3  PLT 99* 198 186 191 204 [id number]   Basic Metabolic Panel:  Recent Labs Lab 01/22/16 0000 01/23/16 0426 01/24/16 0320 01/25/16 0320 01/26/16 0809 01/27/16 0527  NA 139 136 138  --  146* 147*  K 3.7 4.2 3.6  --  3.9 3.9  CL 107 104 107  --  111 111  CO2 24 21* 23  --  25 23  GLUCOSE 124* 168* 152*  --  109* 81  BUN 15 11 19   --  22* 17  CREATININE 0.93 0.82 0.78  --  0.81 0.83  CALCIUM 8.2* 8.1* 8.3*  --  8.6* 8.4*  MG 1.6*  --  2.7* 2.7* 2.5*  --   PHOS 2.9  --  2.7 2.9  --   --    GFR: Estimated Creatinine Clearance: 58.5 mL/min (by C-G formula based on SCr of 0.83 mg/dL). Liver Function Tests:  Recent Labs Lab 01/22/16 0000  AST 20  ALT 16  ALKPHOS 61  BILITOT 0.8  PROT 6.1*  ALBUMIN 3.4*   No results for input(s): LIPASE, AMYLASE in the last 168 hours. No results for input(s): AMMONIA in the last 168 hours. Coagulation Profile: No results for input(s): INR, PROTIME in the last 168 hours. Cardiac Enzymes: No results for input(s): CKTOTAL, CKMB, CKMBINDEX, TROPONINI in the last 168 hours. BNP (last 3 results) No results for input(s): PROBNP in the last 8760 hours. HbA1C: No results for input(s): HGBA1C in the last 72 hours. CBG:  Recent Labs Lab 01/26/16 2040 01/26/16 2356 01/27/16 0404 01/27/16 0855 01/27/16 1205  GLUCAP 93 81 88 87 94   Lipid Profile: No results for input(s): CHOL, HDL, LDLCALC, TRIG, CHOLHDL, LDLDIRECT in the last 72 hours. Thyroid Function Tests: No results for input(s): TSH, T4TOTAL, FREET4, T3FREE, THYROIDAB in the last 72 hours. Anemia Panel: No results for  input(s): VITAMINB12, FOLATE, FERRITIN, TIBC, IRON, RETICCTPCT in the last 72 hours. Sepsis Labs:  Recent Labs Lab 01/21/16 2218  LATICACIDVEN <0.30*    Recent Results (from the past 240 hour(s))  MRSA PCR Screening     Status: None   Collection Time: 01/22/16  1:32 AM  Result Value Ref Range Status   MRSA by PCR NEGATIVE NEGATIVE Final    Comment:        The GeneXpert MRSA Assay (FDA approved for NASAL specimens only), is one component of a comprehensive MRSA colonization surveillance program. It is not intended to diagnose MRSA infection nor to guide or monitor treatment for MRSA infections.          Radiology  Studies: Mr Brain 75 Contrast  Result Date: 01/26/2016 CLINICAL DATA:  75 y/o F; bilateral visual disturbance and question of posterior reversible encephalopathy syndrome. EXAM: MRI HEAD WITHOUT CONTRAST TECHNIQUE: Multiplanar, multiecho pulse sequences of the brain and surrounding structures were obtained without intravenous contrast. COMPARISON:  None. FINDINGS: Brain: No diffusion restriction to suggest acute infarct. No abnormal susceptibility hypointensity to indicate intracranial hemorrhage. No significant T2 FLAIR signal abnormality. No focal mass effect. Extra-axial space: Normal ventricular size. No midline shift. No effacement of basilar cisterns. No extra-axial collection is identified. Proximal intracranial flow voids are maintained. No abnormality of the cervical medullary junction. Other: Mucosal thickening within the left maxillary sinus, anterior ethmoid sinuses, bilateral sphenoid sinuses, and left maxillary sinus mucous retention cyst. Minimal opacification of the left mastoid tip. Orbits are unremarkable. Calvarium is unremarkable. IMPRESSION: No acute intracranial abnormality is identified. No evidence of PRES. Moderate paranasal sinus disease. Electronically Signed   By: Kristine Garbe M.D.   On: 01/26/2016 01:38   Mr Lumbar Spine Wo  Contrast  Result Date: 01/26/2016 CLINICAL DATA:  75 y/o F; new weakness and paresthesias of the left lower extremity starting August 14th. EXAM: MRI LUMBAR SPINE WITHOUT CONTRAST TECHNIQUE: Multiplanar, multisequence MR imaging of the lumbar spine was performed. No intravenous contrast was administered. COMPARISON:  Concurrent sacrum MRI. Lumbar radiographs dated 01/19/2015. FINDINGS: Segmentation:  Standard. Alignment:  Physiologic. Vertebrae: No fracture, evidence of discitis, or bone lesion. S1 focus of T1 and T2 hyperintensity with additional smaller foci within the L1, L2 and L4 vertebral body may represent focal fat or hemangioma. Conus medullaris: Extends to the L1-2 level and appears normal. Paraspinal and other soft tissues: Negative. Disc levels: Multilevel disc desiccation and mild disc space narrowing of the L4-5 and L5-S1. S2 subcentimeter Tarlov cyst. L1-2: No significant disc displacement, foraminal narrowing, or canal stenosis. L2-3: Minimal disc bulge without significant foraminal narrowing or canal stenosis. L3-4: Small disc bulge and mild bilateral facet hypertrophy, right greater than left. Mild right foraminal narrowing. No significant canal stenosis. L4-5: Small disc bulge and left-greater-than-right moderate facet and flavum hypertrophy. Trace bilateral facet effusions. Mild right and moderate left foraminal narrowing. Slight narrowing of the left lateral recess. No significant canal stenosis. L5-S1: Small disc bulge and moderate bilateral facet and flavum hypertrophy. Trace right facet effusion. 8 mm T2 hyperintense structure directed posteriorly from the left facet joint into paraspinal muscles is likely a synovial cyst. Mild right and moderate left foraminal narrowing. No significant canal stenosis. 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 Trace lower lumbar degenerative facet effusions.  Electronically Signed   By: Kristine Garbe M.D.   On: 01/26/2016 01:22   Mr Sacrum/si Joints Wo Contrast  Result Date: 01/26/2016 CLINICAL DATA:  75 y/o F; new weakness and paresthesias of the left lower extremity. EXAM: MR SACRUM WITHOUT CONTRAST TECHNIQUE: Multiplanar, multisequence MR imaging was performed. No intravenous contrast was administered. COMPARISON:  Concurrent lumbar spine MRI. CT of the abdomen and pelvis dated 07/15/2007. FINDINGS: Bones: No bone marrow edema or suspicious osseous lesion. S2 vertebral body 11 mm T1/T2 hyperintense focus is probably a small hemangioma, series 8, image 10. No evidence for fracture. Spinal canal: No significant neural impingement. Bilateral subcentimeter S2 level Tarlov cysts incidentally noted. No abnormal T2 signal of the sacral plexus. Soft tissues: Physiologic fluid within the cul-de-sac. No mass or lymphadenopathy is identified. IMPRESSION: No acute fracture or suspicious osseous lesion of the  sacrum. No neural impingement of the sacral plexus or abnormal nerve signal is identified. Electronically Signed   By: Kristine Garbe M.D.   On: 01/26/2016 01:31   Dg Swallowing Func-speech Pathology  Result Date: 01/27/2016 Objective Swallowing Evaluation: Type of Study: MBS-Modified Barium Swallow Study Patient Details Name: NAOME MCBURNIE MRN: [id number] Date of Birth: 31-Oct-1940 Today's Date: 01/27/2016 Time: SLP Start Time (ACUTE ONLY): 1251-SLP Stop Time (ACUTE ONLY): 1305 SLP Time Calculation (min) (ACUTE ONLY): 14 min Past Medical History: Past Medical History: Diagnosis Date . Arthritis  . Diabetes mellitus without complication (HCC)   borderline . Diarrhea  . Diverticulosis  . Dysrhythmia   controlled with Metoprolol-12-19-13 LOV -Dr. Gwenlyn Found sees yearly . Family history of premature CAD  . GERD (gastroesophageal reflux disease)  . Heart murmur   yrs ago . HLD (hyperlipidemia)  . Hypertension  . Lower extremity edema  . Status post  dilation of esophageal narrowing  . Supraventricular arrhythmia  . Tobacco abuse  Past Surgical History: Past Surgical History: Procedure Laterality Date . ANTERIOR CERVICAL DECOMP/DISCECTOMY FUSION N/A 06/08/2015  Procedure: CERVICAL FIVE-SIX ,CERVICAL SIX-SEVEN ANTERIOR CERVICAL DECOMPRESSION/DISCECTOMY FUSION PLATING BONEGRAFT;  Surgeon: Kristeen Miss, MD;  Location: [facility];  Service: Neurosurgery;  Laterality: N/A; . 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 Adora Fridge) . COLONOSCOPY   . ESOPHAGEAL DILATION  2014 . infected lymph node surgery  80's . NM MYOCAR PERF WALL MOTION  04/2011  bruce myoview; normal pattern of perfusion, EF 80%, low risk scan . TONSILLECTOMY  70's . TRANSTHORACIC ECHOCARDIOGRAM  12/2007  mild conc [facility] HPI: 75 y/o woman who presented to the ED with swollen tongue. She had prior episodes of similar presentations, but none this severe. She required intubation 8/11-8/14. Pt reportedly wtih difficulty swallowing post-extubation. She shares that she had her esophagus stretched ~5 years ago, but has not had any difficulty swallowing since that time. Subjective: pt alert, says she is having trouble getting things to go down since extubation Assessment / Plan / Recommendation CHL IP CLINICAL IMPRESSIONS 01/27/2016 Therapy Diagnosis Mild pharyngeal phase dysphagia Clinical Impression Pt has a mild pharyngeal dysphagia with sensorimotor deficits. Oral phase is within gross functional limits, with mildly long mastication attributed to pt not having dentures in place. Timing of swallow initiation is good, but mildly reduced hyolaryngeal movement and epiglottic deflection leads to incomplete airway closure, allowing trace, shallow amounts of penetration with thin liquids. Slightly deeper penetration occurred with thin liquids while trying to also swallow the barium tablet. Cued throat clearing is effective at clearing penetrates, but spontaneous throat clearing  observed intermittently throughout study was not related to any airway compromise. Recommend that pt initiate regular textures and thin liquids with no straws and intermittent throat clear to facilitate clearance of penetrates.  Impact on safety and function Mild aspiration risk   CHL IP TREATMENT RECOMMENDATION 01/27/2016 Treatment Recommendations Therapy as outlined in treatment plan below   Prognosis 01/27/2016 Prognosis for Safe Diet Advancement Good Barriers to Reach Goals -- Barriers/Prognosis Comment -- CHL IP DIET RECOMMENDATION 01/27/2016 SLP Diet Recommendations Regular solids;Thin liquid Liquid Administration via Cup;No straw Medication Administration Whole meds with puree Compensations Slow rate;Small sips/bites;Clear throat intermittently Postural Changes Remain semi-upright after after feeds/meals (Comment);Seated upright at 90 degrees   CHL IP OTHER RECOMMENDATIONS 01/27/2016 Recommended Consults -- Oral Care Recommendations Oral care BID Other Recommendations --   CHL IP FOLLOW UP RECOMMENDATIONS 01/27/2016 Follow up Recommendations None   CHL IP FREQUENCY  AND DURATION 01/27/2016 Speech Therapy Frequency (ACUTE ONLY) min 2x/week Treatment Duration 2 weeks      CHL IP ORAL PHASE 01/27/2016 Oral Phase WFL Oral - Pudding Teaspoon -- Oral - Pudding Cup -- Oral - Honey Teaspoon -- Oral - Honey Cup -- Oral - Nectar Teaspoon -- Oral - Nectar Cup -- Oral - Nectar Straw -- Oral - Thin Teaspoon -- Oral - Thin Cup -- Oral - Thin Straw -- Oral - Puree -- Oral - Mech Soft -- Oral - Regular -- Oral - Multi-Consistency -- Oral - Pill -- Oral Phase - Comment --  CHL IP PHARYNGEAL PHASE 01/27/2016 Pharyngeal Phase Impaired Pharyngeal- Pudding Teaspoon -- Pharyngeal -- Pharyngeal- Pudding Cup -- Pharyngeal -- Pharyngeal- Honey Teaspoon -- Pharyngeal -- Pharyngeal- Honey Cup -- Pharyngeal -- Pharyngeal- Nectar Teaspoon -- Pharyngeal -- Pharyngeal- Nectar Cup -- Pharyngeal -- Pharyngeal- Nectar Straw -- Pharyngeal --  Pharyngeal- Thin Teaspoon -- Pharyngeal -- Pharyngeal- Thin Cup Reduced epiglottic inversion;Reduced anterior laryngeal mobility;Reduced laryngeal elevation;Reduced airway/laryngeal closure;Penetration/Aspiration during swallow Pharyngeal Material enters airway, remains ABOVE vocal cords and not ejected out Pharyngeal- Thin Straw Reduced epiglottic inversion;Reduced anterior laryngeal mobility;Reduced laryngeal elevation;Reduced airway/laryngeal closure;Penetration/Aspiration during swallow Pharyngeal Material enters airway, remains ABOVE vocal cords then ejected out Pharyngeal- Puree Reduced epiglottic inversion;Reduced anterior laryngeal mobility;Reduced laryngeal elevation;Reduced airway/laryngeal closure Pharyngeal -- Pharyngeal- Mechanical Soft Reduced epiglottic inversion;Reduced anterior laryngeal mobility;Reduced laryngeal elevation;Reduced airway/laryngeal closure Pharyngeal -- Pharyngeal- Regular -- Pharyngeal -- Pharyngeal- Multi-consistency -- Pharyngeal -- Pharyngeal- Pill Reduced epiglottic inversion;Reduced anterior laryngeal mobility;Reduced laryngeal elevation;Reduced airway/laryngeal closure Pharyngeal -- Pharyngeal Comment --  CHL IP CERVICAL ESOPHAGEAL PHASE 01/27/2016 Cervical Esophageal Phase WFL Pudding Teaspoon -- Pudding Cup -- Honey Teaspoon -- Honey Cup -- Nectar Teaspoon -- Nectar Cup -- Nectar Straw -- Thin Teaspoon -- Thin Cup -- Thin Straw -- Puree -- Mechanical Soft -- Regular -- Multi-consistency -- Pill -- Cervical Esophageal Comment -- CHL IP GO 01/27/2016 Functional Assessment Tool Used (None) Functional Limitations (No Data) Swallow Current Status [id number]) (None) Swallow Goal Status [id number]) (None) Swallow Discharge Status [id number]) (None) Motor Speech Current Status [id number]) (None) Motor Speech Goal Status UK:060616) (None) Motor Speech Goal Status SA:[id number]) (None) Spoken Language Comprehension Current Status [id number]) (None) Spoken Language Comprehension Goal Status [id number]) (None) Spoken  Language Comprehension Discharge Status [phone]) (None) Spoken Language Expression Current Status [id number]) (None) Spoken Language Expression Goal Status [id number]) (None) Spoken Language Expression Discharge Status [phone]) (None) Attention Current Status [id number]) (None) Attention Goal Status EY:7266000) (None) Attention Discharge Status [id number]) (None) Memory Current Status YL:3545582) (None) Memory Goal Status [id number]) (None) Memory Discharge Status QC:[id number]) (None) Voice Current Status [id number]) (None) Voice Goal Status [id number]) (None) Voice Discharge Status [id number]) (None) Other Speech-Language Pathology Functional Limitation [phone]) (None) Other Speech-Language Pathology Functional Limitation Goal Status [id number]) (None) Other Speech-Language Pathology Functional Limitation Discharge Status [phone]) (None) Germain Osgood 01/27/2016, 2:04 PM  Germain Osgood, M.A. CCC-SLP [phone]                  Scheduled Meds: . antiseptic oral rinse  7 mL Mouth Rinse BID  . aspirin  162 mg Oral q morning - 10a  . enoxaparin (LOVENOX) injection  40 mg Subcutaneous Q24H  . furosemide  20 mg Oral Daily  . insulin aspart  0-9 Units Subcutaneous Q4H  . metoprolol  2.5 mg Intravenous Q6H  . [START ON 01/28/2016] simvastatin  40 mg Oral Q72H   Continuous Infusions: . sodium chloride 100 mL/hr at 01/27/16 1105     LOS: 6  days    Time spent: 25 min    Kelvin Cellar, MD [facility] Pager [phone]  If 7PM-7AM, please contact night-coverage [email or web address] Password [facility] 01/27/2016, 4:54 PM

## 2016-01-27 NOTE — Care Management Important Message (Signed)
Important Message  Patient Details  Name: Megan Hull MRN: [id number] Date of Birth: April 03, 1941   Medicare Important Message Given:  Yes    Nathen May 01/27/2016, 11:47 AM

## 2016-01-27 NOTE — Progress Notes (Signed)
MBSS complete. Full report located under chart review in imaging section.  Megan Hull, M.A. CCC-SLP [phone]  

## 2016-01-27 NOTE — Progress Notes (Signed)
Physical Therapy Treatment Patient Details Name: Megan Hull MRN: [id number] DOB: [birth year] Today's Date: 01/27/2016    History of Present Illness Pt adm with angioedema requiring intubation after allergic reaction.  Intubated 8/11 and extubated 8/14. Developed numbness and weakness in lt foot and ankle. Neurology consult feels Lt sciatic neuropathy from positioning in ICU; MRI head negative PMH - ACDF, DM, HTN, arthritis    PT Comments    Progressing well. Reports pins and needles feeling and hypersensitivity to left foot. No active plantarflexion or dorsiflexion yet. Educated on desensitization techniques and safer ambulation with Left foot drop. Paged Dr. Coralyn Pear re: need order for Orthotist Consult for Left AFO.   Follow Up Recommendations  Outpatient PT;Supervision for mobility/OOB     Equipment Recommendations  Rolling walker with 5" wheels;Other (comment) (Left AFO (via orthotist consult))    Recommendations for Other Services       Precautions / Restrictions Precautions Precautions: Fall Precaution Comments: left foot drop Restrictions Weight Bearing Restrictions: No    Mobility  Bed Mobility                  Transfers Overall transfer level: Needs assistance Equipment used: Rolling walker (2 wheeled) Transfers: Sit to/from Stand Sit to Stand: Min guard         General transfer comment: vc for safe use of rW and she still tries to use RW to come to stand (risk of imbalance)  Ambulation/Gait Ambulation/Gait assistance: Min guard Ambulation Distance (Feet): 80 Feet (seated rest; 30 ft) Assistive device: Rolling walker (2 wheeled) Gait Pattern/deviations: Step-through pattern;Decreased stride length;Steppage Gait velocity: decr appropriately for safety Gait velocity interpretation: Below normal speed for age/gender General Gait Details: vc for allowing left foot to "lead"; pt with lt foot dragging behind her twice (both times when turning and  LLE fell behind her Rt)   Science writer    Modified Rankin (Stroke Patients Only)       Balance           Standing balance support: No upper extremity supported;During functional activity Standing balance-Leahy Scale: Good Standing balance comment: washing hands at sink with no UE support                    Cognition Arousal/Alertness: Awake/alert Behavior During Therapy: WFL for tasks assessed/performed Overall Cognitive Status: Within Functional Limits for tasks assessed                      Exercises General Exercises - Lower Extremity Ankle Circles/Pumps: PROM;Left;10 reps;Seated (pt performed) Long Arc Quad: Strengthening;Left;5 reps Toe Raises: Other (comment) (attempted with no active contraction in dorsiflexors) Heel Raises: Other (comment) (attempted in sit and stand with no action in plantarflexors)    General Comments        Pertinent Vitals/Pain Pain Assessment: Faces Faces Pain Scale: Hurts little more Pain Location: Lt foot when touched Pain Descriptors / Indicators: Burning;Pins and needles Pain Intervention(s): Limited activity within patient's tolerance;Other (comment) (educated in desensitization activities for Lt foot )    Home Living                      Prior Function            PT Goals (current goals can now be found in the care plan section) Acute Rehab PT Goals Patient Stated Goal: Get the strength back in  her LLE Time For Goal Achievement: 02/01/16 Progress towards PT goals: Progressing toward goals    Frequency  Min 3X/week    PT Plan Current plan remains appropriate    Co-evaluation             End of Session Equipment Utilized During Treatment: Gait belt Activity Tolerance: Patient tolerated treatment well Patient left: in chair;with call bell/phone within reach;with chair alarm set;with family/visitor present     Time: 1355-1426 PT Time Calculation (min)  (ACUTE ONLY): 31 min  Charges:  $Gait Training: 23-37 mins                    G Codes:      Nema Oatley 2016-02-12, 2:42 PM  Pager [phone]

## 2016-01-28 LAB — BASIC METABOLIC PANEL
ANION GAP: 10 (ref 5–15)
BUN: 15 mg/dL (ref 6–20)
CHLORIDE: 106 mmol/L (ref 101–111)
CO2: 28 mmol/L (ref 22–32)
Calcium: 8.4 mg/dL — ABNORMAL LOW (ref 8.9–10.3)
Creatinine, Ser: 0.76 mg/dL (ref 0.44–1.00)
GFR calc non Af Amer: >60 mL/min (ref >60)
Glucose, Bld: 110 mg/dL — ABNORMAL HIGH (ref 65–99)
POTASSIUM: 3.4 mmol/L — AB (ref 3.5–5.1)
Sodium: 144 mmol/L (ref 135–145)

## 2016-01-28 LAB — GLUCOSE, CAPILLARY
GLUCOSE-CAPILLARY: 113 mg/dL — AB (ref 65–99)
GLUCOSE-CAPILLARY: 93 mg/dL (ref 65–99)
GLUCOSE-CAPILLARY: 133 mg/dL — AB (ref 65–99)
Glucose-Capillary: 120 mg/dL — ABNORMAL HIGH (ref 65–99)
Glucose-Capillary: 97 mg/dL (ref 65–99)

## 2016-01-28 LAB — CBC
HCT: 36.4 % (ref 36.0–46.0)
HEMOGLOBIN: 11.9 g/dL — AB (ref 12.0–15.0)
MCH: 30.7 pg (ref 26.0–34.0)
MCHC: 32.7 g/dL (ref 30.0–36.0)
MCV: 94.1 fL (ref 78.0–100.0)
Platelets: 178 10*3/uL (ref 150–400)
RBC: 3.87 MIL/uL (ref 3.87–5.11)
RDW: 13.8 % (ref 11.5–15.5)
WBC: 7.4 10*3/uL (ref 4.0–10.5)

## 2016-01-28 MED ORDER — DIPHENHYDRAMINE HCL 12.5 MG/5ML PO ELIX
12.5000 mg | ORAL_SOLUTION | Freq: Once | ORAL | Status: AC
  Administered 2016-01-28: 12.5 mg via ORAL
  Filled 2016-01-28: qty 5

## 2016-01-28 NOTE — Progress Notes (Signed)
Speech Language Pathology Treatment: Dysphagia  Patient Details Name: Megan Hull MRN: [id number] DOB: [birth year] Today's Date: 01/28/2016 Time: 2263-3354 SLP Time Calculation (min) (ACUTE ONLY): 13 min  Assessment / Plan / Recommendation Clinical Impression  F/u after yesterday's MBS.  Pt very happy to be eating a regular diet with thin liquids.  Excellent toleration; no s/s of aspiration, adequate airway protection.  I with precautions. Angioedema improved but now with left labial edema - its presence does not hinder swallowing.  Goals met; SLP services to sign off.    HPI HPI: 75 y/o woman who presented to the ED with swollen tongue. She had prior episodes of similar presentations, but none this severe. She required intubation 8/11-8/14. Pt reportedly wtih difficulty swallowing post-extubation. She shares that she had her esophagus stretched ~5 years ago, but has not had any difficulty swallowing since that time.      SLP Plan  Discharge SLP treatment due to (comment);All goals met     Recommendations  Diet recommendations: Regular;Thin liquid Liquids provided via: Cup;Straw Medication Administration: Whole meds with liquid Supervision: Patient able to self feed Postural Changes and/or Swallow Maneuvers: Out of bed for meals             Follow up Recommendations: None Plan: Discharge SLP treatment due to (comment);All goals met     GO              Maliik Karner L. Tivis Ringer, [place] CCC/SLP Pager [phone]  Megan Hull 01/28/2016, 10:55 AM

## 2016-01-28 NOTE — Consult Note (Signed)
   [facility] Inpatient Consult   01/28/2016  DEYSI SOLDO 1940/10/18 [id number]  Patient screened for potential [facility] services. Patient is eligible for [facility] Care Management services under patient's [facility]  plan. Pt is a 75 y/o admitted with angioedema requiring intubation after allergic reaction.  Intubated 8/11 and extubated 8/14. Developed numbness and weakness in lt foot and ankle. 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  Met with the patient at the bedside.  Patient states she feels that if her medications and allergic reactions are figured out she will not need a visit as she plans to go to outpatient rehab for several weeks.  She endorses Dr. Harlene Ramus as her primary care provider.  She states she will need a rolling walker as an assisted device prior to discharge as well.  Explained to her that the inpatient RNCM will follow up for equipment needs.  Patient expresses the importance of her follow up appointment with her primary care provider and that she will call if she is having any problems getting an appointment or other needs.  Please place a [facility] Care Management consult or for questions contact:   Natividad Brood, RN BSN [place] Liaison  [phone] business mobile phone Toll free office [phone]

## 2016-01-28 NOTE — Progress Notes (Signed)
PROGRESS NOTE    Megan Hull  [id number] DOB: Nov 16, 1940 DOA: 01/21/2016 PCP: Aretta Nip, MD   Brief Narrative:  Megan Hull is a 75 year old female with a past medical history of hypertension, who had been on valsartan, admitted to the pulmonary critical care service on 01/21/2016 when she presented with complaints of tongue swelling. Given severity of angioedema she underwent intubation for airway protection. She was treated with Solu-Medrol and Famotidine. He was successfully extubated on 01/24/2016. She reported expansion numbness involving left lower extremity for which neurology was consulted. She had an MRI performed on 01/26/2016 that did not reveal acute CVA. An MR of lumbar spine performed on 01/26/2016 did reveal lumbar spondylosis with multilevel foraminal narrowing greatest from L4-S1 levels  Assessment & Plan:   Principal Problem:   Angioedema Active Problems:   Dysphagia   Essential hypertension   Acute respiratory failure with hypoxia (HCC)   DJD (degenerative joint disease)  1.  Angioedema -Could be related to ARB, as she was taking Sartain in the outpatient setting. Other possibilities include hereditary angioedema.  -On admission she underwent rapid sequence intubation and was extubated on 01/24/2016 -Showing clinical improvement, although, speech pathology assessed her today recommending maintaining her on ice chips for now -ARB has been stopped.  -Evaluated by speech pathology her diet was advanced on 01/27/2016 -Patient had been doing well up until 01/28/2016 however on this date started developing lower lip swelling. Unclear where this is coming from. She denied tongue swelling or sensation of throat closing, stated tolerating regular diet well.  2.  Dysphagia.  -She was evaluated by speech pathology today, found to have difficulty with thin and pure consistencies. Speech pathology recommending modified barium study if she continues to have  dysphasia. -On 01/27/2016 she underwent modified barium study which showed mild pharyngeal phase dysphagia -Showing clinical improvement, her diet was advanced  3.  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 She had an MRI of brain which did not reveal acute CVA.  -Neuro recommended EMG and outpatient setting if symptoms do not improve.   4.  Hypertension -Diet advanced today, will restart oral and a tense of agents. -As mentioned above ARB was stopped due to angioedema -Start Imdur 30 mg by mouth daily and restart metoprolol 25 mg by mouth twice a day -On 01/28/2016 blood pressures improving  DVT prophylaxis:  Lovenox  Code Status: Full code  Family Communication:  Disposition Plan: Anticipate discharge next 24 hours   Consultants:   Pulmonary critical care medicine    Subjective: Overall she states feeling much better and is tolerating regular diet, concerned with swelling of lip, denies tongue swelling or difficulties breathing  Objective: Vitals:   01/27/16 2151 01/28/16 0526 01/28/16 0528 01/28/16 1030  BP: (!) 127/57  (!) 157/61 (!) 145/67  Pulse: 68  (!) 58 62  Resp: 18  18   Temp: 99.4 F (37.4 C)  99.2 F (37.3 C)   TempSrc:      SpO2: 97%  98%   Weight:  73 kg (161 lb)    Height:        Intake/Output Summary (Last 24 hours) at 01/28/16 1457 Last data filed at 01/28/16 0607  Gross per 24 hour  Intake              440 ml  Output                0 ml  Net  440 ml   Filed Weights   01/26/16 0539 01/27/16 0500 01/28/16 0526  Weight: 72.5 kg (159 lb 12.8 oz) 72.8 kg (160 lb 7.9 oz) 73 kg (161 lb)    Examination:  General exam: Appears calm and comfortable,  breathing comfortably, There is swelling of her bottom lip over left half Respiratory system: Clear to auscultation. Respiratory effort normal. Cardiovascular system: S1 & S2 heard, RRR. No JVD, murmurs, rubs, gallops or clicks.  No pedal edema. Gastrointestinal system: Abdomen is nondistended, soft and nontender. No organomegaly or masses felt. Normal bowel sounds heard. Central nervous system: Alert and oriented. No focal neurological deficits. Extremities: Symmetric 5 x 5 power. Skin: No rashes, lesions or ulcers Psychiatry: Judgement and insight appear normal. Mood & affect appropriate.    Data Reviewed: I have personally reviewed following labs and imaging studies  CBC:  Recent Labs Lab 01/21/16 2158 01/22/16 0000 01/23/16 0426 01/24/16 0320 01/26/16 0809 01/27/16 0527 01/28/16 0646  WBC 3.9* 6.8 8.7 11.1* 7.9 7.5 7.4  NEUTROABS 1.2* 4.5  --   --   --   --   --   HGB 7.0* 13.2 12.3 13.1 12.7 12.4 11.9*  HCT 21.6* 40.4 37.1 39.1 39.3 38.2 36.4  MCV 94.3 92.9 92.3 91.1 94.5 95.3 94.1  PLT 99* 198 186 191 204 187 0000000   Basic Metabolic Panel:  Recent Labs Lab 01/22/16 0000 01/23/16 0426 01/24/16 0320 01/25/16 0320 01/26/16 0809 01/27/16 0527 01/28/16 0646  NA 139 136 138  --  146* 147* 144  K 3.7 4.2 3.6  --  3.9 3.9 3.4*  CL 107 104 107  --  111 111 106  CO2 24 21* 23  --  25 23 28   GLUCOSE 124* 168* 152*  --  109* 81 110*  BUN 15 11 19   --  22* 17 15  CREATININE 0.93 0.82 0.78  --  0.81 0.83 0.76  CALCIUM 8.2* 8.1* 8.3*  --  8.6* 8.4* 8.4*  MG 1.6*  --  2.7* 2.7* 2.5*  --   --   PHOS 2.9  --  2.7 2.9  --   --   --    GFR: Estimated Creatinine Clearance: 60.8 mL/min (by C-G formula based on SCr of 0.8 mg/dL). Liver Function Tests:  Recent Labs Lab 01/22/16 0000  AST 20  ALT 16  ALKPHOS 61  BILITOT 0.8  PROT 6.1*  ALBUMIN 3.4*   No results for input(s): LIPASE, AMYLASE in the last 168 hours. No results for input(s): AMMONIA in the last 168 hours. Coagulation Profile: No results for input(s): INR, PROTIME in the last 168 hours. Cardiac Enzymes: No results for input(s): CKTOTAL, CKMB, CKMBINDEX, TROPONINI in the last 168 hours. BNP (last 3 results) No results for input(s):  PROBNP in the last 8760 hours. HbA1C: No results for input(s): HGBA1C in the last 72 hours. CBG:  Recent Labs Lab 01/27/16 1935 01/27/16 2357 01/28/16 0401 01/28/16 0736 01/28/16 1222  GLUCAP 143* 115* 113* 93 120*   Lipid Profile: No results for input(s): CHOL, HDL, LDLCALC, TRIG, CHOLHDL, LDLDIRECT in the last 72 hours. Thyroid Function Tests: No results for input(s): TSH, T4TOTAL, FREET4, T3FREE, THYROIDAB in the last 72 hours. Anemia Panel: No results for input(s): VITAMINB12, FOLATE, FERRITIN, TIBC, IRON, RETICCTPCT in the last 72 hours. Sepsis Labs:  Recent Labs Lab 01/21/16 2218  LATICACIDVEN <0.30*    Recent Results (from the past 240 hour(s))  MRSA PCR Screening     Status: None  Collection Time: 01/22/16  1:32 AM  Result Value Ref Range Status   MRSA by PCR NEGATIVE NEGATIVE Final    Comment:        The GeneXpert MRSA Assay (FDA approved for NASAL specimens only), is one component of a comprehensive MRSA colonization surveillance program. It is not intended to diagnose MRSA infection nor to guide or monitor treatment for MRSA infections.          Radiology Studies: Dg Swallowing Func-speech Pathology  Result Date: 01/27/2016 Objective Swallowing Evaluation: Type of Study: MBS-Modified Barium Swallow Study Patient Details Name: ALANNIE PORTELA MRN: [id number] Date of Birth: 07-05-1940 Today's Date: 01/27/2016 Time: SLP Start Time (ACUTE ONLY): 1251-SLP Stop Time (ACUTE ONLY): 1305 SLP Time Calculation (min) (ACUTE ONLY): 14 min Past Medical History: Past Medical History: Diagnosis Date . Arthritis  . Diabetes mellitus without complication (HCC)   borderline . Diarrhea  . Diverticulosis  . Dysrhythmia   controlled with Metoprolol-12-19-13 LOV -Dr. Gwenlyn Found sees yearly . Family history of premature CAD  . GERD (gastroesophageal reflux disease)  . Heart murmur   yrs ago . HLD (hyperlipidemia)  . Hypertension  . Lower extremity edema  . Status post dilation of  esophageal narrowing  . Supraventricular arrhythmia  . Tobacco abuse  Past Surgical History: Past Surgical History: Procedure Laterality Date . ANTERIOR CERVICAL DECOMP/DISCECTOMY FUSION N/A 06/08/2015  Procedure: CERVICAL FIVE-SIX ,CERVICAL SIX-SEVEN ANTERIOR CERVICAL DECOMPRESSION/DISCECTOMY FUSION PLATING BONEGRAFT;  Surgeon: Kristeen Miss, MD;  Location: [facility];  Service: Neurosurgery;  Laterality: N/A; . 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 Adora Fridge) . COLONOSCOPY   . ESOPHAGEAL DILATION  2014 . infected lymph node surgery  80's . NM MYOCAR PERF WALL MOTION  04/2011  bruce myoview; normal pattern of perfusion, EF 80%, low risk scan . TONSILLECTOMY  70's . TRANSTHORACIC ECHOCARDIOGRAM  12/2007  mild conc [facility] HPI: 75 y/o woman who presented to the ED with swollen tongue. She had prior episodes of similar presentations, but none this severe. She required intubation 8/11-8/14. Pt reportedly wtih difficulty swallowing post-extubation. She shares that she had her esophagus stretched ~5 years ago, but has not had any difficulty swallowing since that time. Subjective: pt alert, says she is having trouble getting things to go down since extubation Assessment / Plan / Recommendation CHL IP CLINICAL IMPRESSIONS 01/27/2016 Therapy Diagnosis Mild pharyngeal phase dysphagia Clinical Impression Pt has a mild pharyngeal dysphagia with sensorimotor deficits. Oral phase is within gross functional limits, with mildly long mastication attributed to pt not having dentures in place. Timing of swallow initiation is good, but mildly reduced hyolaryngeal movement and epiglottic deflection leads to incomplete airway closure, allowing trace, shallow amounts of penetration with thin liquids. Slightly deeper penetration occurred with thin liquids while trying to also swallow the barium tablet. Cued throat clearing is effective at clearing penetrates, but spontaneous throat clearing observed  intermittently throughout study was not related to any airway compromise. Recommend that pt initiate regular textures and thin liquids with no straws and intermittent throat clear to facilitate clearance of penetrates.  Impact on safety and function Mild aspiration risk   CHL IP TREATMENT RECOMMENDATION 01/27/2016 Treatment Recommendations Therapy as outlined in treatment plan below   Prognosis 01/27/2016 Prognosis for Safe Diet Advancement Good Barriers to Reach Goals -- Barriers/Prognosis Comment -- CHL IP DIET RECOMMENDATION 01/27/2016 SLP Diet Recommendations Regular solids;Thin liquid Liquid Administration via Cup;No straw Medication Administration Whole meds with puree Compensations Slow rate;Small sips/bites;Clear throat  intermittently Postural Changes Remain semi-upright after after feeds/meals (Comment);Seated upright at 90 degrees   CHL IP OTHER RECOMMENDATIONS 01/27/2016 Recommended Consults -- Oral Care Recommendations Oral care BID Other Recommendations --   CHL IP FOLLOW UP RECOMMENDATIONS 01/27/2016 Follow up Recommendations None   CHL IP FREQUENCY AND DURATION 01/27/2016 Speech Therapy Frequency (ACUTE ONLY) min 2x/week Treatment Duration 2 weeks      CHL IP ORAL PHASE 01/27/2016 Oral Phase WFL Oral - Pudding Teaspoon -- Oral - Pudding Cup -- Oral - Honey Teaspoon -- Oral - Honey Cup -- Oral - Nectar Teaspoon -- Oral - Nectar Cup -- Oral - Nectar Straw -- Oral - Thin Teaspoon -- Oral - Thin Cup -- Oral - Thin Straw -- Oral - Puree -- Oral - Mech Soft -- Oral - Regular -- Oral - Multi-Consistency -- Oral - Pill -- Oral Phase - Comment --  CHL IP PHARYNGEAL PHASE 01/27/2016 Pharyngeal Phase Impaired Pharyngeal- Pudding Teaspoon -- Pharyngeal -- Pharyngeal- Pudding Cup -- Pharyngeal -- Pharyngeal- Honey Teaspoon -- Pharyngeal -- Pharyngeal- Honey Cup -- Pharyngeal -- Pharyngeal- Nectar Teaspoon -- Pharyngeal -- Pharyngeal- Nectar Cup -- Pharyngeal -- Pharyngeal- Nectar Straw -- Pharyngeal -- Pharyngeal- Thin  Teaspoon -- Pharyngeal -- Pharyngeal- Thin Cup Reduced epiglottic inversion;Reduced anterior laryngeal mobility;Reduced laryngeal elevation;Reduced airway/laryngeal closure;Penetration/Aspiration during swallow Pharyngeal Material enters airway, remains ABOVE vocal cords and not ejected out Pharyngeal- Thin Straw Reduced epiglottic inversion;Reduced anterior laryngeal mobility;Reduced laryngeal elevation;Reduced airway/laryngeal closure;Penetration/Aspiration during swallow Pharyngeal Material enters airway, remains ABOVE vocal cords then ejected out Pharyngeal- Puree Reduced epiglottic inversion;Reduced anterior laryngeal mobility;Reduced laryngeal elevation;Reduced airway/laryngeal closure Pharyngeal -- Pharyngeal- Mechanical Soft Reduced epiglottic inversion;Reduced anterior laryngeal mobility;Reduced laryngeal elevation;Reduced airway/laryngeal closure Pharyngeal -- Pharyngeal- Regular -- Pharyngeal -- Pharyngeal- Multi-consistency -- Pharyngeal -- Pharyngeal- Pill Reduced epiglottic inversion;Reduced anterior laryngeal mobility;Reduced laryngeal elevation;Reduced airway/laryngeal closure Pharyngeal -- Pharyngeal Comment --  CHL IP CERVICAL ESOPHAGEAL PHASE 01/27/2016 Cervical Esophageal Phase WFL Pudding Teaspoon -- Pudding Cup -- Honey Teaspoon -- Honey Cup -- Nectar Teaspoon -- Nectar Cup -- Nectar Straw -- Thin Teaspoon -- Thin Cup -- Thin Straw -- Puree -- Mechanical Soft -- Regular -- Multi-consistency -- Pill -- Cervical Esophageal Comment -- CHL IP GO 01/27/2016 Functional Assessment Tool Used (None) Functional Limitations (No Data) Swallow Current Status [id number]) (None) Swallow Goal Status [id number]) (None) Swallow Discharge Status [id number]) (None) Motor Speech Current Status [id number]) (None) Motor Speech Goal Status [id number]) (None) Motor Speech Goal Status [id number]) (None) Spoken Language Comprehension Current Status [id number]) (None) Spoken Language Comprehension Goal Status [id number]) (None) Spoken Language  Comprehension Discharge Status [phone]) (None) Spoken Language Expression Current Status [id number]) (None) Spoken Language Expression Goal Status [id number]) (None) Spoken Language Expression Discharge Status [phone]) (None) Attention Current Status LV:[id number]) (None) Attention Goal Status FV:[id number]) (None) Attention Discharge Status [id number]) (None) Memory Current Status AE:130515) (None) Memory Goal Status GI:463060) (None) Memory Discharge Status [id number]) (None) Voice Current Status [id number]) (None) Voice Goal Status [id number]) (None) Voice Discharge Status [id number]) (None) Other Speech-Language Pathology Functional Limitation [phone]) (None) Other Speech-Language Pathology Functional Limitation Goal Status [id number]) (None) Other Speech-Language Pathology Functional Limitation Discharge Status [phone]) (None) Germain Osgood 01/27/2016, 2:04 PM  Germain Osgood, M.A. CCC-SLP [phone]                  Scheduled Meds: . antiseptic oral rinse  7 mL Mouth Rinse BID  . aspirin  162 mg Oral q morning - 10a  . enoxaparin (LOVENOX) injection  40 mg Subcutaneous Q24H  . furosemide  20  mg Oral Daily  . insulin aspart  0-9 Units Subcutaneous Q4H  . isosorbide mononitrate  30 mg Oral Daily  . metoprolol tartrate  25 mg Oral BID  . simvastatin  40 mg Oral Q72H   Continuous Infusions:     LOS: 7 days    Time spent: 25 min    Kelvin Cellar, MD [facility] Pager [phone]  If 7PM-7AM, please contact night-coverage [email or web address] Password [facility] 01/28/2016, 2:57 PM

## 2016-01-28 NOTE — Progress Notes (Signed)
Nutrition Follow-up  DOCUMENTATION CODES:   Not applicable  INTERVENTION:   -Continue with regular diet and encourage adequate PO intake  NUTRITION DIAGNOSIS:   Inadequate oral intake related to inability to eat, dysphagia as evidenced by NPO status.  Resolved  GOAL:   Patient will meet greater than or equal to 90% of their needs  Met  MONITOR:   PO intake, Labs, Weight trends, Skin, TF tolerance  REASON FOR ASSESSMENT:   Ventilator    ASSESSMENT:   75 y/o woman who presented to the ED with swollen tongue. She had prior episodes of similar presentations, but none this severe. Her family (and she) report some concerns with allergies to various things, such as paprika, fish, and possibly some tree branches.  Pt underwent MBSS on 01/27/16; pt was found to be at mild aspiration risk and was advanced to a regular diet.   Pt sitting in recliner chair at time of visit. She reports her appetite improving and estimates that she consumed approximately 90% of her breakfast. Meal completion 100% per doc flowsheets. She reports good swallow function and no difficulty tolerating current diet textures.   Pt denies any further nutritional needs at this time but expressed appreciation for visit.   Labs reviewed: K: 3.4, CBGS: 93-120.   Diet Order:  Diet regular Room service appropriate? Yes; Fluid consistency: Thin  Skin:  Reviewed, no issues  Last BM:  01/28/16  Height:   Ht Readings from Last 1 Encounters:  01/26/16 '5\' 5"'  (1.651 m)    Weight:   Wt Readings from Last 1 Encounters:  01/28/16 161 lb (73 kg)    Ideal Body Weight:  54.5 kg  BMI:  Body mass index is 26.79 kg/m.  Estimated Nutritional Needs:   Kcal:  1600-1800  Protein:  80-95 grams  Fluid:  1.6-1.8 L  EDUCATION NEEDS:   No education needs identified at this time  Aevah Stansbery A. Jimmye Norman, RD, LDN, CDE Pager: [phone] After hours Pager: [phone]

## 2016-01-28 NOTE — Progress Notes (Signed)
Physical Therapy Treatment Patient Details Name: Megan Hull MRN: [id number] DOB: 1940-10-04 Today's Date: 01/28/2016    History of Present Illness Pt adm with angioedema requiring intubation after allergic reaction.  Intubated 8/11 and extubated 8/14. Developed numbness and weakness in lt foot and ankle. 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 Not able to wear AFO for gait training today as it did not fit in shoe. Continues to require cues for advancement of LLE due to foot drop. Tolerated stair training with Min A for balance/safety. Pt fatigues resulting in dragging of left foot. Will continue to follow.   Follow Up Recommendations  Outpatient PT;Supervision for mobility/OOB     Equipment Recommendations  Rolling walker with 5" wheels    Recommendations for Other Services       Precautions / Restrictions Precautions Precautions: Fall Precaution Comments: left foot drop Required Braces or Orthoses: Other Brace/Splint Other Brace/Splint: AFO delivered to room but does not fit into available shoes.  Restrictions Weight Bearing Restrictions: No    Mobility  Bed Mobility               General bed mobility comments: Pt up in chair  Transfers Overall transfer level: Needs assistance Equipment used: Rolling walker (2 wheeled) Transfers: Sit to/from Stand Sit to Stand: Min guard         General transfer comment: Cues for hand placement upon standing. Stood from chair x2, from toilet x1.  Ambulation/Gait Ambulation/Gait assistance: Min guard Ambulation Distance (Feet): 150 Feet Assistive device: Rolling walker (2 wheeled) Gait Pattern/deviations: Step-through pattern;Steppage;Decreased dorsiflexion - left;Decreased stride length;Narrow base of support Gait velocity: decr appropriately for safety   General Gait  Details: VC's to advance LLE instead of letting it drag- more notable when fatigued. left foot dragging during turns. Using visual cues. Ataxic like pattern LLE with uncontrolled descent.   Stairs Stairs: Yes Stairs assistance: Min assist Stair Management: Step to pattern;One rail Right;Two rails Number of Stairs: 3 General stair comments: Cues for safety/technique and assist due to weakness in LLE.  Wheelchair Mobility    Modified Rankin (Stroke Patients Only)       Balance Overall balance assessment: Needs assistance Sitting-balance support: Feet supported;No upper extremity supported Sitting balance-Leahy Scale: Normal Sitting balance - Comments: Able to donn shoes without difficulty.      Standing balance-Leahy Scale: Good Standing balance comment: Pt reaching outside BoS to wipe down toilet and sink without LOB.                    Cognition                            Exercises      General Comments General comments (skin integrity, edema, etc.): Attempted to donn AFO in shoes however pt's foot is too swollen and shoes are not deep enough. Pt will be getting some tennis shoes with laces to see if the AFO will work.      Pertinent Vitals/Pain Pain Assessment: Faces Faces Pain Scale: Hurts little more Pain Location: left foot when touched Pain Descriptors / Indicators: Burning;Pins and needles Pain Intervention(s): Repositioned;Monitored during session    Home Living  Prior Function            PT Goals (current goals can now be found in the care plan section) Progress towards PT goals: Progressing toward goals    Frequency  Min 3X/week    PT Plan Current plan remains appropriate    Co-evaluation             End of Session Equipment Utilized During Treatment: Gait belt Activity Tolerance: Patient limited by fatigue Patient left: in chair;with call bell/phone within reach     Time: 1107-1140 PT  Time Calculation (min) (ACUTE ONLY): 33 min  Charges:  $Gait Training: 23-37 mins                    G Codes:      Sanaia Jasso A Travious Vanover 01/28/2016, 11:59 AM Wray Kearns, [place], DPT [phone]

## 2016-01-29 LAB — GLUCOSE, CAPILLARY: GLUCOSE-CAPILLARY: 98 mg/dL (ref 65–99)

## 2016-01-29 MED ORDER — METOPROLOL TARTRATE 25 MG PO TABS: 25.0000 mg | ORAL_TABLET | Freq: Two times a day (BID) | ORAL | 1 refills | Status: DC

## 2016-01-29 MED ORDER — ISOSORBIDE MONONITRATE ER 30 MG PO TB24: 30.0000 mg | ORAL_TABLET | Freq: Every day | ORAL | 1 refills | Status: DC

## 2016-01-29 NOTE — Progress Notes (Signed)
Nsg Discharge Note  Admit Date:  01/21/2016 Discharge date: 01/29/2016   Megan Hull to be D/C'd Home per MD order.  AVS completed.  Copy for chart, and copy for patient signed, and dated. Patient/caregiver able to verbalize understanding.  Discharge Medication:   Medication List    STOP taking these medications   potassium chloride SA 20 MEQ tablet Commonly known as:  K-DUR,KLOR-CON   valsartan 160 MG tablet Commonly known as:  DIOVAN     TAKE these medications   aspirin 81 MG tablet Take 162 mg by mouth daily.   cetirizine 10 MG tablet Commonly known as:  ZYRTEC Take 10 mg by mouth daily as needed for allergies.   diphenhydrAMINE 25 MG tablet Commonly known as:  BENADRYL Take 2 tablets (50 mg total) by mouth every 6 (six) hours as needed for allergies. What changed:  when to take this  reasons to take this   EPINEPHrine 0.3 mg/0.3 mL Soaj injection Commonly known as:  EPI-PEN Inject 0.3 mLs (0.3 mg total) into the muscle once as needed (anaphylaxis).   furosemide 20 MG tablet Commonly known as:  LASIX Take 1 tablet (20 mg total) by mouth daily. What changed:  how much to take   isosorbide mononitrate 30 MG 24 hr tablet Commonly known as:  IMDUR Take 1 tablet (30 mg total) by mouth daily.   metoprolol tartrate 25 MG tablet Commonly known as:  LOPRESSOR Take 1 tablet (25 mg total) by mouth 2 (two) times daily. What changed:  See the new instructions.   simvastatin 40 MG tablet Commonly known as:  ZOCOR Take 1 tablet (40 mg total) by mouth daily. What changed:  when to take this       Discharge Assessment: Vitals:   01/28/16 1030 01/28/16 2156  BP: (!) 145/67 126/62  Pulse: 62 67  Resp:  18  Temp:  97.4 F (36.3 C)   Skin clean, dry and intact without evidence of skin break down, no evidence of skin tears noted. IV catheter discontinued intact. Site without signs and symptoms of complications - no redness or edema noted at insertion site,  patient denies c/o pain - only slight tenderness at site.  Dressing with slight pressure applied.  D/c Instructions-Education: Discharge instructions given to patient/family with verbalized understanding. D/c education completed with patient/family including follow up instructions, medication list, d/c activities limitations if indicated, with other d/c instructions as indicated by MD - patient able to verbalize understanding, all questions fully answered. Patient instructed to return to ED, call 911, or call MD for any changes in condition.  Patient escorted via [place], and D/C home via private auto.  Dayle Points, RN 01/29/2016 1:07 PM

## 2016-01-29 NOTE — Progress Notes (Signed)
CM met with pt to discuss home needs.  NO HH services have been recc or ordered.  CM called [facility] DME rep, Reggie to please deliver the rolling walker and 3n1 to room so pt can discharge.  NO other CM needs were communicated.

## 2016-01-29 NOTE — Discharge Summary (Signed)
Physician Discharge Summary  KERIS EBANKS [id number] DOB: January 30, 1941 DOA: 01/21/2016  PCP: Aretta Nip, MD  Admit date: 01/21/2016 Discharge date: 01/29/2016  Time spent: 35 minutes  Recommendations for Outpatient Follow-up:   1. Mrs Blaeser admitted for angioedema likely secondary to ARB, requiring intubation during this hospitalization.  2. Please follow up on blood pressures, Metoprolol was increased to 25 mg PO BID and Imdur 30 mg PO q daily was added with discontinuation of ARB   Discharge Diagnoses:  Principal Problem:   Angioedema Active Problems:   Dysphagia   Essential hypertension   Acute respiratory failure with hypoxia (HCC)   DJD (degenerative joint disease)   Discharge Condition: Stable  Diet recommendation: Heart Healthy  Filed Weights   01/26/16 0539 01/27/16 0500 01/28/16 0526  Weight: 72.5 kg (159 lb 12.8 oz) 72.8 kg (160 lb 7.9 oz) 73 kg (161 lb)    History of present illness:  Ms. Breheny is a 75 y/o woman who presented to the ED with swollen tongue. She had prior episodes of similar presentations, but none this severe. Her family (and she) report some concerns with allergies to various things, such as paprika, fish, and possibly some tree branches. On the evening of admission, she had gone to [place]. Later, she started experiencing tongue swelling. She took benadryl, but her tongue continued to swell. She did not have any rash or flushing. She had previously had a similar incident, but without clear exposure, for which she went to an urgent care center and was treated for benadryl only. In the ED, her tongue continued to swell and she was intubated for airway protection. She does endorse that her sister had similar experiences.  Hospital Course:   Mrs Yuen is a 75 year old female with a past medical history of hypertension, who had been on valsartan, admitted to the pulmonary critical care service on 01/21/2016 when she presented  with complaints of tongue swelling. Given severity of angioedema she underwent intubation for airway protection. She was treated with Solu-Medrol and Famotidine. He was successfully extubated on 01/24/2016. She reported expansion numbness involving left lower extremity for which neurology was consulted. She had an MRI performed on 01/26/2016 that did not reveal acute CVA. An MR of lumbar spine performed on 01/26/2016 did reveal lumbar spondylosis with multilevel foraminal narrowing greatest from L4-S1 levels  1.  Angioedema -Suspect related to ARB, as she was taking Valsartan in the outpatient setting. Other possibilities include hereditary angioedema.  -On admission she underwent rapid sequence intubation and was extubated on 01/24/2016 -Showing clinical improvement, although, speech pathology assessed her today recommending maintaining her on ice chips for now -Evaluated by speech pathology her diet was advanced on 01/27/2016 -Patient had been doing well up until 01/28/2016 however on this date started developing lower lip swelling. Unclear where this is coming from. She denied tongue swelling or sensation of throat closing, stated tolerating regular diet well. -On 01/29/2016 lips swelling resolved, reported feeling much better as was asking to go home.    2.  Dysphagia.  -She was evaluated by speech pathology today, found to have difficulty with thin and pure consistencies. Speech pathology recommending modified barium study if she continues to have dysphasia. -On 01/27/2016 she underwent modified barium study which showed mild pharyngeal phase dysphagia -Showing clinical improvement, her diet was advanced -By 01/29/2016 she was tolerating regular diet.   3.  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 She had an MRI of  brain which did not reveal acute CVA.  -Neuro recommended EMG and outpatient setting if symptoms do not improve.    4.  Hypertension -As mentioned above ARB was stopped due to angioedema -Start Imdur 30 mg by mouth daily and restart metoprolol 25 mg by mouth twice a day -On 01/28/2016 blood pressures improving --Please follow up on blood pressures.   Consultations:  PCCM  Neurology  Discharge Exam: Vitals:   01/28/16 1030 01/28/16 2156  BP: (!) 145/67 126/62  Pulse: 62 67  Resp:  18  Temp:  97.4 F (36.3 C)   General exam: Appears calm and comfortable,  breathing comfortably, ambulating around her room Respiratory system: Clear to auscultation. Respiratory effort normal. Cardiovascular system: S1 & S2 heard, RRR. No JVD, murmurs, rubs, gallops or clicks. No pedal edema. Gastrointestinal system: Abdomen is nondistended, soft and nontender. No organomegaly or masses felt. Normal bowel sounds heard. Central nervous system: Alert and oriented. No focal neurological deficits. Extremities: Symmetric 5 x 5 power. Skin: No rashes, lesions or ulcers Psychiatry: Judgement and insight appear normal. Mood & affect appropriate.     Discharge Instructions   Discharge Instructions    Call MD for:    Complete by:  As directed   Call MD for:  difficulty breathing, headache or visual disturbances    Complete by:  As directed   Call MD for:  extreme fatigue    Complete by:  As directed   Call MD for:  hives    Complete by:  As directed   Call MD for:  persistant dizziness or light-headedness    Complete by:  As directed   Call MD for:  persistant nausea and vomiting    Complete by:  As directed   Call MD for:  redness, tenderness, or signs of infection (pain, swelling, redness, odor or green/yellow discharge around incision site)    Complete by:  As directed   Call MD for:  severe uncontrolled pain    Complete by:  As directed   Call MD for:  temperature >100.4    Complete by:  As directed   Diet - low sodium heart healthy    Complete by:  As directed   Increase activity slowly    Complete by:  As  directed     Current Discharge Medication List    START taking these medications   Details  isosorbide mononitrate (IMDUR) 30 MG 24 hr tablet Take 1 tablet (30 mg total) by mouth daily. Qty: 30 tablet, Refills: 1      CONTINUE these medications which have CHANGED   Details  metoprolol tartrate (LOPRESSOR) 25 MG tablet Take 1 tablet (25 mg total) by mouth 2 (two) times daily. Qty: 60 tablet, Refills: 1      CONTINUE these medications which have NOT CHANGED   Details  aspirin 81 MG tablet Take 162 mg by mouth daily.     cetirizine (ZYRTEC) 10 MG tablet Take 10 mg by mouth daily as needed for allergies.    diphenhydrAMINE (BENADRYL) 25 MG tablet Take 2 tablets (50 mg total) by mouth every 6 (six) hours as needed for allergies. Qty: 30 tablet, Refills: 0    furosemide (LASIX) 20 MG tablet Take 1 tablet (20 mg total) by mouth daily. Qty: 90 tablet, Refills: 3    simvastatin (ZOCOR) 40 MG tablet Take 1 tablet (40 mg total) by mouth daily. Qty: 90 tablet, Refills: 3    EPINEPHrine 0.3 mg/0.3 mL IJ SOAJ injection Inject  0.3 mLs (0.3 mg total) into the muscle once as needed (anaphylaxis). Qty: 2 Device, Refills: 0      STOP taking these medications     potassium chloride SA (K-DUR,KLOR-CON) 20 MEQ tablet      valsartan (DIOVAN) 160 MG tablet        Allergies  Allergen Reactions  . Nexium [Esomeprazole Magnesium] Anaphylaxis    Pt states she was unable to swallow for several hours after taking the nexium  . Other Hives, Shortness Of Breath and Other (See Comments)    Red Fish (Hives) Bojangle's chicken (swelling and shortness of breath) PAPRIKA  . Codeine Other (See Comments)    REACTION: chest pain  . Monosodium Glutamate Other (See Comments)    dizziness  . Shellfish Allergy Hives  . Tape Rash    Coban wrap turned her arm red (after stress test)   Follow-up Information    Aretta Nip, MD Follow up in 1 week(s).   Specialty:  Family Medicine Contact  information: [place] [address] [phone]            The results of significant diagnostics from this hospitalization (including imaging, microbiology, ancillary and laboratory) are listed below for reference.    Significant Diagnostic Studies: Mr Brain 23 Contrast  Result Date: 01/26/2016 CLINICAL DATA:  75 y/o F; bilateral visual disturbance and question of posterior reversible encephalopathy syndrome. EXAM: MRI HEAD WITHOUT CONTRAST TECHNIQUE: Multiplanar, multiecho pulse sequences of the brain and surrounding structures were obtained without intravenous contrast. COMPARISON:  None. FINDINGS: Brain: No diffusion restriction to suggest acute infarct. No abnormal susceptibility hypointensity to indicate intracranial hemorrhage. No significant T2 FLAIR signal abnormality. No focal mass effect. Extra-axial space: Normal ventricular size. No midline shift. No effacement of basilar cisterns. No extra-axial collection is identified. Proximal intracranial flow voids are maintained. No abnormality of the cervical medullary junction. Other: Mucosal thickening within the left maxillary sinus, anterior ethmoid sinuses, bilateral sphenoid sinuses, and left maxillary sinus mucous retention cyst. Minimal opacification of the left mastoid tip. Orbits are unremarkable. Calvarium is unremarkable. IMPRESSION: No acute intracranial abnormality is identified. No evidence of PRES. Moderate paranasal sinus disease. Electronically Signed   By: Kristine Garbe M.D.   On: 01/26/2016 01:38   Mr Lumbar Spine Wo Contrast  Result Date: 01/26/2016 CLINICAL DATA:  75 y/o F; new weakness and paresthesias of the left lower extremity starting August 14th. EXAM: MRI LUMBAR SPINE WITHOUT CONTRAST TECHNIQUE: Multiplanar, multisequence MR imaging of the lumbar spine was performed. No intravenous contrast was administered. COMPARISON:  Concurrent sacrum MRI. Lumbar radiographs dated 01/19/2015.  FINDINGS: Segmentation:  Standard. Alignment:  Physiologic. Vertebrae: No fracture, evidence of discitis, or bone lesion. S1 focus of T1 and T2 hyperintensity with additional smaller foci within the L1, L2 and L4 vertebral body may represent focal fat or hemangioma. Conus medullaris: Extends to the L1-2 level and appears normal. Paraspinal and other soft tissues: Negative. Disc levels: Multilevel disc desiccation and mild disc space narrowing of the L4-5 and L5-S1. S2 subcentimeter Tarlov cyst. L1-2: No significant disc displacement, foraminal narrowing, or canal stenosis. L2-3: Minimal disc bulge without significant foraminal narrowing or canal stenosis. L3-4: Small disc bulge and mild bilateral facet hypertrophy, right greater than left. Mild right foraminal narrowing. No significant canal stenosis. L4-5: Small disc bulge and left-greater-than-right moderate facet and flavum hypertrophy. Trace bilateral facet effusions. Mild right and moderate left foraminal narrowing. Slight narrowing of the left lateral recess. No significant canal stenosis.  L5-S1: Small disc bulge and moderate bilateral facet and flavum hypertrophy. Trace right facet effusion. 8 mm T2 hyperintense structure directed posteriorly from the left facet joint into paraspinal muscles is likely a synovial cyst. Mild right and moderate left foraminal narrowing. No significant canal stenosis. 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 Trace lower lumbar degenerative facet effusions. Electronically Signed   By: Mitzi Hansen M.D.   On: 01/26/2016 01:22   Mr Sacrum/si Joints Wo Contrast  Result Date: 01/26/2016 CLINICAL DATA:  75 y/o F; new weakness and paresthesias of the left lower extremity. EXAM: MR SACRUM WITHOUT CONTRAST TECHNIQUE: Multiplanar, multisequence MR imaging was performed. No intravenous contrast was administered. COMPARISON:   Concurrent lumbar spine MRI. CT of the abdomen and pelvis dated 07/15/2007. FINDINGS: Bones: No bone marrow edema or suspicious osseous lesion. S2 vertebral body 11 mm T1/T2 hyperintense focus is probably a small hemangioma, series 8, image 10. No evidence for fracture. Spinal canal: No significant neural impingement. Bilateral subcentimeter S2 level Tarlov cysts incidentally noted. No abnormal T2 signal of the sacral plexus. Soft tissues: Physiologic fluid within the cul-de-sac. No mass or lymphadenopathy is identified. IMPRESSION: No acute fracture or suspicious osseous lesion of the sacrum. No neural impingement of the sacral plexus or abnormal nerve signal is identified. Electronically Signed   By: Mitzi Hansen M.D.   On: 01/26/2016 01:31   Dg Chest Port 1 View  Result Date: 01/24/2016 CLINICAL DATA:  Edema. EXAM: PORTABLE CHEST 1 VIEW COMPARISON:  01/21/2016. FINDINGS: Endotracheal tube and NG tube in stable position. Heart size normal. Low lung volumes with mild bibasilar atelectasis and/or infiltrates. Small left pleural effusion. No pneumothorax. Prior cervical spine fusion IMPRESSION: 1. Lines and tubes in stable position. 2. Low lung volumes with mild bibasilar atelectasis and/or infiltrates. Small left pleural effusion. Electronically Signed   By: Maisie Fus  Register   On: 01/24/2016 06:59   Dg Chest Portable 1 View  Result Date: 01/21/2016 CLINICAL DATA:  Endotracheal tube placement. EXAM: PORTABLE CHEST 1 VIEW COMPARISON:  Chest radiograph performed 01/14/2009 FINDINGS: The patient's endotracheal tube is seen ending 1 cm above the carina. This could be retracted 1-2 cm. An enteric tube is seen extending below the diaphragm. Mild vascular congestion is noted. No pleural effusion or pneumothorax is seen. The cardiomediastinal silhouette is normal in size. No acute osseous abnormalities are identified. IMPRESSION: 1. Endotracheal tube seen ending 1 cm above the carina. This could be  retracted 1-2 cm. 2. Mild vascular congestion noted.  Lungs remain grossly clear. Electronically Signed   By: Roanna Raider M.D.   On: 01/21/2016 22:19   Dg Swallowing Func-speech Pathology  Result Date: 01/27/2016 Objective Swallowing Evaluation: Type of Study: MBS-Modified Barium Swallow Study Patient Details Name: SHAKEMA SURITA MRN: [id number] Date of Birth: 03/23/1941 Today's Date: 01/27/2016 Time: SLP Start Time (ACUTE ONLY): 1251-SLP Stop Time (ACUTE ONLY): 1305 SLP Time Calculation (min) (ACUTE ONLY): 14 min Past Medical History: Past Medical History: Diagnosis Date . Arthritis  . Diabetes mellitus without complication (HCC)   borderline . Diarrhea  . Diverticulosis  . Dysrhythmia   controlled with Metoprolol-12-19-13 LOV -Dr. Allyson Sabal sees yearly . Family history of premature CAD  . GERD (gastroesophageal reflux disease)  . Heart murmur   yrs ago . HLD (hyperlipidemia)  . Hypertension  . Lower extremity edema  . Status post dilation of esophageal narrowing  . Supraventricular arrhythmia  . Tobacco abuse  Past  Surgical History: Past Surgical History: Procedure Laterality Date . ANTERIOR CERVICAL DECOMP/DISCECTOMY FUSION N/A 06/08/2015  Procedure: CERVICAL FIVE-SIX ,CERVICAL SIX-SEVEN ANTERIOR CERVICAL DECOMPRESSION/DISCECTOMY FUSION PLATING BONEGRAFT;  Surgeon: Kristeen Miss, MD;  Location: [facility];  Service: Neurosurgery;  Laterality: N/A; . 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 Adora Fridge) . COLONOSCOPY   . ESOPHAGEAL DILATION  2014 . infected lymph node surgery  80's . NM MYOCAR PERF WALL MOTION  04/2011  bruce myoview; normal pattern of perfusion, EF 80%, low risk scan . TONSILLECTOMY  70's . TRANSTHORACIC ECHOCARDIOGRAM  12/2007  mild conc [facility] HPI: 75 y/o woman who presented to the ED with swollen tongue. She had prior episodes of similar presentations, but none this severe. She required intubation 8/11-8/14. Pt reportedly wtih difficulty swallowing  post-extubation. She shares that she had her esophagus stretched ~5 years ago, but has not had any difficulty swallowing since that time. Subjective: pt alert, says she is having trouble getting things to go down since extubation Assessment / Plan / Recommendation CHL IP CLINICAL IMPRESSIONS 01/27/2016 Therapy Diagnosis Mild pharyngeal phase dysphagia Clinical Impression Pt has a mild pharyngeal dysphagia with sensorimotor deficits. Oral phase is within gross functional limits, with mildly long mastication attributed to pt not having dentures in place. Timing of swallow initiation is good, but mildly reduced hyolaryngeal movement and epiglottic deflection leads to incomplete airway closure, allowing trace, shallow amounts of penetration with thin liquids. Slightly deeper penetration occurred with thin liquids while trying to also swallow the barium tablet. Cued throat clearing is effective at clearing penetrates, but spontaneous throat clearing observed intermittently throughout study was not related to any airway compromise. Recommend that pt initiate regular textures and thin liquids with no straws and intermittent throat clear to facilitate clearance of penetrates.  Impact on safety and function Mild aspiration risk   CHL IP TREATMENT RECOMMENDATION 01/27/2016 Treatment Recommendations Therapy as outlined in treatment plan below   Prognosis 01/27/2016 Prognosis for Safe Diet Advancement Good Barriers to Reach Goals -- Barriers/Prognosis Comment -- CHL IP DIET RECOMMENDATION 01/27/2016 SLP Diet Recommendations Regular solids;Thin liquid Liquid Administration via Cup;No straw Medication Administration Whole meds with puree Compensations Slow rate;Small sips/bites;Clear throat intermittently Postural Changes Remain semi-upright after after feeds/meals (Comment);Seated upright at 90 degrees   CHL IP OTHER RECOMMENDATIONS 01/27/2016 Recommended Consults -- Oral Care Recommendations Oral care BID Other Recommendations --    CHL IP FOLLOW UP RECOMMENDATIONS 01/27/2016 Follow up Recommendations None   CHL IP FREQUENCY AND DURATION 01/27/2016 Speech Therapy Frequency (ACUTE ONLY) min 2x/week Treatment Duration 2 weeks      CHL IP ORAL PHASE 01/27/2016 Oral Phase WFL Oral - Pudding Teaspoon -- Oral - Pudding Cup -- Oral - Honey Teaspoon -- Oral - Honey Cup -- Oral - Nectar Teaspoon -- Oral - Nectar Cup -- Oral - Nectar Straw -- Oral - Thin Teaspoon -- Oral - Thin Cup -- Oral - Thin Straw -- Oral - Puree -- Oral - Mech Soft -- Oral - Regular -- Oral - Multi-Consistency -- Oral - Pill -- Oral Phase - Comment --  CHL IP PHARYNGEAL PHASE 01/27/2016 Pharyngeal Phase Impaired Pharyngeal- Pudding Teaspoon -- Pharyngeal -- Pharyngeal- Pudding Cup -- Pharyngeal -- Pharyngeal- Honey Teaspoon -- Pharyngeal -- Pharyngeal- Honey Cup -- Pharyngeal -- Pharyngeal- Nectar Teaspoon -- Pharyngeal -- Pharyngeal- Nectar Cup -- Pharyngeal -- Pharyngeal- Nectar Straw -- Pharyngeal -- Pharyngeal- Thin Teaspoon -- Pharyngeal -- Pharyngeal- Thin Cup Reduced epiglottic inversion;Reduced anterior laryngeal mobility;Reduced laryngeal  elevation;Reduced airway/laryngeal closure;Penetration/Aspiration during swallow Pharyngeal Material enters airway, remains ABOVE vocal cords and not ejected out Pharyngeal- Thin Straw Reduced epiglottic inversion;Reduced anterior laryngeal mobility;Reduced laryngeal elevation;Reduced airway/laryngeal closure;Penetration/Aspiration during swallow Pharyngeal Material enters airway, remains ABOVE vocal cords then ejected out Pharyngeal- Puree Reduced epiglottic inversion;Reduced anterior laryngeal mobility;Reduced laryngeal elevation;Reduced airway/laryngeal closure Pharyngeal -- Pharyngeal- Mechanical Soft Reduced epiglottic inversion;Reduced anterior laryngeal mobility;Reduced laryngeal elevation;Reduced airway/laryngeal closure Pharyngeal -- Pharyngeal- Regular -- Pharyngeal -- Pharyngeal- Multi-consistency -- Pharyngeal -- Pharyngeal- Pill  Reduced epiglottic inversion;Reduced anterior laryngeal mobility;Reduced laryngeal elevation;Reduced airway/laryngeal closure Pharyngeal -- Pharyngeal Comment --  CHL IP CERVICAL ESOPHAGEAL PHASE 01/27/2016 Cervical Esophageal Phase WFL Pudding Teaspoon -- Pudding Cup -- Honey Teaspoon -- Honey Cup -- Nectar Teaspoon -- Nectar Cup -- Nectar Straw -- Thin Teaspoon -- Thin Cup -- Thin Straw -- Puree -- Mechanical Soft -- Regular -- Multi-consistency -- Pill -- Cervical Esophageal Comment -- CHL IP GO 01/27/2016 Functional Assessment Tool Used (None) Functional Limitations (No Data) Swallow Current Status [id number]) (None) Swallow Goal Status [id number]) (None) Swallow Discharge Status [id number]) (None) Motor Speech Current Status [id number]) (None) Motor Speech Goal Status [id number]) (None) Motor Speech Goal Status [id number]) (None) Spoken Language Comprehension Current Status [id number]:[id number]) (None) Spoken Language Comprehension Goal Status [id number]) (None) Spoken Language Comprehension Discharge Status [phone]) (None) Spoken Language Expression Current Status [id number]) (None) Spoken Language Expression Goal Status [id number]) (None) Spoken Language Expression Discharge Status [phone]) (None) Attention Current Status LV:671222) (None) Attention Goal Status FV:388293) (None) Attention Discharge Status [id number]) (None) Memory Current Status AE:130515) (None) Memory Goal Status GI:463060) (None) Memory Discharge Status [id number]) (None) Voice Current Status [id number]) (None) Voice Goal Status [id number]) (None) Voice Discharge Status [id number]) (None) Other Speech-Language Pathology Functional Limitation [phone]) (None) Other Speech-Language Pathology Functional Limitation Goal Status [id number]) (None) Other Speech-Language Pathology Functional Limitation Discharge Status [phone]) (None) Germain Osgood 01/27/2016, 2:04 PM  Germain Osgood, M.A. CCC-SLP [phone]              Microbiology: Recent Results (from the past 240 hour(s))  MRSA PCR Screening     Status: None    Collection Time: 01/22/16  1:32 AM  Result Value Ref Range Status   MRSA by PCR NEGATIVE NEGATIVE Final    Comment:        The GeneXpert MRSA Assay (FDA approved for NASAL specimens only), is one component of a comprehensive MRSA colonization surveillance program. It is not intended to diagnose MRSA infection nor to guide or monitor treatment for MRSA infections.      Labs: Basic Metabolic Panel:  Recent Labs Lab 01/23/16 0426 01/24/16 0320 01/25/16 0320 01/26/16 0809 01/27/16 0527 01/28/16 0646  NA 136 138  --  146* 147* 144  K 4.2 3.6  --  3.9 3.9 3.4*  CL 104 107  --  111 111 106  CO2 21* 23  --  25 23 28   GLUCOSE 168* 152*  --  109* 81 110*  BUN 11 19  --  22* 17 15  CREATININE 0.82 0.78  --  0.81 0.83 0.76  CALCIUM 8.1* 8.3*  --  8.6* 8.4* 8.4*  MG  --  2.7* 2.7* 2.5*  --   --   PHOS  --  2.7 2.9  --   --   --    Liver Function Tests: No results for input(s): AST, ALT, ALKPHOS, BILITOT, PROT, ALBUMIN in the last 168 hours. No results for input(s): LIPASE, AMYLASE in the last 168 hours. No results for input(s): AMMONIA in the  last 168 hours. CBC:  Recent Labs Lab 01/23/16 0426 01/24/16 0320 01/26/16 0809 01/27/16 0527 01/28/16 0646  WBC 8.7 11.1* 7.9 7.5 7.4  HGB 12.3 13.1 12.7 12.4 11.9*  HCT 37.1 39.1 39.3 38.2 36.4  MCV 92.3 91.1 94.5 95.3 94.1  PLT 186 191 204 187 178   Cardiac Enzymes: No results for input(s): CKTOTAL, CKMB, CKMBINDEX, TROPONINI in the last 168 hours. BNP: BNP (last 3 results) No results for input(s): BNP in the last 8760 hours.  ProBNP (last 3 results) No results for input(s): PROBNP in the last 8760 hours.  CBG:  Recent Labs Lab 01/28/16 0736 01/28/16 1222 01/28/16 2052 01/28/16 2356 01/29/16 0806  GLUCAP 93 120* 133* 97 98       Signed:  Kelvin Cellar MD.  [facility] 01/29/2016, 9:13 AM

## 2016-02-02 DIAGNOSIS — T783XXD Angioneurotic edema, subsequent encounter: Secondary | ICD-10-CM | POA: Diagnosis not present

## 2016-02-02 DIAGNOSIS — I10 Essential (primary) hypertension: Secondary | ICD-10-CM | POA: Diagnosis not present

## 2016-02-02 DIAGNOSIS — M5432 Sciatica, left side: Secondary | ICD-10-CM | POA: Diagnosis not present

## 2016-02-10 ENCOUNTER — Ambulatory Visit (INDEPENDENT_AMBULATORY_CARE_PROVIDER_SITE_OTHER): Payer: Commercial Managed Care - HMO | Admitting: Neurology

## 2016-02-10 ENCOUNTER — Encounter: Payer: Self-pay | Admitting: Neurology

## 2016-02-10 VITALS — BP 135/79 | HR 76 | Ht 65.0 in | Wt 159.6 lb

## 2016-02-10 DIAGNOSIS — M21372 Foot drop, left foot: Secondary | ICD-10-CM | POA: Diagnosis not present

## 2016-02-10 DIAGNOSIS — G629 Polyneuropathy, unspecified: Secondary | ICD-10-CM

## 2016-02-10 DIAGNOSIS — R29898 Other symptoms and signs involving the musculoskeletal system: Secondary | ICD-10-CM | POA: Diagnosis not present

## 2016-02-10 DIAGNOSIS — G5702 Lesion of sciatic nerve, left lower limb: Secondary | ICD-10-CM | POA: Diagnosis not present

## 2016-02-10 MED ORDER — GABAPENTIN 300 MG PO CAPS: 300.0000 mg | ORAL_CAPSULE | Freq: Three times a day (TID) | ORAL | 11 refills | Status: DC

## 2016-02-10 NOTE — Progress Notes (Signed)
[facility]    Provider:  Dr Jaynee Eagles Referring Provider: Aretta Nip, MD Primary Care Physician:  Aretta Nip, MD  CC:  Left foot weakness and pain  HPI:  Megan Hull is a 75 y.o. female here as a referral from Dr. Radene Ou for left foot drop. past medical history of hypertension and cervical degenerative disc disease, hyperlipidemia, supraventricular tachycardia, asthma, prediabetes, vitamin D deficiency, sciatica left leg.Patient had no issues walking and then 3-4 years ago her leg was numb and it improved and no issues since then more recently earlier this month she was hospitalized and intubated for angioedema and when she was aware she could not feel the left leg when she woke up again she could talk and she could not feel her left leg from the calf down. She could not move the leg like it died. While in the hospital some sensation improved had some tingling. Now she feels like shocks in the leg below the knee, and pain more tightness and can;t stretch it. If she holds it in one position a lot it becomes worse, moving it helps, changing positions, no new pain, she has had foot swelling which is not new, no improvement in strength, can't move the foot or ankle at all, it "just lays there". She is wearing an orthosis, no falls and she could not walk due to foot drop. She has not been to physical therapy. No pain in the thigh. Starts below the knee int he calf. Numbness is on the lateral leg and onto the top of the toes mostly. No back pain, no radicular symptoms. No FHx of neuropathy. No other associated symptoms or focal neurolgic deficits.   Reviewed notes, labs and imaging from outside physicians, which showed:   Reviewed notes from [facility] physicians. Patient has sciatic left leg and left leg foot drop.  Personally reviewed images of the brain and lumbar spine and agree with the following 01/26/2016: No diffusion restriction to suggest acute infarct. No  abnormal susceptibility hypointensity to indicate intracranial hemorrhage. No significant T2 FLAIR signal abnormality. No focal mass effect.  Extra-axial space: Normal ventricular size. No midline shift. No effacement of basilar cisterns. No extra-axial collection is identified. Proximal intracranial flow voids are maintained. No abnormality of the cervical medullary junction.  Other: Mucosal thickening within the left maxillary sinus, anterior ethmoid sinuses, bilateral sphenoid sinuses, and left maxillary sinus mucous retention cyst. Minimal opacification of the left mastoid tip. Orbits are unremarkable. Calvarium is unremarkable.  Conus medullaris: Extends to the L1-2 level and appears normal.  Paraspinal and other soft tissues: Negative.  Disc levels: Multilevel disc desiccation and mild disc space narrowing of the L4-5 and L5-S1. S2 subcentimeter Tarlov cyst.  L1-2: No significant disc displacement, foraminal narrowing, or canal stenosis.  L2-3: Minimal disc bulge without significant foraminal narrowing or canal stenosis.  L3-4: Small disc bulge and mild bilateral facet hypertrophy, right greater than left. Mild right foraminal narrowing. No significant canal stenosis.  L4-5: Small disc bulge and left-greater-than-right moderate facet and flavum hypertrophy. Trace bilateral facet effusions. Mild right and moderate left foraminal narrowing. Slight narrowing of the left lateral recess. No significant canal stenosis.  L5-S1: Small disc bulge and moderate bilateral facet and flavum hypertrophy. Trace right facet effusion. 8 mm T2 hyperintense structure directed posteriorly from the left facet joint into paraspinal muscles is likely a synovial cyst. Mild right and moderate left foraminal narrowing. No significant canal stenosis.  IMPRESSION: Mild-to-moderate lumbar spondylosis with multilevel mild  to moderate foraminal narrowing greatest from the L4 through S1 levels.  Trace lower lumbar degenerative facet effusions.  IMPRESSION: No acute intracranial abnormality is identified. No evidence of PRES. Moderate paranasal sinus disease.  Evaluated by neurology during recent hospitalization. Reviewed all hospitalization notes. She presented to the ED with swollen tongue. Angioedema required intubation. patient was extubated on 8/14.  At that time she expressed she had weakness and numbness of her left LE. Patient was intubated on 8/11. She stated that she noted upon waking that she felt tingling and numbness in her left leg from her knee down. She stated that she was trying to tell the nurses however due to being intubated she could not express herself. She does recall stain on her left side for the majority of the time in ICU. On extubation she was finally able to explain that she could not feel or move her left leg. Initially it was a complete numbness. At this point she states she feels a burning and tingling sensation. In fact she states it is more sensitive on the left than the right. However, she still has no movement in her left foot and ankle. She states she has never had this issue before. She improved in the hospital to no sensation to feeling pins and needles. On exam she was not able to dorsiflex plantarflex even greater invert her left ankle. 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 Recommended EMG nerve conduction study.  CBC with mild anemia hct 11.9, bmp with elevated glucose and calcium normal creatinine.   Review of Systems: Patient complains of symptoms per HPI as well as the following symptoms: Chills, palpitations, swelling in legs, easy bruising, cough, snoring, feeling hot, feeling cold, joint pain, numbness in the leg, weakness. Pertinent negatives per HPI. All others negative.   Social History   Social History  . Marital status: Single    Spouse name: N/A  . Number of children: 0  . Years of education: 12+   Occupational  History  . [employer]    Social History Main Topics  . Smoking status: Current Every Day Smoker    Packs/day: 0.25    Years: 15.00    Types: Cigarettes  . Smokeless tobacco: Never Used  . Alcohol use 12.6 oz/week    7 Shots of liquor, 14 Standard drinks or equivalent per week     Comment: every night scotch  . Drug use: No  . Sexual activity: Not on file   Other Topics Concern  . Not on file   Social History Narrative   Lives with sister   Caffeine use: 2 cups coffee per day (mixed decaf/regular)    Family History  Problem Relation Age of Onset  . Heart disease Brother     MI @ 46, HTN @ 79, DM @ 74   . Heart disease Father     MI  . Heart disease Mother     MI, CVA @ 33  . Heart disease Sister     CHF, CVA  . Esophageal cancer Brother   . Alzheimer's disease Sister   . Heart disease Brother     MI in 75s, lung cancer  . Heart disease Sister   . Hypertension Sister     x2  . Hyperlipidemia Sister     x2  . Diabetes Sister     x2  . Cancer Sister   . Hypertension Brother   . Colon cancer Neg Hx     Past Medical  History:  Diagnosis Date  . Arthritis   . Diabetes mellitus without complication (HCC)    borderline  . Diarrhea   . Diverticulosis   . Dysrhythmia    controlled with Metoprolol-12-19-13 LOV -Dr. Gwenlyn Found sees yearly  . Family history of premature CAD   . GERD (gastroesophageal reflux disease)   . Heart murmur    yrs ago  . HLD (hyperlipidemia)   . Hypertension   . Lower extremity edema   . Status post dilation of esophageal narrowing   . Supraventricular arrhythmia   . Tobacco abuse     Past Surgical History:  Procedure Laterality Date  . ANTERIOR CERVICAL DECOMP/DISCECTOMY FUSION N/A 06/08/2015   Procedure: CERVICAL FIVE-SIX ,CERVICAL SIX-SEVEN ANTERIOR CERVICAL DECOMPRESSION/DISCECTOMY FUSION PLATING BONEGRAFT;  Surgeon: Kristeen Miss, MD;  Location: [facility];  Service: Neurosurgery;  Laterality: N/A;  . 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 Adora Fridge)  . COLONOSCOPY    . ESOPHAGEAL DILATION  2014  . infected lymph node surgery  80's  . NM MYOCAR PERF WALL MOTION  04/2011   bruce myoview; normal pattern of perfusion, EF 80%, low risk scan  . TONSILLECTOMY  70's  . TRANSTHORACIC ECHOCARDIOGRAM  12/2007   mild conc LVH    Current Outpatient Prescriptions  Medication Sig Dispense Refill  . aspirin 81 MG tablet Take 162 mg by mouth daily.     . cetirizine (ZYRTEC) 10 MG tablet Take 10 mg by mouth daily as needed for allergies.    . diphenhydrAMINE (BENADRYL) 25 MG tablet Take 2 tablets (50 mg total) by mouth every 6 (six) hours as needed for allergies. (Patient taking differently: Take 50 mg by mouth once as needed (severe allergic reaction). ) 30 tablet 0  . EPINEPHrine 0.3 mg/0.3 mL IJ SOAJ injection Inject 0.3 mLs (0.3 mg total) into the muscle once as needed (anaphylaxis). 2 Device 0  . furosemide (LASIX) 20 MG tablet Take 1 tablet (20 mg total) by mouth daily. (Patient taking differently: Take 10 mg by mouth daily. ) 90 tablet 3  . isosorbide mononitrate (IMDUR) 30 MG 24 hr tablet Take 1 tablet (30 mg total) by mouth daily. 30 tablet 1  . metoprolol tartrate (LOPRESSOR) 25 MG tablet Take 1 tablet (25 mg total) by mouth 2 (two) times daily. 60 tablet 1  . simvastatin (ZOCOR) 40 MG tablet Take 1 tablet (40 mg total) by mouth daily. (Patient taking differently: Take 40 mg by mouth every 3 (three) days. ) 90 tablet 3  . gabapentin (NEURONTIN) 300 MG capsule Take 1 capsule (300 mg total) by mouth 3 (three) times daily. 90 capsule 11   No current facility-administered medications for this visit.     Allergies as of 02/10/2016 - Review Complete 02/10/2016  Allergen Reaction Noted  . Nexium [esomeprazole magnesium] Anaphylaxis 01/14/2013  . Other Hives, Shortness Of Breath, and Other (See Comments) 08/07/2013  . Codeine Other (See Comments)   . Ace inhibitors Swelling 01/29/2016  .  Monosodium glutamate Other (See Comments) 01/24/2016  . Shellfish allergy Hives 08/07/2013  . Tape Rash 01/21/2016    Vitals: BP 135/79 (BP Location: Right Arm, Patient Position: Sitting, Cuff Size: Normal)   Pulse 76   Ht 5\' 5"  (1.651 m)   Wt 159 lb 9.6 oz (72.4 kg)   BMI 26.56 kg/m  Last Weight:  Wt Readings from Last 1 Encounters:  02/10/16 159 lb 9.6 oz (72.4 kg)   Last  Height:   Ht Readings from Last 1 Encounters:  02/10/16 5\' 5"  (1.651 m)   Physical exam: Exam: Gen: NAD, conversant, well nourised, obese, well groomed                     CV: RRR, no MRG. No Carotid Bruits. +peripheral edema left > right, warm, nontender Eyes: Conjunctivae clear without exudates or hemorrhage  Neuro: Detailed Neurologic Exam  Speech:    Speech is normal; fluent and spontaneous with normal comprehension.  Cognition:    The patient is oriented to person, place, and time;     recent and remote memory intact;     language fluent;     normal attention, concentration,     fund of knowledge Cranial Nerves:    The pupils are equal, round, and reactive to light. The fundi are flat. Visual fields are full to finger confrontation. Extraocular movements are intact. Trigeminal sensation is intact and the muscles of mastication are normal. The face is symmetric. The palate elevates in the midline. Hearing intact. Voice is normal. Shoulder shrug is normal. The tongue has normal motion without fasciculations.   Coordination:    No dysmetria notes  Gait:    Steppage gait  Motor Observation:    No asymmetry, no atrophy, and no involuntary movements noted. Tone:    Decreased left lower leg  Posture:    Posture is normal. normal erect    Strength: 4/5 bilateral hip flexion with the left being slightly weaker. Weakness of left leg flexion 3+/5, intact leg extension 5/5, Weakness left DF/PFeversion and inversion 0/5, intact uppers and lower right leg.        Sensation: Left lateral lower leg  and dorsal left foot with hyperesthesias and allodynia.      Reflex Exam:  DTR's: Bilaterally absent AJs. Otherwise deep tendon reflexes in the upper and lower extremities are normal bilaterally.   Toes:    The toes are downgoing bilaterally.   Clonus:    Clonus is absent.       Assessment/Plan:  75 year old with left leg weakness of the sciatic muscles acute onset while hospitalized in the ICU likely sciatic nerve compression or stretch injury.  Emg/ncs of the bilateral lower extremities for left foot drop possibly sciatic or peroneal compression. Neurontin, discussed side effects as per patient instructions After emg/ncs may consider imaging of the sciatic nerve pending results and localization of findings Physical therapy   Sarina Ill, MD  [facility] [address] [place], [place] [address]  Phone [phone] Fax [phone]

## 2016-02-10 NOTE — Patient Instructions (Addendum)
Remember to drink plenty of fluid, eat healthy meals and do not skip any meals. Try to eat protein with a every meal and eat a healthy snack such as fruit or nuts in between meals. Try to keep a regular sleep-wake schedule and try to exercise daily, particularly in the form of walking, 20-30 minutes a day, if you can.   As far as your medications are concerned, I would like to suggest  As far as diagnostic testing: emg/ncs  I would like to see you back for emg/ncs, sooner if we need to. Please call us with any interim questions, concerns, problems, updates or refill requests.   Our phone number is [phone]. We also have an after hours call service for urgent matters and there is a physician on-call for urgent questions. For any emergencies you know to call 911 or go to the nearest emergency room  Gabapentin capsules or tablets What is this medicine? GABAPENTIN (GA ba pen tin) is used to control partial seizures in adults with epilepsy. It is also used to treat certain types of nerve pain. This medicine may be used for other purposes; ask your health care provider or pharmacist if you have questions. What should I tell my health care provider before I take this medicine? They need to know if you have any of these conditions: -kidney disease -suicidal thoughts, plans, or attempt; a previous suicide attempt by you or a family member -an unusual or allergic reaction to gabapentin, other medicines, foods, dyes, or preservatives -pregnant or trying to get pregnant -breast-feeding How should I use this medicine? Take this medicine by mouth with a glass of water. Follow the directions on the prescription label. You can take it with or without food. If it upsets your stomach, take it with food.Take your medicine at regular intervals. Do not take it more often than directed. Do not stop taking except on your doctor's advice. If you are directed to break the 600 or 800 mg tablets in half as part of  your dose, the extra half tablet should be used for the next dose. If you have not used the extra half tablet within 28 days, it should be thrown away. A special MedGuide will be given to you by the pharmacist with each prescription and refill. Be sure to read this information carefully each time. Talk to your pediatrician regarding the use of this medicine in children. Special care may be needed. Overdosage: If you think you have taken too much of this medicine contact a poison control center or emergency room at once. NOTE: This medicine is only for you. Do not share this medicine with others. What if I miss a dose? If you miss a dose, take it as soon as you can. If it is almost time for your next dose, take only that dose. Do not take double or extra doses. What may interact with this medicine? Do not take this medicine with any of the following medications: -other gabapentin products This medicine may also interact with the following medications: -alcohol -antacids -antihistamines for allergy, cough and cold -certain medicines for anxiety or sleep -certain medicines for depression or psychotic disturbances -homatropine; hydrocodone -naproxen -narcotic medicines (opiates) for pain -phenothiazines like chlorpromazine, mesoridazine, prochlorperazine, thioridazine This list may not describe all possible interactions. Give your health care provider a list of all the medicines, herbs, non-prescription drugs, or dietary supplements you use. Also tell them if you smoke, drink alcohol, or use illegal drugs. Some items may interact  with your medicine. What should I watch for while using this medicine? Visit your doctor or health care professional for regular checks on your progress. You may want to keep a record at home of how you feel your condition is responding to treatment. You may want to share this information with your doctor or health care professional at each visit. You should contact your  doctor or health care professional if your seizures get worse or if you have any new types of seizures. Do not stop taking this medicine or any of your seizure medicines unless instructed by your doctor or health care professional. Stopping your medicine suddenly can increase your seizures or their severity. Wear a medical identification bracelet or chain if you are taking this medicine for seizures, and carry a card that lists all your medications. You may get drowsy, dizzy, or have blurred vision. Do not drive, use machinery, or do anything that needs mental alertness until you know how this medicine affects you. To reduce dizzy or fainting spells, do not sit or stand up quickly, especially if you are an older patient. Alcohol can increase drowsiness and dizziness. Avoid alcoholic drinks. Your mouth may get dry. Chewing sugarless gum or sucking hard candy, and drinking plenty of water will help. The use of this medicine may increase the chance of suicidal thoughts or actions. Pay special attention to how you are responding while on this medicine. Any worsening of mood, or thoughts of suicide or dying should be reported to your health care professional right away. Women who become pregnant while using this medicine may enroll in the [place] Pregnancy Registry by calling [phone]. This registry collects information about the safety of antiepileptic drug use during pregnancy. What side effects may I notice from receiving this medicine? Side effects that you should report to your doctor or health care professional as soon as possible: -allergic reactions like skin rash, itching or hives, swelling of the face, lips, or tongue -worsening of mood, thoughts or actions of suicide or dying Side effects that usually do not require medical attention (report to your doctor or health care professional if they continue or are bothersome): -constipation -difficulty walking or  controlling muscle movements -dizziness -nausea -slurred speech -tiredness -tremors -weight gain This list may not describe all possible side effects. Call your doctor for medical advice about side effects. You may report side effects to FDA at 1-800-FDA-1088. Where should I keep my medicine? Keep out of reach of children. This medicine may cause accidental overdose and death if it taken by other adults, children, or pets. Mix any unused medicine with a substance like cat litter or coffee grounds. Then throw the medicine away in a sealed container like a sealed bag or a coffee can with a lid. Do not use the medicine after the expiration date. Store at room temperature between 15 and 30 degrees C (59 and 86 degrees F). NOTE: This sheet is a summary. It may not cover all possible information. If you have questions about this medicine, talk to your doctor, pharmacist, or health care provider.    2016, Elsevier/Gold Standard. (2013-07-25 15:26:50)

## 2016-02-24 ENCOUNTER — Ambulatory Visit (INDEPENDENT_AMBULATORY_CARE_PROVIDER_SITE_OTHER): Payer: Commercial Managed Care - HMO | Admitting: Neurology

## 2016-02-24 ENCOUNTER — Ambulatory Visit (INDEPENDENT_AMBULATORY_CARE_PROVIDER_SITE_OTHER): Payer: Self-pay | Admitting: Neurology

## 2016-02-24 DIAGNOSIS — Z0289 Encounter for other administrative examinations: Secondary | ICD-10-CM

## 2016-02-24 DIAGNOSIS — M21372 Foot drop, left foot: Secondary | ICD-10-CM

## 2016-02-24 DIAGNOSIS — G5702 Lesion of sciatic nerve, left lower limb: Secondary | ICD-10-CM

## 2016-02-24 NOTE — Progress Notes (Signed)
  [facility]    Provider:  Dr Jaynee Eagles Referring Provider: Aretta Nip, MD Primary Care Physician:  Aretta Nip, MD  HPI:  Megan Hull is a 75 y.o. female here as a referral from Dr. Radene Ou for left leg weakness, acute onset while hospitalized in the ICU likely sciatic nerve compression or stretch injury.  Summary  Nerve conduction studies were performed on the bilateral lower extremities:  The right Peroneal motor nerve showed normal conductions with normal F Wave latency The right Tibial motor nerve showed normal conductions with normal F Wave latency The left Peroneal motor nerve showed decreased amplitude(0.30mV, N>2).  The left Peroneal F wave showed no response The left Tibial motor nerve showed decreased amplitude(1.37mV, N>3).  The left Tibial F wave showed delayed latency(58.2ms, N<58) The right Sural and Superficial Peroneal sensory nerves were within normal limits The left Sural and Superficial Peroneal sensory nerves showed no response Right H Reflex showed normal latency Left H Reflex showed no response  EMG Needle study was performed on selected left lower extremity muscles: The Left Anterior Tibialis showed increased spontaneous activity (+4 psw) patient could not activate due to weakness. The left Peroneus Longus showed increased spontaneous activity (+3 psw) patient could not activate due to weakness. The left Biceps Femoris showed increased spontaneous activity (+3 psw) with normal motor units. The left Extensor Hallucis Longus, Posterior tibialis, and Medial Gastrocnemius showed increased spontaneous activity (+2 psw) patient could not activate due to weakness. The left Gluteus Maximus and Medius and left vastus Medialis were within normal limits.  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   Cc: Dr. Tawny Asal, MD  [facility] [address] [place], [place] [address]  Phone [phone] Fax [phone]

## 2016-02-25 NOTE — Progress Notes (Signed)
See procedure note.

## 2016-02-26 NOTE — Procedures (Signed)
[facility]    Provider:  Dr Jaynee Eagles Referring Provider: Aretta Nip, MD Primary Care Physician:  Aretta Nip, MD  HPI:  Megan Hull is a 75 y.o. female here as a referral from Dr. Radene Ou for left leg weakness, acute onset while hospitalized in the ICU likely sciatic nerve compression or stretch injury.  Summary  Nerve conduction studies were performed on the bilateral lower extremities:  The right Peroneal motor nerve showed normal conductions with normal F Wave latency The right Tibial motor nerve showed normal conductions with normal F Wave latency The left Peroneal motor nerve showed decreased amplitude(0.43mV, N>2).  The left Peroneal F wave showed no response The left Tibial motor nerve showed decreased amplitude(1.44mV, N>3).  The left Tibial F wave showed delayed latency(58.76ms, N<58) The right Sural and Superficial Peroneal sensory nerves were within normal limits The left Sural and Superficial Peroneal sensory nerves showed no response Right H Reflex showed normal latency Left H Reflex showed no response  EMG Needle study was performed on selected left lower extremity muscles: The Left Anterior Tibialis showed increased spontaneous activity (+4 psw) patient could not activate due to weakness. The left Peroneus Longus showed increased spontaneous activity (+3 psw) patient could not activate due to weakness. The left Biceps Femoris showed increased spontaneous activity (+3 psw) with normal motor units. The left Extensor Hallucis Longus, Posterior tibialis, and Medial Gastrocnemius showed increased spontaneous activity (+2 psw) patient could not activate due to weakness. The left Gluteus Maximus and Medius and left vastus Medialis were within normal limits.  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   Sarina Ill, MD  [facility] [address] [place], [place]  [address]  Phone [phone] Fax [phone]

## 2016-03-08 ENCOUNTER — Ambulatory Visit: Payer: Commercial Managed Care - HMO | Admitting: Physical Therapy

## 2016-03-09 ENCOUNTER — Ambulatory Visit: Payer: Commercial Managed Care - HMO | Attending: Neurology | Admitting: Rehabilitation

## 2016-03-09 ENCOUNTER — Telehealth: Payer: Self-pay | Admitting: *Deleted

## 2016-03-09 ENCOUNTER — Encounter: Payer: Self-pay | Admitting: Rehabilitation

## 2016-03-09 DIAGNOSIS — R2689 Other abnormalities of gait and mobility: Secondary | ICD-10-CM

## 2016-03-09 DIAGNOSIS — M21372 Foot drop, left foot: Secondary | ICD-10-CM | POA: Diagnosis not present

## 2016-03-09 DIAGNOSIS — R2681 Unsteadiness on feet: Secondary | ICD-10-CM | POA: Diagnosis not present

## 2016-03-09 NOTE — Telephone Encounter (Signed)
Noted, thank you Dana.  °

## 2016-03-09 NOTE — Telephone Encounter (Signed)
Hinton Dyer- here is the message I spoke to you about, thank you!

## 2016-03-09 NOTE — Therapy (Signed)
[facility] [address] [place], [place], [address] Phone: [phone]   Fax:  [phone]  Physical Therapy Evaluation  Patient Details  Name: Megan Hull MRN: [id number] Date of Birth: 1940-09-03 Referring Provider: Sarina Ill, MD  Encounter Date: 03/09/2016      PT End of Session - 03/09/16 1159    Visit Number 1   Number of Visits 5   Date for PT Re-Evaluation 05/08/16   Authorization Type [employer] MCR-G code every 10th visit   PT Start Time 0931   PT Stop Time 1017   PT Time Calculation (min) 46 min   Activity Tolerance Patient tolerated treatment well   Behavior During Therapy [facility] for tasks assessed/performed      Past Medical History:  Diagnosis Date  . Arthritis   . Diabetes mellitus without complication (HCC)    borderline  . Diarrhea   . Diverticulosis   . Dysrhythmia    controlled with Metoprolol-12-19-13 LOV -Dr. Gwenlyn Found sees yearly  . Family history of premature CAD   . GERD (gastroesophageal reflux disease)   . Heart murmur    yrs ago  . HLD (hyperlipidemia)   . Hypertension   . Lower extremity edema   . Status post dilation of esophageal narrowing   . Supraventricular arrhythmia   . Tobacco abuse     Past Surgical History:  Procedure Laterality Date  . ANTERIOR CERVICAL DECOMP/DISCECTOMY FUSION N/A 06/08/2015   Procedure: CERVICAL FIVE-SIX ,CERVICAL SIX-SEVEN ANTERIOR CERVICAL DECOMPRESSION/DISCECTOMY FUSION PLATING BONEGRAFT;  Surgeon: Kristeen Miss, MD;  Location: [facility];  Service: Neurosurgery;  Laterality: N/A;  . 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 Adora Fridge)  . COLONOSCOPY    . ESOPHAGEAL DILATION  2014  . infected lymph node surgery  80's  . NM MYOCAR PERF WALL MOTION  04/2011   bruce myoview; normal pattern of perfusion, EF 80%, low risk scan  . TONSILLECTOMY  70's  . TRANSTHORACIC ECHOCARDIOGRAM  12/2007   mild conc LVH     There were no vitals filed for this visit.       Subjective Assessment - 03/09/16 0935    Subjective "I had an allergic reaction, and I was intubated and when I woke up, I couldn't feel this leg.  I told them that, but I was still out of it, they put me back under, and when I woke up, it was still dead."    Patient is accompained by: Family member  sister, Jonelle Sidle   Limitations Walking;House hold activities   Currently in Pain? Yes   Pain Score 5    Pain Location Leg   Pain Orientation Left   Pain Descriptors / Indicators Aching;Shooting  shooting at night   Pain Type Chronic pain   Pain Radiating Towards from lateral aspect of calf to bottom of foot   Pain Onset More than a month ago  since hospitalization 8/11   Pain Frequency Constant   Aggravating Factors  worse at night   Pain Relieving Factors rest, Tylenol            [facility] PT Assessment - 03/09/16 0001      Assessment   Medical Diagnosis L foot drop   Referring Provider Sarina Ill, MD   Onset Date/Surgical Date 01/21/16     Precautions   Required Braces or Orthoses Other Brace/Splint  has L AFO (PLS) but causes pain     Restrictions  Weight Bearing Restrictions No     Balance Screen   Has the patient fallen in the past 6 months No   Has the patient had a decrease in activity level because of a fear of falling?  No   Is the patient reluctant to leave their home because of a fear of falling?  No     Home Environment   Living Environment Private residence   Living Arrangements Other relatives  sister   Available Help at Discharge Family;Available 24 hours/day   Type of Home House   Home Access Stairs to enter   Entrance Stairs-Number of Steps 1   Entrance Stairs-Rails None   Home Layout Two level;Able to live on main level with bedroom/bathroom   [place] - 2 wheels;Cane - single point;Shower seat  tub/shower     Prior Function   Level of Independence Independent   Vocation  Retired   Leisure fish and doing puzzles, working in the yard     Cognition   Overall Cognitive Status Within Functional Limits for tasks assessed     Sensation   Light Touch Impaired Detail   Light Touch Impaired Details Impaired LLE  when left alone, feels numb, but hypersensitive with touch   Hot/Cold Appears Intact   Proprioception Impaired Detail   Proprioception Impaired Details Impaired LLE     Coordination   Gross Motor Movements are Fluid and Coordinated No   Fine Motor Movements are Fluid and Coordinated No   Coordination and Movement Description decreased from ankle down     ROM / Strength   AROM / PROM / Strength Strength     Strength   Overall Strength Deficits   Overall Strength Comments R LE WFL, L hip WFL, L knee ext 5/5, L knee flex 3-/5, L ankle DF/PF 0/5     Transfers   Transfers Sit to Stand;Stand to Sit   Sit to Stand 6: Modified independent (Device/Increase time)   Five time sit to stand comments  15.81 seconds  note she tends to avoid WB on LLE   Stand to Sit 6: Modified independent (Device/Increase time)     Ambulation/Gait   Ambulation/Gait Yes   Ambulation/Gait Assistance 5: Supervision   Ambulation/Gait Assistance Details Assessed gait initially without brace but with SPC.  Note heavy L steppage gait pattern due to zero activation at L ankle.  Donned L foot up brace during second bout to assess if this would improve safety and gait deviations.  cues for decreased L hip and knee flexion, but feel that a lot of this is due to habit, but she was able to correct and demonstrated increased confidence in balance with foot up brace.  Would like to see reaction AFO for comparison, however she did not have appropriate shoe wear today and is to bring different shoes during next session.     Ambulation Distance (Feet) 230 Feet  115' x 2   Assistive device Straight cane  once without brace, then again with foot up brace   Gait Pattern Step-through pattern;Left  steppage;Left foot flat;Poor foot clearance - left   Ambulation Surface Level;Indoor   Gait velocity 1.73 ft/sec   Stairs Yes   Stairs Assistance 6: Modified independent (Device/Increase time)   Stair Management Technique One rail Right;With cane   Number of Stairs 4   Height of Stairs 6  PT Education - 03/09/16 1158    Education provided Yes   Education Details evaluation findings, prognosis, POC, and importance of performing sit<>stand with equal WB to challenge LLE.    Person(s) Educated Patient;Other (comment)  sister   Methods Explanation   Comprehension Verbalized understanding          PT Short Term Goals - 03/09/16 1211      PT SHORT TERM GOAL #1   Title Will assess BERG balance test in order to set LTG and indicate improved balance (Target Date: 03/23/16)   Time 2   Period Weeks   Status New           PT Long Term Goals - 03/09/16 1211      PT LONG TERM GOAL #1   Title Pt will be independent with HEP in order to indicate improved functional mobility and decreased fall risk.  (Target Date: 04/06/16)   Time 4   Period Weeks   Status New     PT LONG TERM GOAL #2   Title Will assess varying braces in order to indicate improved safety due to L foot drop.     Time 4   Period Weeks   Status New     PT LONG TERM GOAL #3   Title Pt will ambulate 300' over varying indoor surfaces without AD at mod I level (with brace as needed) in order to indicate improved safety at home.    Time 4   Period Weeks   Status New     PT LONG TERM GOAL #4   Title Pt will improve BERG balance test 4 points from baseline with use of AFO/brace in order to indicate decreased fall risk.     Time 4   Period Weeks   Status New     PT LONG TERM GOAL #5   Title Pt will improve gait speed to 2.33 ft/sec w/ LRAD and bracing as needed in order to indicate decreased fall risk and improved efficency of gait.    Time 4   Period Weeks   Status  New               Plan - 03/09/16 1200    Clinical Impression Statement Pt presents with L foot drop, sciatic neuropathy following episode of angioedema and hospitalization in August.  Pt feels that nerve damage caused by compression while lying in bed in hospital. Note she has history of pre-diabetes and HTN with LE swelling.  Also note heavy use of ETOH in history although did not ask about this during session.  Upon PT evaluation, note that pt has 0/5 strength in L ankle motions and weakness in L hamstring muscle causing gait deviations and decreased balance.  Gait speed is 1.73 ft/sec, indicative of recurrent fall risk and 5TSS time of 15.81 seconds indicative of decreased functional strength.  Pt is of evolving presentation and moderate complexity per PT POC standpoint.  She is limited in her visit amount due to high copay.  Pt will benefit from skilled OP neuro PT in order to address deficits.     Rehab Potential Good   Clinical Impairments Affecting Rehab Potential high co-pay, unsure of prognosis of nerve damage.    PT Frequency 1x / week   PT Duration 4 weeks   PT Treatment/Interventions ADLs/Self Care Home Management;Electrical Stimulation;DME Instruction;Gait training;Stair training;Functional mobility training;Therapeutic activities;Therapeutic exercise;Balance training;Neuromuscular re-education;Patient/family education;Orthotic Fit/Training   PT Next Visit Plan BERG, Pt to bring in other pair or  shoes to trial reaction AFO.  If reaction doesn't look good/feel good to pt, then we spoke about her ordering foot up brace on [place], so just show them how to do that/what size.  HEP for LLE strengthening, esp hamstrings, functional strength.    Consulted and Agree with Plan of Care Patient;Family member/caregiver   Family Member Consulted sister      Patient will benefit from skilled therapeutic intervention in order to improve the following deficits and impairments:  Abnormal gait,  Decreased balance, Decreased coordination, Decreased knowledge of use of DME, Decreased mobility, Decreased strength, Increased edema, Impaired flexibility, Improper body mechanics, Postural dysfunction, Pain  Visit Diagnosis: Unsteadiness on feet - Plan: PT plan of care cert/re-cert  Other abnormalities of gait and mobility - Plan: PT plan of care cert/re-cert  Foot drop, left - Plan: PT plan of care cert/re-cert      G-Codes - [id number] 10-10-1217    Functional Assessment Tool Used gait speed 1.73 ft/sec, 5TSS: 15.81 seconds   Functional Limitation Mobility: Walking and moving around   Mobility: Walking and Moving Around Current Status [phone]) At least 40 percent but less than 60 percent impaired, limited or restricted   Mobility: Walking and Moving Around Goal Status [phone]) At least 1 percent but less than 20 percent impaired, limited or restricted       Problem List Patient Active Problem List   Diagnosis Date Noted  . Left leg weakness 02/10/2016  . Left foot drop 02/10/2016  . DJD (degenerative joint disease) 01/26/2016  . Acute respiratory failure with hypoxia ([place]) 01/22/2016  . Angioedema 01/21/2016  . Cervical spondylosis with myelopathy and radiculopathy 06/08/2015  . Coronary artery disease 12/18/2014  . Esophageal stricture 04/23/2014  . Lower extremity numbness 08/11/2013  . Essential hypertension 05/15/2013  . Hyperlipidemia 05/15/2013  . Dyspnea on exertion 05/15/2013  . Lower extremity edema 05/15/2013  . Diarrhea 02/12/2013  . Dyspepsia and other specified disorders of function of stomach 09/23/2012  . Esophageal reflux 01/20/2010  . Dysphagia 01/20/2010    Cameron Sprang, PT, MPT [facility] [address] [place], [place], [address] Phone: [phone]   Fax:  [phone] 03/09/16, 12:21 PM  Name: Megan Hull MRN: [id number] Date of Birth: Jul 12, 1940

## 2016-03-09 NOTE — Telephone Encounter (Signed)
I have called and spoke to patient. Patient   has contacted [facility] and she has a $10.00 co pay not $ 45.00. Patient is going to call [facility] and relay to them her co pay is $10.00 not $ 45.00. Patient is going to keep going to [facility].  Claim  number from [facility] # [id number].

## 2016-03-09 NOTE — Telephone Encounter (Signed)
That's fine. thanks

## 2016-03-09 NOTE — Telephone Encounter (Signed)
Dr Jaynee Eagles- are you okay with this? You last saw patient on 02/10/16.

## 2016-03-22 DIAGNOSIS — T7840XA Allergy, unspecified, initial encounter: Secondary | ICD-10-CM | POA: Diagnosis not present

## 2016-03-27 ENCOUNTER — Encounter: Payer: Self-pay | Admitting: Rehabilitation

## 2016-03-27 ENCOUNTER — Ambulatory Visit: Payer: Commercial Managed Care - HMO | Attending: Neurology | Admitting: Rehabilitation

## 2016-03-27 DIAGNOSIS — R2689 Other abnormalities of gait and mobility: Secondary | ICD-10-CM | POA: Diagnosis not present

## 2016-03-27 DIAGNOSIS — R2681 Unsteadiness on feet: Secondary | ICD-10-CM

## 2016-03-27 DIAGNOSIS — M21372 Foot drop, left foot: Secondary | ICD-10-CM | POA: Diagnosis not present

## 2016-03-27 NOTE — Patient Instructions (Signed)
Hamstring Stretch, Seated (Strap, Two Chairs)    *Can also do this sitting up in bed* Sit with one leg extended onto facing chair. Loop strap over outstretched foot at ball of big toe. Lengthen spine-sit tall, don't slump.  Hold for __60__ seconds. Repeat _2-3___ times each leg.  Do in the morning before getting out of bed and also after you have been up walking for a while.    Copyright  VHI. All rights reserved.    Functional Quadriceps: Sit to Stand    Sit on edge of chair, feet flat on floor. Stand upright, extending knees fully.  GO SLOW and make sure your feet are under you when you stand.   Repeat _15___ times per set. Do __1__ sets per session. Do _2___ sessions per day.  http://orth.exer.us/735   Copyright  VHI. All rights reserved.

## 2016-03-27 NOTE — Therapy (Signed)
[facility] [address] [place], [place], [address] Phone: [phone]   Fax:  [phone]  Physical Therapy Treatment  Patient Details  Name: Megan Hull MRN: [id number] Date of Birth: 27-Oct-1940 Referring Provider: Sarina Ill, MD  Encounter Date: 03/27/2016      PT End of Session - 03/27/16 0854    Visit Number 2   Number of Visits 5   Date for PT Re-Evaluation 05/08/16   Authorization Type [employer] MCR-G code every 10th visit   PT Start Time 0847   PT Stop Time 0932   PT Time Calculation (min) 45 min   Activity Tolerance Patient tolerated treatment well   Behavior During Therapy [facility] for tasks assessed/performed      Past Medical History:  Diagnosis Date  . Arthritis   . Diabetes mellitus without complication (HCC)    borderline  . Diarrhea   . Diverticulosis   . Dysrhythmia    controlled with Metoprolol-12-19-13 LOV -Dr. Gwenlyn Found sees yearly  . Family history of premature CAD   . GERD (gastroesophageal reflux disease)   . Heart murmur    yrs ago  . HLD (hyperlipidemia)   . Hypertension   . Lower extremity edema   . Status post dilation of esophageal narrowing   . Supraventricular arrhythmia   . Tobacco abuse     Past Surgical History:  Procedure Laterality Date  . ANTERIOR CERVICAL DECOMP/DISCECTOMY FUSION N/A 06/08/2015   Procedure: CERVICAL FIVE-SIX ,CERVICAL SIX-SEVEN ANTERIOR CERVICAL DECOMPRESSION/DISCECTOMY FUSION PLATING BONEGRAFT;  Surgeon: Kristeen Miss, MD;  Location: [facility];  Service: Neurosurgery;  Laterality: N/A;  . 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 Adora Fridge)  . COLONOSCOPY    . ESOPHAGEAL DILATION  2014  . infected lymph node surgery  80's  . NM MYOCAR PERF WALL MOTION  04/2011   bruce myoview; normal pattern of perfusion, EF 80%, low risk scan  . TONSILLECTOMY  70's  . TRANSTHORACIC ECHOCARDIOGRAM  12/2007   mild conc LVH     There were no vitals filed for this visit.      Subjective Assessment - 03/27/16 0853    Subjective Note pt ambulates into clinic today with foot up brace donned, does not have her brace with her.    Patient is accompained by: Family member   Limitations Walking;House hold activities   Currently in Pain? No/denies                         [facility] Adult PT Treatment/Exercise - 03/27/16 0916      Ambulation/Gait   Ambulation/Gait Yes   Ambulation/Gait Assistance 5: Supervision;4: Min guard   Ambulation/Gait Assistance Details Continue to assess gait with varying braces.  Pt has purchased a foot up brace (despite being told to wait until this session), therefore adjusted for her (as it was too high in shoe to be effective) and also assessed gait with L reaction AFO.  Performed 3 laps with foot up brace with intermittent L knee recurvatum, causing pt to stop and "catch" herself using cane more heavily.  Then performed 3 laps x 2 reps with L reaction AFO.  Note improved stability and L foot clearance vs foot up brace.  Pt able to load LLE better during stance phase but did note mild L knee recurvatum, therefore added small 1/4" heel wedge with marked improvement during second rep of gait. Cues for posture however  note that pt has to use cane less when using reaction AFO.  Education that PT would pursue order for brace.     Ambulation Distance (Feet) 345 Feet   x 3 reps   Assistive device Straight cane   Gait Pattern Step-through pattern;Left steppage;Left foot flat;Poor foot clearance - left   Ambulation Surface Level;Indoor     Neuro Re-ed    Neuro Re-ed Details  Sit <>stand x 20 reps during session.  Initially without use of mirror with continued slight decrease in weight shift and WB on LLE, therefore provided mirror for increased visual feedback as well as max verbal cues to perform task slowly with emphasis on keeping LLE under her when sitting/standing.  She also needs  cues during session for carryover of task.  Provided this for HEP, see pt instruction.      Exercises   Exercises Other Exercises   Other Exercises  Seated hamstring and gastroc stretch on LLE seated with LLE propped in chair using gait belt for increased stretch.  Cues for posture.  Performed x 2 reps of 1 min each with cues for posture.  Provided in HEP, see pt instruction.                  PT Education - 03/27/16 0854    Education provided Yes   Education Details HEP, see pt instructions and brace options   Person(s) Educated Patient   Methods Explanation;Demonstration;Handout   Comprehension Verbalized understanding;Returned demonstration          PT Short Term Goals - 03/09/16 1211      PT SHORT TERM GOAL #1   Title Will assess BERG balance test in order to set LTG and indicate improved balance (Target Date: 03/23/16)   Time 2   Period Weeks   Status New           PT Long Term Goals - 03/09/16 1211      PT LONG TERM GOAL #1   Title Pt will be independent with HEP in order to indicate improved functional mobility and decreased fall risk.  (Target Date: 04/06/16)   Time 4   Period Weeks   Status New     PT LONG TERM GOAL #2   Title Will assess varying braces in order to indicate improved safety due to L foot drop.     Time 4   Period Weeks   Status New     PT LONG TERM GOAL #3   Title Pt will ambulate 300' over varying indoor surfaces without AD at mod I level (with brace as needed) in order to indicate improved safety at home.    Time 4   Period Weeks   Status New     PT LONG TERM GOAL #4   Title Pt will improve BERG balance test 4 points from baseline with use of AFO/brace in order to indicate decreased fall risk.     Time 4   Period Weeks   Status New     PT LONG TERM GOAL #5   Title Pt will improve gait speed to 2.33 ft/sec w/ LRAD and bracing as needed in order to indicate decreased fall risk and improved efficency of gait.    Time 4    Period Weeks   Status New               Plan - 03/27/16 1214    Clinical Impression Statement Skilled session focused on assessment of gait with foot  up brace (note that she has already purchased brace but was not tight enough in shoe, therefore PT fixed during session) as well as with reaction AFO.  Note marked improvement in gait with L reaction AFO and her ability to load LLE during stance markedly improved with reaction.  Pt verbalized understanding and agreement.  PT to request order from MD for new brace.  Also provided pt with HEP, see pt instruction.    Rehab Potential Good   Clinical Impairments Affecting Rehab Potential high co-pay, unsure of prognosis of nerve damage.    PT Frequency 1x / week   PT Duration 4 weeks   PT Treatment/Interventions ADLs/Self Care Home Management;Electrical Stimulation;DME Instruction;Gait training;Stair training;Functional mobility training;Therapeutic activities;Therapeutic exercise;Balance training;Neuromuscular re-education;Patient/family education;Orthotic Fit/Training   PT Next Visit Plan BERG!!  If Gerald Stabs present, assess her brace (PLS) vs reaction brace and determine which appropriate.     Consulted and Agree with Plan of Care Patient;Family member/caregiver   Family Member Consulted sister      Patient will benefit from skilled therapeutic intervention in order to improve the following deficits and impairments:  Abnormal gait, Decreased balance, Decreased coordination, Decreased knowledge of use of DME, Decreased mobility, Decreased strength, Increased edema, Impaired flexibility, Improper body mechanics, Postural dysfunction, Pain  Visit Diagnosis: Unsteadiness on feet  Other abnormalities of gait and mobility  Foot drop, left     Problem List Patient Active Problem List   Diagnosis Date Noted  . Left leg weakness 02/10/2016  . Left foot drop 02/10/2016  . DJD (degenerative joint disease) 01/26/2016  . Acute respiratory  failure with hypoxia ([place]) 01/22/2016  . Angioedema 01/21/2016  . Cervical spondylosis with myelopathy and radiculopathy 06/08/2015  . Coronary artery disease 12/18/2014  . Esophageal stricture 04/23/2014  . Lower extremity numbness 08/11/2013  . Essential hypertension 05/15/2013  . Hyperlipidemia 05/15/2013  . Dyspnea on exertion 05/15/2013  . Lower extremity edema 05/15/2013  . Diarrhea 02/12/2013  . Dyspepsia and other specified disorders of function of stomach 09/23/2012  . Esophageal reflux 01/20/2010  . Dysphagia 01/20/2010    Denice Bors 03/27/2016, 12:33 PM  [facility] [address] [place], [place], [address] Phone: [phone]   Fax:  [phone]  Name: Megan Hull MRN: [id number] Date of Birth: Jul 21, 1940

## 2016-04-03 ENCOUNTER — Ambulatory Visit: Payer: Commercial Managed Care - HMO | Admitting: Physical Therapy

## 2016-04-03 ENCOUNTER — Ambulatory Visit: Payer: Commercial Managed Care - HMO | Admitting: Rehabilitation

## 2016-04-03 ENCOUNTER — Encounter: Payer: Self-pay | Admitting: Rehabilitation

## 2016-04-03 DIAGNOSIS — R600 Localized edema: Secondary | ICD-10-CM | POA: Diagnosis not present

## 2016-04-03 DIAGNOSIS — R2681 Unsteadiness on feet: Secondary | ICD-10-CM | POA: Diagnosis not present

## 2016-04-03 DIAGNOSIS — M21372 Foot drop, left foot: Secondary | ICD-10-CM | POA: Diagnosis not present

## 2016-04-03 DIAGNOSIS — Z7982 Long term (current) use of aspirin: Secondary | ICD-10-CM | POA: Insufficient documentation

## 2016-04-03 DIAGNOSIS — F17210 Nicotine dependence, cigarettes, uncomplicated: Secondary | ICD-10-CM | POA: Insufficient documentation

## 2016-04-03 DIAGNOSIS — R2689 Other abnormalities of gait and mobility: Secondary | ICD-10-CM | POA: Diagnosis not present

## 2016-04-03 DIAGNOSIS — E119 Type 2 diabetes mellitus without complications: Secondary | ICD-10-CM | POA: Insufficient documentation

## 2016-04-03 DIAGNOSIS — I10 Essential (primary) hypertension: Secondary | ICD-10-CM | POA: Diagnosis not present

## 2016-04-03 DIAGNOSIS — I2510 Atherosclerotic heart disease of native coronary artery without angina pectoris: Secondary | ICD-10-CM | POA: Insufficient documentation

## 2016-04-03 NOTE — Patient Instructions (Signed)
Achilles Tendon Stretch    Stand with hands supported on wall, elbows slightly bent, feet parallel and both heels on floor, front knee bent, back knee straight. Slowly relax back knee until a stretch is felt in achilles tendon. Hold __60__ seconds. Repeat with leg positions switched.  Do 2-3 times, multiple times per day.   Copyright  VHI. All rights reserved.

## 2016-04-03 NOTE — Therapy (Signed)
[facility] [address] [place], [place], [address] Phone: [phone]   Fax:  [phone]  Physical Therapy Treatment  Patient Details  Name: Megan Hull MRN: [id number] Date of Birth: [birth year] Referring Provider: Sarina Ill, MD  Encounter Date: 04/03/2016      PT End of Session - 04/03/16 1130    Visit Number 3   Number of Visits 14  updated POC   Date for PT Re-Evaluation 05/29/16  updated POC   Authorization Type [employer] MCR-G code every 10th visit   PT Start Time 0936   PT Stop Time 1016   PT Time Calculation (min) 40 min   Activity Tolerance Patient tolerated treatment well   Behavior During Therapy [facility] for tasks assessed/performed      Past Medical History:  Diagnosis Date  . Arthritis   . Diabetes mellitus without complication (HCC)    borderline  . Diarrhea   . Diverticulosis   . Dysrhythmia    controlled with Metoprolol-12-19-13 LOV -Dr. Gwenlyn Found sees yearly  . Family history of premature CAD   . GERD (gastroesophageal reflux disease)   . Heart murmur    yrs ago  . HLD (hyperlipidemia)   . Hypertension   . Lower extremity edema   . Status post dilation of esophageal narrowing   . Supraventricular arrhythmia   . Tobacco abuse     Past Surgical History:  Procedure Laterality Date  . ANTERIOR CERVICAL DECOMP/DISCECTOMY FUSION N/A 06/08/2015   Procedure: CERVICAL FIVE-SIX ,CERVICAL SIX-SEVEN ANTERIOR CERVICAL DECOMPRESSION/DISCECTOMY FUSION PLATING BONEGRAFT;  Surgeon: Kristeen Miss, MD;  Location: [facility];  Service: Neurosurgery;  Laterality: N/A;  . 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 Adora Fridge)  . COLONOSCOPY    . ESOPHAGEAL DILATION  2014  . infected lymph node surgery  80's  . NM MYOCAR PERF WALL MOTION  04/2011   bruce myoview; normal pattern of perfusion, EF 80%, low risk scan  . TONSILLECTOMY  70's  . TRANSTHORACIC  ECHOCARDIOGRAM  12/2007   mild conc LVH    There were no vitals filed for this visit.      Subjective Assessment - 04/03/16 0939    Subjective Notes pain on lateral side of L foot.  Has been wearing brace for two days.     Patient is accompained by: Family member   Limitations Walking;House hold activities   Currently in Pain? No/denies   Pain Score 5    Pain Location Foot   Pain Orientation Left   Pain Descriptors / Indicators Aching   Pain Type Acute pain   Pain Onset More than a month ago   Pain Frequency Constant                         [facility] Adult PT Treatment/Exercise - 04/03/16 1000      Transfers   Transfers Sit to Stand;Stand to Sit   Sit to Stand 5: Supervision   Stand to Sit 5: Supervision   Comments Continues to require cues for equal WB, esp when outside the frame of doing sit<>stand as "exercise"      Ambulation/Gait   Ambulation/Gait Yes   Ambulation/Gait Assistance 5: Supervision;4: Min guard;4: Min assist  min A without brace   Ambulation/Gait Assistance Details Continue to assess bracing needs during therapy session.  Note she has her L PLS AFO donned today.  As she is ambulating into clinic,  note that she tends to abduct LLE away from body to avoid WB.  Provided cues for this during session with marked improvement.  Once seated, note pt remarks pain in lateral side of L foot.  Once removed shoe, note prominent 5th met head, likely causing pressure area/callus.  Brace does not seem to add to this pain.  Performed gait x 115' with her PLS AFO with cues for posture and slower gait speed.   Then performed another 63' with reaction AFO.  Functionally, there is not a lot of difference between AFO's, therefore disucssed that she should continue to wear her current brace.  Discussed shoe wear as she shoes she had on during session are very flat with decreased sole width.  Recommended she look around for lace up shoes with improved sole for improved foot  support in hopes that this decreases L foot pain.  Pt verbalized understanding.     Ambulation Distance (Feet) --  see above   Assistive device Straight cane;None   Gait Pattern Step-through pattern;Left steppage;Left foot flat;Poor foot clearance - left   Ambulation Surface Level;Indoor   Stairs Yes   Stairs Assistance 5: Supervision   Stairs Assistance Details (indicate cue type and reason) Assessed stairs without brace during session to see if flexibility has improved in LLE.  Note she continues to have pain when descending with RLE due to increased stretch put on foot.  Educated to continue performing stretches as in exercise section to improve flexibility.     Stair Management Technique Two rails;Alternating pattern;Step to pattern;Forwards   Number of Stairs 4  x 2 reps   Height of Stairs 6     Self-Care   Self-Care Other Self-Care Comments   Other Self-Care Comments  Discussed shoe wear, see details in gait section     Exercises   Other Exercises  Performed stair calf stretch with UE support on railings.  Tolerated well x 1 min, however does not have stairs inside home with rail.  Therefore transitioned to standing against wall stetching heel cords x 2 mins.  Printed this for pt for improved carryover and discussed that she could replace strap stretch for this one if strap is hurting her foot.                  PT Education - 04/03/16 1130    Education provided Yes   Education Details addition of stretch to HEP, discussion of keeping her brace and getting more appropriate shoe wear.    Person(s) Educated Patient   Methods Explanation;Handout   Comprehension Verbalized understanding;Returned demonstration          PT Short Term Goals - 04/03/16 1137      PT SHORT TERM GOAL #1   Title Will assess BERG balance test in order to set LTG and indicate improved balance (Target Date: 03/23/16)   Time 2   Period Weeks   Status On-going           PT Long Term Goals -  04/03/16 1136      PT LONG TERM GOAL #1   Title Pt will be independent with HEP in order to indicate improved functional mobility and decreased fall risk.  (Modified Target Date: 05/29/16)   Time 4   Period Weeks   Status New     PT LONG TERM GOAL #2   Title Will assess varying braces in order to indicate improved safety due to L foot drop.     Time 4  Period Weeks   Status New     PT LONG TERM GOAL #3   Title Pt will ambulate 300' over varying indoor surfaces without AD at mod I level (with brace as needed) in order to indicate improved safety at home.    Time 4   Period Weeks   Status New     PT LONG TERM GOAL #4   Title Pt will improve BERG balance test 4 points from baseline with use of AFO/brace in order to indicate decreased fall risk.     Time 4   Period Weeks   Status New     PT LONG TERM GOAL #5   Title Pt will improve gait speed to 2.33 ft/sec w/ LRAD and bracing as needed in order to indicate decreased fall risk and improved efficency of gait.    Time 4   Period Weeks   Status New               Plan - 04/03/16 1133    Clinical Impression Statement Skilled session focused on gait with use of her PLS AFO and comparison between this and reaction AFO.  Functionally did not notice a lot of difference, therefore would recommend she continue to wear her PLS AFO.  Did discuss shoe wear as she was wearing flat soled shoes during session and feel that this could be causing some of her foot pain.    Rehab Potential Good   Clinical Impairments Affecting Rehab Potential high co-pay, unsure of prognosis of nerve damage.    PT Frequency 2x / week   PT Duration 8 weeks  will likely only need 4 weeks more of PT   PT Treatment/Interventions ADLs/Self Care Home Management;Electrical Stimulation;DME Instruction;Gait training;Stair training;Functional mobility training;Therapeutic activities;Therapeutic exercise;Balance training;Neuromuscular re-education;Patient/family  education;Orthotic Fit/Training   PT Next Visit Plan BERG!!  LLE NMR, forced use   Consulted and Agree with Plan of Care Patient;Family member/caregiver   Family Member Consulted sister      Patient will benefit from skilled therapeutic intervention in order to improve the following deficits and impairments:  Abnormal gait, Decreased balance, Decreased coordination, Decreased knowledge of use of DME, Decreased mobility, Decreased strength, Increased edema, Impaired flexibility, Improper body mechanics, Postural dysfunction, Pain  Visit Diagnosis: Unsteadiness on feet - Plan: PT plan of care cert/re-cert  Other abnormalities of gait and mobility - Plan: PT plan of care cert/re-cert  Foot drop, left - Plan: PT plan of care cert/re-cert     Problem List Patient Active Problem List   Diagnosis Date Noted  . Left leg weakness 02/10/2016  . Left foot drop 02/10/2016  . DJD (degenerative joint disease) 01/26/2016  . Acute respiratory failure with hypoxia ([place]) 01/22/2016  . Angioedema 01/21/2016  . Cervical spondylosis with myelopathy and radiculopathy 06/08/2015  . Coronary artery disease 12/18/2014  . Esophageal stricture 04/23/2014  . Lower extremity numbness 08/11/2013  . Essential hypertension 05/15/2013  . Hyperlipidemia 05/15/2013  . Dyspnea on exertion 05/15/2013  . Lower extremity edema 05/15/2013  . Diarrhea 02/12/2013  . Dyspepsia and other specified disorders of function of stomach 09/23/2012  . Esophageal reflux 01/20/2010  . Dysphagia 01/20/2010    Cameron Sprang, PT, MPT [facility] [address] [place], [place], [address] Phone: [phone]   Fax:  [phone] 04/03/16, 11:48 AM  Name: Megan Hull MRN: [id number] Date of Birth: 1941-01-21

## 2016-04-04 ENCOUNTER — Emergency Department (HOSPITAL_COMMUNITY)
Admission: EM | Admit: 2016-04-04 | Discharge: 2016-04-04 | Disposition: A | Discharge status: Home / Self Care | Payer: Commercial Managed Care - HMO | Attending: Emergency Medicine | Admitting: Emergency Medicine

## 2016-04-04 ENCOUNTER — Encounter (HOSPITAL_COMMUNITY): Payer: Self-pay | Admitting: Emergency Medicine

## 2016-04-04 DIAGNOSIS — I10 Essential (primary) hypertension: Secondary | ICD-10-CM

## 2016-04-04 LAB — CBC WITH DIFFERENTIAL/PLATELET
BASOS PCT: 1 %
Basophils Absolute: 0.1 10*3/uL (ref 0.0–0.1)
EOS ABS: 0.2 10*3/uL (ref 0.0–0.7)
EOS PCT: 4 %
HCT: 40.6 % (ref 36.0–46.0)
Hemoglobin: 13.5 g/dL (ref 12.0–15.0)
Lymphocytes Relative: 54 %
Lymphs Abs: 3.4 10*3/uL (ref 0.7–4.0)
MCH: 30.1 pg (ref 26.0–34.0)
MCHC: 33.3 g/dL (ref 30.0–36.0)
MCV: 90.4 fL (ref 78.0–100.0)
MONO ABS: 0.5 10*3/uL (ref 0.1–1.0)
MONOS PCT: 8 %
Neutro Abs: 2 10*3/uL (ref 1.7–7.7)
Neutrophils Relative %: 33 %
Platelets: 237 10*3/uL (ref 150–400)
RBC: 4.49 MIL/uL (ref 3.87–5.11)
RDW: 13.5 % (ref 11.5–15.5)
WBC: 6.2 10*3/uL (ref 4.0–10.5)

## 2016-04-04 LAB — COMPREHENSIVE METABOLIC PANEL
ALBUMIN: 3.9 g/dL (ref 3.5–5.0)
ALT: 15 U/L (ref 14–54)
ANION GAP: 9 (ref 5–15)
AST: 21 U/L (ref 15–41)
Alkaline Phosphatase: 62 U/L (ref 38–126)
BUN: 17 mg/dL (ref 6–20)
CO2: 25 mmol/L (ref 22–32)
Calcium: 9.5 mg/dL (ref 8.9–10.3)
Chloride: 103 mmol/L (ref 101–111)
Creatinine, Ser: 1.11 mg/dL — ABNORMAL HIGH (ref 0.44–1.00)
GFR calc non Af Amer: 47 mL/min — ABNORMAL LOW (ref >60)
GFR, EST AFRICAN AMERICAN: 54 mL/min — AB (ref >60)
GLUCOSE: 112 mg/dL — AB (ref 65–99)
POTASSIUM: 4.3 mmol/L (ref 3.5–5.1)
SODIUM: 137 mmol/L (ref 135–145)
TOTAL PROTEIN: 6.7 g/dL (ref 6.5–8.1)
Total Bilirubin: 0.5 mg/dL (ref 0.3–1.2)

## 2016-04-04 MED ORDER — AMLODIPINE BESYLATE 10 MG PO TABS: 10.0000 mg | ORAL_TABLET | Freq: Every day | ORAL | 0 refills | Status: DC

## 2016-04-04 MED ORDER — AMLODIPINE BESYLATE 5 MG PO TABS
10.0000 mg | ORAL_TABLET | Freq: Once | ORAL | Status: AC
  Administered 2016-04-04: 10 mg via ORAL
  Filled 2016-04-04: qty 2

## 2016-04-04 MED ORDER — SODIUM CHLORIDE 0.9 % IV BOLUS (SEPSIS)
1000.0000 mL | Freq: Once | INTRAVENOUS | Status: AC
  Administered 2016-04-04: 1000 mL via INTRAVENOUS

## 2016-04-04 NOTE — ED Provider Notes (Signed)
[facility] DEPT Provider Note   CSN: [id number] Arrival date & time: 04/03/16  2349  By signing my name below, I, Royce Macadamia, attest that this documentation has been prepared under the direction and in the presence of Everlene Balls, MD . Electronically Signed: Royce Macadamia, Scribe. 04/04/2016. 1:26 AM.  History   Chief Complaint Chief Complaint  Patient presents with  . Hypertension    The history is provided by the patient and medical records. No language interpreter was used.     HPI Comments:  Megan Hull is a 75 y.o. female who presents to the Emergency Department complaining of hypertension tonight.  Pt states that she began feeling a hot burning pressure behind her left ear and measured her blood pressure.  It was 128/111.  Pt states that she has had similar episodes recently that are also associated with pressure behind her left ear.  In August pt was seen in the ED for an allergic reaction to Diovan and had to be intubated.  Pt was taken off of Diovan and had her dose of metroprolol increased to twice a day and added isosorbide QD.  Pt has spoken to her cardiologist and they are considering adding amlodipine.  Pt never had problems with her blood pressure prior to removing Diovan and states it was typically 128/78.  Pt states that she drinks about a bottle and a half of water a day and a cup of coffee in the morning; she does not drink soda.    Past Medical History:  Diagnosis Date  . Arthritis   . Diabetes mellitus without complication (HCC)    borderline  . Diarrhea   . Diverticulosis   . Dysrhythmia    controlled with Metoprolol-12-19-13 LOV -Dr. Gwenlyn Found sees yearly  . Family history of premature CAD   . GERD (gastroesophageal reflux disease)   . Heart murmur    yrs ago  . HLD (hyperlipidemia)   . Hypertension   . Lower extremity edema   . Status post dilation of esophageal narrowing   . Supraventricular arrhythmia   . Tobacco abuse      Patient Active Problem List   Diagnosis Date Noted  . Left leg weakness 02/10/2016  . Left foot drop 02/10/2016  . DJD (degenerative joint disease) 01/26/2016  . Acute respiratory failure with hypoxia ([place]) 01/22/2016  . Angioedema 01/21/2016  . Cervical spondylosis with myelopathy and radiculopathy 06/08/2015  . Coronary artery disease 12/18/2014  . Esophageal stricture 04/23/2014  . Lower extremity numbness 08/11/2013  . Essential hypertension 05/15/2013  . Hyperlipidemia 05/15/2013  . Dyspnea on exertion 05/15/2013  . Lower extremity edema 05/15/2013  . Diarrhea 02/12/2013  . Dyspepsia and other specified disorders of function of stomach 09/23/2012  . Esophageal reflux 01/20/2010  . Dysphagia 01/20/2010    Past Surgical History:  Procedure Laterality Date  . ANTERIOR CERVICAL DECOMP/DISCECTOMY FUSION N/A 06/08/2015   Procedure: CERVICAL FIVE-SIX ,CERVICAL SIX-SEVEN ANTERIOR CERVICAL DECOMPRESSION/DISCECTOMY FUSION PLATING BONEGRAFT;  Surgeon: Kristeen Miss, MD;  Location: [facility];  Service: Neurosurgery;  Laterality: N/A;  . 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 Adora Fridge)  . COLONOSCOPY    . ESOPHAGEAL DILATION  2014  . infected lymph node surgery  80's  . NM MYOCAR PERF WALL MOTION  04/2011   bruce myoview; normal pattern of perfusion, EF 80%, low risk scan  . TONSILLECTOMY  70's  . TRANSTHORACIC ECHOCARDIOGRAM  12/2007   mild conc LVH  OB History    No data available       Home Medications    Prior to Admission medications   Medication Sig Start Date End Date Taking? Authorizing Provider  aspirin 81 MG tablet Take 162 mg by mouth daily.     Historical Provider, MD  cetirizine (ZYRTEC) 10 MG tablet Take 10 mg by mouth daily as needed for allergies.    Historical Provider, MD  diphenhydrAMINE (BENADRYL) 25 MG tablet Take 2 tablets (50 mg total) by mouth every 6 (six) hours as needed for allergies. Patient taking  differently: Take 50 mg by mouth once as needed (severe allergic reaction).  12/11/15   Patrecia Pour, MD  EPINEPHrine 0.3 mg/0.3 mL IJ SOAJ injection Inject 0.3 mLs (0.3 mg total) into the muscle once as needed (anaphylaxis). 12/11/15   Patrecia Pour, MD  furosemide (LASIX) 20 MG tablet Take 1 tablet (20 mg total) by mouth daily. Patient taking differently: Take 10 mg by mouth daily.  12/18/14   Lorretta Harp, MD  gabapentin (NEURONTIN) 300 MG capsule Take 1 capsule (300 mg total) by mouth 3 (three) times daily. 02/10/16   Melvenia Beam, MD  isosorbide mononitrate (IMDUR) 30 MG 24 hr tablet Take 1 tablet (30 mg total) by mouth daily. 01/29/16   Kelvin Cellar, MD  metoprolol tartrate (LOPRESSOR) 25 MG tablet Take 1 tablet (25 mg total) by mouth 2 (two) times daily. 01/29/16   Kelvin Cellar, MD  simvastatin (ZOCOR) 40 MG tablet Take 1 tablet (40 mg total) by mouth daily. Patient taking differently: Take 40 mg by mouth every 3 (three) days.  12/18/14   Lorretta Harp, MD    Family History Family History  Problem Relation Age of Onset  . Heart disease Brother     MI @ 40, HTN @ 61, DM @ 55   . Heart disease Father     MI  . Heart disease Mother     MI, CVA @ 27  . Heart disease Sister     CHF, CVA  . Esophageal cancer Brother   . Alzheimer's disease Sister   . Heart disease Brother     MI in 97s, lung cancer  . Heart disease Sister   . Hypertension Sister     x2  . Hyperlipidemia Sister     x2  . Diabetes Sister     x2  . Cancer Sister   . Hypertension Brother   . Colon cancer Neg Hx     Social History Social History  Substance Use Topics  . Smoking status: Current Every Day Smoker    Packs/day: 0.25    Years: 15.00    Types: Cigarettes  . Smokeless tobacco: Never Used  . Alcohol use 12.6 oz/week    7 Shots of liquor, 14 Standard drinks or equivalent per week     Comment: every night scotch     Allergies   Nexium [esomeprazole magnesium]; Other; Codeine; Ace  inhibitors; Monosodium glutamate; Shellfish allergy; and Tape   Review of Systems Review of Systems  Constitutional: Negative for fever.  Cardiovascular:       Positive hypertension  All other systems reviewed and are negative.    Physical Exam Updated Vital Signs BP (!) 213/94 (BP Location: Right Arm)   Pulse 76   Temp 97.6 F (36.4 C) (Axillary)   SpO2 99%   Physical Exam  Constitutional: She is oriented to person, place, and time. She appears well-developed and well-nourished.  No distress.  HENT:  Head: Normocephalic and atraumatic.  Nose: Nose normal.  Mouth/Throat: Oropharynx is clear and moist. No oropharyngeal exudate.  Eyes: Conjunctivae and EOM are normal. Pupils are equal, round, and reactive to light. No scleral icterus.  Neck: Normal range of motion. Neck supple. No JVD present. No tracheal deviation present. No thyromegaly present.  Cardiovascular: Normal rate, regular rhythm and normal heart sounds.  Exam reveals no gallop and no friction rub.   No murmur heard. Pulmonary/Chest: Effort normal and breath sounds normal. No respiratory distress. She has no wheezes. She exhibits no tenderness.  Abdominal: Soft. Bowel sounds are normal. She exhibits no distension and no mass. There is no tenderness. There is no rebound and no guarding.  Musculoskeletal: Normal range of motion. She exhibits edema. She exhibits no tenderness.  Trace BLE edema.  Lymphadenopathy:    She has no cervical adenopathy.  Neurological: She is alert and oriented to person, place, and time. No cranial nerve deficit. She exhibits normal muscle tone.  Normal strength and sensation to all extremities.  Normal cerebellar testing.  Normal gait   Skin: Skin is warm and dry. No rash noted. No erythema. No pallor.  Nursing note and vitals reviewed.   ED Treatments / Results   DIAGNOSTIC STUDIES:  Oxygen Saturation is 99% on RA, NML by my interpretation.    COORDINATION OF CARE:  1:27 AM  Discussed treatment plan with pt at bedside and pt agreed to plan.  Labs (all labs ordered are listed, but only abnormal results are displayed) Labs Reviewed  COMPREHENSIVE METABOLIC PANEL - Abnormal; Notable for the following:       Result Value   Glucose, Bld 112 (*)    Creatinine, Ser 1.11 (*)    GFR calc non Af Amer 47 (*)    GFR calc Af Amer 54 (*)    All other components within normal limits  CBC WITH DIFFERENTIAL/PLATELET    EKG  EKG Interpretation None       Radiology No results found.  Procedures Procedures (including critical care time)  Medications Ordered in ED Medications - No data to display   Initial Impression / Assessment and Plan / ED Course  I have reviewed the triage vital signs and the nursing notes.  Pertinent labs & imaging results that were available during my care of the patient were reviewed by me and considered in my medical decision making (see chart for details).  Clinical Course    Patient presents to the ED for HTN.  She is currently on metoprolol and isosorbide.  She was advised that metoprolol is not a great anti HTN medication and she should not take extra if her BP is high.  Will start her on amlodipine and have her fu with PCP within 3 days for repeat BP check.    Currently BP is 160/90 and she remains asymptomatic.  She is safe for DC and is in NAD.  Final Clinical Impressions(s) / ED Diagnoses   Final diagnoses:  None    New Prescriptions New Prescriptions   No medications on file    I personally performed the services described in this documentation, which was scribed in my presence. The recorded information has been reviewed and is accurate.      Everlene Balls, MD 04/04/16 [phone]

## 2016-04-04 NOTE — ED Triage Notes (Signed)
Patient reports elevated blood pressure at home this evening 228/111 with mild headache , denies nausea or dizziness.

## 2016-04-04 NOTE — ED Notes (Signed)
Dr. Claudine Mouton notified on pt.'s elevated blood pressure at triage , no order received .

## 2016-04-04 NOTE — ED Notes (Signed)
EDP at beside  

## 2016-04-05 ENCOUNTER — Encounter: Payer: Self-pay | Admitting: Cardiology

## 2016-04-05 ENCOUNTER — Ambulatory Visit (INDEPENDENT_AMBULATORY_CARE_PROVIDER_SITE_OTHER): Payer: Commercial Managed Care - HMO | Admitting: Cardiology

## 2016-04-05 VITALS — BP 138/78 | HR 80 | Ht 65.0 in | Wt 158.6 lb

## 2016-04-05 DIAGNOSIS — T783XXD Angioneurotic edema, subsequent encounter: Secondary | ICD-10-CM

## 2016-04-05 DIAGNOSIS — M21372 Foot drop, left foot: Secondary | ICD-10-CM

## 2016-04-05 DIAGNOSIS — I10 Essential (primary) hypertension: Secondary | ICD-10-CM | POA: Diagnosis not present

## 2016-04-05 DIAGNOSIS — I2510 Atherosclerotic heart disease of native coronary artery without angina pectoris: Secondary | ICD-10-CM | POA: Diagnosis not present

## 2016-04-05 MED ORDER — HYDRALAZINE HCL 25 MG PO TABS: 25.0000 mg | ORAL_TABLET | Freq: Two times a day (BID) | ORAL | 3 refills | Status: DC

## 2016-04-05 MED ORDER — CARVEDILOL 12.5 MG PO TABS: 12.5000 mg | ORAL_TABLET | Freq: Two times a day (BID) | ORAL | 3 refills | Status: DC

## 2016-04-05 MED ORDER — AMLODIPINE BESYLATE 10 MG PO TABS: 10.0000 mg | ORAL_TABLET | Freq: Every day | ORAL | 3 refills | Status: DC

## 2016-04-05 NOTE — Assessment & Plan Note (Signed)
On statin Rx-LDL 57 July 2017

## 2016-04-05 NOTE — Assessment & Plan Note (Addendum)
Pt seen in the office today with uncontrolled hypertension.

## 2016-04-05 NOTE — Progress Notes (Signed)
04/05/2016 Megan KISSELBURG   08/28/1940  [id number]  Primary Physician Aretta Nip, MD Primary Cardiologist: Dr Gwenlyn Found  HPI:  Pleasant 75 y/o AA female followed by Dr Gwenlyn Found with a history of moderate CAD, HTN, HLD, chronic LE edema. She had a cath in Aug 2012- 60% mLAD. Myoview in Nov 2012 was normal.  Echo in Dec 2014 was normal. She was admitted in Aug this year with angioedema felt to be secondary to ARB. She had to be intubated x 48 hrs to preserve her airway. During this time she developed a compression type nerve injury with subsequent Lt foot drop. This has been slowly improving with PT but she still requires a brace. Since her ARB was stopped she has had labile B/P readings. She went to the ED 04/04/16 with a B/P of 128/111. Norvasc 10 mg daily was added. She is in the office today for follow up. She brought reading from home with her. Her B/P is better with the addition of Norvasc but it is still elevated at times- 170/95 is an example. She denies any chest pain or unusual dyspnea.    Current Outpatient Prescriptions  Medication Sig Dispense Refill  . amLODipine (NORVASC) 10 MG tablet Take 1 tablet (10 mg total) by mouth daily. 90 tablet 3  . aspirin 81 MG tablet Take 162 mg by mouth daily.     . cetirizine (ZYRTEC) 10 MG tablet Take 10 mg by mouth daily as needed for allergies.    . diphenhydrAMINE (BENADRYL ALLERGY) 25 MG tablet Take 2 tablets by mouth as needed for anaphylaxis.    [name] EPINEPHrine 0.3 mg/0.3 mL IJ SOAJ injection Inject 0.3 mLs (0.3 mg total) into the muscle once as needed (anaphylaxis). 2 Device 0  . furosemide (LASIX) 20 MG tablet Take 1 tablet by mouth daily.    [name] gabapentin (NEURONTIN) 300 MG capsule Take 1 capsule (300 mg total) by mouth 3 (three) times daily. 90 capsule 11  . isosorbide mononitrate (IMDUR) 30 MG 24 hr tablet Take 1 tablet (30 mg total) by mouth daily. 30 tablet 1  . simvastatin (ZOCOR) 40 MG tablet Take 40 mg by mouth every 3 (three)  days.    . carvedilol (COREG) 12.5 MG tablet Take 1 tablet (12.5 mg total) by mouth 2 (two) times daily. 180 tablet 3  . hydrALAZINE (APRESOLINE) 25 MG tablet Take 1 tablet (25 mg total) by mouth 2 (two) times daily. ONLY TAKE if top number is over 150 or bottom number is over 90 60 tablet 3   No current facility-administered medications for this visit.     Allergies  Allergen Reactions  . Nexium [Esomeprazole Magnesium] Anaphylaxis    Pt states she was unable to swallow for several hours after taking the nexium  . Other Hives, Shortness Of Breath and Other (See Comments)    Red Fish (Hives) Bojangle's chicken (swelling and shortness of breath) PAPRIKA  . Codeine Other (See Comments)    REACTION: chest pain  . Ace Inhibitors Swelling    Angioedema   . Monosodium Glutamate Other (See Comments)    dizziness  . Shellfish Allergy Hives  . Tape Rash    Coban wrap turned her arm red (after stress test)    Social History   Social History  . Marital status: Single    Spouse name: N/A  . Number of children: 0  . Years of education: 12+   Occupational History  . [employer]    Social  History Main Topics  . Smoking status: Former Smoker    Packs/day: 0.25    Years: 15.00    Types: Cigarettes    Quit date: 02/04/2016  . Smokeless tobacco: Never Used  . Alcohol use 12.6 oz/week    7 Shots of liquor, 14 Standard drinks or equivalent per week     Comment: every night scotch  . Drug use: No  . Sexual activity: Not on file   Other Topics Concern  . Not on file   Social History Narrative   Lives with sister   Caffeine use: 2 cups coffee per day (mixed decaf/regular)     Review of Systems: General: negative for chills, fever, night sweats or weight changes.  Cardiovascular: negative for chest pain, dyspnea on exertion, edema, orthopnea, palpitations, paroxysmal nocturnal dyspnea or shortness of breath Dermatological: negative for rash Respiratory: negative for cough or  wheezing Urologic: negative for hematuria Abdominal: negative for nausea, vomiting, diarrhea, bright red blood per rectum, melena, or hematemesis Neurologic: negative for visual changes, syncope, or dizziness All other systems reviewed and are otherwise negative except as noted above.    Blood pressure 138/78, pulse 80, height 5\' 5"  (1.651 m), weight 158 lb 9.6 oz (71.9 kg).  General appearance: alert, cooperative and no distress Neck: no carotid bruit and no JVD Lungs: clear to auscultation bilaterally Heart: regular rate and rhythm Extremities: LLE brace in place, no edema RLE, trace on LLE Skin: Skin color, texture, turgor normal. No rashes or lesions Neurologic: Grossly normal   ASSESSMENT AND PLAN:   Essential hypertension, malignant Pt seen in the office today with uncontrolled hypertension.  Angioedema Admitted with angioedema secondary to ARB in Aug 2017- required 48 hrs of intubation  Coronary artery disease CAD- 60% mLAD Aug 2012  Hyperlipidemia On statin Rx-LDL 57 July 2017  Left foot drop After her hospitalization in Aug 2017- felt to be positional nerve compression while intubated   PLAN  I suggested we change her Lopressor to Coreg 12.5 mg BID as it may provide better B/P control than metoprolol 25 mg BID. I'm concerned she may develop LE edema on Amlodipine but she is doing OK with it so far (only had two doses). LE edema has been an issue with her in the past. I also explained it may to a week for her to get the full B/P lowering benifit from Amlodipine.   She is going out of town for a week or so and I gave her an Rx for Hydralazine 25 mg BID PRN for systolic B/P > 0000000, or diastolic > 90. I'll see her back in f/u in 3 weeks. She knows to call the office if she has any problems with her medications. I did suggest a f/u with our pharmacist but she felt more comfortable having just one provider see her and make changes if needed.   Kerin Ransom  PA-C 04/05/2016 1:05 PM

## 2016-04-05 NOTE — Patient Instructions (Signed)
Medication Instructions:  STOP Lopressor (metoprolol) START Coreg( Carvedilol) 12.5 mg take 1 tab by mouth twice a day START Hydralazine 25mg  Take 1 tablet twice a day ONLY IF top number of blood pressure is OVER 150 or if bottom number is OVER 90.  Labwork: None   Testing/Procedures: None   Follow-Up: Your physician recommends that you schedule a follow-up appointment in: April 25, 2016 with Kerin Ransom, PA  Any Other Special Instructions Will Be Listed Below (If Applicable).    If you need a refill on your cardiac medications before your next appointment, please call your pharmacy.

## 2016-04-05 NOTE — Assessment & Plan Note (Addendum)
After her hospitalization in Aug 2017- felt to be positional nerve compression while intubated

## 2016-04-05 NOTE — Assessment & Plan Note (Signed)
CAD- 60% mLAD Aug 2012

## 2016-04-05 NOTE — Assessment & Plan Note (Signed)
Admitted with angioedema secondary to ARB in Aug 2017- required 48 hrs of intubation

## 2016-04-06 ENCOUNTER — Ambulatory Visit: Payer: Commercial Managed Care - HMO | Admitting: Physical Therapy

## 2016-04-07 ENCOUNTER — Ambulatory Visit: Payer: Commercial Managed Care - HMO | Admitting: Rehabilitation

## 2016-04-07 ENCOUNTER — Encounter: Payer: Self-pay | Admitting: Rehabilitation

## 2016-04-07 DIAGNOSIS — R2681 Unsteadiness on feet: Secondary | ICD-10-CM

## 2016-04-07 DIAGNOSIS — R2689 Other abnormalities of gait and mobility: Secondary | ICD-10-CM | POA: Diagnosis not present

## 2016-04-07 DIAGNOSIS — M21372 Foot drop, left foot: Secondary | ICD-10-CM | POA: Diagnosis not present

## 2016-04-07 NOTE — Therapy (Signed)
[facility] [address] [place], [place], [address] Phone: [phone]   Fax:  [phone]  Physical Therapy Treatment  Patient Details  Name: Megan Hull MRN: [id number] Date of Birth: 22-Jan-1941 Referring Provider: Sarina Ill, MD  Encounter Date: 04/07/2016      PT End of Session - 04/07/16 1054    Visit Number 4   Number of Visits 14  updated POC   Date for PT Re-Evaluation 05/29/16  updated POC   Authorization Type [employer] MCR-G code every 10th visit   PT Start Time 1005   PT Stop Time 1050   PT Time Calculation (min) 45 min   Activity Tolerance Patient tolerated treatment well   Behavior During Therapy [facility] for tasks assessed/performed      Past Medical History:  Diagnosis Date  . Arthritis   . Diabetes mellitus without complication (HCC)    borderline  . Diarrhea   . Diverticulosis   . Dysrhythmia    controlled with Metoprolol-12-19-13 LOV -Dr. Gwenlyn Found sees yearly  . Family history of premature CAD   . GERD (gastroesophageal reflux disease)   . Heart murmur    yrs ago  . HLD (hyperlipidemia)   . Hypertension   . Lower extremity edema   . Status post dilation of esophageal narrowing   . Supraventricular arrhythmia   . Tobacco abuse     Past Surgical History:  Procedure Laterality Date  . ANTERIOR CERVICAL DECOMP/DISCECTOMY FUSION N/A 06/08/2015   Procedure: CERVICAL FIVE-SIX ,CERVICAL SIX-SEVEN ANTERIOR CERVICAL DECOMPRESSION/DISCECTOMY FUSION PLATING BONEGRAFT;  Surgeon: Kristeen Miss, MD;  Location: [facility];  Service: Neurosurgery;  Laterality: N/A;  . 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 Adora Fridge)  . COLONOSCOPY    . ESOPHAGEAL DILATION  2014  . infected lymph node surgery  80's  . NM MYOCAR PERF WALL MOTION  04/2011   bruce myoview; normal pattern of perfusion, EF 80%, low risk scan  . TONSILLECTOMY  70's  . TRANSTHORACIC  ECHOCARDIOGRAM  12/2007   mild conc LVH    There were no vitals filed for this visit.      Subjective Assessment - 04/07/16 1007    Subjective Note that she went to ED regarding BP being elevated, also went to cardiologist and got on new meds, now BP well controlled.    Patient is accompained by: Family member   Limitations Walking;House hold activities   Currently in Pain? No/denies   Pain Score 8   states restrictive pain   Pain Location Calf   Pain Orientation Left   Pain Descriptors / Indicators Tightness   Pain Type Acute pain   Pain Onset More than a month ago   Pain Frequency Intermittent   Aggravating Factors  when walking   Pain Relieving Factors rest           Self Care:  Discussed PRAFO for L foot positioning at night due to increased tightness in the morning and increased pain with gait.  PT to follow up and ask MD for order and contact [facility] regarding this matter.  Educated and demonstrated how to perform ice massage for pain relief due to muscle tightness during session, see pt instruction for details.    NMR;  Performed cone tapping task with side stepping x 8 reps alternating LEs with focus on improving L lateral weight shift and hip/knee control during L stance.  Provided facilitation for this with min A  during task.  Progressed to tipping cone over and back upright to increase time spent in SLS, esp LLE.  Again, provided facilitation for improved weight shift onto LLE.  Leg press machine with BLEs with 60lb x 15 reps with cues for slow controlled movement, esp when extension as to not lockout L knee into extension.  Then performed with LLE only x 15 reps, again with cues for controlled movement and maintaining alignment of LLE as she tends to adduct LLE.  Performed stairs without UE support to ascend in alternating pattern for LLE strengthening and forced use.  When descending had pt perform step to pattern leading with LLE with facilitation and cues for improved L  weight shift as she tends to remain posteriorly shifted and relies on UE support when descending stairs.  Performed 4 steps x 2 reps during session.  Educated on how to perform at home with cane and wall for safety but using UEs lightly.                          PT Short Term Goals - 04/03/16 1137      PT SHORT TERM GOAL #1   Title Will assess BERG balance test in order to set LTG and indicate improved balance (Target Date: 03/23/16)   Time 2   Period Weeks   Status On-going           PT Long Term Goals - 04/03/16 1136      PT LONG TERM GOAL #1   Title Pt will be independent with HEP in order to indicate improved functional mobility and decreased fall risk.  (Modified Target Date: 05/29/16)   Time 4   Period Weeks   Status New     PT LONG TERM GOAL #2   Title Will assess varying braces in order to indicate improved safety due to L foot drop.     Time 4   Period Weeks   Status New     PT LONG TERM GOAL #3   Title Pt will ambulate 300' over varying indoor surfaces without AD at mod I level (with brace as needed) in order to indicate improved safety at home.    Time 4   Period Weeks   Status New     PT LONG TERM GOAL #4   Title Pt will improve BERG balance test 4 points from baseline with use of AFO/brace in order to indicate decreased fall risk.     Time 4   Period Weeks   Status New     PT LONG TERM GOAL #5   Title Pt will improve gait speed to 2.33 ft/sec w/ LRAD and bracing as needed in order to indicate decreased fall risk and improved efficency of gait.    Time 4   Period Weeks   Status New               Plan - 04/07/16 1054    Clinical Impression Statement Skilled session focused on LLE strength, increased awareness of LLE with balance and weight shifting onto LLE during functional activities and discussion of ice massage for calf tightness/soreness and PRAFO for positioning at night.    Rehab Potential Good   Clinical Impairments  Affecting Rehab Potential high co-pay, unsure of prognosis of nerve damage.    PT Frequency 2x / week   PT Duration 8 weeks  will likely only need 4 weeks more of PT   PT Treatment/Interventions  ADLs/Self Care Home Management;Electrical Stimulation;DME Instruction;Gait training;Stair training;Functional mobility training;Therapeutic activities;Therapeutic exercise;Balance training;Neuromuscular re-education;Patient/family education;Orthotic Fit/Training   PT Next Visit Plan BERG!!  LLE NMR, forced use   Consulted and Agree with Plan of Care Patient;Family member/caregiver   Family Member Consulted sister      Patient will benefit from skilled therapeutic intervention in order to improve the following deficits and impairments:  Abnormal gait, Decreased balance, Decreased coordination, Decreased knowledge of use of DME, Decreased mobility, Decreased strength, Increased edema, Impaired flexibility, Improper body mechanics, Postural dysfunction, Pain  Visit Diagnosis: Unsteadiness on feet  Other abnormalities of gait and mobility  Foot drop, left     Problem List Patient Active Problem List   Diagnosis Date Noted  . Left leg weakness 02/10/2016  . Left foot drop 02/10/2016  . DJD (degenerative joint disease) 01/26/2016  . Acute respiratory failure with hypoxia ([place]) 01/22/2016  . Angioedema 01/21/2016  . Cervical spondylosis with myelopathy and radiculopathy 06/08/2015  . Coronary artery disease 12/18/2014  . Esophageal stricture 04/23/2014  . Lower extremity numbness 08/11/2013  . Essential hypertension, malignant 05/15/2013  . Hyperlipidemia 05/15/2013  . Dyspnea on exertion 05/15/2013  . Lower extremity edema 05/15/2013  . Diarrhea 02/12/2013  . Dyspepsia and other specified disorders of function of stomach 09/23/2012  . Esophageal reflux 01/20/2010  . Dysphagia 01/20/2010    Denice Bors 04/07/2016, 10:56 AM  [facility] [address] [place], [place], [address] Phone: [phone]   Fax:  [phone]  Name: Megan Hull MRN: [id number] Date of Birth: 1940/08/29

## 2016-04-07 NOTE — Patient Instructions (Signed)
   Ice massage:  Freeze water in styrofoam cup and then when frozen, peel back enough cup so that ice is showing and massage (to tolerance).  Do for about 5 mins.  Do this 2 times per day.  Continue to do stretches.

## 2016-04-10 ENCOUNTER — Encounter: Payer: Self-pay | Admitting: *Deleted

## 2016-04-10 ENCOUNTER — Other Ambulatory Visit: Payer: Self-pay | Admitting: Family Medicine

## 2016-04-10 ENCOUNTER — Telehealth: Payer: Self-pay | Admitting: Rehabilitation

## 2016-04-10 ENCOUNTER — Ambulatory Visit: Payer: Commercial Managed Care - HMO | Admitting: Physical Therapy

## 2016-04-10 DIAGNOSIS — G5702 Lesion of sciatic nerve, left lower limb: Secondary | ICD-10-CM

## 2016-04-10 DIAGNOSIS — M21372 Foot drop, left foot: Secondary | ICD-10-CM

## 2016-04-10 DIAGNOSIS — R2689 Other abnormalities of gait and mobility: Secondary | ICD-10-CM | POA: Diagnosis not present

## 2016-04-10 DIAGNOSIS — R29898 Other symptoms and signs involving the musculoskeletal system: Secondary | ICD-10-CM

## 2016-04-10 DIAGNOSIS — Z1231 Encounter for screening mammogram for malignant neoplasm of breast: Secondary | ICD-10-CM

## 2016-04-10 DIAGNOSIS — R2681 Unsteadiness on feet: Secondary | ICD-10-CM | POA: Diagnosis not present

## 2016-04-10 NOTE — Progress Notes (Signed)
Patient stopped in office today. She has PT next door at 11am. She wanted to let Dr Jaynee Eagles know she can move her big toes. Not her ankles yet, but she was "happy" to tell her she can move her big toes bilaterally. Advised pt I will give Dr Jaynee Eagles the message. She verbalized understanding.

## 2016-04-10 NOTE — Therapy (Signed)
[facility] [address] [place], [place], [address] Phone: [phone]   Fax:  [phone]  Physical Therapy Treatment  Patient Details  Name: Megan Hull MRN: [id number] Date of Birth: 1940-12-11 Referring Provider: Sarina Ill, MD  Encounter Date: 04/10/2016      PT End of Session - 04/10/16 1343    Visit Number 5   Number of Visits 14  updated POC   Date for PT Re-Evaluation 05/29/16  updated POC   Authorization Type [employer] MCR-G code every 10th visit   PT Start Time 1100   PT Stop Time 1145   PT Time Calculation (min) 45 min   Activity Tolerance Patient tolerated treatment well   Behavior During Therapy [facility] for tasks assessed/performed      Past Medical History:  Diagnosis Date  . Arthritis   . Diabetes mellitus without complication (HCC)    borderline  . Diarrhea   . Diverticulosis   . Dysrhythmia    controlled with Metoprolol-12-19-13 LOV -Dr. Gwenlyn Found sees yearly  . Family history of premature CAD   . GERD (gastroesophageal reflux disease)   . Heart murmur    yrs ago  . HLD (hyperlipidemia)   . Hypertension   . Lower extremity edema   . Status post dilation of esophageal narrowing   . Supraventricular arrhythmia   . Tobacco abuse     Past Surgical History:  Procedure Laterality Date  . ANTERIOR CERVICAL DECOMP/DISCECTOMY FUSION N/A 06/08/2015   Procedure: CERVICAL FIVE-SIX ,CERVICAL SIX-SEVEN ANTERIOR CERVICAL DECOMPRESSION/DISCECTOMY FUSION PLATING BONEGRAFT;  Surgeon: Kristeen Miss, MD;  Location: [facility];  Service: Neurosurgery;  Laterality: N/A;  . 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 Adora Fridge)  . COLONOSCOPY    . ESOPHAGEAL DILATION  2014  . infected lymph node surgery  80's  . NM MYOCAR PERF WALL MOTION  04/2011   bruce myoview; normal pattern of perfusion, EF 80%, low risk scan  . TONSILLECTOMY  70's  . TRANSTHORACIC  ECHOCARDIOGRAM  12/2007   mild conc LVH    There were no vitals filed for this visit.      Subjective Assessment - 04/10/16 1102    Subjective Pt says that she is able to move her toes a little on left foot.   Patient is accompained by: Family member   Limitations Walking;House hold activities   Currently in Pain? No/denies   Pain Onset More than a month ago              NEUROMUSCULAR RE-EDUCATION: Pt has progressed slightly with L ankle AROM in plantar flexion And dorsiflexion.  Discussed with primary PT and attempted NMES for L ankle dorsiflexors and plantarflexors: Pt felt e-stim sensation but had no ROM to the intensity that pt could tolerate.            [facility] Adult PT Treatment/Exercise - 04/10/16 0001      Exercises   Exercises Knee/Hip     Knee/Hip Exercises: Prone   Hamstring Curl 2 sets;10 reps  pt able to perform if L knee is partially bent initially   Hip Extension AROM;Left;2 sets;10 reps  Assist to maintain bent knee.                PT Education - 04/10/16 1341    Education provided Yes   Education Details Updated HEP with LLE strengthening exercises.   Person(s) Educated Patient   Methods Explanation;Demonstration;Tactile cues;Handout;Verbal cues  Comprehension Verbalized understanding;Returned demonstration;Need further instruction;Verbal cues required          PT Short Term Goals - 04/03/16 1137      PT SHORT TERM GOAL #1   Title Will assess BERG balance test in order to set LTG and indicate improved balance (Target Date: 03/23/16)   Time 2   Period Weeks   Status On-going           PT Long Term Goals - 04/03/16 1136      PT LONG TERM GOAL #1   Title Pt will be independent with HEP in order to indicate improved functional mobility and decreased fall risk.  (Modified Target Date: 05/29/16)   Time 4   Period Weeks   Status New     PT LONG TERM GOAL #2   Title Will assess varying braces in order to indicate improved  safety due to L foot drop.     Time 4   Period Weeks   Status New     PT LONG TERM GOAL #3   Title Pt will ambulate 300' over varying indoor surfaces without AD at mod I level (with brace as needed) in order to indicate improved safety at home.    Time 4   Period Weeks   Status New     PT LONG TERM GOAL #4   Title Pt will improve BERG balance test 4 points from baseline with use of AFO/brace in order to indicate decreased fall risk.     Time 4   Period Weeks   Status New     PT LONG TERM GOAL #5   Title Pt will improve gait speed to 2.33 ft/sec w/ LRAD and bracing as needed in order to indicate decreased fall risk and improved efficency of gait.    Time 4   Period Weeks   Status New               Plan - 04/10/16 1344    Clinical Impression Statement Trialled NMES for LLE dorsiflexors and plantarflexors: pt felt sensations with both but no ROM.  Updatead HEP to include LLE strengthening; pt is able to perform partial AROM for hamstring curl in prone.   Rehab Potential Good   Clinical Impairments Affecting Rehab Potential high co-pay, unsure of prognosis of nerve damage.    PT Frequency 2x / week   PT Duration 8 weeks  will likely only need 4 weeks more of PT   PT Treatment/Interventions ADLs/Self Care Home Management;Electrical Stimulation;DME Instruction;Gait training;Stair training;Functional mobility training;Therapeutic activities;Therapeutic exercise;Balance training;Neuromuscular re-education;Patient/family education;Orthotic Fit/Training   PT Next Visit Plan BERG!!  LLE strengthening, NMR, forced use   Consulted and Agree with Plan of Care Patient;Family member/caregiver   Family Member Consulted sister      Patient will benefit from skilled therapeutic intervention in order to improve the following deficits and impairments:  Abnormal gait, Decreased balance, Decreased coordination, Decreased knowledge of use of DME, Decreased mobility, Decreased strength,  Increased edema, Impaired flexibility, Improper body mechanics, Postural dysfunction, Pain  Visit Diagnosis: Unsteadiness on feet  Other abnormalities of gait and mobility  Foot drop, left     Problem List Patient Active Problem List   Diagnosis Date Noted  . Left leg weakness 02/10/2016  . Left foot drop 02/10/2016  . DJD (degenerative joint disease) 01/26/2016  . Acute respiratory failure with hypoxia ([place]) 01/22/2016  . Angioedema 01/21/2016  . Cervical spondylosis with myelopathy and radiculopathy 06/08/2015  . Coronary artery disease  12/18/2014  . Esophageal stricture 04/23/2014  . Lower extremity numbness 08/11/2013  . Essential hypertension, malignant 05/15/2013  . Hyperlipidemia 05/15/2013  . Dyspnea on exertion 05/15/2013  . Lower extremity edema 05/15/2013  . Diarrhea 02/12/2013  . Dyspepsia and other specified disorders of function of stomach 09/23/2012  . Esophageal reflux 01/20/2010  . Dysphagia 01/20/2010    Bjorn Loser, PTA  04/10/16, 1:56 PM [facility] [address] [place], [place], [address] Phone: [phone]   Fax:  [phone]  Name: AUDENE SHUMWAY MRN: [id number] Date of Birth: 06-11-1941

## 2016-04-10 NOTE — Telephone Encounter (Signed)
Dr. Jaynee Eagles,   I am seeing Megan Hull at [facility] neuro for PT.  Note that she is having a lot of tightness in L calf despite stretches and feel that it is likely due to poor positioning at night.  Would like to get her a PRAFO for correct positioning.  Please write order in EPIC if you agree and I will notify [facility].    Thanks,  Cameron Sprang, PT, MPT [facility] [address] [place], [place], [address] Phone: [phone]   Fax:  [phone] 04/10/16, 7:29 AM

## 2016-04-10 NOTE — Telephone Encounter (Signed)
Would you mind doing this Megan Hull please? thanks

## 2016-04-10 NOTE — Patient Instructions (Addendum)
Leg Curl: Prone (Single Leg)    Lying on stomach,  Bend knee and pull heel up Repeat _10x2_ times per set. Repeat with other leg.  1-2x/day  http://tub.exer.us/200   Copyright  VHI. All rights reserved.  EXTENSION: Prone - Knee Flexed (Active)    Lie on stomach, Left knee bent to 90. Lift leg toward ceiling. Complete _2__ sets of _10__ repetitions. Perform _1-2__ sessions per day.  http://gtsc.exer.us/67   Copyright  VHI. All rights reserved.

## 2016-04-10 NOTE — Telephone Encounter (Signed)
Placed order in EPIC per Dr Jaynee Eagles request.

## 2016-04-13 DIAGNOSIS — T783XXA Angioneurotic edema, initial encounter: Secondary | ICD-10-CM | POA: Diagnosis not present

## 2016-04-14 ENCOUNTER — Ambulatory Visit: Payer: Commercial Managed Care - HMO | Attending: Neurology | Admitting: Rehabilitation

## 2016-04-14 ENCOUNTER — Encounter: Payer: Self-pay | Admitting: Rehabilitation

## 2016-04-14 DIAGNOSIS — R2681 Unsteadiness on feet: Secondary | ICD-10-CM | POA: Insufficient documentation

## 2016-04-14 DIAGNOSIS — R2689 Other abnormalities of gait and mobility: Secondary | ICD-10-CM | POA: Diagnosis not present

## 2016-04-14 DIAGNOSIS — M21372 Foot drop, left foot: Secondary | ICD-10-CM

## 2016-04-14 NOTE — Therapy (Signed)
[facility] [address] [place], [place], [address] Phone: [phone]   Fax:  [phone]  Physical Therapy Treatment  Patient Details  Name: Megan Hull MRN: [id number] Date of Birth: 1940-11-08 Referring Provider: Sarina Ill, MD  Encounter Date: 04/14/2016      PT End of Session - 04/14/16 1030    Visit Number 6   Number of Visits 14  updated POC   Date for PT Re-Evaluation 05/29/16  updated POC   Authorization Type [employer] MCR-G code every 10th visit   PT Start Time 1016   PT Stop Time 1103   PT Time Calculation (min) 47 min   Activity Tolerance Patient tolerated treatment well   Behavior During Therapy [facility] for tasks assessed/performed      Past Medical History:  Diagnosis Date  . Arthritis   . Diabetes mellitus without complication (HCC)    borderline  . Diarrhea   . Diverticulosis   . Dysrhythmia    controlled with Metoprolol-12-19-13 LOV -Dr. Gwenlyn Found sees yearly  . Family history of premature CAD   . GERD (gastroesophageal reflux disease)   . Heart murmur    yrs ago  . HLD (hyperlipidemia)   . Hypertension   . Lower extremity edema   . Status post dilation of esophageal narrowing   . Supraventricular arrhythmia   . Tobacco abuse     Past Surgical History:  Procedure Laterality Date  . ANTERIOR CERVICAL DECOMP/DISCECTOMY FUSION N/A 06/08/2015   Procedure: CERVICAL FIVE-SIX ,CERVICAL SIX-SEVEN ANTERIOR CERVICAL DECOMPRESSION/DISCECTOMY FUSION PLATING BONEGRAFT;  Surgeon: Kristeen Miss, MD;  Location: [place] [facility];  Service: Neurosurgery;  Laterality: N/A;  . 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 Adora Fridge)  . COLONOSCOPY    . ESOPHAGEAL DILATION  2014  . infected lymph node surgery  80's  . NM MYOCAR PERF WALL MOTION  04/2011   bruce myoview; normal pattern of perfusion, EF 80%, low risk scan  . TONSILLECTOMY  70's  . TRANSTHORACIC  ECHOCARDIOGRAM  12/2007   mild conc LVH    There were no vitals filed for this visit.      Subjective Assessment - 04/14/16 1028    Subjective Pt reports still having tightness in L calf.    Patient is accompained by: Family member   Limitations Walking;House hold activities   Currently in Pain? No/denies                         [facility] Adult PT Treatment/Exercise - 04/14/16 0001      Ambulation/Gait   Ambulation/Gait Yes   Ambulation/Gait Assistance 5: Supervision   Ambulation/Gait Assistance Details Assessed gait over varying outdoor surfaces with use of L AFO.  Pt able to ambulate at S level over grass, uphill/downhill, up and down curb.  No overt LOB however did provide cues for improved gait pattern.     Ambulation Distance (Feet) 1000 Feet   Assistive device None   Gait Pattern Step-through pattern;Left steppage;Left foot flat;Poor foot clearance - left   Ambulation Surface Level;Unlevel;Indoor;Outdoor;Paved;Grass;Other (comment)  uphill downhill    Curb 6: Modified independent (Device/increase time)     Self-Care   Self-Care Other Self-Care Comments   Other Self-Care Comments  Discussed possibly getting PRAFO (contacted Chris during session) today, and that PT would let her know if she could pick it up.  Pt with questions regarding purchasing a different brace (for plantar fasciitis,  but keeps foot up) at night.  Educated that she could try but she would have to purchase this on her own and could still wait for brace as it is hopefully covered by insurance.       Neuro Re-ed    Neuro Re-ed Details  corner balance tasks, see pt instruction for details.  Also performed L SLS with mild UE support x 3 reps x 15 secs each.  Cues for how to progress.                  PT Education - 04/14/16 1213    Education provided Yes   Education Details Additions to HEP, see pt instruction, PT to call pt to pick up PRAFO if availabe today.     Person(s) Educated  Patient   Methods Explanation   Comprehension Verbalized understanding          PT Short Term Goals - 04/14/16 1215      PT SHORT TERM GOAL #1   Title Will assess BERG balance test in order to set LTG and indicate improved balance (Target Date: 03/23/16)   Baseline Pt is at high level with balance and will perform DGI and set goal appropriately.    Time 2   Period Weeks   Status Deferred           PT Long Term Goals - 04/03/16 1136      PT LONG TERM GOAL #1   Title Pt will be independent with HEP in order to indicate improved functional mobility and decreased fall risk.  (Modified Target Date: 05/29/16)   Time 4   Period Weeks   Status New     PT LONG TERM GOAL #2   Title Will assess varying braces in order to indicate improved safety due to L foot drop.     Time 4   Period Weeks   Status New     PT LONG TERM GOAL #3   Title Pt will ambulate 300' over varying indoor surfaces without AD at mod I level (with brace as needed) in order to indicate improved safety at home.    Time 4   Period Weeks   Status New     PT LONG TERM GOAL #4   Title Pt will improve BERG balance test 4 points from baseline with use of AFO/brace in order to indicate decreased fall risk.     Time 4   Period Weeks   Status New     PT LONG TERM GOAL #5   Title Pt will improve gait speed to 2.33 ft/sec w/ LRAD and bracing as needed in order to indicate decreased fall risk and improved efficency of gait.    Time 4   Period Weeks   Status New               Plan - 04/14/16 1030    Clinical Impression Statement Skilled session focused on additions of balance exercises to HEP (also for ankle strength), gait over varying outdoor surfaces, SLS for NMR in LLE and discussion regarding PRAFO vs off the shelf brace she could purchase.     Rehab Potential Good   Clinical Impairments Affecting Rehab Potential high co-pay, unsure of prognosis of nerve damage.    PT Frequency 2x / week   PT  Duration 8 weeks  will likely only need 4 weeks more of PT   PT Treatment/Interventions ADLs/Self Care Home Management;Electrical Stimulation;DME Instruction;Gait training;Stair training;Functional mobility training;Therapeutic activities;Therapeutic exercise;Balance  training;Neuromuscular re-education;Patient/family education;Orthotic Fit/Training   PT Next Visit Plan DGI Raquel Sarna will replace BERG goal-I think she is too high level for BERG), L NMR-forced use, hamstring strengthening   Consulted and Agree with Plan of Care Patient   Family Member Consulted --      Patient will benefit from skilled therapeutic intervention in order to improve the following deficits and impairments:  Abnormal gait, Decreased balance, Decreased coordination, Decreased knowledge of use of DME, Decreased mobility, Decreased strength, Increased edema, Impaired flexibility, Improper body mechanics, Postural dysfunction, Pain  Visit Diagnosis: Left foot drop  Unsteadiness on feet  Other abnormalities of gait and mobility     Problem List Patient Active Problem List   Diagnosis Date Noted  . Left leg weakness 02/10/2016  . Left foot drop 02/10/2016  . DJD (degenerative joint disease) 01/26/2016  . Acute respiratory failure with hypoxia ([place]) 01/22/2016  . Angioedema 01/21/2016  . Cervical spondylosis with myelopathy and radiculopathy 06/08/2015  . Coronary artery disease 12/18/2014  . Esophageal stricture 04/23/2014  . Lower extremity numbness 08/11/2013  . Essential hypertension, malignant 05/15/2013  . Hyperlipidemia 05/15/2013  . Dyspnea on exertion 05/15/2013  . Lower extremity edema 05/15/2013  . Diarrhea 02/12/2013  . Dyspepsia and other specified disorders of function of stomach 09/23/2012  . Esophageal reflux 01/20/2010  . Dysphagia 01/20/2010    Cameron Sprang, PT, MPT [facility] [address] [place], [place], [address] Phone: [phone]    Fax:  [phone] 04/14/16, 12:22 PM  Name: CATHE KOLLMANN MRN: [id number] Date of Birth: 12/05/1940

## 2016-04-14 NOTE — Patient Instructions (Signed)
Feet Together (Compliant Surface) Arm Motion - Eyes Closed    Stand on compliant surface: ___pillow or cushion_____ with feet together. Close eyes and keep arms by your side.  Repeat __3__ times per session for 30 seconds each. Do _2___ sessions per day.  Copyright  VHI. All rights reserved.    Feet Together (Compliant Surface) Head Motion - Eyes Open    With eyes open, standing on compliant surface: __pillow or cushion____, feet together, move head slowly: up and down x 10 reps, and side to side x 10 reps.  Repeat __1__ times per session. Do __1-2__ sessions per day.  Copyright  VHI. All rights reserved.

## 2016-04-18 ENCOUNTER — Encounter: Payer: Self-pay | Admitting: Cardiology

## 2016-04-18 ENCOUNTER — Encounter: Payer: Self-pay | Admitting: *Deleted

## 2016-04-18 NOTE — Progress Notes (Signed)
Faxed signed orders by AA, MD for static Afo, prefab-custom fit back to [facility]. Fax: [phone]. Received confirmation.

## 2016-04-24 ENCOUNTER — Encounter: Payer: Self-pay | Admitting: Physical Therapy

## 2016-04-24 ENCOUNTER — Ambulatory Visit: Payer: Commercial Managed Care - HMO | Admitting: Physical Therapy

## 2016-04-24 VITALS — BP 114/63 | HR 73

## 2016-04-24 DIAGNOSIS — R2689 Other abnormalities of gait and mobility: Secondary | ICD-10-CM | POA: Diagnosis not present

## 2016-04-24 DIAGNOSIS — M21372 Foot drop, left foot: Secondary | ICD-10-CM | POA: Diagnosis not present

## 2016-04-24 DIAGNOSIS — R2681 Unsteadiness on feet: Secondary | ICD-10-CM | POA: Diagnosis not present

## 2016-04-24 NOTE — Therapy (Signed)
[facility] [address] [place], [place], [address] Phone: [phone]   Fax:  [phone]  Physical Therapy Treatment  Patient Details  Name: Megan Hull MRN: [id number] Date of Birth: 10/07/1940 Referring Provider: Sarina Ill, MD  Encounter Date: 04/24/2016      PT End of Session - 04/24/16 1523    Visit Number 7   Number of Visits 14  updated POC   Date for PT Re-Evaluation 05/29/16  updated POC   Authorization Type [employer] MCR-G code every 10th visit   PT Start Time 1315   PT Stop Time 1405   PT Time Calculation (min) 50 min   Equipment Utilized During Treatment Gait belt   Activity Tolerance Patient tolerated treatment well   Behavior During Therapy [facility] for tasks assessed/performed      Past Medical History:  Diagnosis Date  . Arthritis   . Diabetes mellitus without complication (HCC)    borderline  . Diarrhea   . Diverticulosis   . Dysrhythmia    controlled with Metoprolol-12-19-13 LOV -Dr. Gwenlyn Found sees yearly  . Family history of premature CAD   . GERD (gastroesophageal reflux disease)   . Heart murmur    yrs ago  . HLD (hyperlipidemia)   . Hypertension   . Lower extremity edema   . Status post dilation of esophageal narrowing   . Supraventricular arrhythmia   . Tobacco abuse     Past Surgical History:  Procedure Laterality Date  . ANTERIOR CERVICAL DECOMP/DISCECTOMY FUSION N/A 06/08/2015   Procedure: CERVICAL FIVE-SIX ,CERVICAL SIX-SEVEN ANTERIOR CERVICAL DECOMPRESSION/DISCECTOMY FUSION PLATING BONEGRAFT;  Surgeon: Kristeen Miss, MD;  Location: [place] NEUR[facility] ORS;  Service: Neurosurgery;  Laterality: N/A;  . 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 Adora Fridge)  . COLONOSCOPY    . ESOPHAGEAL DILATION  2014  . infected lymph node surgery  80's  . NM MYOCAR PERF WALL MOTION  04/2011   bruce myoview; normal pattern of perfusion, EF 80%, low risk scan  .  TONSILLECTOMY  70's  . TRANSTHORACIC ECHOCARDIOGRAM  12/2007   mild conc LVH    Vitals:   04/24/16 1327  BP: 114/63  Pulse: 73        Subjective Assessment - 04/24/16 1319    Subjective Pt reports swellng in the proximal calf just above the brace. She is very sore after wearing the brace for a couple days that she cannot wear it again the next day. She will be going to the cardiologist tomorrow to see if her medicine for BP needs to be adjusted. She also c/o feeling like her left leg is longer than the right now that she is using an AFO.   Limitations Walking;House hold activities   Currently in Pain? No/denies            [facility] PT Assessment - 04/24/16 0001      Standardized Balance Assessment   Standardized Balance Assessment Dynamic Gait Index     Dynamic Gait Index   Level Surface Normal   Change in Gait Speed Mild Impairment   Gait with Horizontal Head Turns Moderate Impairment   Gait with Vertical Head Turns Moderate Impairment   Gait and Pivot Turn Moderate Impairment  <3 seconds but LOB   Step Over Obstacle Normal   Step Around Obstacles Normal   Steps Mild Impairment   Total Score 16           [facility] Adult PT  Treatment/Exercise - 04/24/16 1516      Neuro Re-ed    Neuro Re-ed Details  Stepping over 2" black foam beam with R LE to encourage WB on L LE and challange balance; Demo and verbal cues for correct technique; min guard; 10 reps x3. Sit <> stand with 4" step under R LE to facilitate even weight distribution during transfer; VC for correct technique and posture; 10 reps; No UE support; supervision; Pt reported an increase in knee pain during activity.     Lumbar Exercises: Seated   Other Seated Lumbar Exercises Seated marches with focus on core stabilization;  20 reps x3; Demo and vc for correct technique;            PT Short Term Goals - 04/14/16 1215      PT SHORT TERM GOAL #1   Title Will assess BERG balance test in order to set LTG and indicate  improved balance (Target Date: 03/23/16)   Baseline Pt is at high level with balance and will perform DGI and set goal appropriately.    Time 2   Period Weeks   Status Deferred           PT Long Term Goals - 04/03/16 1136      PT LONG TERM GOAL #1   Title Pt will be independent with HEP in order to indicate improved functional mobility and decreased fall risk.  (Modified Target Date: 05/29/16)   Time 4   Period Weeks   Status New     PT LONG TERM GOAL #2   Title Will assess varying braces in order to indicate improved safety due to L foot drop.     Time 4   Period Weeks   Status New     PT LONG TERM GOAL #3   Title Pt will ambulate 300' over varying indoor surfaces without AD at mod I level (with brace as needed) in order to indicate improved safety at home.    Time 4   Period Weeks   Status New     PT LONG TERM GOAL #4   Title Pt will improve BERG balance test 4 points from baseline with use of AFO/brace in order to indicate decreased fall risk.     Time 4   Period Weeks   Status New     PT LONG TERM GOAL #5   Title Pt will improve gait speed to 2.33 ft/sec w/ LRAD and bracing as needed in order to indicate decreased fall risk and improved efficency of gait.    Time 4   Period Weeks   Status New           Plan - 04/24/16 1523    Clinical Impression Statement Todays skilled session focused on setting a baseline using the DGI, LE strengtheing, and balance activities. Patient presented with some swelling and pitting edema in the left proximal calf (just above AFO).    Rehab Potential Good   Clinical Impairments Affecting Rehab Potential high co-pay, unsure of prognosis of nerve damage.    PT Frequency 2x / week   PT Duration 8 weeks  will likely only need 4 weeks more of PT   PT Treatment/Interventions ADLs/Self Care Home Management;Electrical Stimulation;DME Instruction;Gait training;Stair training;Functional mobility training;Therapeutic activities;Therapeutic  exercise;Balance training;Neuromuscular re-education;Patient/family education;Orthotic Fit/Training   PT Next Visit Plan L NMR-forced use, hamstring strengthening, Discuss PRFAO   Consulted and Agree with Plan of Care Patient      Patient will benefit from skilled  therapeutic intervention in order to improve the following deficits and impairments:  Abnormal gait, Decreased balance, Decreased coordination, Decreased knowledge of use of DME, Decreased mobility, Decreased strength, Increased edema, Impaired flexibility, Improper body mechanics, Postural dysfunction, Pain  Visit Diagnosis: Left foot drop  Unsteadiness on feet  Other abnormalities of gait and mobility     Problem List Patient Active Problem List   Diagnosis Date Noted  . Left leg weakness 02/10/2016  . Left foot drop 02/10/2016  . DJD (degenerative joint disease) 01/26/2016  . Acute respiratory failure with hypoxia ([place]) 01/22/2016  . Angioedema 01/21/2016  . Cervical spondylosis with myelopathy and radiculopathy 06/08/2015  . Coronary artery disease 12/18/2014  . Esophageal stricture 04/23/2014  . Lower extremity numbness 08/11/2013  . Essential hypertension, malignant 05/15/2013  . Hyperlipidemia 05/15/2013  . Dyspnea on exertion 05/15/2013  . Lower extremity edema 05/15/2013  . Diarrhea 02/12/2013  . Dyspepsia and other specified disorders of function of stomach 09/23/2012  . Esophageal reflux 01/20/2010  . Dysphagia 01/20/2010    Benjiman Core, SPTA 04/24/2016, 3:40 PM  [facility] [address] [place], [place], [address] Phone: [phone]   Fax:  [phone]  Name: Megan Hull MRN: [id number] Date of Birth: 22-May-1941

## 2016-04-25 ENCOUNTER — Encounter (HOSPITAL_COMMUNITY): Payer: Self-pay

## 2016-04-25 ENCOUNTER — Ambulatory Visit (INDEPENDENT_AMBULATORY_CARE_PROVIDER_SITE_OTHER): Payer: Commercial Managed Care - HMO | Admitting: Cardiology

## 2016-04-25 ENCOUNTER — Ambulatory Visit (HOSPITAL_COMMUNITY)
Admission: RE | Admit: 2016-04-25 | Discharge: 2016-04-25 | Disposition: A | Discharge status: Home / Self Care | Payer: Commercial Managed Care - HMO | Source: Ambulatory Visit | Attending: Cardiovascular Disease | Admitting: Cardiovascular Disease

## 2016-04-25 ENCOUNTER — Encounter: Payer: Self-pay | Admitting: Cardiology

## 2016-04-25 DIAGNOSIS — R600 Localized edema: Secondary | ICD-10-CM

## 2016-04-25 DIAGNOSIS — I10 Essential (primary) hypertension: Secondary | ICD-10-CM

## 2016-04-25 DIAGNOSIS — M21372 Foot drop, left foot: Secondary | ICD-10-CM

## 2016-04-25 DIAGNOSIS — I2510 Atherosclerotic heart disease of native coronary artery without angina pectoris: Secondary | ICD-10-CM | POA: Diagnosis not present

## 2016-04-25 DIAGNOSIS — M7989 Other specified soft tissue disorders: Secondary | ICD-10-CM | POA: Diagnosis not present

## 2016-04-25 DIAGNOSIS — E669 Obesity, unspecified: Secondary | ICD-10-CM | POA: Diagnosis not present

## 2016-04-25 DIAGNOSIS — T783XXD Angioneurotic edema, subsequent encounter: Secondary | ICD-10-CM | POA: Insufficient documentation

## 2016-04-25 DIAGNOSIS — Z87891 Personal history of nicotine dependence: Secondary | ICD-10-CM | POA: Diagnosis not present

## 2016-04-25 NOTE — Assessment & Plan Note (Signed)
Much better control after adjustments at LOV

## 2016-04-25 NOTE — Assessment & Plan Note (Signed)
Lt LE edema-r/o DVT

## 2016-04-25 NOTE — Assessment & Plan Note (Signed)
On statin therapy with recent lipid profile performed by her PCP revealing a total dose of 151, LDL 75 HDL of 56

## 2016-04-25 NOTE — Assessment & Plan Note (Signed)
After her hospitalization in Aug 2017- felt to be positional nerve compression while intubated

## 2016-04-25 NOTE — Assessment & Plan Note (Signed)
CAD- 60% mLAD Aug 2012

## 2016-04-25 NOTE — Progress Notes (Addendum)
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 She had a cath in Aug 2012- 60% mLAD. Myoview in Nov 2012 was normal.  Echo in Dec 2014 showed an EF of 55-65% with normal PA pressures and grade 1 DD. She was admitted in Aug this year with angioedema secondary to ARB. This has subsequently been confirmed by allergy testing. She had to be intubated x 48 hrs to preserve her airway. During this time she developed a compression type nerve injury with subsequent Lt foot drop. This has been slowly improving with PT but she still requires a brace. Since her ARB was stopped she has had labile B/P readings. She went to the ED 04/04/16 with a B/P of 128/111. Norvasc 10 mg daily was added. I saw her in the office 04/05/16. At that time I changed her metoprolol to Coreg, and added Hydralazine prn. She is here today for follow up.  Since I saw her last she has been doing well, no unusual dyspnea. She had been on vacation in the [employer]. Her B/P has been under much better control though she has noticed increased LLE edema at the end of the day. She says it swells so much she has to take her brace off and its uncomfortable sore and tender. She has had edema in her LE in the past, Lt > Rt. Remote venous dopplers were negative but I think this should be repeated with her recent hospitalization and Lt LLE nerve injury requiring her to wear a brace.    Current Outpatient Prescriptions  Medication Sig Dispense Refill  . amLODipine (NORVASC) 10 MG tablet Take 1 tablet (10 mg total) by mouth daily. 90 tablet 3  . aspirin 81 MG tablet Take 162 mg by mouth daily.     . carvedilol (COREG) 12.5 MG tablet Take 1 tablet (12.5 mg total) by mouth 2 (two) times daily.  180 tablet 3  . cetirizine (ZYRTEC) 10 MG tablet Take 10 mg by mouth daily as needed for allergies.    . diphenhydrAMINE (BENADRYL ALLERGY) 25 MG tablet Take 2 tablets by mouth as needed for anaphylaxis.    [name] EPINEPHrine 0.3 mg/0.3 mL IJ SOAJ injection Inject 0.3 mLs (0.3 mg total) into the muscle once as needed (anaphylaxis). 2 Device 0  . furosemide (LASIX) 20 MG tablet Take 1 tablet by mouth daily.    . hydrALAZINE (APRESOLINE) 25 MG tablet Take 1 tablet (25 mg total) by mouth 2 (two) times daily. ONLY TAKE if top number is over 150 or bottom number is over 90 60 tablet 3  . isosorbide mononitrate (IMDUR) 30 MG 24 hr tablet Take 1 tablet (30 mg total) by mouth daily. 30 tablet 1  . simvastatin (ZOCOR) 40 MG tablet Take 40 mg by mouth every 3 (three) days.     No current facility-administered medications for this visit.     Allergies  Allergen Reactions  . Nexium [Esomeprazole Magnesium] Anaphylaxis    Pt states she was unable to swallow for several hours after taking the nexium  . Other Hives, Shortness Of Breath and Other (See Comments)    Red Fish (Hives) Bojangle's chicken (swelling and shortness of breath) PAPRIKA  . Codeine Other (See Comments)  REACTION: chest pain  . Ace Inhibitors Swelling    Angioedema   . Monosodium Glutamate Other (See Comments)    dizziness  . Shellfish Allergy Hives  . Tape Rash    Coban wrap turned her arm red (after stress test)    Social History   Social History  . Marital status: Single    Spouse name: N/A  . Number of children: 0  . Years of education: 12+   Occupational History  . [employer]    Social History Main Topics  . Smoking status: Former Smoker    Packs/day: 0.25    Years: 15.00    Types: Cigarettes    Quit date: 02/04/2016  . Smokeless tobacco: Never Used  . Alcohol use 12.6 oz/week    7 Shots of liquor, 14 Standard drinks or equivalent per week     Comment: every night scotch  . Drug use: No  . Sexual activity: Not  on file   Other Topics Concern  . Not on file   Social History Narrative   Lives with sister   Caffeine use: 2 cups coffee per day (mixed decaf/regular)     Review of Systems: General: negative for chills, fever, night sweats or weight changes.  Cardiovascular: negative for chest pain, dyspnea on exertion, edema, orthopnea, palpitations, paroxysmal nocturnal dyspnea or shortness of breath Dermatological: negative for rash Respiratory: negative for cough or wheezing. No hemoptysis Urologic: negative for hematuria Abdominal: negative for nausea, vomiting, diarrhea, bright red blood per rectum, melena, or hematemesis Neurologic: negative for visual changes, syncope, or dizziness All other systems reviewed and are otherwise negative except as noted above.    Blood pressure 120/66, pulse 82, height 5\' 5"  (1.651 m), weight 161 lb 12.8 oz (73.4 kg).  General appearance: alert, cooperative and no distress Neck: no JVD Lungs: clear to auscultation bilaterally Heart: regular rate and rhythm Extremities: 1+ LLE edema- brace and compression stockings in place. Skin: Skin color, texture, turgor normal. No rashes or lesions Neurologic: Grossly normal   ASSESSMENT AND PLAN:   Lower extremity edema Lt LE edema-r/o DVT  Essential hypertension, malignant Much better control after adjustments at LOV  Left foot drop After her hospitalization in Aug 2017- felt to be positional nerve compression while intubated  Angioedema Admitted with angioedema secondary to ARB in Aug 2017- required 48 hrs of intubation Subsequent allergy testing confirms angioedema from ARB/ACE also red meat allergy  Coronary artery disease CAD- 60% mLAD Aug 2012  Hyperlipidemia On statin therapy with recent lipid profile performed by her PCP revealing a total dose of 151, LDL 75 HDL of 56   PLAN  Check LE venous doppler to r/o DVT LLE. If negative then I think she has venous insufficiency. I have left it up to  her to whether or not she wants to try cutting her Amlodipine to 5 mg, knowing that her B/P may go up and that would require the addition of another medication- probably Hydralazine.   Kerin Ransom PA-C 04/25/2016 10:50 AM    Venous dopplers negative for DVT. I reviewed this with Megan Hull. For now she would like to continue with Amlodipine 10 mg as its working so well with her HTN. Her foot drop is slowly improving and she thinks her edema will improve once she doesn't have to wear her brace.  Kerin Ransom PA-C 04/25/2016 12:53 PM

## 2016-04-25 NOTE — Patient Instructions (Signed)
Medication Instructions:  Your physician recommends that you continue on your current medications as directed. Please refer to the Current Medication list given to you today.  Labwork: None   Testing/Procedures: Your physician has requested that you have a lower extremity venous duplex. This test is an ultrasound of the veins in the legs. It looks at venous blood flow that carries blood from the heart to the legs. Allow one hour for a Lower Venous exam. There are no restrictions or special instructions.  SCHEDULE TODAY AT 1:30PM-OK'D BY VASCULAR TECHS TO [place]  Follow-Up: Your physician recommends that you schedule a follow-up appointment in: [place].  Any Other Special Instructions Will Be Listed Below (If Applicable).     If you need a refill on your cardiac medications before your next appointment, please call your pharmacy.

## 2016-04-25 NOTE — Assessment & Plan Note (Signed)
Admitted with angioedema secondary to ARB in Aug 2017- required 48 hrs of intubation Subsequent allergy testing confirms angioedema from ARB/ACE also red meat allergy

## 2016-04-25 NOTE — Progress Notes (Signed)
Left lower extremity venous duplex is negative for DVT in the visualized veins. The left peroneal vein is not adequately visualized. Preliminary results given to [facility].

## 2016-04-27 ENCOUNTER — Ambulatory Visit: Payer: Commercial Managed Care - HMO | Admitting: Physical Therapy

## 2016-04-27 ENCOUNTER — Encounter: Payer: Self-pay | Admitting: Physical Therapy

## 2016-04-27 DIAGNOSIS — R2681 Unsteadiness on feet: Secondary | ICD-10-CM | POA: Diagnosis not present

## 2016-04-27 DIAGNOSIS — R2689 Other abnormalities of gait and mobility: Secondary | ICD-10-CM | POA: Diagnosis not present

## 2016-04-27 DIAGNOSIS — M21372 Foot drop, left foot: Secondary | ICD-10-CM | POA: Diagnosis not present

## 2016-04-27 NOTE — Therapy (Signed)
[facility] [address] [place], [place], [address] Phone: [phone]   Fax:  [phone]  Physical Therapy Treatment  Patient Details  Name: Megan Hull MRN: [id number] Date of Birth: 11-04-1940 Referring Provider: Sarina Ill, MD  Encounter Date: 04/27/2016      PT End of Session - 04/27/16 1257    Visit Number 8   Number of Visits 14   Date for PT Re-Evaluation 05/29/16   Authorization Type [employer] MCR-G code every 10th visit   PT Start Time 1100   PT Stop Time 1145   PT Time Calculation (min) 45 min   Activity Tolerance Patient tolerated treatment well   Behavior During Therapy [facility] for tasks assessed/performed      Past Medical History:  Diagnosis Date  . Arthritis   . Diabetes mellitus without complication (HCC)    borderline  . Diarrhea   . Diverticulosis   . Dysrhythmia    controlled with Metoprolol-12-19-13 LOV -Dr. Gwenlyn Found sees yearly  . Family history of premature CAD   . GERD (gastroesophageal reflux disease)   . Heart murmur    yrs ago  . HLD (hyperlipidemia)   . Hypertension   . Lower extremity edema   . Status post dilation of esophageal narrowing   . Supraventricular arrhythmia   . Tobacco abuse     Past Surgical History:  Procedure Laterality Date  . ANTERIOR CERVICAL DECOMP/DISCECTOMY FUSION N/A 06/08/2015   Procedure: CERVICAL FIVE-SIX ,CERVICAL SIX-SEVEN ANTERIOR CERVICAL DECOMPRESSION/DISCECTOMY FUSION PLATING BONEGRAFT;  Surgeon: Kristeen Miss, MD;  Location: [place] [facility];  Service: Neurosurgery;  Laterality: N/A;  . 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 Adora Fridge)  . COLONOSCOPY    . ESOPHAGEAL DILATION  2014  . infected lymph node surgery  80's  . NM MYOCAR PERF WALL MOTION  04/2011   bruce myoview; normal pattern of perfusion, EF 80%, low risk scan  . TONSILLECTOMY  70's  . TRANSTHORACIC ECHOCARDIOGRAM  12/2007   mild conc  LVH    There were no vitals filed for this visit.      Subjective Assessment - 04/27/16 1104    Subjective Went to cardiologist, doppler was negative for DVT in LLE. Figured out at way to strap left ankle to stay into DF during the night. Felt less tight and pain in LE gastroc and hamstring.   Limitations Walking;House hold activities   How long can you stand comfortably?     Currently in Pain? No/denies                         [facility] Adult PT Treatment/Exercise - 04/27/16 0001      Ambulation/Gait   Ambulation/Gait Yes   Ambulation/Gait Assistance 6: Modified independent (Device/Increase time)   Ambulation/Gait Assistance Details practiced gait without AFO, pt able to slightly plantarflex with left toe off. pt is aware that L foot occasionally does drag and is able to self correct.   Ambulation Distance (Feet) 300 Feet   Assistive device None   Gait Pattern Step-through pattern;Left steppage;Left foot flat;Poor foot clearance - left   Ambulation Surface Level;Indoor   Stairs Yes   Stairs Assistance 5: Supervision   Stairs Assistance Details (indicate cue type and reason) attempting reicpral pattern with decreased UE support and no L AFO   Stair Management Technique One rail Left;Two rails;Alternating pattern   Number of Stairs 4  x4  Knee/Hip Exercises: Machines for Strengthening   Total Gym Leg Press LLE only, 50# 8x2, cues for mechanics     Knee/Hip Exercises: Standing   Forward Step Up Left;10 reps;Hand Hold: 2  cues for LLE control   Step Down Left  attempted with the LLE but reported L gastroc being to painf     Knee/Hip Exercises: Prone   Hamstring Curl 2 sets;5 reps   Hip Extension AROM;5 reps  knee bent     Ankle Exercises: Seated   Other Seated Ankle Exercises ARROM L ankle plantar flexion x10 and AAROM L ankle Dorsiflexion x10                PT Education - 04/27/16 1155    Education provided Yes   Education Details Gave pt  information for [employer] so pt can go by and look at the [facility] boot.   Person(s) Educated Patient   Methods Explanation   Comprehension Verbalized understanding          PT Short Term Goals - 04/14/16 1215      PT SHORT TERM GOAL #1   Title Will assess BERG balance test in order to set LTG and indicate improved balance (Target Date: 03/23/16)   Baseline Pt is at high level with balance and will perform DGI and set goal appropriately.    Time 2   Period Weeks   Status Deferred           PT Long Term Goals - 04/03/16 1136      PT LONG TERM GOAL #1   Title Pt will be independent with HEP in order to indicate improved functional mobility and decreased fall risk.  (Modified Target Date: 05/29/16)   Time 4   Period Weeks   Status New     PT LONG TERM GOAL #2   Title Will assess varying braces in order to indicate improved safety due to L foot drop.     Time 4   Period Weeks   Status New     PT LONG TERM GOAL #3   Title Pt will ambulate 300' over varying indoor surfaces without AD at mod I level (with brace as needed) in order to indicate improved safety at home.    Time 4   Period Weeks   Status New     PT LONG TERM GOAL #4   Title Pt will improve BERG balance test 4 points from baseline with use of AFO/brace in order to indicate decreased fall risk.     Time 4   Period Weeks   Status New     PT LONG TERM GOAL #5   Title Pt will improve gait speed to 2.33 ft/sec w/ LRAD and bracing as needed in order to indicate decreased fall risk and improved efficency of gait.    Time 4   Period Weeks   Status New               Plan - 04/27/16 1254    Clinical Impression Statement Pt demostrates increased Left hamstring and Left ankle strength.  Pt is progressing slowly with greater Left ankle involvement during gait.  Continues to have some pain and discomfort in lef calf when descending stairs.   Rehab Potential Good   Clinical Impairments Affecting Rehab Potential high  co-pay, unsure of prognosis of nerve damage.    PT Frequency 2x / week   PT Duration 8 weeks  will likely only need 4 weeks more of PT  PT Treatment/Interventions ADLs/Self Care Home Management;Electrical Stimulation;DME Instruction;Gait training;Stair training;Functional mobility training;Therapeutic activities;Therapeutic exercise;Balance training;Neuromuscular re-education;Patient/family education;Orthotic Fit/Training   PT Next Visit Plan Trial Left foot up; L NMR-forced use, hamstring strengthening, Discuss PRFAO   Consulted and Agree with Plan of Care Patient      Patient will benefit from skilled therapeutic intervention in order to improve the following deficits and impairments:  Abnormal gait, Decreased balance, Decreased coordination, Decreased knowledge of use of DME, Decreased mobility, Decreased strength, Increased edema, Impaired flexibility, Improper body mechanics, Postural dysfunction, Pain  Visit Diagnosis: No diagnosis found.     Problem List Patient Active Problem List   Diagnosis Date Noted  . Left leg weakness 02/10/2016  . Left foot drop 02/10/2016  . DJD (degenerative joint disease) 01/26/2016  . Acute respiratory failure with hypoxia ([place]) 01/22/2016  . Angioedema 01/21/2016  . Cervical spondylosis with myelopathy and radiculopathy 06/08/2015  . Coronary artery disease 12/18/2014  . Esophageal stricture 04/23/2014  . Lower extremity numbness 08/11/2013  . Essential hypertension, malignant 05/15/2013  . Hyperlipidemia 05/15/2013  . Dyspnea on exertion 05/15/2013  . Lower extremity edema 05/15/2013  . Diarrhea 02/12/2013  . Esophageal reflux 01/20/2010  . Dysphagia 01/20/2010    Megan Hull, PTA  04/27/16, 12:59 PM [facility] [address] [place], [place], [address] Phone: [phone]   Fax:  [phone]  Name: Megan Hull MRN: [id number] Date of Birth: 04/15/1941

## 2016-05-01 ENCOUNTER — Encounter: Payer: Self-pay | Admitting: Physical Therapy

## 2016-05-01 ENCOUNTER — Ambulatory Visit: Payer: Commercial Managed Care - HMO | Admitting: Physical Therapy

## 2016-05-01 DIAGNOSIS — R2689 Other abnormalities of gait and mobility: Secondary | ICD-10-CM | POA: Diagnosis not present

## 2016-05-01 DIAGNOSIS — R2681 Unsteadiness on feet: Secondary | ICD-10-CM

## 2016-05-01 DIAGNOSIS — M21372 Foot drop, left foot: Secondary | ICD-10-CM

## 2016-05-01 NOTE — Therapy (Signed)
[facility] [address] [place], [place], [address] Phone: [phone]   Fax:  [phone]  Physical Therapy Treatment  Patient Details  Name: Megan Hull MRN: [id number] Date of Birth: Dec 30, 1940 Referring Provider: Sarina Ill, MD  Encounter Date: 05/01/2016      PT End of Session - 05/01/16 1630    Visit Number 9   Number of Visits 14   Date for PT Re-Evaluation 05/29/16   Authorization Type [employer] MCR-G code every 10th visit   PT Start Time 1018   PT Stop Time 1100   PT Time Calculation (min) 42 min   Equipment Utilized During Treatment Gait belt   Activity Tolerance Patient tolerated treatment well   Behavior During Therapy [facility] for tasks assessed/performed      Past Medical History:  Diagnosis Date  . Arthritis   . Diabetes mellitus without complication (HCC)    borderline  . Diarrhea   . Diverticulosis   . Dysrhythmia    controlled with Metoprolol-12-19-13 LOV -Dr. Gwenlyn Found sees yearly  . Family history of premature CAD   . GERD (gastroesophageal reflux disease)   . Heart murmur    yrs ago  . HLD (hyperlipidemia)   . Hypertension   . Lower extremity edema   . Status post dilation of esophageal narrowing   . Supraventricular arrhythmia   . Tobacco abuse     Past Surgical History:  Procedure Laterality Date  . ANTERIOR CERVICAL DECOMP/DISCECTOMY FUSION N/A 06/08/2015   Procedure: CERVICAL FIVE-SIX ,CERVICAL SIX-SEVEN ANTERIOR CERVICAL DECOMPRESSION/DISCECTOMY FUSION PLATING BONEGRAFT;  Surgeon: Kristeen Miss, MD;  Location: [place];  Service: Neurosurgery;  Laterality: N/A;  . 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 Adora Fridge)  . COLONOSCOPY    . ESOPHAGEAL DILATION  2014  . infected lymph node surgery  80's  . NM MYOCAR PERF WALL MOTION  04/2011   bruce myoview; normal pattern of perfusion, EF 80%, low risk scan  . TONSILLECTOMY  70's  .  TRANSTHORACIC ECHOCARDIOGRAM  12/2007   mild conc LVH    There were no vitals filed for this visit.      Subjective Assessment - 05/01/16 1020    Subjective Used Foot Up brace over the weekend and had no increase LLE swelling.  Pt also went to gym and worked seated bike and leg press.   Limitations Walking;House hold activities   How long can you stand comfortably?     Currently in Pain? No/denies                         [facility] Adult PT Treatment/Exercise - 05/01/16 0001      Knee/Hip Exercises: Prone   Hamstring Curl 2 sets;5 reps   Hip Extension (knee bent) AROM;5 reps;2 sets     Ankle Exercises: Seated   Other Seated Ankle Exercises ARROM L ankle plantar flexion x10 and AAROM L ankle Dorsiflexion x10             Balance Exercises - 05/01/16 1043      Balance Exercises: Standing   Wall Bumps Hip   Wall Bumps-Hips Eyes opened;Anterior/posterior;Foam/compliant surface  cuess for body mechanics   Stepping Strategy Foam/compliant surface;Anterior;Posterior;Lateral  intermittent UE support and min guard             PT Short Term Goals - 04/14/16 1215      PT SHORT TERM GOAL #1  Title Will assess BERG balance test in order to set LTG and indicate improved balance (Target Date: 03/23/16)   Baseline Pt is at high level with balance and will perform DGI and set goal appropriately.    Time 2   Period Weeks   Status Deferred           PT Long Term Goals - 04/03/16 1136      PT LONG TERM GOAL #1   Title Pt will be independent with HEP in order to indicate improved functional mobility and decreased fall risk.  (Modified Target Date: 05/29/16)   Time 4   Period Weeks   Status New     PT LONG TERM GOAL #2   Title Will assess varying braces in order to indicate improved safety due to L foot drop.     Time 4   Period Weeks   Status New     PT LONG TERM GOAL #3   Title Pt will ambulate 300' over varying indoor surfaces without AD at mod I  level (with brace as needed) in order to indicate improved safety at home.    Time 4   Period Weeks   Status New     PT LONG TERM GOAL #4   Title Pt will improve BERG balance test 4 points from baseline with use of AFO/brace in order to indicate decreased fall risk.     Time 4   Period Weeks   Status New     PT LONG TERM GOAL #5   Title Pt will improve gait speed to 2.33 ft/sec w/ LRAD and bracing as needed in order to indicate decreased fall risk and improved efficency of gait.    Time 4   Period Weeks   Status New               Plan - 05/01/16 1630    Clinical Impression Statement Pt's balance reactions are challenges with standing balance activities on compliant surface (performed without L AFO) requiring intermittent UE support and supervision.   Rehab Potential Good   Clinical Impairments Affecting Rehab Potential high co-pay, unsure of prognosis of nerve damage.    PT Frequency 2x / week   PT Duration 8 weeks  will likely only need 4 weeks more of PT   PT Treatment/Interventions ADLs/Self Care Home Management;Electrical Stimulation;DME Instruction;Gait training;Stair training;Functional mobility training;Therapeutic activities;Therapeutic exercise;Balance training;Neuromuscular re-education;Patient/family education;Orthotic Fit/Training   PT Next Visit Plan L NMR-forced use, hamstring strengthening, Discuss PRFAO/Foot up   Consulted and Agree with Plan of Care Patient      Patient will benefit from skilled therapeutic intervention in order to improve the following deficits and impairments:  Abnormal gait, Decreased balance, Decreased coordination, Decreased knowledge of use of DME, Decreased mobility, Decreased strength, Increased edema, Impaired flexibility, Improper body mechanics, Postural dysfunction, Pain  Visit Diagnosis: Unsteadiness on feet  Other abnormalities of gait and mobility  Foot drop, left     Problem List Patient Active Problem List    Diagnosis Date Noted  . Left leg weakness 02/10/2016  . Left foot drop 02/10/2016  . DJD (degenerative joint disease) 01/26/2016  . Acute respiratory failure with hypoxia ([place]) 01/22/2016  . Angioedema 01/21/2016  . Cervical spondylosis with myelopathy and radiculopathy 06/08/2015  . Coronary artery disease 12/18/2014  . Esophageal stricture 04/23/2014  . Lower extremity numbness 08/11/2013  . Essential hypertension, malignant 05/15/2013  . Hyperlipidemia 05/15/2013  . Dyspnea on exertion 05/15/2013  . Lower extremity edema 05/15/2013  .  Diarrhea 02/12/2013  . Esophageal reflux 01/20/2010  . Dysphagia 01/20/2010    Bjorn Loser, PTA  05/01/16, 4:33 PM [facility] [address] [place], [place], [address] Phone: [phone]   Fax:  [phone]  Name: Megan Hull MRN: [id number] Date of Birth: 05-31-1941

## 2016-05-03 ENCOUNTER — Ambulatory Visit: Payer: Commercial Managed Care - HMO | Admitting: Physical Therapy

## 2016-05-08 ENCOUNTER — Ambulatory Visit: Payer: Commercial Managed Care - HMO | Admitting: Rehabilitation

## 2016-05-08 ENCOUNTER — Encounter: Payer: Self-pay | Admitting: Rehabilitation

## 2016-05-08 ENCOUNTER — Ambulatory Visit
Admission: RE | Admit: 2016-05-08 | Discharge: 2016-05-08 | Disposition: A | Discharge status: Home / Self Care | Payer: Commercial Managed Care - HMO | Source: Ambulatory Visit | Attending: Family Medicine | Admitting: Family Medicine

## 2016-05-08 DIAGNOSIS — Z1231 Encounter for screening mammogram for malignant neoplasm of breast: Secondary | ICD-10-CM | POA: Diagnosis not present

## 2016-05-08 DIAGNOSIS — M21372 Foot drop, left foot: Secondary | ICD-10-CM

## 2016-05-08 DIAGNOSIS — R2681 Unsteadiness on feet: Secondary | ICD-10-CM | POA: Diagnosis not present

## 2016-05-08 DIAGNOSIS — R2689 Other abnormalities of gait and mobility: Secondary | ICD-10-CM | POA: Diagnosis not present

## 2016-05-08 DIAGNOSIS — M24572 Contracture, left ankle: Secondary | ICD-10-CM | POA: Diagnosis not present

## 2016-05-08 NOTE — Therapy (Signed)
[facility] [address] [place], [place], [address] Phone: [phone]   Fax:  [phone]  Physical Therapy Treatment  Patient Details  Name: Megan Hull MRN: [id number] Date of Birth: [birth year] Referring Provider: Sarina Ill, MD  Encounter Date: 05/08/2016      PT End of Session - 05/08/16 0948    Visit Number 10   Number of Visits 14   Date for PT Re-Evaluation 05/29/16   Authorization Type [employer] MCR-G code every 10th visit   PT Start Time 0931   PT Stop Time 1016   PT Time Calculation (min) 45 min   Equipment Utilized During Treatment Gait belt   Activity Tolerance Patient tolerated treatment well   Behavior During Therapy [facility] for tasks assessed/performed      Past Medical History:  Diagnosis Date  . Arthritis   . Diabetes mellitus without complication (HCC)    borderline  . Diarrhea   . Diverticulosis   . Dysrhythmia    controlled with Metoprolol-12-19-13 LOV -Dr. Gwenlyn Found sees yearly  . Family history of premature CAD   . GERD (gastroesophageal reflux disease)   . Heart murmur    yrs ago  . HLD (hyperlipidemia)   . Hypertension   . Lower extremity edema   . Status post dilation of esophageal narrowing   . Supraventricular arrhythmia   . Tobacco abuse     Past Surgical History:  Procedure Laterality Date  . ANTERIOR CERVICAL DECOMP/DISCECTOMY FUSION N/A 06/08/2015   Procedure: CERVICAL FIVE-SIX ,CERVICAL SIX-SEVEN ANTERIOR CERVICAL DECOMPRESSION/DISCECTOMY FUSION PLATING BONEGRAFT;  Surgeon: Kristeen Miss, MD;  Location: [place] [facility];  Service: Neurosurgery;  Laterality: N/A;  . 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 Adora Fridge)  . COLONOSCOPY    . ESOPHAGEAL DILATION  2014  . infected lymph node surgery  80's  . NM MYOCAR PERF WALL MOTION  04/2011   bruce myoview; normal pattern of perfusion, EF 80%, low risk scan  . TONSILLECTOMY  70's  .  TRANSTHORACIC ECHOCARDIOGRAM  12/2007   mild conc LVH    There were no vitals filed for this visit.      Subjective Assessment - 05/08/16 0935    Subjective "I'm going to the gym everyday and doing 30 mins."    Patient is accompained by: Family member   Limitations Walking;House hold activities   Currently in Pain? No/denies                         [facility] Adult PT Treatment/Exercise - 05/08/16 0935      Ambulation/Gait   Ambulation/Gait Yes   Ambulation/Gait Assistance 6: Modified independent (Device/Increase time)   Ambulation/Gait Assistance Details Assessed gait with and without foot up brace during session.  Continue to feel she is safer using foot up brace, but did not a degree of foot slap due to poor eccentric DF.   She continues to compensate somewhat with increased hip and knee flexion, but this is improving.     Ambulation Distance (Feet) 115 Feet  x 2 with and without foot up brace   Assistive device None  foot up brace   Gait Pattern Step-through pattern;Left steppage;Left foot flat;Poor foot clearance - left   Ambulation Surface Level;Indoor   Stairs Yes   Stairs Assistance 6: Modified independent (Device/Increase time)   Stair Management Technique No rails;One rail Right;Alternating pattern;Forwards   Number of Stairs 4  Height of Stairs 6     Self-Care   Self-Care Other Self-Care Comments   Other Self-Care Comments  Discussed pt going to [employer] to look at night splint.  Pt verbalized that she would call them today before leaving, PT provided directions to [facility].  Feel that this may help with PF/gastroc flexibility in addition to stretching.       Exercises   Other Exercises  Performed hamstring strengthening seated on rolling stool pulling herself forward with emphasis on LLE with cues for posture and decreased compensations. Performed x 115' with 3 rest breaks as needed.  Progressed to prone knee flex x 10 reps.  She tends to elevate  pelvis to compensate for decreased activation, but does well slowly lowering LLE to mat.  Ended with seated hamstring curl with red theraband x 10 reps with cues for decreased trunk compensations, therefore added this to HEP, see pt instruction.  Pt continues to demonstrate improved initiation of ankle DF and toe flexion/extension, therefore encouraged her to continue this at home.  Also encouraged her to perform hamstring curl at gym using BLEs at this time.  Pt verbalized understanding.   Performed seated Scifit machine x 3 mins in order to demonstrate how she could utilize machine to stretch PF/gastroc following her cardiovascular portion of workout.  Pt returned demonstration.                  PT Education - 05/08/16 [phone]    Education provided Yes   Education Details using bike at gym to increase flexibility in L ankle, going by [employer] to look at brace.    Person(s) Educated Patient   Methods Explanation   Comprehension Verbalized understanding          PT Short Term Goals - 04/14/16 1215      PT SHORT TERM GOAL #1   Title Will assess BERG balance test in order to set LTG and indicate improved balance (Target Date: 03/23/16)   Baseline Pt is at high level with balance and will perform DGI and set goal appropriately.    Time 2   Period Weeks   Status Deferred           PT Long Term Goals - 04/03/16 1136      PT LONG TERM GOAL #1   Title Pt will be independent with HEP in order to indicate improved functional mobility and decreased fall risk.  (Modified Target Date: 05/29/16)   Time 4   Period Weeks   Status New     PT LONG TERM GOAL #2   Title Will assess varying braces in order to indicate improved safety due to L foot drop.     Time 4   Period Weeks   Status New     PT LONG TERM GOAL #3   Title Pt will ambulate 300' over varying indoor surfaces without AD at mod I level (with brace as needed) in order to indicate improved safety at home.    Time 4   Period  Weeks   Status New     PT LONG TERM GOAL #4   Title Pt will improve BERG balance test 4 points from baseline with use of AFO/brace in order to indicate decreased fall risk.     Time 4   Period Weeks   Status New     PT LONG TERM GOAL #5   Title Pt will improve gait speed to 2.33 ft/sec w/ LRAD and bracing as needed  in order to indicate decreased fall risk and improved efficency of gait.    Time 4   Period Weeks   Status New               Plan - 06/05/2016 1159    Clinical Impression Statement Skilled session focused on gait with and without foot up brace in which she still demonstrates increased safety with use of foot up brace.  Worked during session without brace to increase activation and also addressed L hamstring activation.  See pt instruction for details on HEP additions.     Rehab Potential Good   Clinical Impairments Affecting Rehab Potential high co-pay, unsure of prognosis of nerve damage.    PT Frequency 2x / week   PT Duration 8 weeks  will likely only need 4 weeks more of PT   PT Treatment/Interventions ADLs/Self Care Home Management;Electrical Stimulation;DME Instruction;Gait training;Stair training;Functional mobility training;Therapeutic activities;Therapeutic exercise;Balance training;Neuromuscular re-education;Patient/family education;Orthotic Fit/Training   PT Next Visit Plan L NMR-forced use, hamstring strengthening, L ankle DF/PF   Consulted and Agree with Plan of Care Patient      Patient will benefit from skilled therapeutic intervention in order to improve the following deficits and impairments:  Abnormal gait, Decreased balance, Decreased coordination, Decreased knowledge of use of DME, Decreased mobility, Decreased strength, Increased edema, Impaired flexibility, Improper body mechanics, Postural dysfunction, Pain  Visit Diagnosis: Unsteadiness on feet  Other abnormalities of gait and mobility  Foot drop, left       G-Codes - 06-05-16 1014     Functional Assessment Tool Used 2.81 ft/sec, 5TSS:  8.50 secs   Functional Limitation Mobility: Walking and moving around   Mobility: Walking and Moving Around Current Status [phone]) At least 1 percent but less than 20 percent impaired, limited or restricted   Mobility: Walking and Moving Around Goal Status [phone]) At least 1 percent but less than 20 percent impaired, limited or restricted      Physical Therapy Progress Note  Dates of Reporting Period: 03/09/16 to Jun 05, 2016  Objective Reports of Subjective Statement: See above  Objective Measurements: gait speed 2.81 ft/sec, 5TSS 8.50 secs  Goal Update: See LTGs  Plan: Continue POC  Reason Skilled Services are Required: Pt continues to have high level balance deficits associated with decreased strength/activation of L DF/PF and L hamstring.       Problem List Patient Active Problem List   Diagnosis Date Noted  . Left leg weakness 02/10/2016  . Left foot drop 02/10/2016  . DJD (degenerative joint disease) 01/26/2016  . Acute respiratory failure with hypoxia ([place]) 01/22/2016  . Angioedema 01/21/2016  . Cervical spondylosis with myelopathy and radiculopathy 06/08/2015  . Coronary artery disease 12/18/2014  . Esophageal stricture 04/23/2014  . Lower extremity numbness 08/11/2013  . Essential hypertension, malignant 05/15/2013  . Hyperlipidemia 05/15/2013  . Dyspnea on exertion 05/15/2013  . Lower extremity edema 05/15/2013  . Diarrhea 02/12/2013  . Esophageal reflux 01/20/2010  . Dysphagia 01/20/2010    Cameron Sprang, PT, MPT [facility] [address] [place], [place], [address] Phone: [phone]   Fax:  [phone] 06-05-16, 12:02 PM  Name: ODDIE HWANG MRN: [id number] Date of Birth: 20-May-1941

## 2016-05-08 NOTE — Patient Instructions (Signed)
Hamstring Curl: Resisted (Sitting)    Facing anchor with tubing on right ankle, leg straight out, bend knee. Use red band.  Repeat __10_ times per set. Do _2___ sets per session. Do _4___ sessions per day.   http://orth.exer.us/669   Copyright  VHI. All rights reserved.   Hamstring Curl - Beginner    Prone with head on forearms, press down with knees and tailbone. Slowly bend knees only as far as you can maintain pelvic tilt. There should be no additional curve in lower back. Hold for __5__ breaths. Repeat __10__ times.  Try not to lift pelvis and when you lower leg to bed, control it slowly.    Copyright  VHI. All rights reserved.

## 2016-05-10 ENCOUNTER — Other Ambulatory Visit: Payer: Self-pay | Admitting: Family Medicine

## 2016-05-10 DIAGNOSIS — R928 Other abnormal and inconclusive findings on diagnostic imaging of breast: Secondary | ICD-10-CM

## 2016-05-12 ENCOUNTER — Encounter: Payer: Self-pay | Admitting: Rehabilitation

## 2016-05-12 ENCOUNTER — Ambulatory Visit: Payer: Commercial Managed Care - HMO | Attending: Neurology | Admitting: Rehabilitation

## 2016-05-12 DIAGNOSIS — R29898 Other symptoms and signs involving the musculoskeletal system: Secondary | ICD-10-CM | POA: Diagnosis not present

## 2016-05-12 DIAGNOSIS — R2681 Unsteadiness on feet: Secondary | ICD-10-CM | POA: Insufficient documentation

## 2016-05-12 DIAGNOSIS — R2689 Other abnormalities of gait and mobility: Secondary | ICD-10-CM | POA: Diagnosis not present

## 2016-05-12 NOTE — Patient Instructions (Signed)
FLEXION: Standing - Stable (Active)    Stand, both feet flat. Bend right knee, bringing heel toward buttocks. When you start, keep left foot behind you with toe touching floor only.  Lift at knee ONLY!! Don't use your trunk to lean forward and don't lift leg behind you (from hips).   Complete __2_ sets of _10__ repetitions. Perform __1_ sessions per day.  http://gtsc.exer.us/241   Copyright  VHI. All rights reserved.   Tandem Walking    Walk with each foot directly in front of other, heel of one foot touching toes of other foot with each step. Both feet straight ahead.  Walk along counter top with one hand lightly on counter.  Repeat x 3 laps down and back.     Copyright  VHI. All rights reserved.   Walking Head Turn    Standing close to a countertop, walk _5-8___ feet while turning head side to side . Touch counter top if necessary to keep balance. Repeat doing head turns up and down.   Repeat _3___ times. Do _2___ sessions per day.  http://gt2.exer.us/536   Copyright  VHI. All rights reserved.

## 2016-05-12 NOTE — Therapy (Signed)
[facility] [address] [place], [place], [address] Phone: [phone]   Fax:  [phone]  Physical Therapy Treatment  Patient Details  Name: Megan Hull MRN: [id number] Date of Birth: 10-20-1940 Referring Provider: Sarina Ill, MD  Encounter Date: 05/12/2016      PT End of Session - 05/12/16 1035    Visit Number 11   Number of Visits 14   Date for PT Re-Evaluation 05/29/16   Authorization Type [employer] MCR-G code every 10th visit   PT Start Time 1025  pt late to appt   PT Stop Time 1101   PT Time Calculation (min) 36 min   Equipment Utilized During Treatment Gait belt   Activity Tolerance Patient tolerated treatment well   Behavior During Therapy [facility] for tasks assessed/performed      Past Medical History:  Diagnosis Date  . Arthritis   . Diabetes mellitus without complication (HCC)    borderline  . Diarrhea   . Diverticulosis   . Dysrhythmia    controlled with Metoprolol-12-19-13 LOV -Dr. Gwenlyn Found sees yearly  . Family history of premature CAD   . GERD (gastroesophageal reflux disease)   . Heart murmur    yrs ago  . HLD (hyperlipidemia)   . Hypertension   . Lower extremity edema   . Status post dilation of esophageal narrowing   . Supraventricular arrhythmia   . Tobacco abuse     Past Surgical History:  Procedure Laterality Date  . ANTERIOR CERVICAL DECOMP/DISCECTOMY FUSION N/A 06/08/2015   Procedure: CERVICAL FIVE-SIX ,CERVICAL SIX-SEVEN ANTERIOR CERVICAL DECOMPRESSION/DISCECTOMY FUSION PLATING BONEGRAFT;  Surgeon: Kristeen Miss, MD;  Location: [facility];  Service: Neurosurgery;  Laterality: N/A;  . 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 Adora Fridge)  . COLONOSCOPY    . ESOPHAGEAL DILATION  2014  . infected lymph node surgery  80's  . NM MYOCAR PERF WALL MOTION  04/2011   bruce myoview; normal pattern of perfusion, EF 80%, low risk scan  .  TONSILLECTOMY  70's  . TRANSTHORACIC ECHOCARDIOGRAM  12/2007   mild conc LVH    There were no vitals filed for this visit.      Subjective Assessment - 05/12/16 1027    Subjective "I"ve been doing that pulling with my legs at home."    Patient is accompained by: Family member   Limitations Walking;House hold activities   Currently in Pain? No/denies            [facility] PT Assessment - 05/12/16 1049      Functional Gait  Assessment   Gait assessed  Yes   Gait Level Surface Walks 20 ft in less than 7 sec but greater than 5.5 sec, uses assistive device, slower speed, mild gait deviations, or deviates 6-10 in outside of the 12 in walkway width.   Change in Gait Speed Makes only minor adjustments to walking speed, or accomplishes a change in speed with significant gait deviations, deviates 10-15 in outside the 12 in walkway width, or changes speed but loses balance but is able to recover and continue walking.   Gait with Horizontal Head Turns Performs head turns smoothly with slight change in gait velocity (eg, minor disruption to smooth gait path), deviates 6-10 in outside 12 in walkway width, or uses an assistive device.   Gait with Vertical Head Turns Performs task with moderate change in gait velocity, slows down, deviates 10-15 in outside 12 in walkway width  but recovers, can continue to walk.   Gait and Pivot Turn Pivot turns safely within 3 sec and stops quickly with no loss of balance.   Step Over Obstacle Is able to step over 2 stacked shoe boxes taped together (9 in total height) without changing gait speed. No evidence of imbalance.   Gait with Narrow Base of Support Ambulates 4-7 steps.   Gait with Eyes Closed Walks 20 ft, uses assistive device, slower speed, mild gait deviations, deviates 6-10 in outside 12 in walkway width. Ambulates 20 ft in less than 9 sec but greater than 7 sec.   Ambulating Backwards Walks 20 ft, uses assistive device, slower speed, mild gait deviations,  deviates 6-10 in outside 12 in walkway width.   Steps Alternating feet, must use rail.   Total Score 19        NMR:  Added balance to HEP and single exercise for isolated L hamstring activation, see pt instruction for details on reps and exercises.               [facility] Adult PT Treatment/Exercise - 05/12/16 0001      Self-Care   Self-Care Other Self-Care Comments   Other Self-Care Comments  Pt now has night splint to keep foot positioned into DF.  She is currently wearing from approx midnight to 4am following going to restroom.  Encouraged her to attempt to re-don following toileting in order to continue to strech gastroc/soleus.  Pt verbalized understanding.                  PT Education - 05/12/16 1035    Education provided Yes   Education Details see self care   Person(s) Educated Patient   Methods Explanation   Comprehension Verbalized understanding          PT Short Term Goals - 04/14/16 1215      PT SHORT TERM GOAL #1   Title Will assess BERG balance test in order to set LTG and indicate improved balance (Target Date: 03/23/16)   Baseline Pt is at high level with balance and will perform DGI and set goal appropriately.    Time 2   Period Weeks   Status Deferred           PT Long Term Goals - 05/12/16 1038      PT LONG TERM GOAL #1   Title Pt will be independent with HEP in order to indicate improved functional mobility and decreased fall risk.  (Modified Target Date: 05/29/16)   Baseline met 05/12/16   Time 4   Period Weeks   Status Achieved     PT LONG TERM GOAL #2   Title Will assess varying braces in order to indicate improved safety due to L foot drop.     Baseline pt currently wearing foot up brace 05/12/16   Time 4   Period Weeks   Status Achieved     PT LONG TERM GOAL #3   Title Pt will ambulate 300' over varying indoor surfaces without AD at mod I level (with brace as needed) in order to indicate improved safety at home.    Time  4   Period Weeks   Status New     PT LONG TERM GOAL #4   Title Pt will improve FGA 3 points from baseline in order to indicate decreased fall risk.     Baseline 19/30 on 05/12/16   Time 4   Period Weeks   Status Revised  PT LONG TERM GOAL #5   Title Pt will improve gait speed to 2.33 ft/sec w/ LRAD and bracing as needed in order to indicate decreased fall risk and improved efficency of gait.    Time 4   Period Weeks   Status New               Plan - 05/12/16 1246    Clinical Impression Statement Skilled session focused on assessment of balance at higher level with FGA, note score of 19/30 with education on results.  Added balance to HEP as well as one more exercise for isolated hamstring activation.  See pt instruction for details.  Pt to decrease frequency to 1x/wk for the next two week with D/C following.  Pt verbalized agreement.     Rehab Potential Good   Clinical Impairments Affecting Rehab Potential high co-pay, unsure of prognosis of nerve damage.    PT Frequency 2x / week   PT Duration 8 weeks  will likely only need 4 weeks more of PT   PT Treatment/Interventions ADLs/Self Care Home Management;Electrical Stimulation;DME Instruction;Gait training;Stair training;Functional mobility training;Therapeutic activities;Therapeutic exercise;Balance training;Neuromuscular re-education;Patient/family education;Orthotic Fit/Training   PT Next Visit Plan add balance to HEP as able, L NMR-forced use, hamstring strengthening, L ankle DF/PF   Consulted and Agree with Plan of Care Patient      Patient will benefit from skilled therapeutic intervention in order to improve the following deficits and impairments:  Abnormal gait, Decreased balance, Decreased coordination, Decreased knowledge of use of DME, Decreased mobility, Decreased strength, Increased edema, Impaired flexibility, Improper body mechanics, Postural dysfunction, Pain  Visit Diagnosis: Unsteadiness on feet  Other  abnormalities of gait and mobility  Weakness of left lower extremity     Problem List Patient Active Problem List   Diagnosis Date Noted  . Left leg weakness 02/10/2016  . Left foot drop 02/10/2016  . DJD (degenerative joint disease) 01/26/2016  . Acute respiratory failure with hypoxia ([place]) 01/22/2016  . Angioedema 01/21/2016  . Cervical spondylosis with myelopathy and radiculopathy 06/08/2015  . Coronary artery disease 12/18/2014  . Esophageal stricture 04/23/2014  . Lower extremity numbness 08/11/2013  . Essential hypertension, malignant 05/15/2013  . Hyperlipidemia 05/15/2013  . Dyspnea on exertion 05/15/2013  . Lower extremity edema 05/15/2013  . Diarrhea 02/12/2013  . Esophageal reflux 01/20/2010  . Dysphagia 01/20/2010    Cameron Sprang, PT, MPT [facility] [address] [place], [place], [address] Phone: [phone]   Fax:  [phone] 05/12/16, 12:50 PM  Name: TRESA JOLLEY MRN: [id number] Date of Birth: 12-23-1940

## 2016-05-17 DIAGNOSIS — L609 Nail disorder, unspecified: Secondary | ICD-10-CM | POA: Diagnosis not present

## 2016-05-17 DIAGNOSIS — Z0000 Encounter for general adult medical examination without abnormal findings: Secondary | ICD-10-CM | POA: Diagnosis not present

## 2016-05-17 DIAGNOSIS — R7303 Prediabetes: Secondary | ICD-10-CM | POA: Diagnosis not present

## 2016-05-17 DIAGNOSIS — R5383 Other fatigue: Secondary | ICD-10-CM | POA: Diagnosis not present

## 2016-05-17 DIAGNOSIS — Z91018 Allergy to other foods: Secondary | ICD-10-CM | POA: Diagnosis not present

## 2016-05-17 DIAGNOSIS — I10 Essential (primary) hypertension: Secondary | ICD-10-CM | POA: Diagnosis not present

## 2016-05-17 DIAGNOSIS — E7800 Pure hypercholesterolemia, unspecified: Secondary | ICD-10-CM | POA: Diagnosis not present

## 2016-05-18 ENCOUNTER — Ambulatory Visit
Admission: RE | Admit: 2016-05-18 | Discharge: 2016-05-18 | Disposition: A | Discharge status: Home / Self Care | Payer: Commercial Managed Care - HMO | Source: Ambulatory Visit | Attending: Family Medicine | Admitting: Family Medicine

## 2016-05-18 DIAGNOSIS — R928 Other abnormal and inconclusive findings on diagnostic imaging of breast: Secondary | ICD-10-CM

## 2016-05-19 ENCOUNTER — Encounter: Payer: Self-pay | Admitting: Rehabilitation

## 2016-05-19 ENCOUNTER — Ambulatory Visit: Payer: Commercial Managed Care - HMO | Admitting: Rehabilitation

## 2016-05-19 ENCOUNTER — Telehealth: Payer: Self-pay | Admitting: Neurology

## 2016-05-19 DIAGNOSIS — R29898 Other symptoms and signs involving the musculoskeletal system: Secondary | ICD-10-CM | POA: Diagnosis not present

## 2016-05-19 DIAGNOSIS — R2681 Unsteadiness on feet: Secondary | ICD-10-CM | POA: Diagnosis not present

## 2016-05-19 DIAGNOSIS — R2689 Other abnormalities of gait and mobility: Secondary | ICD-10-CM

## 2016-05-19 DIAGNOSIS — B351 Tinea unguium: Secondary | ICD-10-CM

## 2016-05-19 NOTE — Telephone Encounter (Signed)
Patient would like a referral to a podiatrist for nail trimming that will be covered by [facility] insurance Her best call back is [phone]

## 2016-05-19 NOTE — Therapy (Signed)
[facility] [address] [place], [place], [address] Phone: [phone]   Fax:  [phone]  Physical Therapy Treatment and DC Summary   Patient Details  Name: Megan Hull MRN: [id number] Date of Birth: 1940/12/13 Referring Provider: Sarina Ill, MD  Encounter Date: 05/19/2016      PT End of Session - 05/19/16 0853    Visit Number 12   Number of Visits 14   Date for PT Re-Evaluation 05/29/16   Authorization Type [employer] MCR-G code every 10th visit   PT Start Time 0845  D/C visit, did not need whole time   PT Stop Time 0919   PT Time Calculation (min) 34 min   Equipment Utilized During Treatment Gait belt   Activity Tolerance Patient tolerated treatment well   Behavior During Therapy [facility] for tasks assessed/performed      Past Medical History:  Diagnosis Date  . Arthritis   . Diabetes mellitus without complication (HCC)    borderline  . Diarrhea   . Diverticulosis   . Dysrhythmia    controlled with Metoprolol-12-19-13 LOV -Dr. Gwenlyn Found sees yearly  . Family history of premature CAD   . GERD (gastroesophageal reflux disease)   . Heart murmur    yrs ago  . HLD (hyperlipidemia)   . Hypertension   . Lower extremity edema   . Status post dilation of esophageal narrowing   . Supraventricular arrhythmia   . Tobacco abuse     Past Surgical History:  Procedure Laterality Date  . ANTERIOR CERVICAL DECOMP/DISCECTOMY FUSION N/A 06/08/2015   Procedure: CERVICAL FIVE-SIX ,CERVICAL SIX-SEVEN ANTERIOR CERVICAL DECOMPRESSION/DISCECTOMY FUSION PLATING BONEGRAFT;  Surgeon: Kristeen Miss, MD;  Location: [place] [facility];  Service: Neurosurgery;  Laterality: N/A;  . 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 Adora Fridge)  . COLONOSCOPY    . ESOPHAGEAL DILATION  2014  . infected lymph node surgery  80's  . NM MYOCAR PERF WALL MOTION  04/2011   bruce myoview; normal pattern of perfusion,  EF 80%, low risk scan  . TONSILLECTOMY  70's  . TRANSTHORACIC ECHOCARDIOGRAM  12/2007   mild conc LVH    There were no vitals filed for this visit.      Subjective Assessment - 05/19/16 0847    Subjective "Im sore from going to the gym yesterday I think."    Patient is accompained by: Family member   Limitations Walking;House hold activities   Currently in Pain? Yes   Pain Score 6    Pain Location Calf   Pain Orientation Left   Pain Descriptors / Indicators Tightness   Pain Type Acute pain   Pain Onset More than a month ago   Pain Frequency Intermittent            [facility] PT Assessment - 05/19/16 0909      Functional Gait  Assessment   Gait assessed  Yes   Gait Level Surface Walks 20 ft in less than 7 sec but greater than 5.5 sec, uses assistive device, slower speed, mild gait deviations, or deviates 6-10 in outside of the 12 in walkway width.   Change in Gait Speed Able to change speed, demonstrates mild gait deviations, deviates 6-10 in outside of the 12 in walkway width, or no gait deviations, unable to achieve a major change in velocity, or uses a change in velocity, or uses an assistive device.   Gait with Horizontal Head Turns Performs head turns smoothly  with no change in gait. Deviates no more than 6 in outside 12 in walkway width   Gait with Vertical Head Turns Performs task with slight change in gait velocity (eg, minor disruption to smooth gait path), deviates 6 - 10 in outside 12 in walkway width or uses assistive device   Gait and Pivot Turn Pivot turns safely within 3 sec and stops quickly with no loss of balance.   Step Over Obstacle Is able to step over one shoe box (4.5 in total height) without changing gait speed. No evidence of imbalance.   Gait with Narrow Base of Support Ambulates 4-7 steps.   Gait with Eyes Closed Walks 20 ft, uses assistive device, slower speed, mild gait deviations, deviates 6-10 in outside 12 in walkway width. Ambulates 20 ft in less than  9 sec but greater than 7 sec.   Ambulating Backwards Walks 20 ft, uses assistive device, slower speed, mild gait deviations, deviates 6-10 in outside 12 in walkway width.   Steps Alternating feet, must use rail.   Total Score 21           TA:  Performed runners stretch on L calf x 2 reps of 1-2 mins during session prior to gait and balance activities due to increased soreness today.  Discussed how ambulating over varying surfaces requires ankle and LE muscles to work differently and this is likely the reason she is more sore today (did a lot of walking over varying surfaces yesterday outdoors).  Assessed gait speed during session with speed of 2.74 ft/sec with foot up brace only.  She does require min cues for controlled eccentric DF on LLE, however she can correct, difficulty maintaining esp with fatigue or distraction.                    PT Education - 05/19/16 [phone]    Education provided Yes   Person(s) Educated Patient   Methods Explanation   Comprehension Verbalized understanding          PT Short Term Goals - 04/14/16 1215      PT SHORT TERM GOAL #1   Title Will assess BERG balance test in order to set LTG and indicate improved balance (Target Date: 03/23/16)   Baseline Pt is at high level with balance and will perform DGI and set goal appropriately.    Time 2   Period Weeks   Status Deferred           PT Long Term Goals - 05/19/16 0853      PT LONG TERM GOAL #1   Title Pt will be independent with HEP in order to indicate improved functional mobility and decreased fall risk.  (Modified Target Date: 05/29/16)   Baseline met 05/12/16   Time 4   Period Weeks   Status Achieved     PT LONG TERM GOAL #2   Title Will assess varying braces in order to indicate improved safety due to L foot drop.     Baseline pt currently wearing foot up brace 05/12/16   Time 4   Period Weeks   Status Achieved     PT LONG TERM GOAL #3   Title Pt will ambulate 300' over  varying indoor surfaces without AD at mod I level (with brace as needed) in order to indicate improved safety at home.    Baseline met indoor and outdoor over grassy surfaces >1000' with foot up brace at mod I level   Time 4  Period Weeks   Status Achieved     PT LONG TERM GOAL #4   Title Pt will improve FGA 3 points from baseline in order to indicate decreased fall risk.     Baseline 21/30 on 05-22-2016   Time 4   Period Weeks   Status Achieved     PT LONG TERM GOAL #5   Title Pt will improve gait speed to 2.33 ft/sec w/ LRAD and bracing as needed in order to indicate decreased fall risk and improved efficency of gait.    Baseline 2.74 ft/sec on May 22, 2016   Time 4   Period Weeks   Status Achieved               Plan - 2016/05/22 9485    Clinical Impression Statement Skilled session focused on assessment of LTGs for D/C today (she has to cancel appt next week and PT felt that today could be D/C day).  She has met 5/5 LTGs and note that she met FGA goal having 3 point improvement from last week's session.  Pt ready for D/C.    Rehab Potential Good   Clinical Impairments Affecting Rehab Potential high co-pay, unsure of prognosis of nerve damage.    PT Frequency 2x / week   PT Duration 8 weeks  will likely only need 4 weeks more of PT   PT Treatment/Interventions ADLs/Self Care Home Management;Electrical Stimulation;DME Instruction;Gait training;Stair training;Functional mobility training;Therapeutic activities;Therapeutic exercise;Balance training;Neuromuscular re-education;Patient/family education;Orthotic Fit/Training   PT Next Visit Plan --   Consulted and Agree with Plan of Care Patient      Patient will benefit from skilled therapeutic intervention in order to improve the following deficits and impairments:  Abnormal gait, Decreased balance, Decreased coordination, Decreased knowledge of use of DME, Decreased mobility, Decreased strength, Increased edema, Impaired flexibility,  Improper body mechanics, Postural dysfunction, Pain  Visit Diagnosis: Unsteadiness on feet  Other abnormalities of gait and mobility  Weakness of left lower extremity       G-Codes - 05-22-16 0926    Functional Assessment Tool Used FGA 21/30, gait speed 2.74 ft/sec   Functional Limitation Mobility: Walking and moving around   Mobility: Walking and Moving Around Current Status [phone]) At least 1 percent but less than 20 percent impaired, limited or restricted   Mobility: Walking and Moving Around Goal Status [phone]) At least 1 percent but less than 20 percent impaired, limited or restricted   Mobility: Walking and Moving Around Discharge Status [phone]) At least 1 percent but less than 20 percent impaired, limited or restricted       PHYSICAL THERAPY DISCHARGE SUMMARY  Visits from Start of Care: 12  Current functional level related to goals / functional outcomes: See LTGs above   Remaining deficits: Pt continues to have mild L foot drop and L calf tightness with high level balance deficits.  Has HEP to address these issues.     Education / Equipment: HEP  Plan: Patient agrees to discharge.  Patient goals were met. Patient is being discharged due to meeting the stated rehab goals.  ?????        Problem List Patient Active Problem List   Diagnosis Date Noted  . Left leg weakness 02/10/2016  . Left foot drop 02/10/2016  . DJD (degenerative joint disease) 01/26/2016  . Acute respiratory failure with hypoxia ([place]) 01/22/2016  . Angioedema 01/21/2016  . Cervical spondylosis with myelopathy and radiculopathy 06/08/2015  . Coronary artery disease 12/18/2014  . Esophageal stricture 04/23/2014  .  Lower extremity numbness 08/11/2013  . Essential hypertension, malignant 05/15/2013  . Hyperlipidemia 05/15/2013  . Dyspnea on exertion 05/15/2013  . Lower extremity edema 05/15/2013  . Diarrhea 02/12/2013  . Esophageal reflux 01/20/2010  . Dysphagia 01/20/2010    Cameron Sprang, PT, MPT [facility] [address] [place], [place], [address] Phone: [phone]   Fax:  [phone] 05/19/16, 9:28 AM  Name: LORIANNA SPADACCINI MRN: [id number] Date of Birth: 1941-02-12

## 2016-05-22 NOTE — Telephone Encounter (Signed)
Dr Jaynee Eagles- are you ok with this?

## 2016-05-22 NOTE — Telephone Encounter (Signed)
Placed referral per AA,MD request.

## 2016-05-22 NOTE — Telephone Encounter (Signed)
That's fine, please place referral thanks

## 2016-05-26 ENCOUNTER — Ambulatory Visit: Payer: Commercial Managed Care - HMO | Admitting: Rehabilitation

## 2016-05-30 ENCOUNTER — Ambulatory Visit: Payer: Self-pay | Admitting: Podiatry

## 2016-06-02 DIAGNOSIS — M79675 Pain in left toe(s): Secondary | ICD-10-CM | POA: Diagnosis not present

## 2016-06-02 DIAGNOSIS — L600 Ingrowing nail: Secondary | ICD-10-CM | POA: Diagnosis not present

## 2016-06-13 ENCOUNTER — Telehealth: Payer: Self-pay | Admitting: Cardiovascular Disease

## 2016-06-13 NOTE — Telephone Encounter (Signed)
Returned call to patient. She states she is having edema in her L leg. She has drop foot so she has had swelling. She states the swelling is not new. She has more swelling than she had before. She states usually her swelling is from her knee down and is now up to her thigh. Patient has issues regulating her BP - amlodipine is new and she was told this may cause her to have LE edema. She states her BP is doing great, so she does not want to change medications.   Patient lies down in the afternoon to alleviate swelling. Currently wears compression stockings (knee high) and right above her knee swells.   She states she was told to let [place], [place] know if she had swelling. Message routed

## 2016-06-13 NOTE — Telephone Encounter (Signed)
New Message   Per patient experiencing some edema in lower left leg. Was told to notify nurse if that occurred.

## 2016-06-14 NOTE — Telephone Encounter (Signed)
Patient called with Megan Hull, [place] advice on med changes. She agreed with plan & voiced understanding. Asked patient to call our office with update after 3 days - if change is beneficial/tolerated, will need to update Rx with pharmacy.   She states she has gotten cramps in her hands from increasing lasix in the past. Advised OTC magnesium or potassium-rich foods and to call if unable to tolerate.

## 2016-06-14 NOTE — Telephone Encounter (Signed)
First I would suggest she increase her Lasix to 40 mg daily for 3 days to see if that helps. If it does she can increase her daily dose of Lasix to 40 mg MWF and 20 mg other days. If this doesn't help, or she doesn't want to increase her Lasix,we'll need to consider a thigh high compression stocking.  Kerin Ransom PA-C 06/14/2016 7:57 AM

## 2016-06-21 DIAGNOSIS — M792 Neuralgia and neuritis, unspecified: Secondary | ICD-10-CM | POA: Diagnosis not present

## 2016-07-20 DIAGNOSIS — T783XXD Angioneurotic edema, subsequent encounter: Secondary | ICD-10-CM | POA: Diagnosis not present

## 2016-07-20 DIAGNOSIS — Z91018 Allergy to other foods: Secondary | ICD-10-CM | POA: Diagnosis not present

## 2016-07-20 DIAGNOSIS — J018 Other acute sinusitis: Secondary | ICD-10-CM | POA: Diagnosis not present

## 2016-07-25 ENCOUNTER — Ambulatory Visit (INDEPENDENT_AMBULATORY_CARE_PROVIDER_SITE_OTHER): Payer: Medicare HMO | Admitting: Cardiovascular Disease

## 2016-07-25 ENCOUNTER — Encounter: Payer: Self-pay | Admitting: Cardiovascular Disease

## 2016-07-25 VITALS — BP 120/68 | HR 74 | Ht 65.0 in | Wt 166.4 lb

## 2016-07-25 DIAGNOSIS — R0609 Other forms of dyspnea: Secondary | ICD-10-CM

## 2016-07-25 DIAGNOSIS — E785 Hyperlipidemia, unspecified: Secondary | ICD-10-CM | POA: Diagnosis not present

## 2016-07-25 DIAGNOSIS — R0600 Dyspnea, unspecified: Secondary | ICD-10-CM

## 2016-07-25 MED ORDER — FUROSEMIDE 40 MG PO TABS: ORAL_TABLET | ORAL | 6 refills | Status: DC

## 2016-07-25 MED ORDER — SIMVASTATIN 40 MG PO TABS: 40.0000 mg | ORAL_TABLET | ORAL | 3 refills | Status: DC

## 2016-07-25 MED ORDER — CARVEDILOL 12.5 MG PO TABS: 12.5000 mg | ORAL_TABLET | Freq: Two times a day (BID) | ORAL | 3 refills | Status: DC

## 2016-07-25 MED ORDER — ISOSORBIDE MONONITRATE ER 30 MG PO TB24: 30.0000 mg | ORAL_TABLET | Freq: Every day | ORAL | 3 refills | Status: DC

## 2016-07-25 MED ORDER — CARVEDILOL 12.5 MG PO TABS
12.5000 mg | ORAL_TABLET | Freq: Two times a day (BID) | ORAL | 3 refills | Status: DC
Start: 2016-07-25 — End: 2016-07-25

## 2016-07-25 MED ORDER — HYDRALAZINE HCL 25 MG PO TABS: 25.0000 mg | ORAL_TABLET | Freq: Three times a day (TID) | ORAL | 6 refills | Status: DC

## 2016-07-25 NOTE — Assessment & Plan Note (Signed)
The patient has bilateral lower extremity edema on amlodipine 10 mg a day and Lasix 40 mg every other day. She is wearing compression stockings. She complains of increasing dyspnea on exertion and has had a pelvic weight gain over the last several months which I think is related to fluid retention. I'm going to discontinue her amlodipine and increase her Lasix to 40 mg daily. We'll check a basic metabolic panel in 2-3 weeks. I will recheck a 2-D echocardiogram as well.

## 2016-07-25 NOTE — Assessment & Plan Note (Signed)
History of noncritical CAD by cardiac catheterization which I performed August 2012. She has 60% mid LAD lesion as well as an anterior takeoff RCA with normal LV function. She denies chest pain. He did have a negative Myoview stress test performed 01/12/16.

## 2016-07-25 NOTE — Progress Notes (Signed)
07/25/2016 Megan Hull   [birth year]  ET:[id number]  Primary Physician Megan Nip, MD Primary Cardiologist: Megan Harp MD Megan Hull, [place]  HPI:  The patient is a delightful, 76 year old, mildly overweight, single [name] with no children whom I last saw 12/31/15.[name] Her problems include hypertension, hyperlipidemia, continued tobacco abuse of one-third pack per day, and family history of premature heart disease. She had a negative stress test on December 24, 2007. She was admitted to the [facility] on January 15, 2011, with jaw pain, thought to be an anginal equivalent. I catheterized her, revealing noncritical CAD with a 60% mid LAD lesion. She had anterior takeoff RCA. She has been asymptomatic since. She had carotid Dopplers performed at Dr. Roland Hull office which apparently were unremarkable. A lipid profile performed in March revealed total cholesterol of 142, LDL of 76, and HDL of 47. Her major complaint is of lower extremity edema, which gets worse during the day. I had done venous reflux studies on her back in November which were normal.since I saw her last a year and a half ago she has noticed increasing lower extremity edema which is dependent as well as increasing dyspnea on exertion. She denies chest pain. Her weight is stable same. She does admit to dietary indiscretion with regard to salt. She empirically increased her diuretic therapy. This resulted in improvement in her edema. She had recently come back from spending 5 weeks in [place] when I saw her last in July of last year and was anticipating going to [place]. I did do a Myoview stress test on her because of chest pain 01/12/16 which was normal. She developed an angioedema in August of last year probably related to her ARB. She didn't require intubation for airway protection. She had no allergy testing and ARB allergy was confirmed. She was begun on amlodipine which resulted in increasing lower  extremity edema, fluid retention and dyspnea..   Current Outpatient Prescriptions  Medication Sig Dispense Refill  . aspirin 81 MG tablet Take 162 mg by mouth daily.     . carvedilol (COREG) 12.5 MG tablet Take 12.5 mg by mouth 2 (two) times daily with a meal.    . cetirizine (ZYRTEC) 10 MG tablet Take 10 mg by mouth daily as needed for allergies.    . diphenhydrAMINE (BENADRYL ALLERGY) 25 MG tablet Take 2 tablets by mouth as needed for anaphylaxis.    [name] EPINEPHrine 0.3 mg/0.3 mL IJ SOAJ injection Inject 0.3 mLs (0.3 mg total) into the muscle once as needed (anaphylaxis). 2 Device 0  . furosemide (LASIX) 20 MG tablet Take 1 tablet by mouth daily.    . isosorbide mononitrate (IMDUR) 30 MG 24 hr tablet Take 1 tablet (30 mg total) by mouth daily. 30 tablet 1  . montelukast (SINGULAIR) 10 MG tablet Take 10 mg by mouth at bedtime.    . simvastatin (ZOCOR) 40 MG tablet Take 40 mg by mouth every 3 (three) days.     No current facility-administered medications for this visit.     Allergies  Allergen Reactions  . Nexium [Esomeprazole Magnesium] Anaphylaxis    Pt states she was unable to swallow for several hours after taking the nexium  . Other Hives, Shortness Of Breath and Other (See Comments)    Red Fish (Hives) Bojangle's chicken (swelling and shortness of breath) PAPRIKA  . Codeine Other (See Comments)    REACTION: chest pain  . Ace Inhibitors Swelling    Angioedema   .  Monosodium Glutamate Other (See Comments)    dizziness  . Shellfish Allergy Hives  . Tape Rash    Coban wrap turned her arm red (after stress test)    Social History   Social History  . Marital status: Single    Spouse name: N/A  . Number of children: 0  . Years of education: 12+   Occupational History  . [employer]    Social History Main Topics  . Smoking status: Former Smoker    Packs/day: 0.25    Years: 15.00    Types: Cigarettes    Quit date: 02/04/2016  . Smokeless tobacco: Never Used  . Alcohol use  12.6 oz/week    7 Shots of liquor, 14 Standard drinks or equivalent per week     Comment: every night scotch  . Drug use: No  . Sexual activity: Not on file   Other Topics Concern  . Not on file   Social History Narrative   Lives with sister   Caffeine use: 2 cups coffee per day (mixed decaf/regular)     Review of Systems: General: negative for chills, fever, night sweats or weight changes.  Cardiovascular: negative for chest pain, dyspnea on exertion, edema, orthopnea, palpitations, paroxysmal nocturnal dyspnea or shortness of breath Dermatological: negative for rash Respiratory: negative for cough or wheezing Urologic: negative for hematuria Abdominal: negative for nausea, vomiting, diarrhea, bright red blood per rectum, melena, or hematemesis Neurologic: negative for visual changes, syncope, or dizziness All other systems reviewed and are otherwise negative except as noted above.    Blood pressure 120/68, pulse 74, height 5\' 5"  (1.651 m), weight 166 lb 6.4 oz (75.5 kg), SpO2 98 %.  General appearance: alert and no distress Neck: no adenopathy, no carotid bruit, no JVD, supple, symmetrical, trachea midline and thyroid not enlarged, symmetric, no tenderness/mass/nodules Lungs: clear to auscultation bilaterally Heart: regular rate and rhythm, S1, S2 normal, no murmur, click, rub or gallop Extremities: 2-3+ pitting edema bilaterally. She is wearing compression stockings.  EKG not performed today  ASSESSMENT AND PLAN:   Essential hypertension, malignant History of hypertension blood pressure 120/68. She is on carvedilol and amlodipine. She was taken off of her arm because of angioedema requiring intubation. She also tested positive for this and allergy testing. Her complaints are of increasing lower extremity edema after starting amlodipine. She is wearing compression stockings. Weight is up approximately 8 pounds. I'm going to stop her amlodipine, increase her Lasix to 40 mg by  mouth daily for a week and then back to every other day. We will start her on hydralazine 25 mg by mouth 3 times a day she'll keep a blood pressure log on a daily basis and she will see Megan Hull back in 4 weeks for review and evaluation.  Hyperlipidemia History of hyperlipidemia on statin therapy with lipid profile performed 01/04/16 revealed total cholesterol 129, LDL 57 and HDL of 51  Lower extremity edema The patient has bilateral lower extremity edema on amlodipine 10 mg a day and Lasix 40 mg every other day. She is wearing compression stockings. She complains of increasing dyspnea on exertion and has had a pelvic weight gain over the last several months which I think is related to fluid retention. I'm going to discontinue her amlodipine and increase her Lasix to 40 mg daily. We'll check a basic metabolic panel in 2-3 weeks. I will recheck a 2-D echocardiogram as well.  Coronary artery disease History of noncritical CAD by cardiac catheterization which I performed August  2012. She has 60% mid LAD lesion as well as an anterior takeoff RCA with normal LV function. She denies chest pain. He did have a negative Myoview stress test performed 01/12/16.      Megan Harp MD FACP,FACC,FAHA, [facility] 07/25/2016 12:12 PM

## 2016-07-25 NOTE — Addendum Note (Signed)
Addended by: Therisa Doyne on: 07/25/2016 01:37 PM   Modules accepted: Orders

## 2016-07-25 NOTE — Assessment & Plan Note (Signed)
History of hypertension blood pressure 120/68. She is on carvedilol and amlodipine. She was taken off of her arm because of angioedema requiring intubation. She also tested positive for this and allergy testing. Her complaints are of increasing lower extremity edema after starting amlodipine. She is wearing compression stockings. Weight is up approximately 8 pounds. I'm going to stop her amlodipine, increase her Lasix to 40 mg by mouth daily for a week and then back to every other day. We will start her on hydralazine 25 mg by mouth 3 times a day she'll keep a blood pressure log on a daily basis and she will see Erasmo Downer back in 4 weeks for review and evaluation.

## 2016-07-25 NOTE — Assessment & Plan Note (Signed)
History of hyperlipidemia on statin therapy with lipid profile performed 01/04/16 revealed total cholesterol 129, LDL 57 and HDL of 51

## 2016-07-25 NOTE — Patient Instructions (Signed)
Medication Instructions: STOP Amlodipine  START Hydralazine 25 mg three times daily.  Increase Lasix to 40 mg daily for 1 week. Then take 40 mg every other day.   Labwork: Your physician recommends that you return for lab work: BMET in 2 weeks   Testing/Procedures: Your physician has requested that you have an echocardiogram. Echocardiography is a painless test that uses sound waves to create images of your heart. It provides your doctor with information about the size and shape of your heart and how well your heart's chambers and valves are working. This procedure takes approximately one hour. There are no restrictions for this procedure.  Follow-Up: Your physician recommends that you schedule a follow-up appointment in: 1 month with CVRR for BP Check  Your physician recommends that you schedule a follow-up appointment in: 3 months with Dr. Gwenlyn Found.   Any Other Special Instructions will be listed below:  Your physician has requested that you regularly monitor and record your blood pressure readings at home. Please use the same machine at the same time of day to check your readings and record them to bring to your follow-up visit.  If you need a refill on your cardiac medications before your next appointment, please call your pharmacy.

## 2016-07-27 ENCOUNTER — Ambulatory Visit (HOSPITAL_COMMUNITY): Payer: Medicare HMO | Attending: Cardiovascular Disease

## 2016-07-27 ENCOUNTER — Other Ambulatory Visit: Payer: Self-pay

## 2016-07-27 DIAGNOSIS — E785 Hyperlipidemia, unspecified: Secondary | ICD-10-CM | POA: Insufficient documentation

## 2016-07-27 DIAGNOSIS — I2510 Atherosclerotic heart disease of native coronary artery without angina pectoris: Secondary | ICD-10-CM | POA: Diagnosis not present

## 2016-07-27 DIAGNOSIS — R0609 Other forms of dyspnea: Secondary | ICD-10-CM | POA: Diagnosis not present

## 2016-07-27 DIAGNOSIS — R0600 Dyspnea, unspecified: Secondary | ICD-10-CM

## 2016-07-27 DIAGNOSIS — I10 Essential (primary) hypertension: Secondary | ICD-10-CM | POA: Diagnosis not present

## 2016-07-27 DIAGNOSIS — I071 Rheumatic tricuspid insufficiency: Secondary | ICD-10-CM | POA: Insufficient documentation

## 2016-07-27 DIAGNOSIS — I371 Nonrheumatic pulmonary valve insufficiency: Secondary | ICD-10-CM | POA: Diagnosis not present

## 2016-07-28 ENCOUNTER — Ambulatory Visit: Payer: Commercial Managed Care - HMO | Admitting: Cardiovascular Disease

## 2016-07-28 ENCOUNTER — Ambulatory Visit: Payer: Medicare HMO | Admitting: Cardiovascular Disease

## 2016-07-31 DIAGNOSIS — S61219A Laceration without foreign body of unspecified finger without damage to nail, initial encounter: Secondary | ICD-10-CM | POA: Diagnosis not present

## 2016-07-31 DIAGNOSIS — S60132A Contusion of left middle finger with damage to nail, initial encounter: Secondary | ICD-10-CM | POA: Diagnosis not present

## 2016-07-31 DIAGNOSIS — S67193A Crushing injury of left middle finger, initial encounter: Secondary | ICD-10-CM | POA: Diagnosis not present

## 2016-07-31 DIAGNOSIS — S6010XA Contusion of unspecified finger with damage to nail, initial encounter: Secondary | ICD-10-CM | POA: Diagnosis not present

## 2016-08-07 ENCOUNTER — Telehealth: Payer: Self-pay | Admitting: Cardiovascular Disease

## 2016-08-07 NOTE — Telephone Encounter (Signed)
New Message  Pt voiced she would like to know if she is fasting and suppose to have blood work.  Pt voiced to report her Echo.  Please f/u

## 2016-08-07 NOTE — Telephone Encounter (Signed)
Pt lab is BMP-not fasting, pt notified. Echo result given to pt(Normal sinus rhythm with grade 2 diastolic dysfunction and elevated filling pressures.) pt states that she would like a copy-mailed to pt.

## 2016-08-08 DIAGNOSIS — J0100 Acute maxillary sinusitis, unspecified: Secondary | ICD-10-CM | POA: Diagnosis not present

## 2016-08-08 DIAGNOSIS — E785 Hyperlipidemia, unspecified: Secondary | ICD-10-CM | POA: Diagnosis not present

## 2016-08-08 DIAGNOSIS — Z4802 Encounter for removal of sutures: Secondary | ICD-10-CM | POA: Diagnosis not present

## 2016-08-08 DIAGNOSIS — R0609 Other forms of dyspnea: Secondary | ICD-10-CM | POA: Diagnosis not present

## 2016-08-09 LAB — BASIC METABOLIC PANEL WITH GFR
BUN: 13 mg/dL (ref 7–25)
CHLORIDE: 102 mmol/L (ref 98–110)
CO2: 27 mmol/L (ref 20–31)
CREATININE: 0.91 mg/dL (ref 0.60–0.93)
Calcium: 9 mg/dL (ref 8.6–10.4)
GFR, Est African American: 71 mL/min (ref >=60)
GFR, Est Non African American: 61 mL/min (ref >=60)
Glucose, Bld: 138 mg/dL — ABNORMAL HIGH (ref 65–99)
Potassium: 3.9 mmol/L (ref 3.5–5.3)
SODIUM: 139 mmol/L (ref 135–146)

## 2016-08-11 ENCOUNTER — Telehealth: Payer: Self-pay | Admitting: Cardiovascular Disease

## 2016-08-11 NOTE — Telephone Encounter (Signed)
Returned call to patient lab results given.Copy of lab mailed to patient. 

## 2016-08-11 NOTE — Telephone Encounter (Signed)
Patient is returning your call in reference to lab results, thanks. If you return the call after 10am please call the cell # at [phone]. Thanks.

## 2016-08-22 ENCOUNTER — Ambulatory Visit (INDEPENDENT_AMBULATORY_CARE_PROVIDER_SITE_OTHER): Payer: Medicare HMO | Admitting: Pharmacist

## 2016-08-22 ENCOUNTER — Encounter: Payer: Self-pay | Admitting: Pharmacist

## 2016-08-22 VITALS — BP 120/62 | HR 72

## 2016-08-22 DIAGNOSIS — I10 Essential (primary) hypertension: Secondary | ICD-10-CM

## 2016-08-22 MED ORDER — CARVEDILOL 12.5 MG PO TABS: 18.7500 mg | ORAL_TABLET | Freq: Two times a day (BID) | ORAL | 3 refills | Status: DC

## 2016-08-22 NOTE — Progress Notes (Signed)
Patient ID: Megan Hull                 DOB: [birth year]                      MRN: [id number]     HPI:  Megan Hull is a 76 y.o. female referred by Dr. Gwenlyn Found to HTN clinic.  PMH includes hypertension, hyperlipidemia, and family history of premature heart disease. Catheterization showed noncritical CAD with a 60% mid LAD lesion.  During most recent office visit with Dr Gwenlyn Found on 07/25/16, amlodipine was discontinued due to worsening lower extremity edema and hydralazine 25mg  TID was initiated to achieve BP control.  Patient experienced severe angioedema with valsartan and unable to use ACEi or ARBs.  Patient presents today for HTN follow up.  Denies headaches, dizziness, swelling or fatigue.   Major complain is the lack of good BP control at home stating her "blood pressure is all over the place".  Hydralazine effect is lasting ~6 hours and patient worried about inability to keep appropriate blood pressure control during the night    Current HTN meds:  Carvedilol 12.5mg  twice daily Furosemide 40mg  every other day Hydralazine 25mg  three times a day Isosorbide monotitrate 30mg  daily  Previously tried:  Irbesartan - caused angioedema  Amlodipine  - lower extremity edema  BP goal: <130/80  Family History: mother and siblings with hypertension. Mother died of MI in the 57s  Social History: former smoker, daily alcohol intake   Diet: home cook meals, avoid fried food and junk food, low sodium  Home BP readings: 9 readings ; 138/73 average (all in the morning); pulse 65-79 bpm  Wt Readings from Last 3 Encounters:  07/25/16 166 lb 6.4 oz (75.5 kg)  04/25/16 161 lb 12.8 oz (73.4 kg)  04/05/16 158 lb 9.6 oz (71.9 kg)   BP Readings from Last 3 Encounters:  08/22/16 120/62  07/25/16 120/68  04/25/16 120/66   Pulse Readings from Last 3 Encounters:  08/22/16 72  07/25/16 74  04/25/16 82    Past Medical History:  Diagnosis Date  . Arthritis   . Diabetes mellitus without  complication (HCC)    borderline  . Diarrhea   . Diverticulosis   . Dysrhythmia    controlled with Metoprolol-12-19-13 LOV -Dr. Gwenlyn Found sees yearly  . Family history of premature CAD   . GERD (gastroesophageal reflux disease)   . Heart murmur    yrs ago  . HLD (hyperlipidemia)   . Hypertension   . Lower extremity edema   . Status post dilation of esophageal narrowing   . Supraventricular arrhythmia   . Tobacco abuse     Current Outpatient Prescriptions on File Prior to Visit  Medication Sig Dispense Refill  . aspirin 81 MG tablet Take 162 mg by mouth daily.     . carvedilol (COREG) 12.5 MG tablet Take 1 tablet (12.5 mg total) by mouth 2 (two) times daily with a meal. 180 tablet 3  . cetirizine (ZYRTEC) 10 MG tablet Take 10 mg by mouth daily as needed for allergies.    . diphenhydrAMINE (BENADRYL ALLERGY) 25 MG tablet Take 2 tablets by mouth as needed for anaphylaxis.    [name] EPINEPHrine 0.3 mg/0.3 mL IJ SOAJ injection Inject 0.3 mLs (0.3 mg total) into the muscle once as needed (anaphylaxis). 2 Device 0  . furosemide (LASIX) 40 MG tablet Take 1 tablet by mouth daily for one week. Then take 1 tablet every other  day. 30 tablet 6  . hydrALAZINE (APRESOLINE) 25 MG tablet Take 1 tablet (25 mg total) by mouth 3 (three) times daily. 90 tablet 6  . isosorbide mononitrate (IMDUR) 30 MG 24 hr tablet Take 1 tablet (30 mg total) by mouth daily. 90 tablet 3  . montelukast (SINGULAIR) 10 MG tablet Take 10 mg by mouth at bedtime.    . simvastatin (ZOCOR) 40 MG tablet Take 1 tablet (40 mg total) by mouth every 3 (three) days. 90 tablet 3   No current facility-administered medications on file prior to visit.     Allergies  Allergen Reactions  . Nexium [Esomeprazole Magnesium] Anaphylaxis    Pt states she was unable to swallow for several hours after taking the nexium  . Other Hives, Shortness Of Breath and Other (See Comments)    Red Fish (Hives) Bojangle's chicken (swelling and shortness of breath)  PAPRIKA  . Codeine Other (See Comments)    REACTION: chest pain  . Ace Inhibitors Swelling    Angioedema   . Monosodium Glutamate Other (See Comments)    dizziness  . Shellfish Allergy Hives  . Tape Rash    Coban wrap turned her arm red (after stress test)    Blood pressure 120/62, pulse 72, SpO2 96 %.  Essential hypertension:  Blood pressure at goal today during office visit and tolerating current therapy. Patient remains anxious about lack of consistency in home BP readings and high BP every morning.  Average home reading is 138/73 with HR in the high 60s.   Counseling provided on hydralazine rapid action and short duration characteristics; therefore, the need for multiple doses every day.   Also discussed inability to use ARB/ACEi or amlodipine due to severe ADR in the past.   Patient remains insistent of better and consistent blood pressure control during the day.  Will increase carvedilol from 12.5mg  BID to 18.75mg  twice daily and follow up in 4 weeks.  Will consider changing hydralazine for a transdermal clonidine patch to provide a sustain drug delivery if needed.  Ninfa Giannelli Rodriguez-Guzman PharmD, [place] [address] 08/22/2016 11:16 AM

## 2016-08-22 NOTE — Patient Instructions (Addendum)
Return for a  follow up appointment in as needed  Your blood pressure today is 120/62 pulse 72  Check your blood pressure at home daily (if able) and keep record of the readings.  Take your BP meds as follows: ** Increase Carvedilol to 18.75mg  (1 and 1/2 of 12.5mg  tablet)  twice daily** ALL other medication without changes   Bring all of your meds, your BP cuff and your record of home blood pressures to your next appointment.  Exercise as you're able, try to walk approximately 30 minutes per day.  Keep salt intake to a minimum, especially watch canned and prepared boxed foods.  Eat more fresh fruits and vegetables and fewer canned items.  Avoid eating in fast food restaurants.    HOW TO TAKE YOUR BLOOD PRESSURE: . Rest 5 minutes before taking your blood pressure. .  Don't smoke or drink caffeinated beverages for at least 30 minutes before. . Take your blood pressure before (not after) you eat. . Sit comfortably with your back supported and both feet on the floor (don't cross your legs). . Elevate your arm to heart level on a table or a desk. . Use the proper sized cuff. It should fit smoothly and snugly around your bare upper arm. There should be enough room to slip a fingertip under the cuff. The bottom edge of the cuff should be 1 inch above the crease of the elbow. . Ideally, take 3 measurements at one sitting and record the average.

## 2016-10-02 ENCOUNTER — Other Ambulatory Visit: Payer: Self-pay

## 2016-10-02 DIAGNOSIS — R0600 Dyspnea, unspecified: Secondary | ICD-10-CM

## 2016-10-02 DIAGNOSIS — E785 Hyperlipidemia, unspecified: Secondary | ICD-10-CM

## 2016-10-02 DIAGNOSIS — R0609 Other forms of dyspnea: Secondary | ICD-10-CM

## 2016-10-02 MED ORDER — HYDRALAZINE HCL 25 MG PO TABS: 25.0000 mg | ORAL_TABLET | Freq: Three times a day (TID) | ORAL | 3 refills | Status: DC

## 2016-10-04 DIAGNOSIS — R0609 Other forms of dyspnea: Secondary | ICD-10-CM | POA: Diagnosis not present

## 2016-10-06 ENCOUNTER — Ambulatory Visit (INDEPENDENT_AMBULATORY_CARE_PROVIDER_SITE_OTHER): Payer: Medicare HMO | Admitting: Cardiovascular Disease

## 2016-10-06 ENCOUNTER — Encounter: Payer: Self-pay | Admitting: Cardiovascular Disease

## 2016-10-06 VITALS — BP 128/64 | HR 75 | Ht 65.0 in | Wt 167.0 lb

## 2016-10-06 DIAGNOSIS — I6529 Occlusion and stenosis of unspecified carotid artery: Secondary | ICD-10-CM

## 2016-10-06 DIAGNOSIS — R0609 Other forms of dyspnea: Secondary | ICD-10-CM

## 2016-10-06 DIAGNOSIS — R0600 Dyspnea, unspecified: Secondary | ICD-10-CM

## 2016-10-06 DIAGNOSIS — R600 Localized edema: Secondary | ICD-10-CM

## 2016-10-06 DIAGNOSIS — I2510 Atherosclerotic heart disease of native coronary artery without angina pectoris: Secondary | ICD-10-CM | POA: Diagnosis not present

## 2016-10-06 DIAGNOSIS — I10 Essential (primary) hypertension: Secondary | ICD-10-CM | POA: Diagnosis not present

## 2016-10-06 DIAGNOSIS — E785 Hyperlipidemia, unspecified: Secondary | ICD-10-CM

## 2016-10-06 MED ORDER — FUROSEMIDE 40 MG PO TABS: 40.0000 mg | ORAL_TABLET | Freq: Every day | ORAL | 6 refills | Status: DC

## 2016-10-06 MED ORDER — SIMVASTATIN 40 MG PO TABS: 40.0000 mg | ORAL_TABLET | Freq: Every day | ORAL | 3 refills | Status: DC

## 2016-10-06 MED ORDER — FUROSEMIDE 40 MG PO TABS: 40.0000 mg | ORAL_TABLET | Freq: Every day | ORAL | 1 refills | Status: DC

## 2016-10-06 NOTE — Assessment & Plan Note (Signed)
History of hyperlipidemia on statin therapy followed by her PCP last lipid profile performed 01/04/16 revealed a total cholesterol 129, LDL 57 and HDL of 51.

## 2016-10-06 NOTE — Assessment & Plan Note (Signed)
History of CAD status post cardiac catheterization performed by myself 01/15/11 revealing noncritical CAD with a 60% mid LAD lesion and anterior takeoff RCA.

## 2016-10-06 NOTE — Addendum Note (Signed)
Addended by: Therisa Doyne on: 10/06/2016 11:30 AM   Modules accepted: Orders

## 2016-10-06 NOTE — Assessment & Plan Note (Signed)
Ms. Minnich's main complaint is that of dyspnea on exertion. She does have normal LV function with grade 2 diastolic dysfunction. Her Lasix was just increased because of peripheral edema and dyspnea. She had a negative Myoview stress test in August of last year and denies chest pain.

## 2016-10-06 NOTE — Progress Notes (Signed)
10/06/2016 Megan Hull   1940/06/20  [id number]  Primary Physician Aretta Nip, MD Primary Cardiologist: Lorretta Harp MD [name], [place]  HPI:  The patient is a delightful, 76 year old, mildly overweight, single [name] with no children whom I last saw 07/15/16.[name] Her problems include hypertension, hyperlipidemia, continued tobacco abuse of one-third pack per day, and family history of premature heart disease. She had a negative stress test on December 24, 2007. She was admitted to the [facility] on January 15, 2011, with jaw pain, thought to be an anginal equivalent. I catheterized her, revealing noncritical CAD with a 60% mid LAD lesion. She had anterior takeoff RCA. She has been asymptomatic since. She had carotid Dopplers performed at Dr. Roland Earl office which apparently were unremarkable. A lipid profile performed in March revealed total cholesterol of 142, LDL of 76, and HDL of 47. Her major complaint is of lower extremity edema, which gets worse during the day. I had done venous reflux studies on her back in November which were normal.since I saw her last a year and a half ago she has noticed increasing lower extremity edema which is dependent as well as increasing dyspnea on exertion. She denies chest pain. Her weight is stable same. She does admit to dietary indiscretion with regard to salt. She empirically increased her diuretic therapy. This resulted in improvement in her edema. She had recently come back from spending 5 weeks in [place] when I saw her last in July of last year and was anticipating going to [place]. I did do a Myoview stress test on her because of chest pain 01/12/16 which was normal. She developed an angioedema in August of last year probably related to her ARB. She didn't require intubation for airway protection. She had no allergy testing and ARB allergy was confirmed. She was begun on amlodipine which resulted in increasing lower  extremity edema, fluid retention and dyspnea. Her amlodipine was discontinued and she was begun on hydralazine. She returns today with complaints of increasing dyspnea on exertion and lower extremity edema. Her PCP recently increased her diuretic which resulted in some improvement in her edema.   Current Outpatient Prescriptions  Medication Sig Dispense Refill  . aspirin 81 MG tablet Take 162 mg by mouth daily.     . carvedilol (COREG) 12.5 MG tablet Take 1.5 tablets (18.75 mg total) by mouth 2 (two) times daily with a meal. 180 tablet 3  . cetirizine (ZYRTEC) 10 MG tablet Take 10 mg by mouth daily as needed for allergies.    . diphenhydrAMINE (BENADRYL ALLERGY) 25 MG tablet Take 2 tablets by mouth as needed for anaphylaxis.    [name] EPINEPHrine 0.3 mg/0.3 mL IJ SOAJ injection Inject 0.3 mLs (0.3 mg total) into the muscle once as needed (anaphylaxis). 2 Device 0  . furosemide (LASIX) 40 MG tablet Take 1 tablet (40 mg total) by mouth daily. 30 tablet 6  . hydrALAZINE (APRESOLINE) 25 MG tablet Take 1 tablet (25 mg total) by mouth 3 (three) times daily. 270 tablet 3  . isosorbide mononitrate (IMDUR) 30 MG 24 hr tablet Take 1 tablet (30 mg total) by mouth daily. 90 tablet 3  . montelukast (SINGULAIR) 10 MG tablet Take 10 mg by mouth at bedtime.    . simvastatin (ZOCOR) 40 MG tablet Take 40 mg by mouth daily.     No current facility-administered medications for this visit.     Allergies  Allergen Reactions  . Nexium [Esomeprazole  Magnesium] Anaphylaxis    Pt states she was unable to swallow for several hours after taking the nexium  . Other Hives, Shortness Of Breath and Other (See Comments)    Red Fish (Hives) Bojangle's chicken (swelling and shortness of breath) PAPRIKA  . Codeine Other (See Comments)    REACTION: chest pain  . Ace Inhibitors Swelling    Angioedema   . Monosodium Glutamate Other (See Comments)    dizziness  . Shellfish Allergy Hives  . Tape Rash    Coban wrap turned her  arm red (after stress test)    Social History   Social History  . Marital status: Single    Spouse name: N/A  . Number of children: 0  . Years of education: 12+   Occupational History  . [employer]    Social History Main Topics  . Smoking status: Former Smoker    Packs/day: 0.25    Years: 15.00    Types: Cigarettes    Quit date: 02/04/2016  . Smokeless tobacco: Never Used  . Alcohol use 12.6 oz/week    7 Shots of liquor, 14 Standard drinks or equivalent per week     Comment: every night scotch  . Drug use: No  . Sexual activity: Not on file   Other Topics Concern  . Not on file   Social History Narrative   Lives with sister   Caffeine use: 2 cups coffee per day (mixed decaf/regular)     Review of Systems: General: negative for chills, fever, night sweats or weight changes.  Cardiovascular: negative for chest pain, dyspnea on exertion, edema, orthopnea, palpitations, paroxysmal nocturnal dyspnea or shortness of breath Dermatological: negative for rash Respiratory: negative for cough or wheezing Urologic: negative for hematuria Abdominal: negative for nausea, vomiting, diarrhea, bright red blood per rectum, melena, or hematemesis Neurologic: negative for visual changes, syncope, or dizziness All other systems reviewed and are otherwise negative except as noted above.    Blood pressure 128/64, pulse 75, height 5\' 5"  (1.651 m), weight 167 lb (75.8 kg).  General appearance: alert and no distress Neck: no adenopathy, no JVD, supple, symmetrical, trachea midline, thyroid not enlarged, symmetric, no tenderness/mass/nodules and Soft right carotid bruit Lungs: clear to auscultation bilaterally Heart: regular rate and rhythm, S1, S2 normal, no murmur, click, rub or gallop Extremities: Trace bilateral lower extremity edema  EKG sinus rhythm at 75 without ST or T-wave changes. I personally reviewed this EKG  ASSESSMENT AND PLAN:   Essential hypertension, malignant History of  hypertension with blood pressure measured 120/64. She is on carvedilol and hydralazine. She had edema when she was on amlodipine. Continue current meds at current dosing  Hyperlipidemia History of hyperlipidemia on statin therapy followed by her PCP last lipid profile performed 01/04/16 revealed a total cholesterol 129, LDL 57 and HDL of 51.  Dyspnea on exertion Ms. Turski's main complaint is that of dyspnea on exertion. She does have normal LV function with grade 2 diastolic dysfunction. Her Lasix was just increased because of peripheral edema and dyspnea. She had a negative Myoview stress test in August of last year and denies chest pain.  Lower extremity edema History of large semi-edema recently addressed by her PCP by increasing her Lasix to 40 mg daily.  Coronary artery disease History of CAD status post cardiac catheterization performed by myself 01/15/11 revealing noncritical CAD with a 60% mid LAD lesion and anterior takeoff RCA.      Lorretta Harp MD FACP,FACC,FAHA, [facility] 10/06/2016 11:25 AM

## 2016-10-06 NOTE — Addendum Note (Signed)
Addended by: Therisa Doyne on: 10/06/2016 11:34 AM   Modules accepted: Orders

## 2016-10-06 NOTE — Patient Instructions (Addendum)
Medication Instructions: Increase Lasix (Furosemide) to 40 mg daily.   Labwork: Your physician recommends that you return for lab work in: 2 weeks--BMET  Testing: Your physician has requested that you have a carotid duplex. This test is an ultrasound of the carotid arteries in your neck. It looks at blood flow through these arteries that supply the brain with blood. Allow one hour for this exam. There are no restrictions or special instructions.  Follow-Up: We request that you follow-up in: 6 weeks with an extender and in 6 months with Dr Andria Rhein will receive a reminder letter in the mail two months in advance. If you don't receive a letter, please call our office to schedule the follow-up appointment.  If you need a refill on your cardiac medications before your next appointment, please call your pharmacy.

## 2016-10-06 NOTE — Assessment & Plan Note (Signed)
History of hypertension with blood pressure measured 120/64. She is on carvedilol and hydralazine. She had edema when she was on amlodipine. Continue current meds at current dosing

## 2016-10-06 NOTE — Assessment & Plan Note (Signed)
History of large semi-edema recently addressed by her PCP by increasing her Lasix to 40 mg daily.

## 2016-10-10 ENCOUNTER — Other Ambulatory Visit: Payer: Self-pay | Admitting: *Deleted

## 2016-10-16 ENCOUNTER — Other Ambulatory Visit: Payer: Self-pay

## 2016-10-16 MED ORDER — FUROSEMIDE 40 MG PO TABS: 40.0000 mg | ORAL_TABLET | Freq: Every day | ORAL | 3 refills | Status: DC

## 2016-10-19 ENCOUNTER — Ambulatory Visit (HOSPITAL_COMMUNITY)
Admission: RE | Admit: 2016-10-19 | Discharge: 2016-10-19 | Disposition: A | Discharge status: Home / Self Care | Payer: Medicare HMO | Source: Ambulatory Visit | Attending: Cardiovascular Disease | Admitting: Cardiovascular Disease

## 2016-10-19 DIAGNOSIS — I6529 Occlusion and stenosis of unspecified carotid artery: Secondary | ICD-10-CM | POA: Diagnosis present

## 2016-10-19 DIAGNOSIS — R0989 Other specified symptoms and signs involving the circulatory and respiratory systems: Secondary | ICD-10-CM | POA: Diagnosis not present

## 2016-10-19 DIAGNOSIS — I6523 Occlusion and stenosis of bilateral carotid arteries: Secondary | ICD-10-CM | POA: Diagnosis not present

## 2016-10-23 ENCOUNTER — Inpatient Hospital Stay (HOSPITAL_COMMUNITY): Admission: RE | Admit: 2016-10-23 | Payer: Medicare HMO | Source: Ambulatory Visit

## 2016-11-14 ENCOUNTER — Other Ambulatory Visit: Payer: Self-pay

## 2016-11-14 DIAGNOSIS — R0600 Dyspnea, unspecified: Secondary | ICD-10-CM

## 2016-11-14 DIAGNOSIS — R0609 Other forms of dyspnea: Secondary | ICD-10-CM

## 2016-11-14 DIAGNOSIS — E785 Hyperlipidemia, unspecified: Secondary | ICD-10-CM

## 2016-11-14 MED ORDER — HYDRALAZINE HCL 25 MG PO TABS: 25.0000 mg | ORAL_TABLET | Freq: Three times a day (TID) | ORAL | 3 refills | Status: DC

## 2016-11-16 DIAGNOSIS — I10 Essential (primary) hypertension: Secondary | ICD-10-CM | POA: Diagnosis not present

## 2016-11-16 DIAGNOSIS — R0609 Other forms of dyspnea: Secondary | ICD-10-CM | POA: Diagnosis not present

## 2016-11-16 DIAGNOSIS — I2510 Atherosclerotic heart disease of native coronary artery without angina pectoris: Secondary | ICD-10-CM | POA: Diagnosis not present

## 2016-11-16 DIAGNOSIS — E785 Hyperlipidemia, unspecified: Secondary | ICD-10-CM | POA: Diagnosis not present

## 2016-11-17 LAB — BASIC METABOLIC PANEL WITH GFR
BUN: 13 mg/dL (ref 7–25)
CALCIUM: 8.5 mg/dL — AB (ref 8.6–10.4)
CO2: 26 mmol/L (ref 20–31)
Chloride: 105 mmol/L (ref 98–110)
Creat: 0.9 mg/dL (ref 0.60–0.93)
GFR, EST NON AFRICAN AMERICAN: 62 mL/min (ref >=60)
GFR, Est African American: 72 mL/min (ref >=60)
GLUCOSE: 96 mg/dL (ref 65–99)
POTASSIUM: 4.2 mmol/L (ref 3.5–5.3)
Sodium: 140 mmol/L (ref 135–146)

## 2016-11-21 ENCOUNTER — Ambulatory Visit: Payer: Medicare HMO | Admitting: Physician Assistant

## 2016-11-23 DIAGNOSIS — J018 Other acute sinusitis: Secondary | ICD-10-CM | POA: Diagnosis not present

## 2016-11-23 DIAGNOSIS — Z91018 Allergy to other foods: Secondary | ICD-10-CM | POA: Diagnosis not present

## 2016-11-23 DIAGNOSIS — T783XXD Angioneurotic edema, subsequent encounter: Secondary | ICD-10-CM | POA: Diagnosis not present

## 2016-12-14 DIAGNOSIS — N951 Menopausal and female climacteric states: Secondary | ICD-10-CM | POA: Diagnosis not present

## 2016-12-14 DIAGNOSIS — Z01411 Encounter for gynecological examination (general) (routine) with abnormal findings: Secondary | ICD-10-CM | POA: Diagnosis not present

## 2017-01-16 DIAGNOSIS — I519 Heart disease, unspecified: Secondary | ICD-10-CM | POA: Diagnosis not present

## 2017-01-16 DIAGNOSIS — R0609 Other forms of dyspnea: Secondary | ICD-10-CM | POA: Diagnosis not present

## 2017-01-19 DIAGNOSIS — H40013 Open angle with borderline findings, low risk, bilateral: Secondary | ICD-10-CM | POA: Diagnosis not present

## 2017-01-19 DIAGNOSIS — H25813 Combined forms of age-related cataract, bilateral: Secondary | ICD-10-CM | POA: Diagnosis not present

## 2017-01-19 DIAGNOSIS — E119 Type 2 diabetes mellitus without complications: Secondary | ICD-10-CM | POA: Diagnosis not present

## 2017-03-06 ENCOUNTER — Other Ambulatory Visit: Payer: Self-pay | Admitting: Cardiovascular Disease

## 2017-03-29 ENCOUNTER — Ambulatory Visit (INDEPENDENT_AMBULATORY_CARE_PROVIDER_SITE_OTHER): Payer: Medicare HMO | Admitting: Cardiovascular Disease

## 2017-03-29 DIAGNOSIS — R0609 Other forms of dyspnea: Secondary | ICD-10-CM | POA: Diagnosis not present

## 2017-03-29 DIAGNOSIS — R600 Localized edema: Secondary | ICD-10-CM

## 2017-03-29 DIAGNOSIS — E7800 Pure hypercholesterolemia, unspecified: Secondary | ICD-10-CM

## 2017-03-29 DIAGNOSIS — I10 Essential (primary) hypertension: Secondary | ICD-10-CM | POA: Diagnosis not present

## 2017-03-29 DIAGNOSIS — R0600 Dyspnea, unspecified: Secondary | ICD-10-CM

## 2017-03-29 MED ORDER — CARVEDILOL 12.5 MG PO TABS: 18.7500 mg | ORAL_TABLET | Freq: Two times a day (BID) | ORAL | 3 refills | Status: DC

## 2017-03-29 MED ORDER — FUROSEMIDE 40 MG PO TABS: 40.0000 mg | ORAL_TABLET | Freq: Two times a day (BID) | ORAL | 1 refills | Status: DC

## 2017-03-29 NOTE — Patient Instructions (Signed)
Medication Instructions: Increase Lasix to 40 mg twice daily--one tab in the morning and one tab in the evening.  Labwork: Your physician recommends that you return for lab work in 2 weeks--BMET (to check kidney function).  Follow-Up: We request that you follow-up in: 6-8 weeks with an extender and in 6 months with Dr Andria Rhein will receive a reminder letter in the mail two months in advance. If you don't receive a letter, please call our office to schedule the follow-up appointment.  If you need a refill on your cardiac medications before your next appointment, please call your pharmacy.

## 2017-03-29 NOTE — Assessment & Plan Note (Signed)
History of progressive dyspnea on exertion. She is up 10 pounds and has lower extremity edema. She has diastolic dysfunction. I'm going to increase her furosemide from 40 mg once a day to twice a day. Will check lab work in 2 weeks and I will have her see a mid-level provider back in 6-8 weeks.

## 2017-03-29 NOTE — Assessment & Plan Note (Signed)
History of hyperlipidemia on statin therapy followed by her PCP. 

## 2017-03-29 NOTE — Assessment & Plan Note (Signed)
History of essential hypertension blood pressure measured at 118/60. She is on carvedilol, hydralazine. Continue current meds at current dosing.

## 2017-03-29 NOTE — Progress Notes (Signed)
03/29/2017 NICLE CONNOLE   1940-10-11  [id number]  Primary Physician Rankins, Bill Salinas, MD Primary Cardiologist: Lorretta Harp MD Garret Reddish, [place], [place]  HPI:  Megan Hull is a 76 y.o. female mildly overweight, single [name] with no children whom I last saw 10/06/16... Her problems include hypertension, hyperlipidemia, continued tobacco abuse of one-third pack per day, and family history of premature heart disease. She had a negative stress test on December 24, 2007. She was admitted to the [facility] on January 15, 2011, with jaw pain, thought to be an anginal equivalent. I catheterized her, revealing noncritical CAD with a 60% mid LAD lesion. She had anterior takeoff RCA. She has been asymptomatic since. She had carotid Dopplers performed at Dr. Roland Earl office which apparently were unremarkable. A lipid profile performed in March revealed total cholesterol of 142, LDL of 76, and HDL of 47. Her major complaint is of lower extremity edema, which gets worse during the day. I had done venous reflux studies on her back in November which were normal.since I saw her last a year and a half ago she has noticed increasing lower extremity edema which is dependent as well as increasing dyspnea on exertion. She denies chest pain. Her weight is stable same. She does admit to dietary indiscretion with regard to salt. She empirically increased her diuretic therapy. This resulted in improvement in her edema. She had recently come back from spending 5 weeks in [place] when I saw her last in July of last year and was anticipating going to [place]. I did do a Myoview stress test on her because of chest pain 01/12/16 which was normal. She developed an angioedema in August of last year probably related to her ARB. She didn't require intubation for airway protection. She had no allergy testing and ARB allergy was confirmed. She was begun on amlodipine which resulted in increasing lower  extremity edema, fluid retention and dyspnea. Her amlodipine was discontinued and she was begun on hydralazine. She returns today with complaints of increasing dyspnea on exertion and lower extremity edema. Her PCP recently increased her diuretic which resulted in some improvement in her edema. Since I saw her back 6 months ago she continues to complain of dyspnea on exertion which is progressive. Her weight is increased 10 pounds and she continues to have lower extremity edema. She does deny chest pain however.   Current Meds  Medication Sig  . aspirin 81 MG tablet Take 81 mg by mouth daily.   . carvedilol (COREG) 12.5 MG tablet Take 1.5 tablets (18.75 mg total) by mouth 2 (two) times daily with a meal.  . cetirizine (ZYRTEC) 10 MG tablet Take 10 mg by mouth 2 (two) times daily.   . Cholecalciferol (VITAMIN D3) 5000 units CAPS Take 1 capsule by mouth daily.  . diphenhydrAMINE (BENADRYL ALLERGY) 25 MG tablet Take 2 tablets by mouth as needed for anaphylaxis.  [name] EPINEPHrine 0.3 mg/0.3 mL IJ SOAJ injection Inject 0.3 mLs (0.3 mg total) into the muscle once as needed (anaphylaxis).  . furosemide (LASIX) 40 MG tablet TAKE 1 TABLET EVERY DAY  . hydrALAZINE (APRESOLINE) 25 MG tablet Take 1 tablet (25 mg total) by mouth 3 (three) times daily.  . isosorbide mononitrate (IMDUR) 30 MG 24 hr tablet Take 1 tablet (30 mg total) by mouth daily.  . montelukast (SINGULAIR) 10 MG tablet Take 10 mg by mouth at bedtime.  . simvastatin (ZOCOR) 40 MG tablet Take 1 tablet (40  mg total) by mouth daily.     Allergies  Allergen Reactions  . Nexium [Esomeprazole Magnesium] Anaphylaxis    Pt states she was unable to swallow for several hours after taking the nexium  . Other Hives, Shortness Of Breath and Other (See Comments)    Red Fish (Hives) Bojangle's chicken (swelling and shortness of breath) PAPRIKA  . Codeine Other (See Comments)    REACTION: chest pain  . Ace Inhibitors Swelling    Angioedema   .  Monosodium Glutamate Other (See Comments)    dizziness  . Amlodipine Other (See Comments)    Lower extremity swelling, itching  . Shellfish Allergy Hives  . Tape Rash    Coban wrap turned her arm red (after stress test)    Social History   Social History  . Marital status: Single    Spouse name: N/A  . Number of children: 0  . Years of education: 12+   Occupational History  . [employer]    Social History Main Topics  . Smoking status: Former Smoker    Packs/day: 0.25    Years: 15.00    Types: Cigarettes    Quit date: 02/04/2016  . Smokeless tobacco: Never Used  . Alcohol use 12.6 oz/week    7 Shots of liquor, 14 Standard drinks or equivalent per week     Comment: every night scotch  . Drug use: No  . Sexual activity: Not on file   Other Topics Concern  . Not on file   Social History Narrative   Lives with sister   Caffeine use: 2 cups coffee per day (mixed decaf/regular)     Review of Systems: General: negative for chills, fever, night sweats or weight changes.  Cardiovascular: negative for chest pain, dyspnea on exertion, edema, orthopnea, palpitations, paroxysmal nocturnal dyspnea or shortness of breath Dermatological: negative for rash Respiratory: negative for cough or wheezing Urologic: negative for hematuria Abdominal: negative for nausea, vomiting, diarrhea, bright red blood per rectum, melena, or hematemesis Neurologic: negative for visual changes, syncope, or dizziness All other systems reviewed and are otherwise negative except as noted above.    Blood pressure 118/60, pulse 74, height 5\' 5"  (1.651 m), weight 176 lb (79.8 kg).  General appearance: alert and no distress Neck: no adenopathy, no JVD, supple, symmetrical, trachea midline, thyroid not enlarged, symmetric, no tenderness/mass/nodules and Soft right carotid bruit Lungs: clear to auscultation bilaterally Heart: regular rate and rhythm, S1, S2 normal, no murmur, click, rub or gallop Extremities:  2+ right lower extremity edema, 1+ left lower extreme edema. Pulses: 2+ and symmetric Skin: Skin color, texture, turgor normal. No rashes or lesions Neurologic: Alert and oriented X 3, normal strength and tone. Normal symmetric reflexes. Normal coordination and gait  EKG sinus rhythm at 74 without ST or T-wave changes. I personally reviewed this EKG  ASSESSMENT AND PLAN:   Essential hypertension, malignant History of essential hypertension blood pressure measured at 118/60. She is on carvedilol, hydralazine. Continue current meds at current dosing.  Hyperlipidemia History of hyperlipidemia on statin therapy followed by her PCP  Dyspnea on exertion History of progressive dyspnea on exertion. She is up 10 pounds and has lower extremity edema. She has diastolic dysfunction. I'm going to increase her furosemide from 40 mg once a day to twice a day. Will check lab work in 2 weeks and I will have her see a mid-level provider back in 6-8 weeks.  Lower extremity edema Bilateral lower extremity edema right greater than left. We'll  increase her furosemide from 40 mg once a day to twice a day.      Lorretta Harp MD FACP,FACC,FAHA, [facility] 03/29/2017 4:23 PM

## 2017-03-29 NOTE — Assessment & Plan Note (Signed)
Bilateral lower extremity edema right greater than left. We'll increase her furosemide from 40 mg once a day to twice a day.

## 2017-04-03 ENCOUNTER — Encounter: Payer: Self-pay | Admitting: Cardiovascular Disease

## 2017-04-09 ENCOUNTER — Other Ambulatory Visit: Payer: Self-pay | Admitting: Family Medicine

## 2017-04-09 DIAGNOSIS — Z1231 Encounter for screening mammogram for malignant neoplasm of breast: Secondary | ICD-10-CM

## 2017-04-10 ENCOUNTER — Ambulatory Visit: Payer: Medicare HMO | Admitting: Cardiovascular Disease

## 2017-04-18 DIAGNOSIS — Z91018 Allergy to other foods: Secondary | ICD-10-CM | POA: Diagnosis not present

## 2017-04-23 DIAGNOSIS — E119 Type 2 diabetes mellitus without complications: Secondary | ICD-10-CM | POA: Diagnosis not present

## 2017-04-23 DIAGNOSIS — H40013 Open angle with borderline findings, low risk, bilateral: Secondary | ICD-10-CM | POA: Diagnosis not present

## 2017-04-23 DIAGNOSIS — H25813 Combined forms of age-related cataract, bilateral: Secondary | ICD-10-CM | POA: Diagnosis not present

## 2017-05-09 ENCOUNTER — Other Ambulatory Visit: Payer: Self-pay | Admitting: Cardiovascular Disease

## 2017-05-09 DIAGNOSIS — J0100 Acute maxillary sinusitis, unspecified: Secondary | ICD-10-CM | POA: Diagnosis not present

## 2017-05-09 NOTE — Telephone Encounter (Signed)
This is Dr. Berry's pt 

## 2017-05-10 NOTE — Telephone Encounter (Signed)
Rx(s) sent to pharmacy electronically.  

## 2017-05-11 ENCOUNTER — Other Ambulatory Visit: Payer: Self-pay | Admitting: Family Medicine

## 2017-05-11 ENCOUNTER — Ambulatory Visit
Admission: RE | Admit: 2017-05-11 | Discharge: 2017-05-11 | Disposition: A | Discharge status: Home / Self Care | Payer: Medicare HMO | Source: Ambulatory Visit | Attending: Family Medicine | Admitting: Family Medicine

## 2017-05-11 DIAGNOSIS — Z1231 Encounter for screening mammogram for malignant neoplasm of breast: Secondary | ICD-10-CM

## 2017-05-17 DIAGNOSIS — H2512 Age-related nuclear cataract, left eye: Secondary | ICD-10-CM | POA: Diagnosis not present

## 2017-05-28 DIAGNOSIS — I10 Essential (primary) hypertension: Secondary | ICD-10-CM | POA: Diagnosis not present

## 2017-05-28 DIAGNOSIS — E7800 Pure hypercholesterolemia, unspecified: Secondary | ICD-10-CM | POA: Diagnosis not present

## 2017-05-28 DIAGNOSIS — R600 Localized edema: Secondary | ICD-10-CM | POA: Diagnosis not present

## 2017-05-28 DIAGNOSIS — R0609 Other forms of dyspnea: Secondary | ICD-10-CM | POA: Diagnosis not present

## 2017-05-29 LAB — BASIC METABOLIC PANEL
BUN/Creatinine Ratio: 17 (ref 12–28)
BUN: 16 mg/dL (ref 8–27)
CALCIUM: 9.1 mg/dL (ref 8.7–10.3)
CO2: 25 mmol/L (ref 20–29)
CREATININE: 0.94 mg/dL (ref 0.57–1.00)
Chloride: 102 mmol/L (ref 96–106)
GFR, EST AFRICAN AMERICAN: 68 mL/min/{1.73_m2} (ref >59)
GFR, EST NON AFRICAN AMERICAN: 59 mL/min/{1.73_m2} — AB (ref >59)
Glucose: 98 mg/dL (ref 65–99)
POTASSIUM: 4.2 mmol/L (ref 3.5–5.2)
Sodium: 141 mmol/L (ref 134–144)

## 2017-05-31 ENCOUNTER — Ambulatory Visit (INDEPENDENT_AMBULATORY_CARE_PROVIDER_SITE_OTHER): Payer: Medicare HMO | Admitting: Cardiology

## 2017-05-31 ENCOUNTER — Encounter: Payer: Self-pay | Admitting: Cardiology

## 2017-05-31 VITALS — BP 140/68 | HR 77 | Ht 60.0 in | Wt 174.0 lb

## 2017-05-31 DIAGNOSIS — Z87898 Personal history of other specified conditions: Secondary | ICD-10-CM | POA: Diagnosis not present

## 2017-05-31 DIAGNOSIS — R600 Localized edema: Secondary | ICD-10-CM | POA: Diagnosis not present

## 2017-05-31 DIAGNOSIS — I10 Essential (primary) hypertension: Secondary | ICD-10-CM

## 2017-05-31 DIAGNOSIS — I2510 Atherosclerotic heart disease of native coronary artery without angina pectoris: Secondary | ICD-10-CM | POA: Diagnosis not present

## 2017-05-31 DIAGNOSIS — R0609 Other forms of dyspnea: Secondary | ICD-10-CM | POA: Diagnosis not present

## 2017-05-31 DIAGNOSIS — E785 Hyperlipidemia, unspecified: Secondary | ICD-10-CM

## 2017-05-31 DIAGNOSIS — I5032 Chronic diastolic (congestive) heart failure: Secondary | ICD-10-CM | POA: Diagnosis not present

## 2017-05-31 DIAGNOSIS — R0600 Dyspnea, unspecified: Secondary | ICD-10-CM

## 2017-05-31 LAB — BRAIN NATRIURETIC PEPTIDE: BNP: 66.9 pg/mL (ref 0.0–100.0)

## 2017-05-31 NOTE — Progress Notes (Signed)
05/31/2017 Megan Hull   Nov 09, 1940  [id number]  Primary Physician Via, Lennette Bihari, MD Primary Cardiologist: Dr Gwenlyn Found  HPI:  76 y/o female followed by Dr Gwenlyn Found. 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 She has a history of moderate CAD. A cath in 2012 showed 60% mLAD. A Myoview in Aug 2017 was low risk. Other medical problems include HTN, HLD and chronic LE edema. She had angioedema on an ARB in Aug 2017,  and has had worsening of her LE edema on Amlodipine. An echo done Feb 2018 showed normal LVF with grade 2 DD. Dr Gwenlyn Found last saw her in Oct and increased her Lasix to BID secondary to LE edema and dyspnea. She is in the office today for follow up. Since she saw Dr Gwenlyn Found she tells me she had to cut her Lasix back to 40/20 secondary to "cramping". A BMP done 05/28/17 showed a K+ of 4.2 and a BUN/SCr of 16/0.9. She says she still has DOE, no orthopnea. She quit smoking when she was admitted with angioedema in Aug 2017. She tells me she has felt like she has had DOE since then.    Current Outpatient Medications  Medication Sig Dispense Refill  . aspirin 81 MG tablet Take 81 mg by mouth daily.     . carvedilol (COREG) 12.5 MG tablet Take 1.5 tablets (18.75 mg total) by mouth 2 (two) times daily with a meal. 270 tablet 3  . cetirizine (ZYRTEC) 10 MG tablet Take 10 mg by mouth 2 (two) times daily.     . Cholecalciferol (VITAMIN D3) 5000 units CAPS Take 1 capsule by mouth daily.    . diphenhydrAMINE (BENADRYL ALLERGY) 25 MG tablet Take 2 tablets by mouth as needed for anaphylaxis.    [name] EPINEPHrine 0.3 mg/0.3 mL IJ SOAJ injection Inject 0.3 mLs (0.3 mg total) into the muscle once as needed (anaphylaxis). 2 Device 0  . furosemide (LASIX) 40 MG tablet Take 1 tablet (40 mg total) by mouth 2 (two) times daily. 180 tablet 1  . hydrALAZINE (APRESOLINE) 25 MG tablet Take 1 tablet (25 mg total) by mouth 3 (three) times daily. 270 tablet 3  . isosorbide mononitrate (IMDUR) 30  MG 24 hr tablet TAKE 1 TABLET (30 MG TOTAL) BY MOUTH DAILY. 90 tablet 3  . montelukast (SINGULAIR) 10 MG tablet Take 10 mg by mouth at bedtime.    . simvastatin (ZOCOR) 40 MG tablet Take 1 tablet (40 mg total) by mouth daily. 90 tablet 3   No current facility-administered medications for this visit.     Allergies  Allergen Reactions  . Nexium [Esomeprazole Magnesium] Anaphylaxis    Pt states she was unable to swallow for several hours after taking the nexium  . Other Hives, Shortness Of Breath and Other (See Comments)    Red Fish (Hives) Bojangle's chicken (swelling and shortness of breath) PAPRIKA  . Codeine Other (See Comments)    REACTION: chest pain  . Ace Inhibitors Swelling    Angioedema   . Monosodium Glutamate Other (See Comments)    dizziness  . Amlodipine Other (See Comments)    Lower extremity swelling, itching  . Shellfish Allergy Hives  . Tape Rash    Coban wrap turned her arm red (after stress test)    Past Medical History:  Diagnosis Date  . Arthritis   . Diabetes mellitus without complication (HCC)    borderline  . Diarrhea   . Diverticulosis   . Dysrhythmia  controlled with Metoprolol-12-19-13 LOV -Dr. Gwenlyn Found sees yearly  . Family history of premature CAD   . GERD (gastroesophageal reflux disease)   . Heart murmur    yrs ago  . HLD (hyperlipidemia)   . Hypertension   . Lower extremity edema   . Status post dilation of esophageal narrowing   . Supraventricular arrhythmia   . Tobacco abuse     Social History   Socioeconomic History  . Marital status: Single    Spouse name: Not on file  . Number of children: 0  . Years of education: 12+  . Highest education level: Not on file  Social Needs  . Financial resource strain: Not on file  . Food insecurity - worry: Not on file  . Food insecurity - inability: Not on file  . Transportation needs - medical: Not on file  . Transportation needs - non-medical: Not on file  Occupational History  .  Occupation: Re[employer]red  Tobacco Use  . Smoking status: Former Smoker    Packs/day: 0.25    Years: 15.00    Pack years: 3.75    Types: Cigarettes    Last attempt to quit: 02/04/2016    Years since quitting: 1.3  . Smokeless tobacco: Never Used  Substance and Sexual Activity  . Alcohol use: Yes    Alcohol/week: 12.6 oz    Types: 7 Shots of liquor, 14 Standard drinks or equivalent per week    Comment: every night scotch  . Drug use: No  . Sexual activity: Not on file  Other Topics Concern  . Not on file  Social History Narrative   Lives with sister   Caffeine use: 2 cups coffee per day (mixed decaf/regular)     Family History  Problem Relation Age of Onset  . Heart disease Brother        MI @ 32, HTN @ 38, DM @ 62   . Heart disease Father        MI  . Heart disease Mother        MI, CVA @ 41  . Heart disease Sister        CHF, CVA  . Breast cancer Sister 67  . Esophageal cancer Brother   . Alzheimer's disease Sister   . Heart disease Brother        MI in 21s, lung cancer  . Heart disease Sister   . Hypertension Sister        x2  . Hyperlipidemia Sister        x2  . Diabetes Sister        x2  . Cancer Sister   . Hypertension Brother   . Colon cancer Neg Hx      Review of Systems: General: negative for chills, fever, night sweats or weight changes.  Cardiovascular: negative for chest pain, dyspnea on exertion, edema, orthopnea, palpitations, paroxysmal nocturnal dyspnea or shortness of breath Dermatological: negative for rash Respiratory: negative for cough or wheezing Urologic: negative for hematuria Abdominal: negative for nausea, vomiting, diarrhea, bright red blood per rectum, melena, or hematemesis Neurologic: negative for visual changes, syncope, or dizziness All other systems reviewed and are otherwise negative except as noted above.    Blood pressure 140/68, pulse 77, height 5' (1.524 m), weight 174 lb (78.9 kg).  General appearance: alert, cooperative,  no distress and moderately obese Neck: no carotid bruit and no JVD Lungs: decreased BS at bases, no rales or wheezing Heart: regular rate and rhythm and extra systole  noted, soft systolic murmur AOV, LSB Extremities: trace RLE edema Skin: Skin color, texture, turgor normal. No rashes or lesions Neurologic: Grossly normal   ASSESSMENT AND PLAN:   DOE- This is her main complaint. Her HTN and diastolic CHF appear to have been treated but her symptoms of dyspnea remain.   Chronic diastolic CHF EF 30-09% with grade 2 DD in feb 2018  Lower extremity edema Chronic- mild- venous dopplers negative for DVT Nov 2017   Essential hypertension Under control B/P by me 124/66  Left foot drop After her hospitalization in Aug 2017- felt to be positionalnerve compression while intubated  Angioedema Admitted with angioedema secondary to ARB in Aug 2017- required 48 hrs of intubation Subsequent allergy testing confirms angioedema from ARB/ACE also red meat allergy  Coronary artery disease CAD- 60% mLAD Aug 2012. Myoview low risk Aug 2017  Hyperlipidemia On statin therapy. PCP following.  History of smoking- 0.25 ppd for at least 15 years- quit Aug 2017   PLAN  I'll check a BNP today. The pt asked about pulmonary evaluation for her persistent dyspnea despite apparent treatment of her diastolic CHF and feel that is certainly reasonable. If her BNP is normal or near normal I will make a referral to pulmonary for evaluation.   Kerin Ransom PA-C 05/31/2017 10:55 AM

## 2017-05-31 NOTE — Patient Instructions (Signed)
Lab work today  ( bnp )    Your physician wants you to follow-up in 6 months with Dr.Berry. You will receive a reminder letter in the mail two months in advance. If you don't receive a letter, please call our office to schedule the follow-up appointment.

## 2017-06-01 ENCOUNTER — Other Ambulatory Visit: Payer: Self-pay

## 2017-06-01 DIAGNOSIS — R0602 Shortness of breath: Secondary | ICD-10-CM

## 2017-06-01 IMAGING — MR MR HEAD W/O CM
8 of 9 series · 36 of 48 positions shown · non-contrast
Comparison: None.

CLINICAL DATA: 74 y/o F; bilateral visual disturbance and question
of posterior reversible encephalopathy syndrome.

EXAM:
MRI HEAD WITHOUT CONTRAST
TECHNIQUE: Multiplanar, multiecho pulse sequences of the brain and surrounding
structures were obtained without intravenous contrast.

[Series 3: DWI · axial · 3.0mm · 0.94mm/px · z∈[-131,+12]mm · 9 of 98 slices shown (1 of 2)]
[im 1/98]
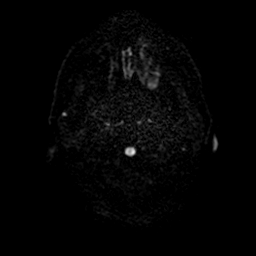
[im 13/98]
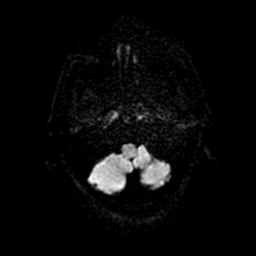
[im 25/98]
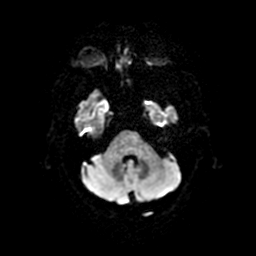
[im 37/98]
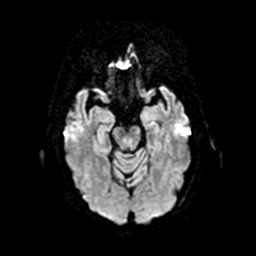
[im 49/98]
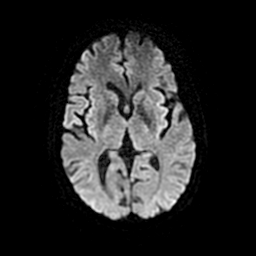
[im 61/98]
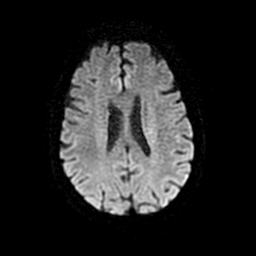
[im 73/98]
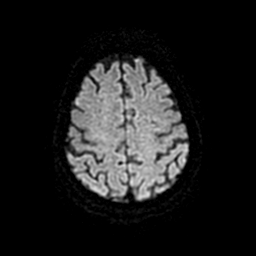
[im 85/98]
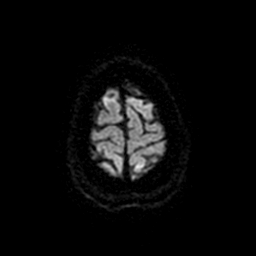
[im 98/98]
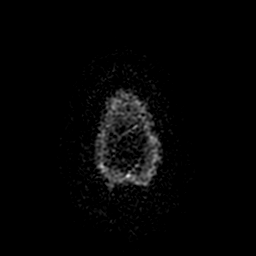

[Series 5: T2 · axial · 5.0mm · 0.43mm/px · z∈[-130,+12]mm · 3 of 25 slices shown]
[im 1/25]
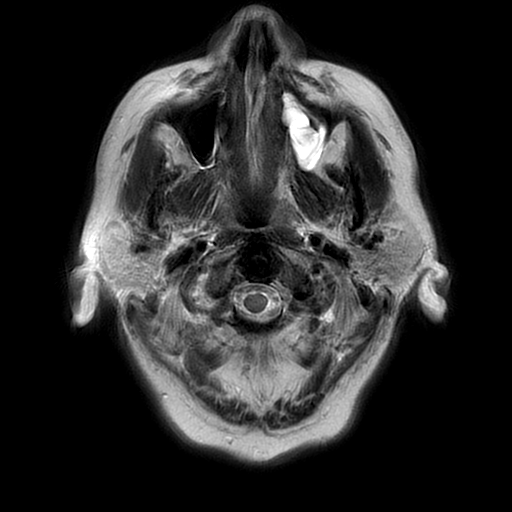
[im 13/25]
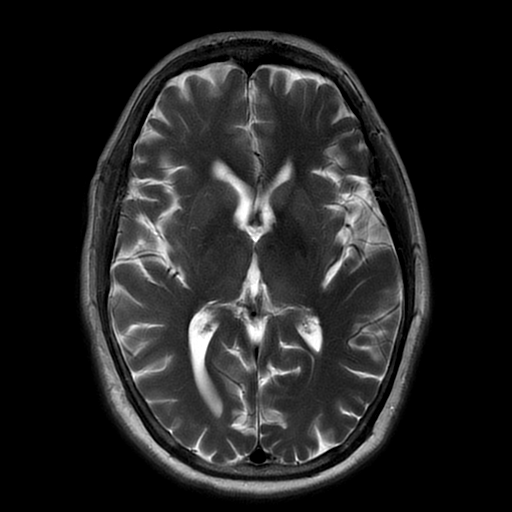
[im 25/25]
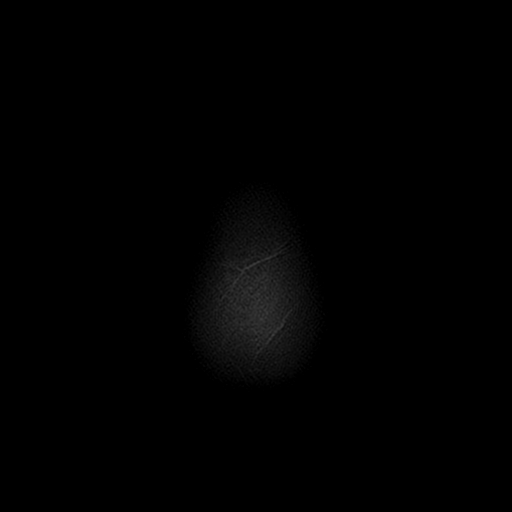

[Series 6: FLAIR · axial · 5.0mm · 0.43mm/px · z∈[-130,+12]mm · 3 of 25 slices shown (1 of 2)]
[im 1/25]
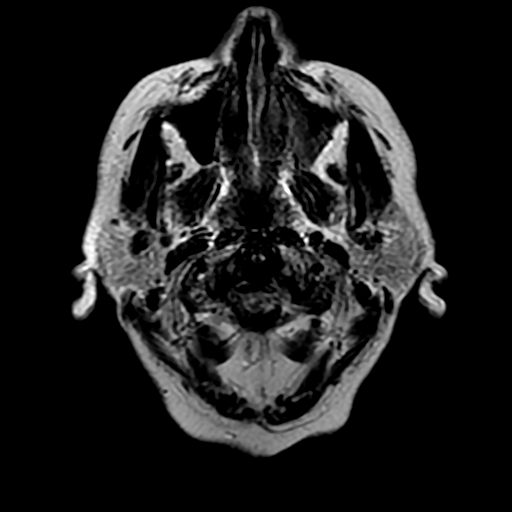
[im 13/25]
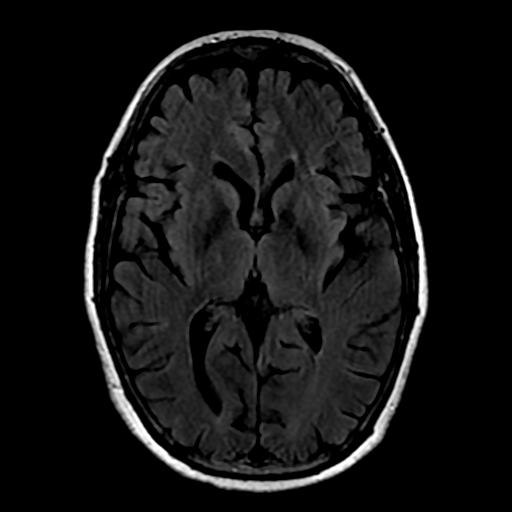
[im 25/25]
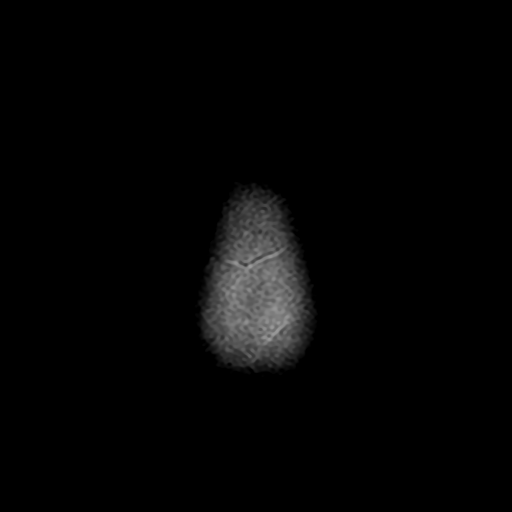

[Series 7: FLAIR · sagittal · 5.0mm · 0.47mm/px · 2 of 23 slices shown (2 of 2)]
[im 1/23]
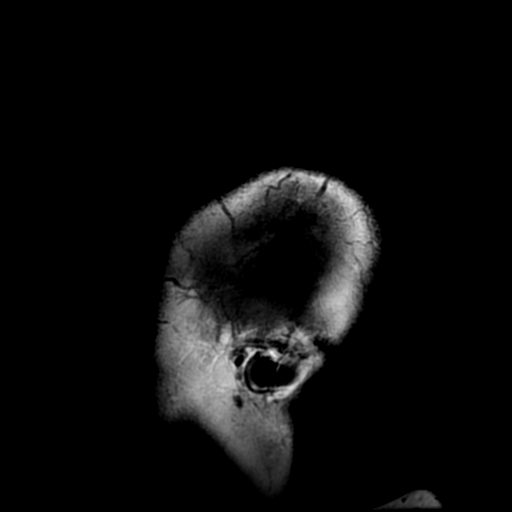
[im 23/23]
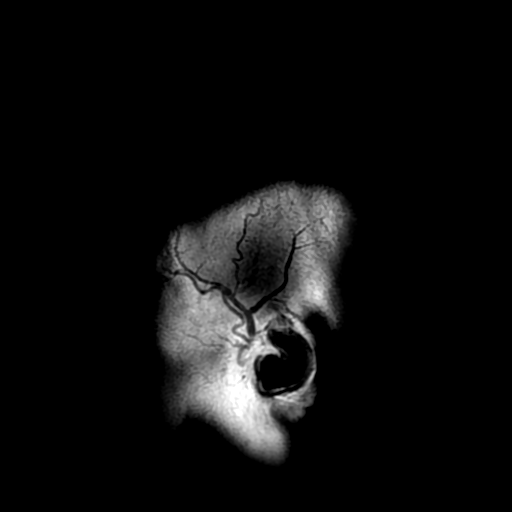

[Series 8: DWI · coronal · 4.0mm · 0.94mm/px · 7 of 72 slices shown (2 of 2)]
[im 1/72]
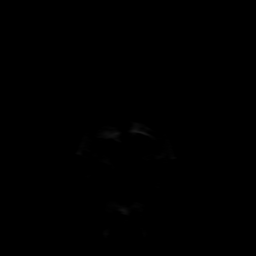
[im 12/72]
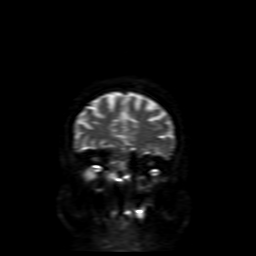
[im 24/72]
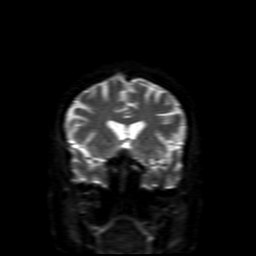
[im 36/72]
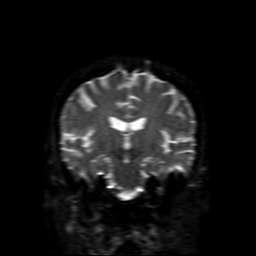
[im 48/72]
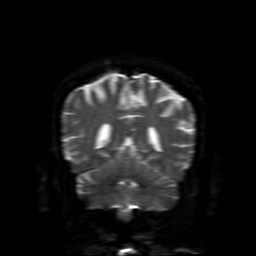
[im 60/72]
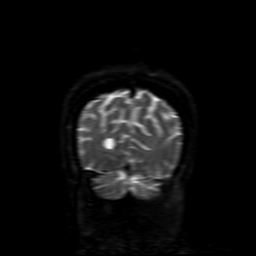
[im 72/72]
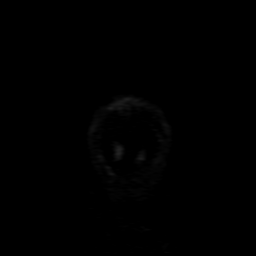

[Series 9: (person_name) · axial · 3.0mm · 0.47mm/px · z∈[-132,-83]mm · 3 of 100 slices shown]
[im 1/100]
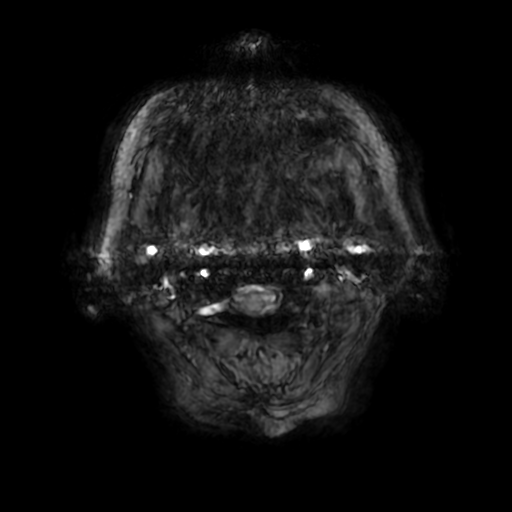
[im 12/100]
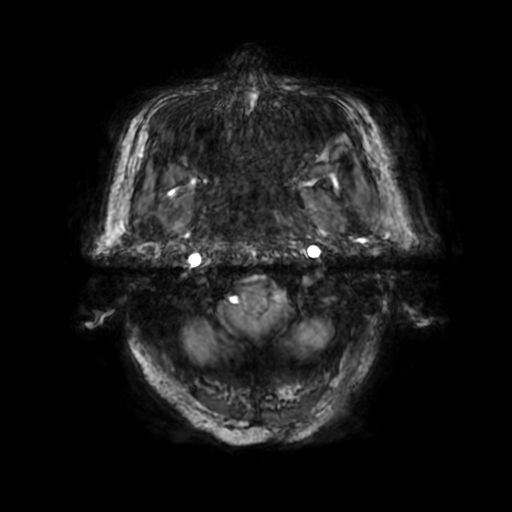
[im 34/100]
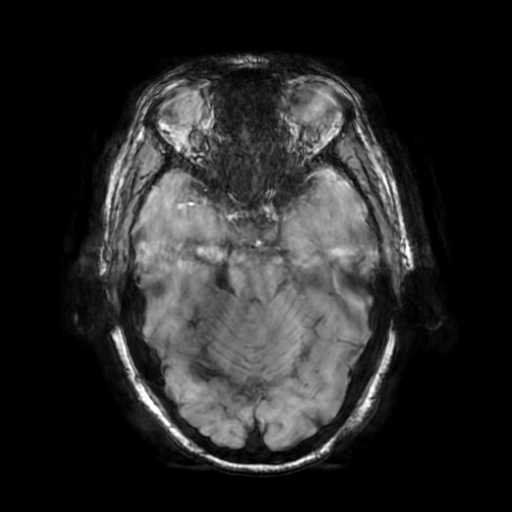

[Series 350: ADC · axial · 3.0mm · 0.94mm/px · z∈[-131,+12]mm · 5 of 49 slices shown (1 of 2)]
[im 1/49]
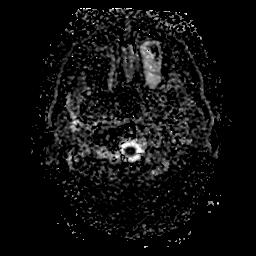
[im 13/49]
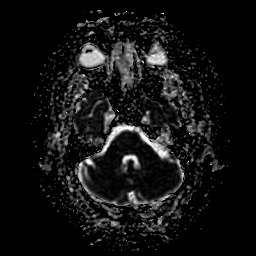
[im 25/49]
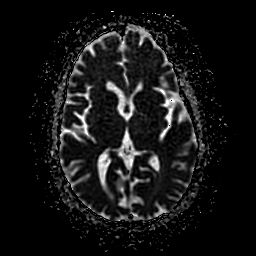
[im 37/49]
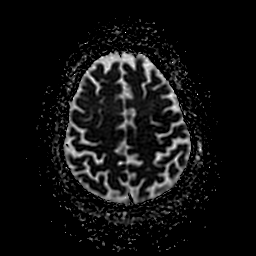
[im 49/49]
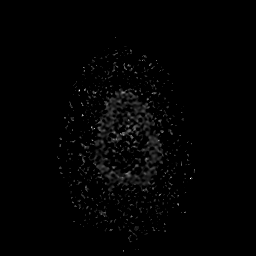

[Series 850: ADC · coronal · 4.0mm · 0.94mm/px · 4 of 36 slices shown (2 of 2)]
[im 1/36]
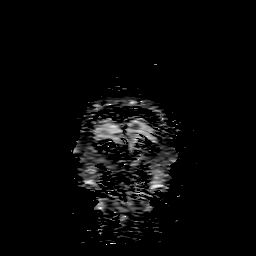
[im 12/36]
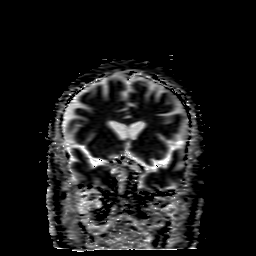
[im 24/36]
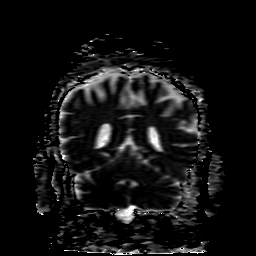
[im 36/36]
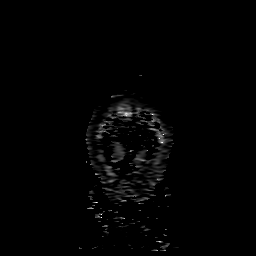

[36 of 48 positions shown; findings below may reference images not displayed]

FINDINGS: Brain: No diffusion restriction to suggest acute infarct. No
abnormal susceptibility hypointensity to indicate intracranial
hemorrhage. No significant T2 FLAIR signal abnormality. No focal
mass effect.

Extra-axial space: Normal ventricular size. No midline shift. No
effacement of basilar cisterns. No extra-axial collection is
identified. Proximal intracranial flow voids are maintained. No
abnormality of the cervical medullary junction.

Other: Mucosal thickening within the left maxillary sinus, anterior
ethmoid sinuses, bilateral sphenoid sinuses, and left maxillary
sinus mucous retention cyst. Minimal opacification of the left
mastoid tip. Orbits are unremarkable. Calvarium is unremarkable.
IMPRESSION: No acute intracranial abnormality is identified. No evidence of
PRES. Moderate paranasal sinus disease.

By: Jhon Elver Arrollave M.D.

## 2017-06-11 ENCOUNTER — Telehealth: Payer: Self-pay | Admitting: Cardiovascular Disease

## 2017-06-11 NOTE — Telephone Encounter (Signed)
Patient states that she is returning a call and asks that we call back after 1pm today.

## 2017-06-13 NOTE — Telephone Encounter (Signed)
Returned the call to the patient. She stated that she had been given her lab results and did not require anything further.

## 2017-06-26 ENCOUNTER — Encounter: Payer: Self-pay | Admitting: Pulmonary Disease

## 2017-06-26 ENCOUNTER — Ambulatory Visit (INDEPENDENT_AMBULATORY_CARE_PROVIDER_SITE_OTHER)
Admission: RE | Admit: 2017-06-26 | Discharge: 2017-06-26 | Disposition: A | Discharge status: Home / Self Care | Payer: Medicare HMO | Source: Ambulatory Visit | Attending: Pulmonary Disease | Admitting: Pulmonary Disease

## 2017-06-26 ENCOUNTER — Ambulatory Visit: Payer: Medicare HMO | Admitting: Pulmonary Disease

## 2017-06-26 VITALS — BP 104/60 | HR 71 | Ht 60.0 in | Wt 173.0 lb

## 2017-06-26 DIAGNOSIS — K222 Esophageal obstruction: Secondary | ICD-10-CM

## 2017-06-26 DIAGNOSIS — R0602 Shortness of breath: Secondary | ICD-10-CM

## 2017-06-26 DIAGNOSIS — R0600 Dyspnea, unspecified: Secondary | ICD-10-CM

## 2017-06-26 NOTE — Progress Notes (Signed)
Subjective:    Patient ID: Megan Hull, female    DOB: [birth year], 77 y.o.   MRN: [id number]  HPI Chief Complaint  Patient presents with  . Pulm Consult    Referred by Kerin Ransom for SOB. States this has been going on for the past year. Has noticed a voice change. Does not have any SOB during exercise. Notices the SOB with talking and bending over.     77 year old ex-smoker presents for evaluation of dyspnea. She started smoking in her 45s, smoked about 5 cigarettes a day for 20 years, about 5 pack years she reports episodic.  Dyspnea sometimes when talking or turning around in bed, sleeps last for a few minutes and then spontaneously resolves.  She works out at [employer] where she walks a mile and uses leg strengthening machines and denies any shortness of breath during [place] of exertion.  She reports raspiness in her voice.  The symptoms symptoms started after an episode of anaphylactic reaction and angioedema in 01/2016 when she required intubation for 4 days.  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  She recovered from this well but her left lower extremity was weak and no clear cause was found in spite of MRI and other testing.  She otherwise has esophageal stricture that was last dilated 12/2012. She follows with cardiology for diastolic dysfunction, noted on echo 07/2016 and takes Lasix daily also hydralazine and nitrates  She denies wheezing, cough.  She reports occasional dysphagia.  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 Arthritis   . Diabetes mellitus without complication (HCC)    borderline  . Diarrhea   . Diverticulosis   . Dysrhythmia    controlled with  Metoprolol-12-19-13 LOV -Dr. Gwenlyn Found sees yearly  . Family history of premature CAD   . GERD (gastroesophageal reflux disease)   . Heart murmur    yrs ago  . HLD (hyperlipidemia)   . Hypertension   . Lower extremity edema   . Status post dilation of esophageal narrowing   . Supraventricular arrhythmia   . Tobacco abuse      Past Surgical History:  Procedure Laterality Date  . ANTERIOR CERVICAL DECOMP/DISCECTOMY FUSION N/A 06/08/2015   Procedure: CERVICAL FIVE-SIX ,CERVICAL SIX-SEVEN ANTERIOR CERVICAL DECOMPRESSION/DISCECTOMY FUSION PLATING BONEGRAFT;  Surgeon: Kristeen Miss, MD;  Location: [facility];  Service: Neurosurgery;  Laterality: N/A;  . 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 Adora Fridge)  . COLONOSCOPY    . ESOPHAGEAL DILATION  2014  . infected lymph node surgery  80's  . NM MYOCAR PERF WALL MOTION  04/2011   bruce myoview; normal pattern of perfusion, EF 80%, low risk scan  . TONSILLECTOMY  70's  . TRANSTHORACIC ECHOCARDIOGRAM  12/2007   mild conc LVH    Allergies  Allergen Reactions  . Nexium [Esomeprazole Magnesium] Anaphylaxis    Pt states she was unable to swallow for several hours after taking the nexium  . Other Hives, Shortness Of Breath and Other (See Comments)    Red Fish (Hives) Bojangle's chicken (swelling and shortness of breath) PAPRIKA  . Codeine Other (See Comments)    REACTION: chest pain  . Ace Inhibitors Swelling  Angioedema   . Latex Other (See Comments)    Causes skin to turn red per patient   . Monosodium Glutamate Other (See Comments)    dizziness  . Amlodipine Other (See Comments)    Lower extremity swelling, itching  . Shellfish Allergy Hives  . Tape Rash    Coban wrap turned her arm red (after stress test)    Social History   Socioeconomic History  . Marital status: Single    Spouse name: Not on file  . Number of children: 0  . Years of education: 12+  . Highest education level: Not on  file  Social Needs  . Financial resource strain: Not on file  . Food insecurity - worry: Not on file  . Food insecurity - inability: Not on file  . Transportation needs - medical: Not on file  . Transportation needs - non-medical: Not on file  Occupational History  . Occupation: [employer]  Tobacco Use  . Smoking status: Former Smoker    Packs/day: 0.25    Years: 15.00    Pack years: 3.75    Types: Cigarettes    Last attempt to quit: 02/04/2016    Years since quitting: 1.3  . Smokeless tobacco: Never Used  Substance and Sexual Activity  . Alcohol use: Yes    Alcohol/week: 12.6 oz    Types: 7 Shots of liquor, 14 Standard drinks or equivalent per week    Comment: every night scotch  . Drug use: No  . Sexual activity: Not on file  Other Topics Concern  . Not on file  Social History Narrative   Lives with sister   Caffeine use: 2 cups coffee per day (mixed decaf/regular)      Family History  Problem Relation Age of Onset  . Heart disease Brother        MI @ 32, HTN @ 67, DM @ 52   . Heart disease Father        MI  . Heart disease Mother        MI, CVA @ 43  . Heart disease Sister        CHF, CVA  . Breast cancer Sister 32  . Esophageal cancer Brother   . Alzheimer's disease Sister   . Heart disease Brother        MI in 47s, lung cancer  . Heart disease Sister   . Hypertension Sister        x2  . Hyperlipidemia Sister        x2  . Diabetes Sister        x2  . Cancer Sister   . Hypertension Brother   . Colon cancer Neg Hx     Review of Systems   Positive as above for subscapular pain, shortness of breath at rest, -dysphagia, feet swelling  Constitutional: negative for anorexia, fevers and sweats  Eyes: negative for irritation, redness and visual disturbance  Ears, nose, mouth, throat, and face: negative for earaches, epistaxis, nasal congestion and sore throat  Respiratory: negative for cough, dyspnea on exertion, sputum and wheezing  Cardiovascular:  negative for chest pain,  orthopnea, palpitations and syncope  Gastrointestinal: negative for abdominal pain, constipation, diarrhea, melena, nausea and vomiting  Genitourinary:negative for dysuria, frequency and hematuria  Hematologic/lymphatic: negative for bleeding, easy bruising and lymphadenopathy  Musculoskeletal:negative for arthralgias, muscle weakness and stiff joints  Neurological: negative for coordination problems, gait problems, headaches and weakness  Endocrine: negative for diabetic symptoms including polydipsia, polyuria and weight  loss     Objective:   Physical Exam  Gen. Pleasant, well-nourished, in no distress, normal affect ENT - no lesions, no post nasal drip Neck: No JVD, no thyromegaly, no carotid bruits Lungs: no use of accessory muscles, no dullness to percussion, clear without rales or rhonchi  Cardiovascular: Rhythm regular, heart sounds  normal, no murmurs or gallops, no peripheral edema Abdomen: soft and non-tender, no hepatosplenomegaly, BS normal. Musculoskeletal: No deformities, no cyanosis or clubbing Neuro:  alert, non focal       Assessment & Plan:

## 2017-06-26 NOTE — Assessment & Plan Note (Signed)
Significantly, her dyspnea does not seem to be on exertion but at rest. She does not seem to have significant anxiety.  Onset of symptoms after episode of mechanical ventilation makes me suspect tracheal stenosis but she has no wheezing and again this is not an exertional problem. If her symptoms persist, may suggest ENT evaluation of vocal cords.  May be related to vocal cord dysfunction or actual injury to the cords with intubation.  Spirometry only shows mild restriction, does not have any evidence of pulmonary fibrosis

## 2017-06-26 NOTE — Patient Instructions (Signed)
Chest x-ray today. Spirometry shows normal lung function

## 2017-06-26 NOTE — Assessment & Plan Note (Signed)
If dysphagia worsens, she may need GI referral but again doubt that this is playing into her complaints of dyspnea

## 2017-07-03 DIAGNOSIS — Z0000 Encounter for general adult medical examination without abnormal findings: Secondary | ICD-10-CM | POA: Diagnosis not present

## 2017-07-03 DIAGNOSIS — E785 Hyperlipidemia, unspecified: Secondary | ICD-10-CM | POA: Diagnosis not present

## 2017-07-03 DIAGNOSIS — I10 Essential (primary) hypertension: Secondary | ICD-10-CM | POA: Diagnosis not present

## 2017-07-03 DIAGNOSIS — Z136 Encounter for screening for cardiovascular disorders: Secondary | ICD-10-CM | POA: Diagnosis not present

## 2017-07-03 DIAGNOSIS — I5032 Chronic diastolic (congestive) heart failure: Secondary | ICD-10-CM | POA: Diagnosis not present

## 2017-07-03 DIAGNOSIS — M8580 Other specified disorders of bone density and structure, unspecified site: Secondary | ICD-10-CM | POA: Diagnosis not present

## 2017-07-03 DIAGNOSIS — Z91018 Allergy to other foods: Secondary | ICD-10-CM | POA: Diagnosis not present

## 2017-07-03 DIAGNOSIS — R7303 Prediabetes: Secondary | ICD-10-CM | POA: Diagnosis not present

## 2017-07-03 DIAGNOSIS — I2510 Atherosclerotic heart disease of native coronary artery without angina pectoris: Secondary | ICD-10-CM | POA: Diagnosis not present

## 2017-07-03 DIAGNOSIS — Z23 Encounter for immunization: Secondary | ICD-10-CM | POA: Diagnosis not present

## 2017-07-03 DIAGNOSIS — E559 Vitamin D deficiency, unspecified: Secondary | ICD-10-CM | POA: Diagnosis not present

## 2017-07-23 ENCOUNTER — Other Ambulatory Visit: Payer: Self-pay | Admitting: Cardiovascular Disease

## 2017-07-23 NOTE — Telephone Encounter (Signed)
REFILL 

## 2017-08-21 ENCOUNTER — Other Ambulatory Visit: Payer: Self-pay | Admitting: Cardiovascular Disease

## 2017-09-05 ENCOUNTER — Other Ambulatory Visit: Payer: Self-pay | Admitting: Cardiovascular Disease

## 2017-09-05 DIAGNOSIS — R0600 Dyspnea, unspecified: Secondary | ICD-10-CM

## 2017-09-05 DIAGNOSIS — E785 Hyperlipidemia, unspecified: Secondary | ICD-10-CM

## 2017-09-05 DIAGNOSIS — R0609 Other forms of dyspnea: Secondary | ICD-10-CM

## 2017-10-11 DIAGNOSIS — H40013 Open angle with borderline findings, low risk, bilateral: Secondary | ICD-10-CM | POA: Diagnosis not present

## 2017-10-11 DIAGNOSIS — E119 Type 2 diabetes mellitus without complications: Secondary | ICD-10-CM | POA: Diagnosis not present

## 2017-10-11 DIAGNOSIS — H25813 Combined forms of age-related cataract, bilateral: Secondary | ICD-10-CM | POA: Diagnosis not present

## 2017-10-11 DIAGNOSIS — H10413 Chronic giant papillary conjunctivitis, bilateral: Secondary | ICD-10-CM | POA: Diagnosis not present

## 2017-10-11 DIAGNOSIS — Z961 Presence of intraocular lens: Secondary | ICD-10-CM | POA: Diagnosis not present

## 2017-11-30 DIAGNOSIS — Z91018 Allergy to other foods: Secondary | ICD-10-CM | POA: Diagnosis not present

## 2017-12-05 DIAGNOSIS — H6691 Otitis media, unspecified, right ear: Secondary | ICD-10-CM | POA: Diagnosis not present

## 2017-12-05 DIAGNOSIS — L501 Idiopathic urticaria: Secondary | ICD-10-CM | POA: Diagnosis not present

## 2017-12-05 DIAGNOSIS — Z91018 Allergy to other foods: Secondary | ICD-10-CM | POA: Diagnosis not present

## 2017-12-05 DIAGNOSIS — J302 Other seasonal allergic rhinitis: Secondary | ICD-10-CM | POA: Diagnosis not present

## 2017-12-10 ENCOUNTER — Other Ambulatory Visit: Payer: Self-pay | Admitting: Cardiovascular Disease

## 2018-01-24 ENCOUNTER — Encounter: Payer: Self-pay | Admitting: Pulmonary Disease

## 2018-01-24 ENCOUNTER — Ambulatory Visit: Payer: Medicare HMO | Admitting: Pulmonary Disease

## 2018-01-24 VITALS — BP 118/70 | HR 75 | Ht 64.0 in | Wt 173.0 lb

## 2018-01-24 DIAGNOSIS — R490 Dysphonia: Secondary | ICD-10-CM | POA: Diagnosis not present

## 2018-01-24 DIAGNOSIS — J019 Acute sinusitis, unspecified: Secondary | ICD-10-CM | POA: Insufficient documentation

## 2018-01-24 DIAGNOSIS — J0110 Acute frontal sinusitis, unspecified: Secondary | ICD-10-CM | POA: Diagnosis not present

## 2018-01-24 DIAGNOSIS — R0600 Dyspnea, unspecified: Secondary | ICD-10-CM

## 2018-01-24 MED ORDER — AZITHROMYCIN 250 MG PO TABS: ORAL_TABLET | ORAL | 0 refills | Status: DC

## 2018-01-24 NOTE — Progress Notes (Signed)
   Subjective:    Patient ID: Megan Hull, female    DOB: May 31, 1941, 77 y.o.   MRN: [id number]  HPI  77 year old ex-smoker  for FU of dyspnea. She started smoking in her 42s, smoked about 5 cigarettes a day for 20 years, about 5 pack years . The symptoms started after an episode of anaphylactic reaction and angioedema in 01/2016 when she required intubation for 4 days.  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  She recovered from this well but her left lower extremity was weak and no clear cause was found in spite of MRI and other testing.  She has esophageal stricture that was last dilated 12/2012. She follows with cardiology for chronic diastolic heart failure, noted on echo 07/2016 and takes Lasix daily also hydralazine and nitrates  Follow-up after 7 months today, she remains short of breath especially after carrying something up the stairs or sometimes when bending down.  Interestingly she can walk by herself on level ground for 30 minutes and not be short of breath. She denies excessive daytime tiredness or fatigue Pedal edema is controlled on Lasix, no wheezing or recurrent chest colds. She does report hoarseness in her voice that has persisted  She also reports head cold with yellow nasal discharge but is persisted for a week    Significant tests/ events reviewed  Spirometry 06/2017 mild restriction, no obstructive lung disease  Review of Systems Patient denies significant dyspnea,cough, hemoptysis,  chest pain, palpitations, pedal edema, orthopnea, paroxysmal nocturnal dyspnea, lightheadedness, nausea, vomiting, abdominal or  leg pains      Objective:   Physical Exam  Gen. Pleasant, well-nourished, in no distress ENT - no thrush, no post nasal drip Neck: No JVD, no thyromegaly, no carotid bruits Lungs: no use of accessory muscles, no dullness to percussion, clear without rales or rhonchi  Cardiovascular: Rhythm  regular, heart sounds  normal, no murmurs or gallops, no peripheral edema Musculoskeletal: No deformities, no cyanosis or clubbing        Assessment & Plan:

## 2018-01-24 NOTE — Patient Instructions (Signed)
Zpak Store brand decongestant (eg walphed) x 7 days  ENT consult for hoarseness r/o upper airway cause / intubation trauma  Consider cardiac rehab

## 2018-01-24 NOTE — Assessment & Plan Note (Signed)
No clear cause identified. Would need to rule out upper airway cause and will refer to ENT given her history of onset of symptoms after intubation  If that is normal then would consider strength training program under cardiac rehab Doubt pulmonary cause of dyspnea

## 2018-01-24 NOTE — Assessment & Plan Note (Signed)
Zpak Store brand decongestant (eg walphed) x 7 days

## 2018-02-02 DIAGNOSIS — I10 Essential (primary) hypertension: Secondary | ICD-10-CM | POA: Diagnosis not present

## 2018-02-02 DIAGNOSIS — R079 Chest pain, unspecified: Secondary | ICD-10-CM | POA: Diagnosis not present

## 2018-02-02 DIAGNOSIS — R55 Syncope and collapse: Secondary | ICD-10-CM | POA: Diagnosis not present

## 2018-02-02 DIAGNOSIS — R0600 Dyspnea, unspecified: Secondary | ICD-10-CM | POA: Diagnosis not present

## 2018-02-05 DIAGNOSIS — J330 Polyp of nasal cavity: Secondary | ICD-10-CM | POA: Diagnosis not present

## 2018-02-05 DIAGNOSIS — Z87891 Personal history of nicotine dependence: Secondary | ICD-10-CM | POA: Diagnosis not present

## 2018-02-05 DIAGNOSIS — Z7982 Long term (current) use of aspirin: Secondary | ICD-10-CM | POA: Diagnosis not present

## 2018-02-05 DIAGNOSIS — J324 Chronic pansinusitis: Secondary | ICD-10-CM | POA: Diagnosis not present

## 2018-02-05 DIAGNOSIS — K219 Gastro-esophageal reflux disease without esophagitis: Secondary | ICD-10-CM | POA: Diagnosis not present

## 2018-02-05 DIAGNOSIS — J3089 Other allergic rhinitis: Secondary | ICD-10-CM | POA: Diagnosis not present

## 2018-02-07 DIAGNOSIS — R490 Dysphonia: Secondary | ICD-10-CM | POA: Insufficient documentation

## 2018-02-07 DIAGNOSIS — J324 Chronic pansinusitis: Secondary | ICD-10-CM | POA: Insufficient documentation

## 2018-02-14 ENCOUNTER — Other Ambulatory Visit: Payer: Self-pay | Admitting: Cardiovascular Disease

## 2018-02-14 DIAGNOSIS — E785 Hyperlipidemia, unspecified: Secondary | ICD-10-CM

## 2018-02-14 DIAGNOSIS — R0609 Other forms of dyspnea: Secondary | ICD-10-CM

## 2018-02-14 DIAGNOSIS — R0600 Dyspnea, unspecified: Secondary | ICD-10-CM

## 2018-02-14 NOTE — Telephone Encounter (Signed)
Rx request sent to pharmacy.  

## 2018-03-01 DIAGNOSIS — H40013 Open angle with borderline findings, low risk, bilateral: Secondary | ICD-10-CM | POA: Diagnosis not present

## 2018-03-01 DIAGNOSIS — H10413 Chronic giant papillary conjunctivitis, bilateral: Secondary | ICD-10-CM | POA: Diagnosis not present

## 2018-03-01 DIAGNOSIS — E119 Type 2 diabetes mellitus without complications: Secondary | ICD-10-CM | POA: Diagnosis not present

## 2018-03-01 DIAGNOSIS — H26492 Other secondary cataract, left eye: Secondary | ICD-10-CM | POA: Diagnosis not present

## 2018-03-01 DIAGNOSIS — H25811 Combined forms of age-related cataract, right eye: Secondary | ICD-10-CM | POA: Diagnosis not present

## 2018-03-01 DIAGNOSIS — Z961 Presence of intraocular lens: Secondary | ICD-10-CM | POA: Diagnosis not present

## 2018-03-04 ENCOUNTER — Telehealth: Payer: Self-pay | Admitting: *Deleted

## 2018-03-04 NOTE — Telephone Encounter (Signed)
Left message for patient to call regarding pulmonary consult

## 2018-03-05 NOTE — Telephone Encounter (Signed)
Follow Up::     Returning your call from yesterday. Is she is not there, please leave a detailed message.

## 2018-03-11 DIAGNOSIS — R143 Flatulence: Secondary | ICD-10-CM | POA: Diagnosis not present

## 2018-03-11 DIAGNOSIS — N9089 Other specified noninflammatory disorders of vulva and perineum: Secondary | ICD-10-CM | POA: Diagnosis not present

## 2018-03-11 DIAGNOSIS — R1031 Right lower quadrant pain: Secondary | ICD-10-CM | POA: Diagnosis not present

## 2018-03-12 ENCOUNTER — Ambulatory Visit: Payer: Medicare HMO | Admitting: Cardiovascular Disease

## 2018-03-12 ENCOUNTER — Encounter: Payer: Self-pay | Admitting: Cardiovascular Disease

## 2018-03-12 DIAGNOSIS — R0609 Other forms of dyspnea: Secondary | ICD-10-CM

## 2018-03-12 DIAGNOSIS — E785 Hyperlipidemia, unspecified: Secondary | ICD-10-CM

## 2018-03-12 DIAGNOSIS — R0600 Dyspnea, unspecified: Secondary | ICD-10-CM

## 2018-03-12 MED ORDER — CARVEDILOL 12.5 MG PO TABS: 18.7500 mg | ORAL_TABLET | Freq: Two times a day (BID) | ORAL | 3 refills | Status: DC

## 2018-03-12 MED ORDER — HYDRALAZINE HCL 25 MG PO TABS: 25.0000 mg | ORAL_TABLET | Freq: Three times a day (TID) | ORAL | 3 refills | Status: DC

## 2018-03-12 MED ORDER — ASPIRIN 81 MG PO TABS: 81.0000 mg | ORAL_TABLET | Freq: Every day | ORAL | Status: DC

## 2018-03-12 MED ORDER — FUROSEMIDE 40 MG PO TABS: 60.0000 mg | ORAL_TABLET | Freq: Every day | ORAL | 3 refills | Status: DC

## 2018-03-12 MED ORDER — ISOSORBIDE MONONITRATE ER 30 MG PO TB24: 30.0000 mg | ORAL_TABLET | Freq: Every day | ORAL | 3 refills | Status: DC

## 2018-03-12 NOTE — Progress Notes (Signed)
03/12/2018 Megan Hull   [birth year]  [id number]  Primary Physician Via, Lennette Bihari, MD Primary Cardiologist: Lorretta Harp MD FACP, [place], [place], [place]  HPI:  Megan Hull is a 77 y.o.  mildly overweight, single [name] with no children whom I last saw  05/31/2017... Her problems include hypertension, hyperlipidemia, continued tobacco abuse of one-third pack per day, and family history of premature heart disease. She had a negative stress test on December 24, 2007. She was admitted to the [facility] on January 15, 2011, with jaw pain, thought to be an anginal equivalent. I catheterized her, revealing noncritical CAD with a 60% mid LAD lesion. She had anterior takeoff RCA. She has been asymptomatic since. She had carotid Dopplers performed at Dr. Roland Earl office which apparently were unremarkable. A lipid profile performed in March revealed total cholesterol of 142, LDL of 76, and HDL of 47. Her major complaint is of lower extremity edema, which gets worse during the day. I had done venous reflux studies on her back in November which were normal.since I saw her last a year and a half ago she has noticed increasing lower extremity edema which is dependent as well as increasing dyspnea on exertion. She denies chest pain. Her weight is stable same. She does admit to dietary indiscretion with regard to salt. She empirically increased her diuretic therapy. This resulted in improvement in her edema. She had recently come back from spending 5 weeks in [place] when I saw her last in July of last year and was anticipating going to [place]. I did do a Myoview stress test on her because of chest pain 01/12/16 which was normal. She developed an angioedema in August of last year probably related to her ARB. She didn't require intubation for airway protection. She had no allergy testing and ARB allergy was confirmed. She was begun on amlodipine which resulted in increasing lower extremity  edema, fluid retention and dyspnea. Her amlodipine was discontinued and she was begun on hydralazine. She returns today with complaints of increasing dyspnea on exertion and lower extremity edema. Her PCP recently increased her diuretic which resulted in some improvement in her edema. Since I saw her back 6 months ago she continues to complain of dyspnea on exertion which is progressive.   Since I saw her in the office last December her weight has remained stable.  Her dyspnea has remained stable as well.  She denies chest pain.  She is anticipating a trip to [place] in the upcoming future.  Current Meds  Medication Sig  . Alum & Mag Hydroxide-Simeth (ANTACID ADVANCED PO) Take by mouth daily.  [name] aspirin 81 MG tablet Take 81 mg by mouth daily.   [name] azithromycin (ZITHROMAX) 250 MG tablet Take two tabs today and then one tab daily until finished.  . carvedilol (COREG) 12.5 MG tablet Take 1.5 tablets (18.75 mg total) by mouth 2 (two) times daily with a meal.  . cetirizine (ZYRTEC) 10 MG tablet Take 10 mg by mouth daily.   . Cholecalciferol (VITAMIN D3) 5000 units CAPS Take 1 capsule by mouth daily.  . diphenhydrAMINE (BENADRYL ALLERGY) 25 MG tablet Take 2 tablets by mouth as needed for anaphylaxis.  [name] EPINEPHrine 0.3 mg/0.3 mL IJ SOAJ injection Inject 0.3 mLs (0.3 mg total) into the muscle once as needed (anaphylaxis).  . furosemide (LASIX) 40 MG tablet TAKE 1 TABLET TWICE DAILY (Patient taking differently: Take 60 mg by mouth daily. Take 1.5 tablet daily. Take 40  mg in the mornings and 20 mg in the evenings)  . hydrALAZINE (APRESOLINE) 25 MG tablet TAKE 1 TABLET THREE TIMES DAILY  . isosorbide mononitrate (IMDUR) 30 MG 24 hr tablet TAKE 1 TABLET (30 MG TOTAL) BY MOUTH DAILY.  . montelukast (SINGULAIR) 10 MG tablet Take 10 mg by mouth at bedtime.  . Probiotic Product (PROBIOTIC DAILY PO) Take by mouth.  . simvastatin (ZOCOR) 40 MG tablet TAKE 1 TABLET DAILY.     Allergies  Allergen Reactions  .  Nexium [Esomeprazole Magnesium] Anaphylaxis    Pt states she was unable to swallow for several hours after taking the nexium  . Other Hives, Shortness Of Breath and Other (See Comments)    Red Fish (Hives) Bojangle's chicken (swelling and shortness of breath) PAPRIKA  . Codeine Other (See Comments)    REACTION: chest pain  . Ace Inhibitors Swelling    Angioedema   . Latex Other (See Comments)    Causes skin to turn red per patient   . Monosodium Glutamate Other (See Comments)    dizziness  . Amlodipine Other (See Comments)    Lower extremity swelling, itching  . Shellfish Allergy Hives  . Tape Rash    Coban wrap turned her arm red (after stress test)    Social History   Socioeconomic History  . Marital status: Single    Spouse name: Not on file  . Number of children: 0  . Years of education: 12+  . Highest education level: Not on file  Occupational History  . 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 Financial resource strain: Not on file  . Food insecurity:    Worry: Not on file    Inability: Not on file  . Transportation needs:    Medical: Not on file    Non-medical: Not on file  Tobacco Use  . Smoking status: Former Smoker    Packs/day: 0.25    Years: 15.00    Pack years: 3.75    Types: Cigarettes    Last attempt to quit: 02/04/2016    Years since quitting: 2.1  . Smokeless tobacco: Never Used  Substance and Sexual Activity  . Alcohol use: Yes    Alcohol/week: 21.0 standard drinks    Types: 7 Shots of liquor, 14 Standard drinks or equivalent per week    Comment: every night scotch  . Drug use: No  . Sexual activity: Not on file  Lifestyle  . Physical activity:    Days per week: Not on file    Minutes per session: Not on file  . Stress: Not on file  Relationships  . Social connections:    Talks on phone: Not on file    Gets together: Not on file    Attends religious service: Not on file    Active member of club or organization: Not on file    Attends  meetings of clubs or organizations: Not on file    Relationship status: Not on file  . Intimate partner violence:    Fear of current or ex partner: Not on file    Emotionally abused: Not on file    Physically abused: Not on file    Forced sexual activity: Not on file  Other Topics Concern  . Not on file  Social History Narrative   Lives with sister   Caffeine use: 2 cups coffee per day (mixed decaf/regular)     Review of Systems: General: negative for chills, fever, night sweats or weight changes.  Cardiovascular: negative for chest pain, dyspnea on exertion, edema, orthopnea, palpitations, paroxysmal nocturnal dyspnea or shortness of breath Dermatological: negative for rash Respiratory: negative for cough or wheezing Urologic: negative for hematuria Abdominal: negative for nausea, vomiting, diarrhea, bright red blood per rectum, melena, or hematemesis Neurologic: negative for visual changes, syncope, or dizziness All other systems reviewed and are otherwise negative except as noted above.    Blood pressure 120/78, pulse 66, height 5\' 5"  (1.651 m), weight 175 lb (79.4 kg).  General appearance: alert and no distress Neck: no adenopathy, no carotid bruit, no JVD, supple, symmetrical, trachea midline and thyroid not enlarged, symmetric, no tenderness/mass/nodules Lungs: clear to auscultation bilaterally Heart: regular rate and rhythm, S1, S2 normal, no murmur, click, rub or gallop Extremities: 1+ pitting edema bilaterally Pulses: 2+ and symmetric Skin: Skin color, texture, turgor normal. No rashes or lesions Neurologic: Alert and oriented X 3, normal strength and tone. Normal symmetric reflexes. Normal coordination and gait  EKG sinus rhythm at 66 with first-degree AV block.  I personally reviewed this EKG.  ASSESSMENT AND PLAN:   Essential hypertension She of essential hypertension blood pressure measured today 120/78.  She is on carvedilol, hydralazine.  Continue current meds  at current dosing.  Dyslipidemia History of dyslipidemia on statin therapy lipid profile performed 07/03/2017 revealing total cholesterol 133, LDL 66 and HDL 50.  Dyspnea History of chronic dyspnea on exertion probably related to diastolic heart failure on diuretics.  Coronary artery disease History of mild/noncritical CAD by cardiac catheterization August 2012 performed by myself with 60% mid LAD.  She denies chest pain.  Chronic diastolic (congestive) heart failure (HCC) She had diastolic heart failure 2D echo performed 07/27/2016 on diuretics she does watch her salt intake and wears compression stockings.      Lorretta Harp MD FACP,FACC,FAHA, [facility] 03/12/2018 11:11 AM

## 2018-03-12 NOTE — Assessment & Plan Note (Signed)
She had diastolic heart failure 2D echo performed 07/27/2016 on diuretics she does watch her salt intake and wears compression stockings.

## 2018-03-12 NOTE — Patient Instructions (Signed)

## 2018-03-12 NOTE — Assessment & Plan Note (Signed)
History of dyslipidemia on statin therapy lipid profile performed 07/03/2017 revealing total cholesterol 133, LDL 66 and HDL 50.

## 2018-03-12 NOTE — Assessment & Plan Note (Signed)
History of chronic dyspnea on exertion probably related to diastolic heart failure on diuretics.

## 2018-03-12 NOTE — Assessment & Plan Note (Signed)
She of essential hypertension blood pressure measured today 120/78.  She is on carvedilol, hydralazine.  Continue current meds at current dosing.

## 2018-03-12 NOTE — Assessment & Plan Note (Signed)
History of mild/noncritical CAD by cardiac catheterization August 2012 performed by myself with 60% mid LAD.  She denies chest pain.

## 2018-03-14 DIAGNOSIS — R1031 Right lower quadrant pain: Secondary | ICD-10-CM | POA: Diagnosis not present

## 2018-04-01 ENCOUNTER — Other Ambulatory Visit: Payer: Self-pay | Admitting: Family Medicine

## 2018-04-01 DIAGNOSIS — Z1231 Encounter for screening mammogram for malignant neoplasm of breast: Secondary | ICD-10-CM

## 2018-04-21 DIAGNOSIS — H2511 Age-related nuclear cataract, right eye: Secondary | ICD-10-CM | POA: Diagnosis not present

## 2018-04-25 DIAGNOSIS — H2511 Age-related nuclear cataract, right eye: Secondary | ICD-10-CM | POA: Diagnosis not present

## 2018-04-25 DIAGNOSIS — H268 Other specified cataract: Secondary | ICD-10-CM | POA: Diagnosis not present

## 2018-05-01 DIAGNOSIS — J0100 Acute maxillary sinusitis, unspecified: Secondary | ICD-10-CM | POA: Diagnosis not present

## 2018-05-13 ENCOUNTER — Ambulatory Visit: Payer: Medicare HMO

## 2018-05-13 ENCOUNTER — Ambulatory Visit
Admission: RE | Admit: 2018-05-13 | Discharge: 2018-05-13 | Disposition: A | Discharge status: Home / Self Care | Payer: Medicare HMO | Source: Ambulatory Visit | Attending: Family Medicine | Admitting: Family Medicine

## 2018-05-13 DIAGNOSIS — Z1231 Encounter for screening mammogram for malignant neoplasm of breast: Secondary | ICD-10-CM

## 2018-05-21 ENCOUNTER — Emergency Department (HOSPITAL_COMMUNITY): Payer: Medicare HMO

## 2018-05-21 ENCOUNTER — Emergency Department (HOSPITAL_COMMUNITY)
Admission: EM | Admit: 2018-05-21 | Discharge: 2018-05-21 | Disposition: A | Discharge status: Home / Self Care | Payer: Medicare HMO | Attending: Internal Medicine | Admitting: Internal Medicine

## 2018-05-21 ENCOUNTER — Encounter (HOSPITAL_COMMUNITY): Payer: Self-pay | Admitting: Emergency Medicine

## 2018-05-21 DIAGNOSIS — I2510 Atherosclerotic heart disease of native coronary artery without angina pectoris: Secondary | ICD-10-CM | POA: Diagnosis not present

## 2018-05-21 DIAGNOSIS — I5032 Chronic diastolic (congestive) heart failure: Secondary | ICD-10-CM | POA: Diagnosis not present

## 2018-05-21 DIAGNOSIS — I471 Supraventricular tachycardia, unspecified: Secondary | ICD-10-CM | POA: Diagnosis present

## 2018-05-21 DIAGNOSIS — Z7982 Long term (current) use of aspirin: Secondary | ICD-10-CM | POA: Diagnosis not present

## 2018-05-21 DIAGNOSIS — Z79899 Other long term (current) drug therapy: Secondary | ICD-10-CM | POA: Diagnosis not present

## 2018-05-21 DIAGNOSIS — R072 Precordial pain: Secondary | ICD-10-CM

## 2018-05-21 DIAGNOSIS — I110 Hypertensive heart disease with heart failure: Secondary | ICD-10-CM | POA: Insufficient documentation

## 2018-05-21 DIAGNOSIS — E119 Type 2 diabetes mellitus without complications: Secondary | ICD-10-CM | POA: Insufficient documentation

## 2018-05-21 DIAGNOSIS — R0602 Shortness of breath: Secondary | ICD-10-CM | POA: Diagnosis not present

## 2018-05-21 DIAGNOSIS — R079 Chest pain, unspecified: Secondary | ICD-10-CM | POA: Diagnosis not present

## 2018-05-21 DIAGNOSIS — Z91040 Latex allergy status: Secondary | ICD-10-CM | POA: Insufficient documentation

## 2018-05-21 DIAGNOSIS — I10 Essential (primary) hypertension: Secondary | ICD-10-CM | POA: Diagnosis not present

## 2018-05-21 DIAGNOSIS — Z87891 Personal history of nicotine dependence: Secondary | ICD-10-CM | POA: Insufficient documentation

## 2018-05-21 LAB — BASIC METABOLIC PANEL
Anion gap: 11 (ref 5–15)
BUN: 14 mg/dL (ref 8–23)
CO2: 27 mmol/L (ref 22–32)
Calcium: 8.8 mg/dL — ABNORMAL LOW (ref 8.9–10.3)
Chloride: 100 mmol/L (ref 98–111)
Creatinine, Ser: 1.18 mg/dL — ABNORMAL HIGH (ref 0.44–1.00)
GFR calc Af Amer: 51 mL/min — ABNORMAL LOW (ref >60)
GFR calc non Af Amer: 44 mL/min — ABNORMAL LOW (ref >60)
Glucose, Bld: 121 mg/dL — ABNORMAL HIGH (ref 70–99)
Potassium: 3.6 mmol/L (ref 3.5–5.1)
Sodium: 138 mmol/L (ref 135–145)

## 2018-05-21 LAB — CBC
HCT: 40.7 % (ref 36.0–46.0)
Hemoglobin: 13 g/dL (ref 12.0–15.0)
MCH: 29.1 pg (ref 26.0–34.0)
MCHC: 31.9 g/dL (ref 30.0–36.0)
MCV: 91.3 fL (ref 80.0–100.0)
Platelets: 222 10*3/uL (ref 150–400)
RBC: 4.46 MIL/uL (ref 3.87–5.11)
RDW: 13.2 % (ref 11.5–15.5)
WBC: 7.2 10*3/uL (ref 4.0–10.5)
nRBC: 0 % (ref 0.0–0.2)

## 2018-05-21 LAB — I-STAT TROPONIN, ED: TROPONIN I, POC: 0 ng/mL (ref 0.00–0.08)

## 2018-05-21 LAB — TROPONIN I: Troponin I: <0.03 ng/mL (ref <0.03)

## 2018-05-21 MED ORDER — HYDRALAZINE HCL 25 MG PO TABS
25.0000 mg | ORAL_TABLET | Freq: Once | ORAL | Status: AC
  Administered 2018-05-21: 25 mg via ORAL
  Filled 2018-05-21: qty 1

## 2018-05-21 MED ORDER — CARVEDILOL 12.5 MG PO TABS
12.5000 mg | ORAL_TABLET | Freq: Two times a day (BID) | ORAL | Status: DC
  Administered 2018-05-21: 12.5 mg via ORAL
  Filled 2018-05-21: qty 1

## 2018-05-21 NOTE — ED Provider Notes (Signed)
Medical screening examination/treatment/procedure(s) were conducted as a shared visit with non-physician practitioner(s) and myself.  I personally evaluated the patient during the encounter.  EKG Interpretation  Date/Time:  Tuesday May 21 2018 02:20:11 EST Ventricular Rate:  83 PR Interval:  204 QRS Duration: 80 QT Interval:  368 QTC Calculation: 432 R Axis:   12 Text Interpretation:  Sinus rhythm with frequent Premature ventricular complexes Otherwise normal ECG No significant change since last tracing Confirmed by Pryor Curia [phone]) on 05/21/2018 5:24:12 AM   Patient is a 77 year old female who presents to the emergency department with several episodes of squeezing chest heaviness.  Reports feeling short of breath.  Has a heart score of 5.  Chest pain-free currently.  EKG shows no ischemic changes.  Troponin negative.  Chest x-ray clear.  Will admit for chest pain rule out.  Symptoms up and after episode of SVT.  Reports that she does not normally have symptoms like this after her episodes of SVT.   Ward, Delice Bison, DO 05/21/18 [phone]

## 2018-05-21 NOTE — ED Provider Notes (Signed)
[facility] EMERGENCY DEPARTMENT Provider Note   CSN: [id number] Arrival date & time: 05/21/18  0108   History   Chief Complaint Chief Complaint  Patient presents with  . Chest Pain    HPI Megan Hull is a 77 y.o. female with a PMH of Diabetes and SVT presents with intermittent chest tightness that radiated to her left axilla onset 11:30pm while watching TV. Patient states pain began after having an episode of SVT that resolved with valsalva maneuvers. Patient states she has SVT episodes occasionally and they typically resolve with valsalva maneuvers. Patient states she had 2 episodes of chest tightness and the first episode lasted 2 hours. Patient states second episode lasted until she arrived to the ER. Patient reports associated shortness of breath and palpitations. Patient states all her symptoms have resolved. Patient denies taking any medications for the pain and states nothing makes the symptoms better or worse. Patient reports chronic bilateral leg edema. Patient denies weight changes, abdominal pain, nausea, vomiting, or sweats.    HPI  Past Medical History:  Diagnosis Date  . Arthritis   . Diabetes mellitus without complication (HCC)    borderline  . Diarrhea   . Diverticulosis   . Dysrhythmia    controlled with Metoprolol-12-19-13 LOV -Dr. Gwenlyn Found sees yearly  . Family history of premature CAD   . GERD (gastroesophageal reflux disease)   . Heart murmur    yrs ago  . HLD (hyperlipidemia)   . Hypertension   . Lower extremity edema   . Status post dilation of esophageal narrowing   . Supraventricular arrhythmia   . Tobacco abuse     Patient Active Problem List   Diagnosis Date Noted  . Acute sinusitis 01/24/2018  . Chronic diastolic (congestive) heart failure ([place]) 05/31/2017  . Left leg weakness 02/10/2016  . Left foot drop 02/10/2016  . DJD (degenerative joint disease) 01/26/2016  . History of angioedema 01/21/2016  . Cervical  spondylosis with myelopathy and radiculopathy 06/08/2015  . Coronary artery disease 12/18/2014  . Esophageal stricture 04/23/2014  . Lower extremity numbness 08/11/2013  . Essential hypertension 05/15/2013  . Dyslipidemia 05/15/2013  . Dyspnea 05/15/2013  . Lower extremity edema 05/15/2013  . Diarrhea 02/12/2013  . Esophageal reflux 01/20/2010  . Dysphagia 01/20/2010    Past Surgical History:  Procedure Laterality Date  . ANTERIOR CERVICAL DECOMP/DISCECTOMY FUSION N/A 06/08/2015   Procedure: CERVICAL FIVE-SIX ,CERVICAL SIX-SEVEN ANTERIOR CERVICAL DECOMPRESSION/DISCECTOMY FUSION PLATING BONEGRAFT;  Surgeon: Kristeen Miss, MD;  Location: [facility];  Service: Neurosurgery;  Laterality: N/A;  . 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 Adora Fridge)  . COLONOSCOPY    . ESOPHAGEAL DILATION  2014  . infected lymph node surgery  80's  . NM MYOCAR PERF WALL MOTION  04/2011   bruce myoview; normal pattern of perfusion, EF 80%, low risk scan  . TONSILLECTOMY  70's  . TRANSTHORACIC ECHOCARDIOGRAM  12/2007   mild conc LVH     OB History   None      Home Medications    Prior to Admission medications   Medication Sig Start Date End Date Taking? Authorizing Provider  Alum & Mag Hydroxide-Simeth (ANTACID ADVANCED PO) Take by mouth daily.    [provider]  aspirin 81 MG tablet Take 1 tablet (81 mg total) by mouth daily. 03/12/18   Lorretta Harp, MD  azithromycin (ZITHROMAX) 250 MG tablet Take two tabs today and then one tab daily  until finished. 01/24/18   Rigoberto Noel, MD  carvedilol (COREG) 12.5 MG tablet Take 1.5 tablets (18.75 mg total) by mouth 2 (two) times daily with a meal. 03/12/18   Lorretta Harp, MD  cetirizine (ZYRTEC) 10 MG tablet Take 10 mg by mouth daily.     [provider]  Cholecalciferol (VITAMIN D3) 5000 units CAPS Take 1 capsule by mouth daily.    [provider]  diphenhydrAMINE (BENADRYL ALLERGY)  25 MG tablet Take 2 tablets by mouth as needed for anaphylaxis.    [provider]  EPINEPHrine 0.3 mg/0.3 mL IJ SOAJ injection Inject 0.3 mLs (0.3 mg total) into the muscle once as needed (anaphylaxis). 12/11/15   Patrecia Pour, MD  furosemide (LASIX) 40 MG tablet Take 1.5 tablets (60 mg total) by mouth daily. Take 1.5 tablet daily. Take 40 mg in the mornings and 20 mg in the evenings 03/12/18   Lorretta Harp, MD  hydrALAZINE (APRESOLINE) 25 MG tablet Take 1 tablet (25 mg total) by mouth 3 (three) times daily. 03/12/18   Lorretta Harp, MD  isosorbide mononitrate (IMDUR) 30 MG 24 hr tablet Take 1 tablet (30 mg total) by mouth daily. 03/12/18   Lorretta Harp, MD  montelukast (SINGULAIR) 10 MG tablet Take 10 mg by mouth at bedtime.    [provider]  Probiotic Product (PROBIOTIC DAILY PO) Take by mouth.    [provider]  simvastatin (ZOCOR) 40 MG tablet TAKE 1 TABLET DAILY. 12/11/17   Lorretta Harp, MD    Family History Family History  Problem Relation Age of Onset  . Heart disease Brother        MI @ 83, HTN @ 97, DM @ 21   . Heart disease Father        MI  . Heart disease Mother        MI, CVA @ 9  . Heart disease Sister        CHF, CVA  . Breast cancer Sister 72  . Esophageal cancer Brother   . Alzheimer's disease Sister   . Heart disease Brother        MI in 46s, lung cancer  . Heart disease Sister   . Hypertension Sister        x2  . Hyperlipidemia Sister        x2  . Diabetes Sister        x2  . Cancer Sister   . Hypertension Brother   . Colon cancer Neg Hx     Social History Social History   Tobacco Use  . Smoking status: Former Smoker    Packs/day: 0.25    Years: 15.00    Pack years: 3.75    Types: Cigarettes    Last attempt to quit: 02/04/2016    Years since quitting: 2.2  . Smokeless tobacco: Never Used  Substance Use Topics  . Alcohol use: Yes    Alcohol/week: 21.0 standard drinks    Types: 7 Shots of liquor, 14  Standard drinks or equivalent per week    Comment: every night scotch  . Drug use: No     Allergies   Nexium [esomeprazole magnesium]; Other; Codeine; Ace inhibitors; Latex; Monosodium glutamate; Amlodipine; Shellfish allergy; and Tape   Review of Systems Review of Systems  Constitutional: Negative for activity change, appetite change, chills, diaphoresis, fatigue, fever and unexpected weight change.  HENT: Negative for congestion and rhinorrhea.   Respiratory: Positive for shortness of  breath. Negative for cough, chest tightness and wheezing.   Cardiovascular: Positive for chest pain (Pt reports chest fullness.), palpitations and leg swelling.  Gastrointestinal: Negative for abdominal pain, nausea and vomiting.  Endocrine: Negative for cold intolerance and heat intolerance.  Musculoskeletal: Negative for back pain.  Skin: Negative for rash.  Allergic/Immunologic: Negative for immunocompromised state.  Neurological: Negative for dizziness, syncope, weakness and light-headedness.  Psychiatric/Behavioral: Negative for agitation and behavioral problems. The patient is not nervous/anxious.     Physical Exam Updated Vital Signs BP (!) 176/83   Pulse 78   Temp 98.3 F (36.8 C) (Oral)   Resp 12   Ht 5\' 5"  (1.651 m)   Wt 79.4 kg   SpO2 98%   BMI 29.12 kg/m   Physical Exam  Constitutional: She appears well-developed and well-nourished. No distress.  HENT:  Head: Normocephalic and atraumatic.  Neck: Normal range of motion. Neck supple. No JVD present.  Cardiovascular: Normal rate, regular rhythm, normal heart sounds and normal pulses. Exam reveals no gallop and no friction rub.  No murmur heard. Pulses:      Radial pulses are 2+ on the right side, and 2+ on the left side.       Dorsalis pedis pulses are 2+ on the right side, and 2+ on the left side.  Pulmonary/Chest: Effort normal and breath sounds normal. No respiratory distress. She has no wheezes. She has no rales. She  exhibits no tenderness.  Abdominal: Soft. There is no tenderness.  Musculoskeletal: Normal range of motion.       Right lower leg: She exhibits edema. She exhibits no tenderness.       Left lower leg: She exhibits edema. She exhibits no tenderness.  Neurological: She is alert.  Skin: Capillary refill takes less than 2 seconds. No rash noted. She is not diaphoretic. No pallor.  Psychiatric: She has a normal mood and affect.  Nursing note and vitals reviewed.    ED Treatments / Results  Labs (all labs ordered are listed, but only abnormal results are displayed) Labs Reviewed  BASIC METABOLIC PANEL - Abnormal; Notable for the following components:      Result Value   Glucose, Bld 121 (*)    Creatinine, Ser 1.18 (*)    Calcium 8.8 (*)    GFR calc non Af Amer 44 (*)    GFR calc Af Amer 51 (*)    All other components within normal limits  CBC  I-STAT TROPONIN, ED    EKG EKG Interpretation  Date/Time:  Tuesday May 21 2018 02:20:11 EST Ventricular Rate:  83 PR Interval:  204 QRS Duration: 80 QT Interval:  368 QTC Calculation: 432 R Axis:   12 Text Interpretation:  Sinus rhythm with frequent Premature ventricular complexes Otherwise normal ECG No significant change since last tracing Confirmed by Pryor Curia [id number]) on 05/21/2018 5:24:12 AM   Radiology Dg Chest 2 View  Result Date: 05/21/2018 CLINICAL DATA:  Shortness of breath. Sensation of fullness in the chest. EXAM: CHEST - 2 VIEW COMPARISON:  06/26/2017. FINDINGS: Normal sized heart. Tortuous and partially calcified thoracic aorta. Clear lungs. Stable mildly prominent interstitial markings with normal vascularity. No pleural fluid. Possible small amount of air in the distal esophagus on the lateral view. Cervical spine fixation hardware. Mild dextroconvex thoracic scoliosis. IMPRESSION: No acute abnormality. Stable mild chronic interstitial lung disease. Electronically Signed   By: Claudie Revering M.D.   On: 05/21/2018  01:46    Procedures Procedures (including critical care time)  Medications Ordered in ED Medications  carvedilol (COREG) tablet 12.5 mg (has no administration in time range)  hydrALAZINE (APRESOLINE) tablet 25 mg (25 mg Oral Given 05/21/18 0540)     Initial Impression / Assessment and Plan / ED Course  I have reviewed the triage vital signs and the nursing notes.  Pertinent labs & imaging results that were available during my care of the patient were reviewed by me and considered in my medical decision making (see chart for details).  Clinical Course as of May 22 547  Tue May 21, 2018  0410 CXR reveals no acute abnormality. Stable mild chronic interstitial lung disease noted.   DG Chest 2 View [AH]  9628 CBC is unremarkable.   CBC [AH]  0547 First troponin is negative.  I-stat troponin, ED [AH]    Clinical Course User Index [AH] Arville Lime, PA-C   Concern for cardiac etiology of Chest Pain due to new symptoms after SVT, risk factors, and heart pathway score of 5. Pt has been re-evaluated prior to consult and VSS, NAD, heart RRR, pain 0/10, lungs CTAB. No acute abnormalities found on EKG and first round of cardiac enzymes negative. This case was discussed with Dr. Leonides Schanz who has seen the patient and agrees with plan to admit. Lorre Munroe PA-C spoke to hospitalist and hospitalist has agreed to admit the patient for further evaluation.  Final Clinical Impressions(s) / ED Diagnoses   Final diagnoses:  Chest pain, unspecified type    ED Discharge Orders    None       Arville Lime, PA-C 05/21/18 [place], Delice Bison, DO 05/21/18 [phone]

## 2018-05-21 NOTE — ED Notes (Signed)
bfast tray ordered 

## 2018-05-21 NOTE — ED Triage Notes (Signed)
Pt presents with multiple complaints. Pt reports "fullness in her chest" and SOB present since this evening. Pt noted to be hypertensive in triage.

## 2018-05-21 NOTE — ED Notes (Signed)
Pt verbalized understanding of discharge paperwork and follow-up care.  °

## 2018-05-21 NOTE — Consult Note (Signed)
ER Consult Note    Megan Hull BLT:[id number] DOB: [birth year] DOA: 05/21/2018  PCP: Dineen Kid, MD; new PCP will be Dr. Theda Sers, first appointment is this Thursday Consultants:  Gwenlyn Found - cardiology; [facility] - pulmonology; [facility] - ENT; [facility] - allergy Patient coming from:  Home - lives with niece; NOK: Carlus Pavlov, nephew  Chief Complaint: chest pain  HPI: Megan Hull is a 77 y.o. female with medical history significant of HTN; HLD; SVT; and DM presenting with chest pain.  She was watching the football game, lying on the couch when her heart started racing.  She has h/o SVT.  It then felt like there was a belt around her chest.  When her BP goes up she gets a pain radiating into her left ear and that also happened.  When she gets SVT, she does Valsalva.  She did this and it slowed down and she went through trigeminy and then bigeminy (could feel this) and then it stopped.  This episode was exactly the same as her usual.  This is the third time it has happened.  She feels good now.  ED Course:  Carryover, per Dr. Alcario Drought: 77 yo F with h/o SVT. Says she felt like she went into SVT this evening. Did vagal manuvers, SVT symptoms resolved. But had chest pressure and fullness sensation after. So came to ED. CP resolved and patient asymptomatic. Trop neg.   Review of Systems: As per HPI; otherwise review of systems reviewed and negative.   Ambulatory Status:  Ambulates without assistance  Past Medical History:  Diagnosis Date  . Arthritis   . Diabetes mellitus without complication (HCC)    borderline  . Diarrhea   . Diverticulosis   . Dysrhythmia    controlled with Metoprolol-12-19-13 LOV -Dr. Gwenlyn Found sees yearly  . Family history of premature CAD   . GERD (gastroesophageal reflux disease)   . Heart murmur    yrs ago  . HLD (hyperlipidemia)   . Hypertension   . Lower extremity edema   . Status post dilation of esophageal narrowing   . Supraventricular arrhythmia    . Tobacco abuse     Past Surgical History:  Procedure Laterality Date  . ANTERIOR CERVICAL DECOMP/DISCECTOMY FUSION N/A 06/08/2015   Procedure: CERVICAL FIVE-SIX ,CERVICAL SIX-SEVEN ANTERIOR CERVICAL DECOMPRESSION/DISCECTOMY FUSION PLATING BONEGRAFT;  Surgeon: Kristeen Miss, MD;  Location: [place] [facility];  Service: Neurosurgery;  Laterality: N/A;  . 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 Adora Fridge)  . CATARACT EXTRACTION, BILATERAL     most recent 11/19  . COLONOSCOPY    . ESOPHAGEAL DILATION  2014  . infected lymph node surgery  80's  . NM MYOCAR PERF WALL MOTION  04/2011   bruce myoview; normal pattern of perfusion, EF 80%, low risk scan  . TONSILLECTOMY  70's  . TRANSTHORACIC ECHOCARDIOGRAM  12/2007   mild conc LVH    Social History   Socioeconomic History  . Marital status: Single    Spouse name: Not on file  . Number of children: 0  . Years of education: 12+  . Highest education level: Not on file  Occupational History  . 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 Financial resource strain: Not on file  . Food insecurity:    Worry: Not on file    Inability: Not on file  . Transportation needs:    Medical: Not on file    Non-medical: Not on file  Tobacco Use  .  Smoking status: Former Smoker    Packs/day: 0.25    Years: 15.00    Pack years: 3.75    Types: Cigarettes    Last attempt to quit: 02/04/2016    Years since quitting: 2.2  . Smokeless tobacco: Never Used  Substance and Sexual Activity  . Alcohol use: Yes    Comment: occasional - tequila shots on holidays  . Drug use: No  . Sexual activity: Not on file  Lifestyle  . Physical activity:    Days per week: Not on file    Minutes per session: Not on file  . Stress: Not on file  Relationships  . Social connections:    Talks on phone: Not on file    Gets together: Not on file    Attends religious service: Not on file    Active member of club or organization: Not on file     Attends meetings of clubs or organizations: Not on file    Relationship status: Not on file  . Intimate partner violence:    Fear of current or ex partner: Not on file    Emotionally abused: Not on file    Physically abused: Not on file    Forced sexual activity: Not on file  Other Topics Concern  . Not on file  Social History Narrative   Lives with sister   Caffeine use: 2 cups coffee per day (mixed decaf/regular)    Allergies  Allergen Reactions  . Nexium [Esomeprazole Magnesium] Anaphylaxis    Pt states she was unable to swallow for several hours after taking the nexium  . Other Hives, Shortness Of Breath and Other (See Comments)    Red Fish (Hives) Bojangle's chicken (swelling and shortness of breath) PAPRIKA  . Codeine Other (See Comments)    REACTION: chest pain  . Ace Inhibitors Swelling    Angioedema   . Latex Other (See Comments)    Causes skin to turn red per patient   . Monosodium Glutamate Other (See Comments)    dizziness  . Amlodipine Other (See Comments)    Lower extremity swelling, itching  . Shellfish Allergy Hives  . Tape Rash    Coban wrap turned her arm red (after stress test)    Family History  Problem Relation Age of Onset  . Heart disease Brother        MI @ 91, HTN @ 51, DM @ 86   . Heart disease Father        MI  . Heart disease Mother        MI, CVA @ 54  . Heart disease Sister        CHF, CVA  . Breast cancer Sister 62  . Esophageal cancer Brother   . Alzheimer's disease Sister   . Heart disease Brother        MI in 51s, lung cancer  . Heart disease Sister   . Hypertension Sister        x2  . Hyperlipidemia Sister        x2  . Diabetes Sister        x2  . Cancer Sister   . Hypertension Brother   . Colon cancer Neg Hx     Prior to Admission medications   Medication Sig Start Date End Date Taking? Authorizing Provider  Alum & Mag Hydroxide-Simeth (ANTACID ADVANCED PO) Take 1 tablet by mouth daily as needed (indigestion).     Yes [provider]  aspirin 81 MG  tablet Take 1 tablet (81 mg total) by mouth daily. 03/12/18  Yes Lorretta Harp, MD  carvedilol (COREG) 12.5 MG tablet Take 1.5 tablets (18.75 mg total) by mouth 2 (two) times daily with a meal. 03/12/18  Yes Lorretta Harp, MD  cetirizine (ZYRTEC) 10 MG tablet Take 10 mg by mouth daily.    Yes [provider]  Cholecalciferol (VITAMIN D3) 5000 units CAPS Take 1 capsule by mouth daily.   Yes [provider]  diphenhydrAMINE (BENADRYL ALLERGY) 25 MG tablet Take 2 tablets by mouth as needed for anaphylaxis.   Yes [provider]  EPINEPHrine 0.3 mg/0.3 mL IJ SOAJ injection Inject 0.3 mLs (0.3 mg total) into the muscle once as needed (anaphylaxis). 12/11/15  Yes Patrecia Pour, MD  furosemide (LASIX) 40 MG tablet Take 1.5 tablets (60 mg total) by mouth daily. Take 1.5 tablet daily. Take 40 mg in the mornings and 20 mg in the evenings 03/12/18  Yes Lorretta Harp, MD  hydrALAZINE (APRESOLINE) 25 MG tablet Take 1 tablet (25 mg total) by mouth 3 (three) times daily. 03/12/18  Yes Lorretta Harp, MD  isosorbide mononitrate (IMDUR) 30 MG 24 hr tablet Take 1 tablet (30 mg total) by mouth daily. 03/12/18  Yes Lorretta Harp, MD  montelukast (SINGULAIR) 10 MG tablet Take 10 mg by mouth at bedtime.   Yes [provider]  Probiotic Product (PROBIOTIC DAILY PO) Take by mouth.   Yes [provider]  simvastatin (ZOCOR) 40 MG tablet TAKE 1 TABLET DAILY. Patient taking differently: Take 40 mg by mouth daily.  12/11/17  Yes Lorretta Harp, MD  azithromycin (ZITHROMAX) 250 MG tablet Take two tabs today and then one tab daily until finished. Patient not taking: Reported on 05/21/2018 01/24/18   Rigoberto Noel, MD    Physical Exam: Vitals:   05/21/18 0515 05/21/18 0530 05/21/18 0736 05/21/18 0937  BP: (!) 174/94 (!) 176/83 136/77 121/61  Pulse: 77 78 83 83  Resp: 16 12 18 20   Temp:      TempSrc:      SpO2: 96% 98%  99% 99%  Weight:      Height:         General:  Appears calm and comfortable and is NAD Eyes:   EOMI, normal lids, iris ENT:  grossly normal hearing, lips & tongue, mmm Neck:  no LAD, masses or thyromegaly; no carotid bruits Cardiovascular:  RRR, no m/r/g. No LE edema.  Respiratory:   CTA bilaterally with no wheezes/rales/rhonchi.  Normal respiratory effort. Abdomen:  soft, NT, ND, NABS Back:   normal alignment, no CVAT Skin:  no rash or induration seen on limited exam Musculoskeletal:  grossly normal tone BUE/BLE, good ROM, no bony abnormality Psychiatric:  grossly normal mood and affect, speech fluent and appropriate, AOx3 Neurologic:  CN 2-12 grossly intact, moves all extremities in coordinated fashion, sensation intact    Radiological Exams on Admission: Dg Chest 2 View  Result Date: 05/21/2018 CLINICAL DATA:  Shortness of breath. Sensation of fullness in the chest. EXAM: CHEST - 2 VIEW COMPARISON:  06/26/2017. FINDINGS: Normal sized heart. Tortuous and partially calcified thoracic aorta. Clear lungs. Stable mildly prominent interstitial markings with normal vascularity. No pleural fluid. Possible small amount of air in the distal esophagus on the lateral view. Cervical spine fixation hardware. Mild dextroconvex thoracic scoliosis. IMPRESSION: No acute abnormality. Stable mild chronic interstitial lung disease. Electronically Signed   By: Claudie Revering M.D.   On:  05/21/2018 01:46    EKG: Independently reviewed.  NSR with rate 83, frequent PVCs; nonspecific ST changes with no evidence of acute ischemia; NSCSLT   Labs on Admission: I have personally reviewed the available labs and imaging studies at the time of the admission.  Pertinent labs:   Glucose 121 BUN 14/Creatinine 1.18/GFR 44 Troponin 0.00 Normal CBC  Assessment/Plan Principal Problem:   Chest pain Active Problems:   Essential hypertension   SVT (supraventricular tachycardia) (HCC)   -Patient with prior h/o  SVT who had an episode of SVT overnight -She had associated chest pain that resolved with her symptoms -Her troponins are negative x 2 -EKG without ischemic changes -Based on suspected cause of CP being associated with SVT with resolution and negative troponin x 2, patient requests d/c with outpatient cardiology f/u -F/u cards appt 12/19 at 3:30 with Kerin Ransom, PA - patient informed and pleased. -She was discharged from the ER without further complication. -Medication review was done with the patient prior to d/c. -She has a PCP appointment this Thursday, as well.   Karmen Bongo MD [facility]  If note is complete, please contact covering daytime or nighttime physician. [email or web address] Password [facility]  05/21/2018, 6:34 PM

## 2018-05-23 DIAGNOSIS — R609 Edema, unspecified: Secondary | ICD-10-CM | POA: Diagnosis not present

## 2018-05-23 DIAGNOSIS — I471 Supraventricular tachycardia: Secondary | ICD-10-CM | POA: Diagnosis not present

## 2018-05-23 DIAGNOSIS — E7800 Pure hypercholesterolemia, unspecified: Secondary | ICD-10-CM | POA: Diagnosis not present

## 2018-05-23 DIAGNOSIS — I10 Essential (primary) hypertension: Secondary | ICD-10-CM | POA: Diagnosis not present

## 2018-05-30 ENCOUNTER — Encounter: Payer: Self-pay | Admitting: Cardiology

## 2018-05-30 ENCOUNTER — Ambulatory Visit: Payer: Medicare HMO | Admitting: Cardiology

## 2018-05-30 VITALS — BP 128/68 | HR 71 | Ht 65.0 in | Wt 173.5 lb

## 2018-05-30 DIAGNOSIS — I471 Supraventricular tachycardia: Secondary | ICD-10-CM | POA: Diagnosis not present

## 2018-05-30 DIAGNOSIS — R0602 Shortness of breath: Secondary | ICD-10-CM

## 2018-05-30 DIAGNOSIS — I2510 Atherosclerotic heart disease of native coronary artery without angina pectoris: Secondary | ICD-10-CM | POA: Diagnosis not present

## 2018-05-30 NOTE — Addendum Note (Signed)
Addended by: Ulice Brilliant T on: 05/30/2018 04:28 PM   Modules accepted: Orders

## 2018-05-30 NOTE — Progress Notes (Signed)
05/30/2018 Megan Hull   August 22, 1940  [id number]  Primary Physician [name], DO Primary Cardiologist: Dr Gwenlyn Found  HPI: [name] is a 77 year old female who is been followed by Dr. Gwenlyn Found.  She had a heart catheterization in August 2012 that showed a 60% mid LAD narrowing.  Myoview study in August 2017 was low risk.  She has a history of hypertension.  She has had angioedema secondary to an ACE inhibitor and lower extremity edema from amlodipine.  She was seen in the emergency room 05/21/2018 after she developed substernal chest pain at home.  Patient says she had an episode of "SVT" while sitting on the couch watching the football game.  This broke with a Valsalva maneuver, followed by palpitations and SSCP. The patient has had palpitations and ectopy in the past but is never had documented SVT.  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  The patient says she can tell when her blood pressures elevated because she gets left neck pain.  She denies any exertional chest pain or neck pain.  She has had DOE for the past few months.  Last night she says she was getting ready for bed and had left neck pain.  She checked her blood pressure and it was elevated 178/90.  She tells me this is how she knows her B/P is too high- neck pain.  She took an extra hydralazine and went to bed.  Today in the office her blood pressure is 128/68.    Current Outpatient Medications  Medication Sig Dispense Refill  . Alum & Mag Hydroxide-Simeth (ANTACID ADVANCED PO) Take 1 tablet by mouth daily as needed (indigestion).     [name] aspirin 81 MG tablet Take 1 tablet (81 mg total) by mouth daily. 30 tablet   . azithromycin (ZITHROMAX) 250 MG tablet Take two tabs today and then one tab daily until finished. 6 tablet 0  . carvedilol (COREG) 12.5 MG tablet Take 1.5 tablets (18.75 mg total) by mouth 2 (two) times daily with a  meal. 270 tablet 3  . cetirizine (ZYRTEC) 10 MG tablet Take 10 mg by mouth daily.     . Cholecalciferol (VITAMIN D3) 5000 units CAPS Take 1 capsule by mouth daily.    . diphenhydrAMINE (BENADRYL ALLERGY) 25 MG tablet Take 2 tablets by mouth as needed for anaphylaxis.    [name] EPINEPHrine 0.3 mg/0.3 mL IJ SOAJ injection Inject 0.3 mLs (0.3 mg total) into the muscle once as needed (anaphylaxis). 2 Device 0  . furosemide (LASIX) 40 MG tablet Take 1.5 tablets (60 mg total) by mouth daily. Take 1.5 tablet daily. Take 40 mg in the mornings and 20 mg in the evenings 130 tablet 3  . hydrALAZINE (APRESOLINE) 25 MG tablet Take 1 tablet (25 mg total) by mouth 3 (three) times daily. 270 tablet 3  . isosorbide mononitrate (IMDUR) 30 MG 24 hr tablet Take 1 tablet (30 mg total) by mouth daily. 90 tablet 3  . montelukast (SINGULAIR) 10 MG tablet Take 10 mg by mouth at bedtime.    . Probiotic Product (PROBIOTIC DAILY PO) Take by mouth.    . simvastatin (ZOCOR) 40 MG tablet TAKE 1 TABLET DAILY. (Patient taking differently: Take 40 mg by mouth daily. ) 90 tablet 2   No current facility-administered medications for this visit.     Allergies  Allergen Reactions  . Nexium [Esomeprazole Magnesium] Anaphylaxis  Pt states she was unable to swallow for several hours after taking the nexium  . Other Hives, Shortness Of Breath and Other (See Comments)    Red Fish (Hives) Bojangle's chicken (swelling and shortness of breath) PAPRIKA  . Codeine Other (See Comments)    REACTION: chest pain  . Ace Inhibitors Swelling    Angioedema   . Latex Other (See Comments)    Causes skin to turn red per patient   . Monosodium Glutamate Other (See Comments)    dizziness  . Amlodipine Other (See Comments)    Lower extremity swelling, itching  . Shellfish Allergy Hives  . Tape Rash    Coban wrap turned her arm red (after stress test)    Past Medical History:  Diagnosis Date  . Arthritis   . Diabetes mellitus without  complication (HCC)    borderline  . Diarrhea   . Diverticulosis   . Dysrhythmia    controlled with Metoprolol-12-19-13 LOV -Dr. Gwenlyn Found sees yearly  . Family history of premature CAD   . GERD (gastroesophageal reflux disease)   . Heart murmur    yrs ago  . HLD (hyperlipidemia)   . Hypertension   . Lower extremity edema   . Status post dilation of esophageal narrowing   . Supraventricular arrhythmia   . Tobacco abuse     Social History   Socioeconomic History  . Marital status: Single    Spouse name: Not on file  . Number of children: 0  . Years of education: 12+  . Highest education level: Not on file  Occupational History  . 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 Financial resource strain: Not on file  . Food insecurity:    Worry: Not on file    Inability: Not on file  . Transportation needs:    Medical: Not on file    Non-medical: Not on file  Tobacco Use  . Smoking status: Former Smoker    Packs/day: 0.25    Years: 15.00    Pack years: 3.75    Types: Cigarettes    Last attempt to quit: 02/04/2016    Years since quitting: 2.3  . Smokeless tobacco: Never Used  Substance and Sexual Activity  . Alcohol use: Yes    Comment: occasional - tequila shots on holidays  . Drug use: No  . Sexual activity: Not on file  Lifestyle  . Physical activity:    Days per week: Not on file    Minutes per session: Not on file  . Stress: Not on file  Relationships  . Social connections:    Talks on phone: Not on file    Gets together: Not on file    Attends religious service: Not on file    Active member of club or organization: Not on file    Attends meetings of clubs or organizations: Not on file    Relationship status: Not on file  . Intimate partner violence:    Fear of current or ex partner: Not on file    Emotionally abused: Not on file    Physically abused: Not on file    Forced sexual activity: Not on file  Other Topics Concern  . Not on file  Social History Narrative    Lives with sister   Caffeine use: 2 cups coffee per day (mixed decaf/regular)     Family History  Problem Relation Age of Onset  . Heart disease Brother        MI @ 58,  HTN @ 62, DM @ 47   . Heart disease Father        MI  . Heart disease Mother        MI, CVA @ 30  . Heart disease Sister        CHF, CVA  . Breast cancer Sister 83  . Esophageal cancer Brother   . Alzheimer's disease Sister   . Heart disease Brother        MI in 74s, lung cancer  . Heart disease Sister   . Hypertension Sister        x2  . Hyperlipidemia Sister        x2  . Diabetes Sister        x2  . Cancer Sister   . Hypertension Brother   . Colon cancer Neg Hx      Review of Systems: General: negative for chills, fever, night sweats or weight changes.  Cardiovascular: negative for chest pain, edema, orthopnea Dermatological: negative for rash Respiratory: negative for cough or wheezing Urologic: negative for hematuria Abdominal: negative for nausea, vomiting, diarrhea, bright red blood per rectum, melena, or hematemesis Neurologic: negative for visual changes, syncope, or dizziness All other systems reviewed and are otherwise negative except as noted above.    Blood pressure 128/68, pulse 71, height 5\' 5"  (1.651 m), weight 173 lb 8 oz (78.7 kg), SpO2 97 %.  General appearance: alert, cooperative and no distress Neck: no carotid bruit and no JVD Lungs: clear to auscultation bilaterally Heart: regular rate and rhythm Extremities: no edema Skin: Skin color, texture, turgor normal. No rashes or lesions Neurologic: Grossly normal  EKG NSR-71  ASSESSMENT AND PLAN:   PSVT- By history- PSVT broke with Valsalva per her history (not documented).  This is her first episode of sustained tachycardia.   DOE- No CHF on exam- B/P stable r/o anginal equivalent.   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 Myoview low risk Aug 2017  Hyperlipidemia On statin therapy. PCP following.  History of smoking- 0.25 ppd for at least 15 years- quit Aug 2017  PLAN I suggested we proceed with an echocardiogram and an exercise Myoview study.  She assures me she can walk on the treadmill.  I also suggested she take her carvedilol earlier in the evening, she had been taking it at 11 PM at night.  She also admits that she had had some caffeine that day and I suggested she avoid caffeine.   She is in the process of changing primary care providers, she will be going to [facility].  I will have her follow-up with Dr. Gwenlyn Found in a couple months, or sooner depending on her test results.  Kerin Ransom PA-C 05/30/2018 3:53 PM

## 2018-05-30 NOTE — Patient Instructions (Addendum)
Medication Instructions:   Take your Coreg night dose at 6 PM in the evenings.   If you need a refill on your cardiac medications before your next appointment, please call your pharmacy.   Lab work: None   Testing/Procedures: Your physician has requested that you have an echocardiogram. Echocardiography is a painless test that uses sound waves to create images of your heart. It provides your doctor with information about the size and shape of your heart and how well your heart's chambers and valves are working. This procedure takes approximately one hour. There are no restrictions for this procedure.  This test is done at the [address] has requested that you have an exercise tolerance test. For further information please visit [email or web address]. Please also follow instruction sheet, as given.  This test is done at [place] night before and the morning of the test   Follow-Up:  At [facility], you and your health needs are our priority.  As part of our continuing mission to provide you with exceptional heart care, we have created designated Provider Care Teams.  These Care Teams include your primary Cardiologist (physician) and Advanced Practice Providers (APPs -  Physician Assistants and Nurse Practitioners) who all work together to provide you with the care you need, when you need it. You will need a follow up appointment in 2 months with Quay Burow, MD.     Any Other Special Instructions Will Be Listed Below (If Applicable).

## 2018-05-30 NOTE — Addendum Note (Signed)
Addended by: Ulice Brilliant T on: 05/30/2018 04:27 PM   Modules accepted: Orders

## 2018-06-06 ENCOUNTER — Ambulatory Visit (HOSPITAL_COMMUNITY): Payer: Medicare HMO | Attending: Cardiovascular Disease

## 2018-06-06 DIAGNOSIS — I2510 Atherosclerotic heart disease of native coronary artery without angina pectoris: Secondary | ICD-10-CM | POA: Diagnosis not present

## 2018-06-06 DIAGNOSIS — R0602 Shortness of breath: Secondary | ICD-10-CM | POA: Diagnosis not present

## 2018-06-11 ENCOUNTER — Telehealth (HOSPITAL_COMMUNITY): Payer: Self-pay

## 2018-06-11 NOTE — Telephone Encounter (Signed)
Encounter complete. 

## 2018-06-13 ENCOUNTER — Telehealth: Payer: Self-pay | Admitting: Cardiovascular Disease

## 2018-06-13 NOTE — Telephone Encounter (Signed)
New message     Pt has questions about echo

## 2018-06-13 NOTE — Telephone Encounter (Signed)
Spoke with patient who wanted to know what her EF was. She received letter form Almyra Free LPN. Advised EF was normal. She requested copy of echo - printed and left at front for pick up as she has a stress test tomorrow 1/3

## 2018-06-14 ENCOUNTER — Ambulatory Visit (HOSPITAL_COMMUNITY)
Admission: RE | Admit: 2018-06-14 | Discharge: 2018-06-14 | Disposition: A | Discharge status: Home / Self Care | Payer: Medicare HMO | Source: Ambulatory Visit | Attending: Cardiovascular Disease | Admitting: Cardiovascular Disease

## 2018-06-14 DIAGNOSIS — I471 Supraventricular tachycardia: Secondary | ICD-10-CM | POA: Diagnosis not present

## 2018-06-14 DIAGNOSIS — R0602 Shortness of breath: Secondary | ICD-10-CM | POA: Diagnosis not present

## 2018-06-14 DIAGNOSIS — I2510 Atherosclerotic heart disease of native coronary artery without angina pectoris: Secondary | ICD-10-CM | POA: Diagnosis not present

## 2018-06-14 LAB — MYOCARDIAL PERFUSION IMAGING
LV dias vol: 73 mL (ref 46–106)
LV sys vol: 20 mL
Peak HR: 111 {beats}/min
Rest HR: 71 {beats}/min
SDS: 0
SRS: 1
SSS: 1
TID: 1.17

## 2018-06-14 MED ORDER — TECHNETIUM TC 99M TETROFOSMIN IV KIT
32.8000 | PACK | Freq: Once | INTRAVENOUS | Status: AC | PRN
  Administered 2018-06-14: 32.8 via INTRAVENOUS
  Filled 2018-06-14: qty 33

## 2018-06-14 MED ORDER — TECHNETIUM TC 99M TETROFOSMIN IV KIT
10.5000 | PACK | Freq: Once | INTRAVENOUS | Status: AC | PRN
  Administered 2018-06-14: 10.5 via INTRAVENOUS
  Filled 2018-06-14: qty 11

## 2018-06-14 MED ORDER — REGADENOSON 0.4 MG/5ML IV SOLN
0.4000 mg | Freq: Once | INTRAVENOUS | Status: AC
  Administered 2018-06-14: 0.4 mg via INTRAVENOUS

## 2018-06-18 NOTE — Progress Notes (Signed)
Left voice message for patient to call back for results.

## 2018-06-18 NOTE — Progress Notes (Signed)
The patient has been notified of the result and verbalized understanding.  All questions (if any) were answered. Jacqulynn Cadet, [place] 06/18/2018 9:12 AM

## 2018-06-20 DIAGNOSIS — I10 Essential (primary) hypertension: Secondary | ICD-10-CM | POA: Diagnosis not present

## 2018-06-20 DIAGNOSIS — E7800 Pure hypercholesterolemia, unspecified: Secondary | ICD-10-CM | POA: Diagnosis not present

## 2018-06-27 DIAGNOSIS — E785 Hyperlipidemia, unspecified: Secondary | ICD-10-CM | POA: Diagnosis not present

## 2018-06-27 DIAGNOSIS — I10 Essential (primary) hypertension: Secondary | ICD-10-CM | POA: Diagnosis not present

## 2018-06-27 DIAGNOSIS — I471 Supraventricular tachycardia: Secondary | ICD-10-CM | POA: Diagnosis not present

## 2018-06-27 DIAGNOSIS — Z0000 Encounter for general adult medical examination without abnormal findings: Secondary | ICD-10-CM | POA: Diagnosis not present

## 2018-06-27 DIAGNOSIS — E119 Type 2 diabetes mellitus without complications: Secondary | ICD-10-CM | POA: Diagnosis not present

## 2018-07-24 DIAGNOSIS — Z91018 Allergy to other foods: Secondary | ICD-10-CM | POA: Diagnosis not present

## 2018-07-24 DIAGNOSIS — W57XXXS Bitten or stung by nonvenomous insect and other nonvenomous arthropods, sequela: Secondary | ICD-10-CM | POA: Diagnosis not present

## 2018-07-30 ENCOUNTER — Ambulatory Visit: Payer: Medicare HMO | Admitting: Cardiovascular Disease

## 2018-10-24 DIAGNOSIS — Z7189 Other specified counseling: Secondary | ICD-10-CM | POA: Diagnosis not present

## 2018-10-24 DIAGNOSIS — J329 Chronic sinusitis, unspecified: Secondary | ICD-10-CM | POA: Diagnosis not present

## 2018-10-24 DIAGNOSIS — R0602 Shortness of breath: Secondary | ICD-10-CM | POA: Diagnosis not present

## 2018-10-24 DIAGNOSIS — R0609 Other forms of dyspnea: Secondary | ICD-10-CM | POA: Diagnosis not present

## 2018-10-24 DIAGNOSIS — I519 Heart disease, unspecified: Secondary | ICD-10-CM | POA: Diagnosis not present

## 2018-10-24 DIAGNOSIS — H8110 Benign paroxysmal vertigo, unspecified ear: Secondary | ICD-10-CM | POA: Diagnosis not present

## 2018-10-28 DIAGNOSIS — I519 Heart disease, unspecified: Secondary | ICD-10-CM | POA: Diagnosis not present

## 2018-10-28 DIAGNOSIS — J329 Chronic sinusitis, unspecified: Secondary | ICD-10-CM | POA: Diagnosis not present

## 2018-10-28 DIAGNOSIS — R0602 Shortness of breath: Secondary | ICD-10-CM | POA: Diagnosis not present

## 2018-10-28 DIAGNOSIS — R42 Dizziness and giddiness: Secondary | ICD-10-CM | POA: Diagnosis not present

## 2018-10-29 ENCOUNTER — Emergency Department (HOSPITAL_COMMUNITY)
Admission: EM | Admit: 2018-10-29 | Discharge: 2018-10-29 | Disposition: A | Discharge status: Home / Self Care | Payer: Medicare HMO | Attending: Emergency Medicine | Admitting: Emergency Medicine

## 2018-10-29 ENCOUNTER — Emergency Department (HOSPITAL_COMMUNITY): Payer: Medicare HMO

## 2018-10-29 ENCOUNTER — Encounter (HOSPITAL_COMMUNITY): Payer: Self-pay | Admitting: Emergency Medicine

## 2018-10-29 ENCOUNTER — Other Ambulatory Visit: Payer: Self-pay

## 2018-10-29 DIAGNOSIS — I110 Hypertensive heart disease with heart failure: Secondary | ICD-10-CM | POA: Diagnosis not present

## 2018-10-29 DIAGNOSIS — Z87891 Personal history of nicotine dependence: Secondary | ICD-10-CM | POA: Insufficient documentation

## 2018-10-29 DIAGNOSIS — Z91040 Latex allergy status: Secondary | ICD-10-CM | POA: Diagnosis not present

## 2018-10-29 DIAGNOSIS — Z79899 Other long term (current) drug therapy: Secondary | ICD-10-CM | POA: Insufficient documentation

## 2018-10-29 DIAGNOSIS — I5032 Chronic diastolic (congestive) heart failure: Secondary | ICD-10-CM | POA: Insufficient documentation

## 2018-10-29 DIAGNOSIS — Z7982 Long term (current) use of aspirin: Secondary | ICD-10-CM | POA: Insufficient documentation

## 2018-10-29 DIAGNOSIS — R0602 Shortness of breath: Secondary | ICD-10-CM | POA: Insufficient documentation

## 2018-10-29 DIAGNOSIS — E119 Type 2 diabetes mellitus without complications: Secondary | ICD-10-CM | POA: Insufficient documentation

## 2018-10-29 HISTORY — DX: Heart failure, unspecified: I50.9

## 2018-10-29 LAB — BASIC METABOLIC PANEL
Anion gap: 14 (ref 5–15)
BUN: 14 mg/dL (ref 8–23)
CO2: 25 mmol/L (ref 22–32)
Calcium: 9 mg/dL (ref 8.9–10.3)
Chloride: 99 mmol/L (ref 98–111)
Creatinine, Ser: 1.02 mg/dL — ABNORMAL HIGH (ref 0.44–1.00)
GFR calc Af Amer: >60 mL/min (ref >60)
GFR calc non Af Amer: 53 mL/min — ABNORMAL LOW (ref >60)
Glucose, Bld: 163 mg/dL — ABNORMAL HIGH (ref 70–99)
Potassium: 3.3 mmol/L — ABNORMAL LOW (ref 3.5–5.1)
Sodium: 138 mmol/L (ref 135–145)

## 2018-10-29 LAB — CBC
HCT: 40.5 % (ref 36.0–46.0)
Hemoglobin: 13.6 g/dL (ref 12.0–15.0)
MCH: 29.9 pg (ref 26.0–34.0)
MCHC: 33.6 g/dL (ref 30.0–36.0)
MCV: 89 fL (ref 80.0–100.0)
Platelets: 248 10*3/uL (ref 150–400)
RBC: 4.55 MIL/uL (ref 3.87–5.11)
RDW: 13.3 % (ref 11.5–15.5)
WBC: 6.1 10*3/uL (ref 4.0–10.5)
nRBC: 0 % (ref 0.0–0.2)

## 2018-10-29 LAB — BRAIN NATRIURETIC PEPTIDE: B Natriuretic Peptide: 50.3 pg/mL (ref 0.0–100.0)

## 2018-10-29 LAB — TROPONIN I
Troponin I: <0.03 ng/mL (ref <0.03)
Troponin I: <0.03 ng/mL (ref <0.03)

## 2018-10-29 LAB — D-DIMER, QUANTITATIVE: D-Dimer, Quant: 0.32 ug/mL-FEU (ref 0.00–0.50)

## 2018-10-29 MED ORDER — POTASSIUM CHLORIDE CRYS ER 20 MEQ PO TBCR
40.0000 meq | EXTENDED_RELEASE_TABLET | Freq: Once | ORAL | Status: AC
  Administered 2018-10-29: 19:00:00 40 meq via ORAL
  Filled 2018-10-29: qty 2

## 2018-10-29 MED ORDER — SODIUM CHLORIDE 0.9% FLUSH: 3.0000 mL | Freq: Once | INTRAVENOUS | Status: DC

## 2018-10-29 NOTE — ED Provider Notes (Signed)
a [facility] EMERGENCY DEPARTMENT Provider Note   CSN: [id number] Arrival date & time: 10/29/18  1525    History   Chief Complaint Chief Complaint  Patient presents with  . Chest Pain  . Shortness of Breath    HPI Megan Hull is a 78 y.o. female.  HPI 78 year old female with a history of arthritis, CHF, CAD, HLD, HTN presents with shortness of breath.  Patient states that last week she developed shortness of breath and upper extremity swelling.  Her PCP recommended increasing her Lasix dose to 80 mg daily which improved her symptoms.  She states that she has been urinating frequently over the weekend and that the upper extremity swelling has resolved.  She has not had any chest pain but today developed brief episode of shortness of breath.  Symptoms occurred at rest and she had no associated chest pain.  Denies upper or lower extremity swelling.  No nausea, vomiting, or abdominal pain.  No recent fevers.  Patient was placed on amoxicillin for an ear infection last week.  Her ear pain has improved.  No cough.  Past Medical History:  Diagnosis Date  . Arthritis   . CHF (congestive heart failure) ([place])   . Diabetes mellitus without complication (HCC)    borderline  . Diarrhea   . Diverticulosis   . Dysrhythmia    controlled with Metoprolol-12-19-13 LOV -Dr. Gwenlyn Found sees yearly  . Family history of premature CAD   . GERD (gastroesophageal reflux disease)   . Heart murmur    yrs ago  . HLD (hyperlipidemia)   . Hypertension   . Lower extremity edema   . Status post dilation of esophageal narrowing   . Supraventricular arrhythmia   . Tobacco abuse     Patient Active Problem List   Diagnosis Date Noted  . Chest pain 05/21/2018  . SVT (supraventricular tachycardia) ([place]) 05/21/2018  . Acute sinusitis 01/24/2018  . Chronic diastolic (congestive) heart failure ([place]) 05/31/2017  . Left leg weakness 02/10/2016  . Left foot drop 02/10/2016  . DJD  (degenerative joint disease) 01/26/2016  . History of angioedema 01/21/2016  . Cervical spondylosis with myelopathy and radiculopathy 06/08/2015  . Coronary artery disease 12/18/2014  . Esophageal stricture 04/23/2014  . Lower extremity numbness 08/11/2013  . Essential hypertension 05/15/2013  . Dyslipidemia 05/15/2013  . Dyspnea 05/15/2013  . Lower extremity edema 05/15/2013  . Diarrhea 02/12/2013  . Esophageal reflux 01/20/2010  . Dysphagia 01/20/2010    Past Surgical History:  Procedure Laterality Date  . ANTERIOR CERVICAL DECOMP/DISCECTOMY FUSION N/A 06/08/2015   Procedure: CERVICAL FIVE-SIX ,CERVICAL SIX-SEVEN ANTERIOR CERVICAL DECOMPRESSION/DISCECTOMY FUSION PLATING BONEGRAFT;  Surgeon: Kristeen Miss, MD;  Location: [facility] NEURO ORS;  Service: Neurosurgery;  Laterality: N/A;  . 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 Adora Fridge)  . CATARACT EXTRACTION, BILATERAL     most recent 11/19  . COLONOSCOPY    . ESOPHAGEAL DILATION  2014  . infected lymph node surgery  80's  . NM MYOCAR PERF WALL MOTION  04/2011   bruce myoview; normal pattern of perfusion, EF 80%, low risk scan  . TONSILLECTOMY  70's  . TRANSTHORACIC ECHOCARDIOGRAM  12/2007   mild conc LVH     OB History   No obstetric history on file.      Home Medications    Prior to Admission medications   Medication Sig Start Date End Date Taking? Authorizing Provider  aspirin 81 MG tablet  Take 1 tablet (81 mg total) by mouth daily. 03/12/18  Yes Lorretta Harp, MD  Alum & Mag Hydroxide-Simeth (ANTACID ADVANCED PO) Take 1 tablet by mouth daily as needed (indigestion).     [provider]  azithromycin (ZITHROMAX) 250 MG tablet Take two tabs today and then one tab daily until finished. 01/24/18   Rigoberto Noel, MD  carvedilol (COREG) 12.5 MG tablet Take 1.5 tablets (18.75 mg total) by mouth 2 (two) times daily with a meal. 03/12/18   Lorretta Harp, MD  cetirizine (ZYRTEC) 10  MG tablet Take 10 mg by mouth daily.     [provider]  Cholecalciferol (VITAMIN D3) 5000 units CAPS Take 1 capsule by mouth daily.    [provider]  diphenhydrAMINE (BENADRYL ALLERGY) 25 MG tablet Take 2 tablets by mouth as needed for anaphylaxis.    [provider]  EPINEPHrine 0.3 mg/0.3 mL IJ SOAJ injection Inject 0.3 mLs (0.3 mg total) into the muscle once as needed (anaphylaxis). 12/11/15   Patrecia Pour, MD  furosemide (LASIX) 40 MG tablet Take 1.5 tablets (60 mg total) by mouth daily. Take 1.5 tablet daily. Take 40 mg in the mornings and 20 mg in the evenings 03/12/18   Lorretta Harp, MD  hydrALAZINE (APRESOLINE) 25 MG tablet Take 1 tablet (25 mg total) by mouth 3 (three) times daily. 03/12/18   Lorretta Harp, MD  isosorbide mononitrate (IMDUR) 30 MG 24 hr tablet Take 1 tablet (30 mg total) by mouth daily. 03/12/18   Lorretta Harp, MD  montelukast (SINGULAIR) 10 MG tablet Take 10 mg by mouth at bedtime.    [provider]  Probiotic Product (PROBIOTIC DAILY PO) Take by mouth.    [provider]  simvastatin (ZOCOR) 40 MG tablet TAKE 1 TABLET DAILY. Patient taking differently: Take 40 mg by mouth daily.  12/11/17   Lorretta Harp, MD    Family History Family History  Problem Relation Age of Onset  . Heart disease Brother        MI @ 94, HTN @ 97, DM @ 43   . Heart disease Father        MI  . Heart disease Mother        MI, CVA @ 7  . Heart disease Sister        CHF, CVA  . Breast cancer Sister 97  . Esophageal cancer Brother   . Alzheimer's disease Sister   . Heart disease Brother        MI in 48s, lung cancer  . Heart disease Sister   . Hypertension Sister        x2  . Hyperlipidemia Sister        x2  . Diabetes Sister        x2  . Cancer Sister   . Hypertension Brother   . Colon cancer Neg Hx     Social History Social History   Tobacco Use  . Smoking status: Former Smoker    Packs/day: 0.25    Years:  15.00    Pack years: 3.75    Types: Cigarettes    Last attempt to quit: 02/04/2016    Years since quitting: 2.7  . Smokeless tobacco: Never Used  Substance Use Topics  . Alcohol use: Yes    Comment: occasional - tequila shots on holidays  . Drug use: No     Allergies   Nexium [esomeprazole magnesium]; Other; Codeine; Ace inhibitors;  Latex; Monosodium glutamate; Amlodipine; Shellfish allergy; and Tape   Review of Systems Review of Systems  Constitutional: Negative for chills and fever.  HENT: Negative for ear pain and sore throat.   Eyes: Negative for pain and visual disturbance.  Respiratory: Positive for shortness of breath. Negative for cough.   Cardiovascular: Negative for chest pain and palpitations.  Gastrointestinal: Negative for abdominal pain and vomiting.  Genitourinary: Negative for dysuria and hematuria.  Musculoskeletal: Negative for arthralgias and back pain.  Skin: Negative for color change and rash.  Neurological: Negative for seizures and syncope.  All other systems reviewed and are negative.    Physical Exam Updated Vital Signs BP (!) 169/89 (BP Location: Right Arm)   Pulse 77   Temp 98.7 F (37.1 C) (Oral)   Resp 16   Ht 5\' 5"  (1.651 m)   Wt 73.5 kg   SpO2 98%   BMI 26.96 kg/m   Physical Exam Vitals signs and nursing note reviewed.  Constitutional:      General: She is not in acute distress.    Appearance: She is well-developed.  HENT:     Head: Normocephalic and atraumatic.  Eyes:     Conjunctiva/sclera: Conjunctivae normal.  Neck:     Musculoskeletal: Neck supple.  Cardiovascular:     Rate and Rhythm: Normal rate and regular rhythm.     Heart sounds: No murmur.  Pulmonary:     Effort: Pulmonary effort is normal. No respiratory distress.     Breath sounds: Normal breath sounds.  Abdominal:     Palpations: Abdomen is soft.     Tenderness: There is no abdominal tenderness.  Musculoskeletal:     Right lower leg: No edema.     Left  lower leg: No edema.     Comments: No upper extremity swelling appreciated  Skin:    General: Skin is warm and dry.  Neurological:     General: No focal deficit present.     Mental Status: She is alert and oriented to person, place, and time.      ED Treatments / Results  Labs (all labs ordered are listed, but only abnormal results are displayed) Labs Reviewed  BASIC METABOLIC PANEL - Abnormal; Notable for the following components:      Result Value   Potassium 3.3 (*)    Glucose, Bld 163 (*)    Creatinine, Ser 1.02 (*)    GFR calc non Af Amer 53 (*)    All other components within normal limits  CBC  TROPONIN I  BRAIN NATRIURETIC PEPTIDE  D-DIMER, QUANTITATIVE (NOT AT [facility])  TROPONIN I    EKG None  Radiology Dg Chest 2 View  Result Date: 10/29/2018 CLINICAL DATA:  Chest tightness and shortness of breath. EXAM: CHEST - 2 VIEW COMPARISON:  05/21/2018 FINDINGS: Lungs are hyperexpanded. The lungs are clear without focal pneumonia, edema, pneumothorax or pleural effusion. The cardiopericardial silhouette is within normal limits for size. The visualized bony structures of the thorax are intact. IMPRESSION: No active cardiopulmonary disease. Electronically Signed   By: Misty Stanley M.D.   On: 10/29/2018 16:55    Procedures Procedures (including critical care time)  Medications Ordered in ED Medications  sodium chloride flush (NS) 0.9 % injection 3 mL (has no administration in time range)  potassium chloride SA (K-DUR) CR tablet 40 mEq (40 mEq Oral Given 10/29/18 1928)     Initial Impression / Assessment and Plan / ED Course  I have reviewed the triage vital  signs and the nursing notes.  Pertinent labs & imaging results that were available during my care of the patient were reviewed by me and considered in my medical decision making (see chart for details).  78 year old female with a history of arthritis, CHF, CAD, HLD, HTN presents with shortness of breath.   Hemodynamically stable.  Afebrile.  Patient is resting comfortably.  No increased work of breathing and clear to auscultation bilaterally on exam.  Patient called her PCP on her way to the ED.  Her PCP recommended holding her lasix and calling her in the morning.  EKG normal sinus rhythm.  No ischemic changes.  Chest x-ray unremarkable.  BMP notable for potassium 3.3, glucose 163 and creatinine of 1.02 which is improved from 5 months ago.  Patient given p.o. potassium supplementation.   D-dimer 0.32.  Doubt PE.  Troponin undetectable x2.  I have low clinical suspicion for ACS.  Patient discharged home in stable condition with strict return precautions.  Patient to follow-up with PCP tomorrow.  Final Clinical Impressions(s) / ED Diagnoses   Final diagnoses:  Shortness of breath    ED Discharge Orders    None       Trinidad Curet, MD 10/29/18 2037    Julianne Rice, MD 10/29/18 2253

## 2018-10-29 NOTE — ED Triage Notes (Signed)
Pt states she started having "cotton mouth", hot and cold, tightness in her chest and SOB suddenly today. Denies n/v. Pt states she has been retaining fluid in her arms recently.

## 2018-11-11 DIAGNOSIS — Z20828 Contact with and (suspected) exposure to other viral communicable diseases: Secondary | ICD-10-CM | POA: Diagnosis not present

## 2018-12-10 DIAGNOSIS — H40013 Open angle with borderline findings, low risk, bilateral: Secondary | ICD-10-CM | POA: Diagnosis not present

## 2018-12-10 DIAGNOSIS — Z961 Presence of intraocular lens: Secondary | ICD-10-CM | POA: Diagnosis not present

## 2018-12-10 DIAGNOSIS — H1013 Acute atopic conjunctivitis, bilateral: Secondary | ICD-10-CM | POA: Diagnosis not present

## 2018-12-10 DIAGNOSIS — E119 Type 2 diabetes mellitus without complications: Secondary | ICD-10-CM | POA: Diagnosis not present

## 2018-12-24 DIAGNOSIS — E785 Hyperlipidemia, unspecified: Secondary | ICD-10-CM | POA: Diagnosis not present

## 2018-12-24 DIAGNOSIS — E119 Type 2 diabetes mellitus without complications: Secondary | ICD-10-CM | POA: Diagnosis not present

## 2018-12-24 DIAGNOSIS — I10 Essential (primary) hypertension: Secondary | ICD-10-CM | POA: Diagnosis not present

## 2019-01-06 ENCOUNTER — Other Ambulatory Visit: Payer: Self-pay | Admitting: Cardiovascular Disease

## 2019-01-06 DIAGNOSIS — E119 Type 2 diabetes mellitus without complications: Secondary | ICD-10-CM | POA: Diagnosis not present

## 2019-01-06 DIAGNOSIS — I519 Heart disease, unspecified: Secondary | ICD-10-CM | POA: Diagnosis not present

## 2019-01-06 DIAGNOSIS — I10 Essential (primary) hypertension: Secondary | ICD-10-CM | POA: Diagnosis not present

## 2019-01-06 DIAGNOSIS — I471 Supraventricular tachycardia: Secondary | ICD-10-CM | POA: Diagnosis not present

## 2019-01-06 DIAGNOSIS — E785 Hyperlipidemia, unspecified: Secondary | ICD-10-CM | POA: Diagnosis not present

## 2019-01-06 DIAGNOSIS — Z7189 Other specified counseling: Secondary | ICD-10-CM | POA: Diagnosis not present

## 2019-01-22 ENCOUNTER — Other Ambulatory Visit: Payer: Self-pay | Admitting: Cardiovascular Disease

## 2019-01-24 ENCOUNTER — Other Ambulatory Visit: Payer: Self-pay | Admitting: Cardiovascular Disease

## 2019-01-24 DIAGNOSIS — R0609 Other forms of dyspnea: Secondary | ICD-10-CM

## 2019-01-24 DIAGNOSIS — R0600 Dyspnea, unspecified: Secondary | ICD-10-CM

## 2019-01-24 DIAGNOSIS — E785 Hyperlipidemia, unspecified: Secondary | ICD-10-CM

## 2019-01-28 ENCOUNTER — Other Ambulatory Visit: Payer: Self-pay | Admitting: Cardiovascular Disease

## 2019-01-28 DIAGNOSIS — E785 Hyperlipidemia, unspecified: Secondary | ICD-10-CM

## 2019-01-28 DIAGNOSIS — R0609 Other forms of dyspnea: Secondary | ICD-10-CM

## 2019-01-28 DIAGNOSIS — R0600 Dyspnea, unspecified: Secondary | ICD-10-CM

## 2019-01-31 MED ORDER — HYDRALAZINE HCL 25 MG PO TABS: 25.0000 mg | ORAL_TABLET | Freq: Three times a day (TID) | ORAL | 1 refills | Status: DC

## 2019-02-07 DIAGNOSIS — Z91018 Allergy to other foods: Secondary | ICD-10-CM | POA: Diagnosis not present

## 2019-02-12 DIAGNOSIS — J329 Chronic sinusitis, unspecified: Secondary | ICD-10-CM | POA: Diagnosis not present

## 2019-02-12 DIAGNOSIS — J339 Nasal polyp, unspecified: Secondary | ICD-10-CM | POA: Diagnosis not present

## 2019-02-19 DIAGNOSIS — Z91018 Allergy to other foods: Secondary | ICD-10-CM | POA: Diagnosis not present

## 2019-02-19 DIAGNOSIS — J302 Other seasonal allergic rhinitis: Secondary | ICD-10-CM | POA: Diagnosis not present

## 2019-02-21 ENCOUNTER — Other Ambulatory Visit: Payer: Self-pay | Admitting: Cardiovascular Disease

## 2019-02-28 DIAGNOSIS — J339 Nasal polyp, unspecified: Secondary | ICD-10-CM | POA: Diagnosis not present

## 2019-02-28 DIAGNOSIS — R490 Dysphonia: Secondary | ICD-10-CM | POA: Diagnosis not present

## 2019-02-28 DIAGNOSIS — J324 Chronic pansinusitis: Secondary | ICD-10-CM | POA: Diagnosis not present

## 2019-03-12 ENCOUNTER — Other Ambulatory Visit: Payer: Self-pay

## 2019-03-12 ENCOUNTER — Ambulatory Visit (INDEPENDENT_AMBULATORY_CARE_PROVIDER_SITE_OTHER): Payer: Medicare HMO | Admitting: Cardiovascular Disease

## 2019-03-12 ENCOUNTER — Encounter: Payer: Self-pay | Admitting: Cardiovascular Disease

## 2019-03-12 DIAGNOSIS — E785 Hyperlipidemia, unspecified: Secondary | ICD-10-CM | POA: Diagnosis not present

## 2019-03-12 DIAGNOSIS — I10 Essential (primary) hypertension: Secondary | ICD-10-CM

## 2019-03-12 DIAGNOSIS — I2510 Atherosclerotic heart disease of native coronary artery without angina pectoris: Secondary | ICD-10-CM | POA: Diagnosis not present

## 2019-03-12 MED ORDER — SIMVASTATIN 40 MG PO TABS: 40.0000 mg | ORAL_TABLET | Freq: Every day | ORAL | 3 refills | Status: DC

## 2019-03-12 NOTE — Progress Notes (Signed)
03/12/2019 Megan Hull   Oct 27, 1940  [id number]  Primary Physician [name], DO Primary Cardiologist: Lorretta Harp MD FACP, [place], [place], [place]  HPI:  Megan Hull is a 78 y.o.  mildly overweight, single [name] with no children whom I last saw  03/12/2018... Her problems include hypertension, hyperlipidemia, continued tobacco abuse of one-third pack per day, and family history of premature heart disease. She had a negative stress test on December 24, 2007. She was admitted to the [facility] on January 15, 2011, with jaw pain, thought to be an anginal equivalent. I catheterized her, revealing noncritical CAD with a 60% mid LAD lesion. She had anterior takeoff RCA. She has been asymptomatic since. She had carotid Dopplers performed at Dr. Roland Earl office which apparently were unremarkable. A lipid profile performed in March revealed total cholesterol of 142, LDL of 76, and HDL of 47. Her major complaint is of lower extremity edema, which gets worse during the day. I had done venous reflux studies on her back in November which were normal.since I saw her last a year and a half ago she has noticed increasing lower extremity edema which is dependent as well as increasing dyspnea on exertion. She denies chest pain. Her weight is stable same. She does admit to dietary indiscretion with regard to salt. She empirically increased her diuretic therapy. This resulted in improvement in her edema. She had recently come back from spending 5 weeks in [place] when I saw her last in July of last year and was anticipating going to [place]. I did do a Myoview stress test on her because of chest pain 01/12/16 which was normal. She developed an angioedema in August of last year probably related to her ARB. She didn't require intubation for airway protection. She had no allergy testing and ARB allergy was confirmed. She was begun on amlodipine which resulted in increasing lower extremity  edema, fluid retention and dyspnea. Her amlodipine was discontinued and she was begun on hydralazine. She returns today with complaints of increasing dyspnea on exertion and lower extremity edema. Her PCP recently increased her diuretic which resulted in some improvement in her edema.Since I saw her back 6 months ago she continues to complain of dyspnea on exertion which is progressive.   Since I saw her in the office she did see Kerin Ransom in the office 05/30/2018 complaining some neck pain and shortness of breath.  A Myoview stress test was ordered which was completely normal and a 2D echo revealed normal LV systolic function with grade 1 diastolic dysfunction.  She should remain stable since that time.  Current Meds  Medication Sig   Alum & Mag Hydroxide-Simeth (ANTACID ADVANCED PO) Take 1 tablet by mouth daily as needed (indigestion).    aspirin 81 MG tablet Take 1 tablet (81 mg total) by mouth daily.   carvedilol (COREG) 12.5 MG tablet TAKE 1 AND 1/2 TABLETS TWICE DAILY WITH A MEAL   cetirizine (ZYRTEC) 10 MG tablet Take 10 mg by mouth daily.    Cholecalciferol (VITAMIN D3 PO) Take 50 mg by mouth daily.   diphenhydrAMINE (BENADRYL ALLERGY) 25 MG tablet Take 2 tablets by mouth as needed for anaphylaxis.   EPINEPHrine 0.3 mg/0.3 mL IJ SOAJ injection Inject 0.3 mLs (0.3 mg total) into the muscle once as needed (anaphylaxis).   fluticasone (FLONASE) 50 MCG/ACT nasal spray    furosemide (LASIX) 40 MG tablet TAKE 1 TABLET IN THE MORNING AND TAKE 1/2 TABLET IN  THE EVENING ( TAKE 1 AND 1/2 TABLETS EVERY DAY )   hydrALAZINE (APRESOLINE) 25 MG tablet Take 1 tablet (25 mg total) by mouth 3 (three) times daily. Please keep upcoming appt with Dr. Gwenlyn Found in September for future refills. Thank you   isosorbide mononitrate (IMDUR) 30 MG 24 hr tablet TAKE 1 TABLET EVERY DAY   montelukast (SINGULAIR) 10 MG tablet Take 10 mg by mouth at bedtime.   Probiotic Product (PROBIOTIC DAILY PO) Take by  mouth.   simvastatin (ZOCOR) 40 MG tablet Take 1 tablet (40 mg total) by mouth daily.   [DISCONTINUED] simvastatin (ZOCOR) 40 MG tablet TAKE 1 TABLET EVERY DAY     Allergies  Allergen Reactions   Nexium [Esomeprazole Magnesium] Anaphylaxis    Pt states she was unable to swallow for several hours after taking the nexium   Other Hives, Shortness Of Breath and Other (See Comments)    Red Fish (Hives) Bojangle's chicken (swelling and shortness of breath) PAPRIKA   Codeine Other (See Comments)    REACTION: chest pain   Ace Inhibitors Swelling    Angioedema    Latex Other (See Comments)    Causes skin to turn red per patient    Monosodium Glutamate Other (See Comments)    dizziness   Amlodipine Other (See Comments)    Lower extremity swelling, itching   Shellfish Allergy Hives   Tape Rash    Coban wrap turned her arm red (after stress test)    Social History   Socioeconomic History   Marital status: Single    Spouse name: Not on file   Number of children: 0   Years of education: 12+   Highest education level: Not on file  Occupational History   Occupation: Reitred  Scientist, product/process development strain: Not on file   Food insecurity    Worry: Not on file    Inability: Not on file   Transportation needs    Medical: Not on file    Non-medical: Not on file  Tobacco Use   Smoking status: Former Smoker    Packs/day: 0.25    Years: 15.00    Pack years: 3.75    Types: Cigarettes    Quit date: 02/04/2016    Years since quitting: 3.1   Smokeless tobacco: Never Used  Substance and Sexual Activity   Alcohol use: Yes    Comment: occasional - tequila shots on holidays   Drug use: No   Sexual activity: Not on file  Lifestyle   Physical activity    Days per week: Not on file    Minutes per session: Not on file   Stress: Not on file  Relationships   Social connections    Talks on phone: Not on file    Gets together: Not on file    Attends  religious service: Not on file    Active member of club or organization: Not on file    Attends meetings of clubs or organizations: Not on file    Relationship status: Not on file   Intimate partner violence    Fear of current or ex partner: Not on file    Emotionally abused: Not on file    Physically abused: Not on file    Forced sexual activity: Not on file  Other Topics Concern   Not on file  Social History Narrative   Lives with sister   Caffeine use: 2 cups coffee per day (mixed decaf/regular)  Review of Systems: General: negative for chills, fever, night sweats or weight changes.  Cardiovascular: negative for chest pain, dyspnea on exertion, edema, orthopnea, palpitations, paroxysmal nocturnal dyspnea or shortness of breath Dermatological: negative for rash Respiratory: negative for cough or wheezing Urologic: negative for hematuria Abdominal: negative for nausea, vomiting, diarrhea, bright red blood per rectum, melena, or hematemesis Neurologic: negative for visual changes, syncope, or dizziness All other systems reviewed and are otherwise negative except as noted above.    Blood pressure (!) 110/58, pulse 60, temperature (!) 96.1 F (35.6 C), height 5' 5.5" (1.664 m), weight 173 lb (78.5 kg).  General appearance: alert and no distress Neck: no adenopathy, no carotid bruit, no JVD, supple, symmetrical, trachea midline and thyroid not enlarged, symmetric, no tenderness/mass/nodules Lungs: clear to auscultation bilaterally Heart: regular rate and rhythm, S1, S2 normal, no murmur, click, rub or gallop Extremities: extremities normal, atraumatic, no cyanosis or edema Pulses: 2+ and symmetric Skin: Skin color, texture, turgor normal. No rashes or lesions Neurologic: Alert and oriented X 3, normal strength and tone. Normal symmetric reflexes. Normal coordination and gait  EKG not performed today  ASSESSMENT AND PLAN:   Essential hypertension History of essential  hypertension her blood pressure measured today at 110/58.  She is on carvedilol and hydralazine.  Dyslipidemia History of dyslipidemia on simvastatin 40 mg daily which he only takes a couple times a week.  Her most recent lipid profile performed 12/24/2018 by her PCP revealed total cholesterol of 183, LDL 121 and HDL 42.  This is significantly higher than her last readings.  Given her prior history of CAD I suggested that she go back to taking simvastatin daily and we will recheck a lipid liver profile in 2 months.  Dyspnea Chronic dyspnea thought to be related to diastolic dysfunction.  She did have a Myoview stress test performed 06/16/2018 which is low risk and a 2D echo performed 06/06/2018 that showed normal LV systolic function with grade 1 diastolic dysfunction.  Lower extremity edema Chronic lower extremity edema managed with oral diuretics and salt restriction  Coronary artery disease History of CAD status post cardiac catheterization performed by myself 01/15/2011 revealing noncritical 60% mid LAD lesion with anterior takeoff RCA but otherwise normal coronary arteries and normal LV function.      Lorretta Harp MD FACP,FACC,FAHA, [facility] 03/12/2019 11:46 AM

## 2019-03-12 NOTE — Assessment & Plan Note (Signed)
History of CAD status post cardiac catheterization performed by myself 01/15/2011 revealing noncritical 60% mid LAD lesion with anterior takeoff RCA but otherwise normal coronary arteries and normal LV function.

## 2019-03-12 NOTE — Assessment & Plan Note (Signed)
Chronic dyspnea thought to be related to diastolic dysfunction.  She did have a Myoview stress test performed 06/16/2018 which is low risk and a 2D echo performed 06/06/2018 that showed normal LV systolic function with grade 1 diastolic dysfunction.

## 2019-03-12 NOTE — Assessment & Plan Note (Signed)
History of essential hypertension her blood pressure measured today at 110/58.  She is on carvedilol and hydralazine.

## 2019-03-12 NOTE — Assessment & Plan Note (Signed)
History of dyslipidemia on simvastatin 40 mg daily which he only takes a couple times a week.  Her most recent lipid profile performed 12/24/2018 by her PCP revealed total cholesterol of 183, LDL 121 and HDL 42.  This is significantly higher than her last readings.  Given her prior history of CAD I suggested that she go back to taking simvastatin daily and we will recheck a lipid liver profile in 2 months.

## 2019-03-12 NOTE — Patient Instructions (Signed)
Medication Instructions:  Your physician has recommended you make the following change in your medication:  RESTART YOUR SIMVASTATIN 40 MG BY MOUTH DAILY  If you need a refill on your cardiac medications before your next appointment, please call your pharmacy.   Lab work: Your physician recommends that you return for lab work in: 2 MONTHS; No Name  If you have labs (blood work) drawn today and your tests are completely normal, you will receive your results only by: [name] MyChart Message (if you have MyChart) OR . A paper copy in the mail If you have any lab test that is abnormal or we need to change your treatment, we will call you to review the results.  Testing/Procedures: NONE  Follow-Up: At [facility], you and your health needs are our priority.  As part of our continuing mission to provide you with exceptional heart care, we have created designated Provider Care Teams.  These Care Teams include your primary Cardiologist (physician) and Advanced Practice Providers (APPs -  Physician Assistants and Nurse Practitioners) who all work together to provide you with the care you need, when you need it. You will need a follow up appointment in 6 months with Megan Ransom, Megan Hull and in 12 months with Megan Hull.  Please call our office 2 months in advance to schedule each appointment.

## 2019-03-12 NOTE — Assessment & Plan Note (Signed)
Chronic lower extremity edema managed with oral diuretics and salt restriction

## 2019-03-14 DIAGNOSIS — Z23 Encounter for immunization: Secondary | ICD-10-CM | POA: Diagnosis not present

## 2019-03-24 ENCOUNTER — Other Ambulatory Visit: Payer: Self-pay | Admitting: Cardiovascular Disease

## 2019-04-08 ENCOUNTER — Other Ambulatory Visit: Payer: Self-pay | Admitting: Family Medicine

## 2019-04-08 ENCOUNTER — Institutional Professional Consult (permissible substitution): Payer: Self-pay | Admitting: Neurology

## 2019-04-08 DIAGNOSIS — Z1231 Encounter for screening mammogram for malignant neoplasm of breast: Secondary | ICD-10-CM

## 2019-04-10 DIAGNOSIS — Z01419 Encounter for gynecological examination (general) (routine) without abnormal findings: Secondary | ICD-10-CM | POA: Diagnosis not present

## 2019-04-10 DIAGNOSIS — G479 Sleep disorder, unspecified: Secondary | ICD-10-CM | POA: Diagnosis not present

## 2019-04-11 ENCOUNTER — Other Ambulatory Visit: Payer: Self-pay | Admitting: Obstetrics and Gynecology

## 2019-04-11 DIAGNOSIS — E2839 Other primary ovarian failure: Secondary | ICD-10-CM

## 2019-04-29 ENCOUNTER — Other Ambulatory Visit: Payer: Self-pay

## 2019-04-29 DIAGNOSIS — Z20822 Contact with and (suspected) exposure to covid-19: Secondary | ICD-10-CM

## 2019-05-05 LAB — NOVEL CORONAVIRUS, NAA: SARS-CoV-2, NAA: NOT DETECTED

## 2019-05-15 ENCOUNTER — Other Ambulatory Visit: Payer: Self-pay

## 2019-05-15 ENCOUNTER — Emergency Department (HOSPITAL_COMMUNITY): Payer: Medicare HMO

## 2019-05-15 ENCOUNTER — Emergency Department (HOSPITAL_COMMUNITY)
Admission: EM | Admit: 2019-05-15 | Discharge: 2019-05-16 | Disposition: A | Discharge status: Home / Self Care | Payer: Medicare HMO | Attending: Emergency Medicine | Admitting: Emergency Medicine

## 2019-05-15 DIAGNOSIS — Z79899 Other long term (current) drug therapy: Secondary | ICD-10-CM | POA: Diagnosis not present

## 2019-05-15 DIAGNOSIS — R002 Palpitations: Secondary | ICD-10-CM | POA: Insufficient documentation

## 2019-05-15 DIAGNOSIS — R0602 Shortness of breath: Secondary | ICD-10-CM | POA: Diagnosis not present

## 2019-05-15 DIAGNOSIS — I110 Hypertensive heart disease with heart failure: Secondary | ICD-10-CM | POA: Insufficient documentation

## 2019-05-15 DIAGNOSIS — Z7982 Long term (current) use of aspirin: Secondary | ICD-10-CM | POA: Insufficient documentation

## 2019-05-15 DIAGNOSIS — I5032 Chronic diastolic (congestive) heart failure: Secondary | ICD-10-CM | POA: Insufficient documentation

## 2019-05-15 DIAGNOSIS — Z87891 Personal history of nicotine dependence: Secondary | ICD-10-CM | POA: Insufficient documentation

## 2019-05-15 DIAGNOSIS — Z91040 Latex allergy status: Secondary | ICD-10-CM | POA: Diagnosis not present

## 2019-05-15 LAB — BASIC METABOLIC PANEL
Anion gap: 10 (ref 5–15)
BUN: 19 mg/dL (ref 8–23)
CO2: 27 mmol/L (ref 22–32)
Calcium: 8.9 mg/dL (ref 8.9–10.3)
Chloride: 102 mmol/L (ref 98–111)
Creatinine, Ser: 1.24 mg/dL — ABNORMAL HIGH (ref 0.44–1.00)
GFR calc Af Amer: 48 mL/min — ABNORMAL LOW (ref >60)
GFR calc non Af Amer: 41 mL/min — ABNORMAL LOW (ref >60)
Glucose, Bld: 112 mg/dL — ABNORMAL HIGH (ref 70–99)
Potassium: 3.8 mmol/L (ref 3.5–5.1)
Sodium: 139 mmol/L (ref 135–145)

## 2019-05-15 LAB — CBC
HCT: 39.3 % (ref 36.0–46.0)
Hemoglobin: 12.9 g/dL (ref 12.0–15.0)
MCH: 30.3 pg (ref 26.0–34.0)
MCHC: 32.8 g/dL (ref 30.0–36.0)
MCV: 92.3 fL (ref 80.0–100.0)
Platelets: 230 10*3/uL (ref 150–400)
RBC: 4.26 MIL/uL (ref 3.87–5.11)
RDW: 13.3 % (ref 11.5–15.5)
WBC: 6.7 10*3/uL (ref 4.0–10.5)
nRBC: 0 % (ref 0.0–0.2)

## 2019-05-15 LAB — TROPONIN I (HIGH SENSITIVITY): Troponin I (High Sensitivity): 5 ng/L (ref <18)

## 2019-05-15 NOTE — ED Notes (Signed)
[place] JONES, is pt nephew, please contact at [phone] when gets to treatment room.

## 2019-05-15 NOTE — ED Triage Notes (Signed)
Pt states palpitations started about half an hour ago, states did valsalva maneuver.

## 2019-05-16 ENCOUNTER — Telehealth: Payer: Self-pay | Admitting: Cardiovascular Disease

## 2019-05-16 LAB — TROPONIN I (HIGH SENSITIVITY): Troponin I (High Sensitivity): 4 ng/L (ref <18)

## 2019-05-16 NOTE — Telephone Encounter (Signed)
New Message:     Pt said she was seen in [facility] ER last night for palpitations. She was told to call Dr Kennon Holter Office this morning.

## 2019-05-16 NOTE — ED Provider Notes (Signed)
[facility] EMERGENCY DEPARTMENT Provider Note   CSN: [id number] Arrival date & time: 05/15/19  2130     History   Chief Complaint Chief Complaint  Patient presents with  . Palpitations    HPI Megan Hull is a 78 y.o. female.     The history is provided by the patient.  Palpitations Palpitations quality:  Fast Onset quality:  Sudden Timing:  Intermittent Progression:  Resolved Chronicity:  New Relieved by:  Valsalva Worsened by:  Nothing Associated symptoms: no cough, no syncope and no vomiting   Associated symptoms comment:  She reports a "pinch "in her left chest she reports chronic shortness of breath Patient with history of CHF, diabetes, CAD presents with palpitations.  She reports she was sitting down to watch TV when she felt her heart racing.  She did Valsalva maneuver which appeared to resolve the episode soon after that she had an episode of palpitations and tried Valsalva but did not relieve the palpitations She reports brief episode of pinching in her left chest that resolved.  Now all of her symptoms are resolved and she feels back to baseline.  She had otherwise been well prior to this episode Past Medical History:  Diagnosis Date  . Arthritis   . CHF (congestive heart failure) ([place])   . Diabetes mellitus without complication (HCC)    borderline  . Diarrhea   . Diverticulosis   . Dysrhythmia    controlled with Metoprolol-12-19-13 LOV -Dr. Gwenlyn Found sees yearly  . Family history of premature CAD   . GERD (gastroesophageal reflux disease)   . Heart murmur    yrs ago  . HLD (hyperlipidemia)   . Hypertension   . Lower extremity edema   . Status post dilation of esophageal narrowing   . Supraventricular arrhythmia   . Tobacco abuse     Patient Active Problem List   Diagnosis Date Noted  . Chest pain 05/21/2018  . SVT (supraventricular tachycardia) ([place]) 05/21/2018  . Acute sinusitis 01/24/2018  . Chronic diastolic (congestive)  heart failure ([place]) 05/31/2017  . Left leg weakness 02/10/2016  . Left foot drop 02/10/2016  . DJD (degenerative joint disease) 01/26/2016  . History of angioedema 01/21/2016  . Cervical spondylosis with myelopathy and radiculopathy 06/08/2015  . Coronary artery disease 12/18/2014  . Esophageal stricture 04/23/2014  . Lower extremity numbness 08/11/2013  . Essential hypertension 05/15/2013  . Dyslipidemia 05/15/2013  . Dyspnea 05/15/2013  . Lower extremity edema 05/15/2013  . Diarrhea 02/12/2013  . Esophageal reflux 01/20/2010  . Dysphagia 01/20/2010    Past Surgical History:  Procedure Laterality Date  . ANTERIOR CERVICAL DECOMP/DISCECTOMY FUSION N/A 06/08/2015   Procedure: CERVICAL FIVE-SIX ,CERVICAL SIX-SEVEN ANTERIOR CERVICAL DECOMPRESSION/DISCECTOMY FUSION PLATING BONEGRAFT;  Surgeon: Kristeen Miss, MD;  Location: [facility];  Service: Neurosurgery;  Laterality: N/A;  . 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 Adora Fridge)  . CATARACT EXTRACTION, BILATERAL     most recent 11/19  . COLONOSCOPY    . ESOPHAGEAL DILATION  2014  . infected lymph node surgery  80's  . NM MYOCAR PERF WALL MOTION  04/2011   bruce myoview; normal pattern of perfusion, EF 80%, low risk scan  . TONSILLECTOMY  70's  . TRANSTHORACIC ECHOCARDIOGRAM  12/2007   mild conc LVH     OB History   No obstetric history on file.      Home Medications    Prior to Admission medications  Medication Sig Start Date End Date Taking? Authorizing Provider  Alum & Mag Hydroxide-Simeth (ANTACID ADVANCED PO) Take 1 tablet by mouth daily as needed (indigestion).     [provider]  aspirin 81 MG tablet Take 1 tablet (81 mg total) by mouth daily. 03/12/18   Lorretta Harp, MD  carvedilol (COREG) 12.5 MG tablet TAKE 1 AND 1/2 TABLETS TWICE DAILY WITH MEALS 03/25/19   Lorretta Harp, MD  cetirizine (ZYRTEC) 10 MG tablet Take 10 mg by mouth daily.     [provider]  Cholecalciferol (VITAMIN D3 PO) Take 50 mg by mouth daily.    [provider]  diphenhydrAMINE (BENADRYL ALLERGY) 25 MG tablet Take 2 tablets by mouth as needed for anaphylaxis.    [provider]  EPINEPHrine 0.3 mg/0.3 mL IJ SOAJ injection Inject 0.3 mLs (0.3 mg total) into the muscle once as needed (anaphylaxis). 12/11/15   Patrecia Pour, MD  fluticasone Asencion Islam) 50 MCG/ACT nasal spray  03/10/19   [provider]  furosemide (LASIX) 40 MG tablet TAKE 1 TABLET IN THE MORNING AND TAKE 1/2 TABLET IN THE EVENING ( TAKE 1 AND 1/2 TABLETS EVERY DAY ) 01/23/19   Lorretta Harp, MD  hydrALAZINE (APRESOLINE) 25 MG tablet Take 1 tablet (25 mg total) by mouth 3 (three) times daily. Please keep upcoming appt with Dr. Gwenlyn Found in September for future refills. Thank you 01/31/19   Lorretta Harp, MD  isosorbide mononitrate (IMDUR) 30 MG 24 hr tablet TAKE 1 TABLET EVERY DAY 03/25/19   Lorretta Harp, MD  montelukast (SINGULAIR) 10 MG tablet Take 10 mg by mouth at bedtime.    [provider]  Probiotic Product (PROBIOTIC DAILY PO) Take by mouth.    [provider]  simvastatin (ZOCOR) 40 MG tablet Take 1 tablet (40 mg total) by mouth daily. 03/12/19   Lorretta Harp, MD    Family History Family History  Problem Relation Age of Onset  . Heart disease Brother        MI @ 53, HTN @ 25, DM @ 7   . Heart disease Father        MI  . Heart disease Mother        MI, CVA @ 83  . Heart disease Sister        CHF, CVA  . Breast cancer Sister 22  . Esophageal cancer Brother   . Alzheimer's disease Sister   . Heart disease Brother        MI in 68s, lung cancer  . Heart disease Sister   . Hypertension Sister        x2  . Hyperlipidemia Sister        x2  . Diabetes Sister        x2  . Cancer Sister   . Hypertension Brother   . Colon cancer Neg Hx     Social History Social History   Tobacco Use  . Smoking status: Former Smoker     Packs/day: 0.25    Years: 15.00    Pack years: 3.75    Types: Cigarettes    Quit date: 02/04/2016    Years since quitting: 3.2  . Smokeless tobacco: Never Used  Substance Use Topics  . Alcohol use: Yes    Comment: occasional - tequila shots on holidays  . Drug use: No     Allergies   Nexium [esomeprazole magnesium], Other, Codeine, Ace inhibitors, Latex, Monosodium glutamate, Amlodipine,  Shellfish allergy, and Tape   Review of Systems Review of Systems  Constitutional: Negative for fever.  Respiratory: Negative for cough.   Cardiovascular: Positive for palpitations. Negative for syncope.  Gastrointestinal: Negative for vomiting.  Neurological: Negative for syncope.  All other systems reviewed and are negative.    Physical Exam Updated Vital Signs BP 140/73 (BP Location: Right Arm)   Pulse 71   Resp (!) 181   SpO2 98% Suspect respiration listed is an error  Physical Exam CONSTITUTIONAL: Well developed, no distress sitting on the edge of the bed HEAD: Normocephalic/atraumatic EYES: EOMI ENMT: Mask in place NECK: supple no meningeal signs SPINE/BACK:entire spine nontender CV: S1/S2 noted, no murmurs/rubs/gallops noted LUNGS: Lungs are clear to auscultation bilaterally, no apparent distress ABDOMEN: soft, nontender, no rebound or guarding, bowel sounds noted throughout abdomen GU:no cva tenderness NEURO: Pt is awake/alert/appropriate, moves all extremitiesx4.  No facial droop.   EXTREMITIES: pulses normal/equal, full ROM, no significant lower extremity edema SKIN: warm, color normal PSYCH: no abnormalities of mood noted, alert and oriented to situation   ED Treatments / Results  Labs (all labs ordered are listed, but only abnormal results are displayed) Labs Reviewed  BASIC METABOLIC PANEL - Abnormal; Notable for the following components:      Result Value   Glucose, Bld 112 (*)    Creatinine, Ser 1.24 (*)    GFR calc non Af Amer 41 (*)    GFR calc Af Amer 48  (*)    All other components within normal limits  CBC  TROPONIN I (HIGH SENSITIVITY)  TROPONIN I (HIGH SENSITIVITY)    EKG EKG Interpretation  Date/Time:  Thursday May 15 2019 21:35:50 EST Ventricular Rate:  84 PR Interval:  202 QRS Duration: 76 QT Interval:  382 QTC Calculation: 451 R Axis:   3 Text Interpretation: Sinus rhythm with occasional Premature ventricular complexes Otherwise normal ECG No significant change since last tracing Confirmed by Ripley Fraise [phone]) on 05/16/2019 3:14:35 AM   Radiology Dg Chest 2 View  Result Date: 05/15/2019 CLINICAL DATA:  7 58-year-old female with shortness of breath. EXAM: CHEST - 2 VIEW COMPARISON:  Chest radiograph dated 10/29/2018. FINDINGS: Mild interstitial coarsening and chronic bronchitic changes. No focal consolidation, pleural effusion, pneumothorax. The cardiac silhouette is within normal limits. Atherosclerotic calcification of the aortic arch. No acute osseous pathology. Lower cervical ACDF. IMPRESSION: No active cardiopulmonary disease. Electronically Signed   By: Anner Crete M.D.   On: 05/15/2019 22:00    Procedures Procedures   Medications Ordered in ED Medications - No data to display   Initial Impression / Assessment and Plan / ED Course  I have reviewed the triage vital signs and the nursing notes.  Pertinent labs & imaging results that were available during my care of the patient were reviewed by me and considered in my medical decision making (see chart for details).       3:45 AM Patient presents with palpitations.  By the time of my evaluation all of her symptoms had resolved She is in no acute distress and already requesting discharge home EKG reveals a sinus rhythm She does report recent use of antihistamines which may trigger this episode.  Otherwise no other acute change her medicines that she is med compliant No indication for admission or further testing at this time discussed need to  follow-up with cardiology if she continues to have the symptoms.  She begins to have any syncope with palpitations she must return to the ER  immediately Patient agreeable with plan BP (!) 184/85 (BP Location: Right Arm)   Pulse 74   Resp 16   SpO2 98%   Final Clinical Impressions(s) / ED Diagnoses   Final diagnoses:  Palpitations    ED Discharge Orders    None       Ripley Fraise, MD 05/16/19 [phone]

## 2019-05-16 NOTE — Telephone Encounter (Signed)
Spoke with pt, today she feels fine, just tired and feels occ extra beat. She reports having an episode Sunday of irregular heart rate per her watch, then last night while watching TV her heart rate elevated. It lasted about 10-15 min and she was able to slow the rhythm by valsalva but it came right back. She is battling a sinus infection and is going to call her medical doctor regarding getting antibiotics as her covid test was negative. She went to the ER because she developed a pinching pain in her left side and that worried her. She has a follow up appointment Tuesday next week and will call prior to that appt with concerns.

## 2019-05-20 ENCOUNTER — Ambulatory Visit (INDEPENDENT_AMBULATORY_CARE_PROVIDER_SITE_OTHER): Payer: Medicare HMO | Admitting: Cardiovascular Disease

## 2019-05-20 ENCOUNTER — Encounter: Payer: Self-pay | Admitting: Cardiovascular Disease

## 2019-05-20 ENCOUNTER — Other Ambulatory Visit: Payer: Self-pay

## 2019-05-20 DIAGNOSIS — I471 Supraventricular tachycardia, unspecified: Secondary | ICD-10-CM

## 2019-05-20 DIAGNOSIS — I10 Essential (primary) hypertension: Secondary | ICD-10-CM | POA: Diagnosis not present

## 2019-05-20 MED ORDER — CARVEDILOL 25 MG PO TABS: 25.0000 mg | ORAL_TABLET | Freq: Two times a day (BID) | ORAL | 3 refills | Status: DC

## 2019-05-20 NOTE — Assessment & Plan Note (Signed)
History of essential hypertension with blood pressure measured today 124/70.  She is on carvedilol and hydralazine.  Her blood pressures have been running moderately elevated at night.  She is also recently been seen for tachypalpitations emergency room.  I am going to increase her carvedilol from 18.75 to 25 mg p.o. twice daily.

## 2019-05-20 NOTE — Patient Instructions (Signed)
Medication Instructions:  Increase Carvedilol to 25mg  Twice Daily  If you need a refill on your cardiac medications before your next appointment, please call your pharmacy.   Lab work: NONE  Testing/Procedures: NONE  Follow-Up: At [facility], you and your health needs are our priority.  As part of our continuing mission to provide you with exceptional heart care, we have created designated Provider Care Teams.  These Care Teams include your primary Cardiologist (physician) and Advanced Practice Providers (APPs -  Physician Assistants and Nurse Practitioners) who all work together to provide you with the care you need, when you need it. You may see Dr Gwenlyn Found or one of the following Advanced Practice Providers on your designated Care Team:    Kerin Ransom, PA-C  [place], [place]  Coletta Memos, [place]  Your physician wants you to follow-up in: 6 months

## 2019-05-20 NOTE — Progress Notes (Signed)
05/20/2019 Megan Hull   10-Jul-1940  [id number]  Primary Physician Megan Morning, DO Primary Cardiologist: Megan Harp MD FACP, Megan Hull, Megan Hull, Megan Hull  HPI:  Megan Hull is a 78 y.o.  mildly overweight, single [name] with no children whom I last saw  03/12/2019... Her problems include hypertension, hyperlipidemia, continued tobacco abuse of one-third pack per day, and family history of premature heart disease. She had a negative stress test on December 24, 2007. She was admitted to the [facility] on January 15, 2011, with jaw pain, thought to be an anginal equivalent. I catheterized her, revealing noncritical CAD with a 60% mid LAD lesion. She had anterior takeoff RCA. She has been asymptomatic since. She had carotid Dopplers performed at Megan Hull office which apparently were unremarkable. A lipid profile performed in March revealed total cholesterol of 142, LDL of 76, and HDL of 47. Her major complaint is of lower extremity edema, which gets worse during the day. I had done venous reflux studies on her back in November which were normal.since I saw her last a year and a half ago she has noticed increasing lower extremity edema which is dependent as well as increasing dyspnea on exertion. She denies chest pain. Her weight is stable same. She does admit to dietary indiscretion with regard to salt. She empirically increased her diuretic therapy. This resulted in improvement in her edema. She had recently come back from spending 5 weeks in [place] when I saw her last in July of last year and was anticipating going to [place]. I did do a Myoview stress test on her because of chest pain 01/12/16 which was normal. She developed an angioedema in August of last year probably related to her ARB. She didn't require intubation for airway protection. She had no allergy testing and ARB allergy was confirmed. She was begun on amlodipine which resulted in increasing lower extremity  edema, fluid retention and dyspnea. Her amlodipine was discontinued and she was begun on hydralazine. She returns today with complaints of increasing dyspnea on exertion and lower extremity edema. Her PCP recently increased her diuretic which resulted in some improvement in her edema.Since I saw her back 6 months ago she continues to complain of dyspnea on exertion which is progressive.  She saw  Megan Hull in the office 05/30/2018 complaining some neck pain and shortness of breath.  A Myoview stress test was ordered which was completely normal and a 2D echo revealed normal LV systolic function with grade 1 diastolic dysfunction.    I last saw her 2 months ago and she was stable at that time.  She was seen in the emergency room by Dr. Christy Hull on 05/16/2019 with tachypalpitations which began at night while she was watching TV.  She she thought that she was in bigeminy or had irregular heart rhythm.  She tried a Valsalva maneuver which did not work.  When she arrived to the emergency room she was in sinus rhythm.  She does admit to using decongestants but no pseudoephedrine.  She denies chest pain or shortness of breath.   Current Meds  Medication Sig   Alum & Mag Hydroxide-Simeth (ANTACID ADVANCED PO) Take 1 tablet by mouth daily as needed (indigestion).    aspirin 81 MG tablet Take 1 tablet (81 mg total) by mouth daily.   carvedilol (COREG) 25 MG tablet Take 1 tablet (25 mg total) by mouth 2 (two) times daily with a meal.   cetirizine (ZYRTEC) 10  MG tablet Take 10 mg by mouth daily.    Cholecalciferol (VITAMIN D3 PO) Take 50 mg by mouth daily.   diphenhydrAMINE (BENADRYL ALLERGY) 25 MG tablet Take 2 tablets by mouth as needed for anaphylaxis.   EPINEPHrine 0.3 mg/0.3 mL IJ SOAJ injection Inject 0.3 mLs (0.3 mg total) into the muscle once as needed (anaphylaxis).   fluticasone (FLONASE) 50 MCG/ACT nasal spray    furosemide (LASIX) 40 MG tablet TAKE 1 TABLET IN THE Hull AND TAKE 1/2  TABLET IN THE EVENING ( TAKE 1 AND 1/2 TABLETS EVERY DAY )   hydrALAZINE (APRESOLINE) 25 MG tablet Take 1 tablet (25 mg total) by mouth 3 (three) times daily. Please keep upcoming appt with Dr. Gwenlyn Hull in September for future refills. Thank you   isosorbide mononitrate (IMDUR) 30 MG 24 hr tablet TAKE 1 TABLET EVERY DAY   levofloxacin (LEVAQUIN) 500 MG tablet Take 500 mg by mouth daily.   montelukast (SINGULAIR) 10 MG tablet Take 10 mg by mouth at bedtime.   Multiple Vitamins-Minerals (AIRBORNE PO) Take 4 tablets by mouth daily.   Probiotic Product (PROBIOTIC DAILY PO) Take by mouth.   simvastatin (ZOCOR) 40 MG tablet Take 1 tablet (40 mg total) by mouth daily.   vitamin C (ASCORBIC ACID) 500 MG tablet Take 500 mg by mouth daily.   [DISCONTINUED] carvedilol (COREG) 12.5 MG tablet TAKE 1 AND 1/2 TABLETS TWICE DAILY WITH MEALS   [DISCONTINUED] carvedilol (COREG) 25 MG tablet Take 1 tablet (25 mg total) by mouth 2 (two) times daily with a meal.     Allergies  Allergen Reactions   Nexium [Esomeprazole Magnesium] Anaphylaxis    Pt states she was unable to swallow for several hours after taking the nexium   Other Hives, Shortness Of Breath and Other (See Comments)    Red Fish (Hives) Bojangle's chicken (swelling and shortness of breath) PAPRIKA   Codeine Other (See Comments)    REACTION: chest pain   Ace Inhibitors Swelling    Angioedema    Latex Other (See Comments)    Causes skin to turn red per patient    Monosodium Glutamate Other (See Comments)    dizziness   Amlodipine Other (See Comments)    Lower extremity swelling, itching   Shellfish Allergy Hives   Tape Rash    Coban wrap turned her arm red (after stress test)    Social History   Socioeconomic History   Marital status: Single    Spouse name: Not on file   Number of children: 0   Years of education: 12+   Highest education level: Not on file  Occupational History   Occupation: [employer]  Child psychotherapist strain: Not on file   Food insecurity    Worry: Not on file    Inability: Not on file   Transportation needs    Medical: Not on file    Non-medical: Not on file  Tobacco Use   Smoking status: Former Smoker    Packs/day: 0.25    Years: 15.00    Pack years: 3.75    Types: Cigarettes    Quit date: 02/04/2016    Years since quitting: 3.2   Smokeless tobacco: Never Used  Substance and Sexual Activity   Alcohol use: Yes    Comment: occasional - tequila shots on holidays   Drug use: No   Sexual activity: Not on file  Lifestyle   Physical activity    Days per week: Not on file  Minutes per session: Not on file   Stress: Not on file  Relationships   Social connections    Talks on phone: Not on file    Gets together: Not on file    Attends religious service: Not on file    Active member of club or organization: Not on file    Attends meetings of clubs or organizations: Not on file    Relationship status: Not on file   Intimate partner violence    Fear of current or ex partner: Not on file    Emotionally abused: Not on file    Physically abused: Not on file    Forced sexual activity: Not on file  Other Topics Concern   Not on file  Social History Narrative   Lives with sister   Caffeine use: 2 cups coffee per day (mixed decaf/regular)     Review of Systems: General: negative for chills, fever, night sweats or weight changes.  Cardiovascular: negative for chest pain, dyspnea on exertion, edema, orthopnea, palpitations, paroxysmal nocturnal dyspnea or shortness of breath Dermatological: negative for rash Respiratory: negative for cough or wheezing Urologic: negative for hematuria Abdominal: negative for nausea, vomiting, diarrhea, bright red blood per rectum, melena, or hematemesis Neurologic: negative for visual changes, syncope, or dizziness All other systems reviewed and are otherwise negative except as noted above.    Blood  pressure 124/70, pulse 74, temperature 97.9 F (36.6 C), height 5\' 7"  (1.702 m), weight 169 lb (76.7 kg).  General appearance: alert and no distress Neck: no adenopathy, no carotid bruit, no JVD, supple, symmetrical, trachea midline and thyroid not enlarged, symmetric, no tenderness/mass/nodules Lungs: clear to auscultation bilaterally Heart: regular rate and rhythm, S1, S2 normal, no murmur, click, rub or gallop Extremities: extremities normal, atraumatic, no cyanosis or edema Pulses: 2+ and symmetric Skin: Skin color, texture, turgor normal. No rashes or lesions Neurologic: Alert and oriented X 3, normal strength and tone. Normal symmetric reflexes. Normal coordination and gait  EKG not performed today  ASSESSMENT AND PLAN:   Essential hypertension History of essential hypertension with blood pressure measured today 124/70.  She is on carvedilol and hydralazine.  Her blood pressures have been running moderately elevated at night.  She is also recently been seen for tachypalpitations emergency room.  I am going to increase her carvedilol from 18.75 to 25 mg p.o. twice daily.  SVT (supraventricular tachycardia) (HCC) History of SVT in the past.  She was seen in the ER by Dr. Christy Hull 05/16/2019.  She had sudden onset of tachypalpitations while watching TV.  She put on her pulse ox and thought it was irregular.  She tried to break it using Valsalva.  Apparently she saw some bigeminy.  When she arrived in the emergency room she was in sinus rhythm.  She does admit to using antihistamines but no pseudoephedrine.  The episodes began prior to the interview on TV with Edmon Crape and, [place].  I have told her to increase her carvedilol from 18.75 mg to 25 mg p.o. twice daily.      Megan Harp MD FACP,FACC,FAHA, [facility] 05/20/2019 3:15 PM

## 2019-05-20 NOTE — Assessment & Plan Note (Signed)
History of SVT in the past.  She was seen in the ER by Dr. Christy Gentles 05/16/2019.  She had sudden onset of tachypalpitations while watching TV.  She put on her pulse ox and thought it was irregular.  She tried to break it using Valsalva.  Apparently she saw some bigeminy.  When she arrived in the emergency room she was in sinus rhythm.  She does admit to using antihistamines but no pseudoephedrine.  The episodes began prior to the interview on TV with Edmon Crape and, [place].  I have told her to increase her carvedilol from 18.75 mg to 25 mg p.o. twice daily.

## 2019-05-29 ENCOUNTER — Ambulatory Visit
Admission: RE | Admit: 2019-05-29 | Discharge: 2019-05-29 | Disposition: A | Discharge status: Home / Self Care | Payer: Medicare HMO | Source: Ambulatory Visit | Attending: Family Medicine | Admitting: Family Medicine

## 2019-05-29 ENCOUNTER — Other Ambulatory Visit: Payer: Self-pay

## 2019-05-29 DIAGNOSIS — Z1231 Encounter for screening mammogram for malignant neoplasm of breast: Secondary | ICD-10-CM

## 2019-06-02 ENCOUNTER — Telehealth: Payer: Self-pay | Admitting: Cardiovascular Disease

## 2019-06-02 DIAGNOSIS — R0600 Dyspnea, unspecified: Secondary | ICD-10-CM

## 2019-06-02 DIAGNOSIS — R0609 Other forms of dyspnea: Secondary | ICD-10-CM

## 2019-06-02 DIAGNOSIS — E785 Hyperlipidemia, unspecified: Secondary | ICD-10-CM

## 2019-06-02 MED ORDER — HYDRALAZINE HCL 50 MG PO TABS: 50.0000 mg | ORAL_TABLET | Freq: Three times a day (TID) | ORAL | 3 refills | Status: DC

## 2019-06-02 NOTE — Telephone Encounter (Signed)
Spoke with patient of Dr. Gwenlyn Found seen on 12/8. Coreg increased at this visit to 25mg  BID. She reports elevated BPO since this visit. She is compliant with medications - coreg, imdur, hydralazine. She reports she checks BP before each of her coreg doses. She does not often check after her meds are taken, except for today: 183/88 at 730am and then 160/73 at 11am.   Additional readings:  160/73, 175/77, 150/79, 162/81  And 2 days of twice daily readings provided: 169/89 in AM, 154/82 in PM 149/78 in AM, 137/68 in PM *lowest readings per patient  Advised will route to clinical pharmacist to review and advise. Increase hydralazine dose?

## 2019-06-02 NOTE — Telephone Encounter (Signed)
Spoke with patient and advised of med change. She will take 2-25mg  tabs of hydralazine and she is aware a refill for the 50mg  tab has been sent to [facility], per request

## 2019-06-02 NOTE — Telephone Encounter (Signed)
New message   Pt c/o BP issue: STAT if pt c/o blurred vision, one-sided weakness or slurred speech  1. What are your last 5 BP readings? 183/88, 160/73, 175/77, 150/79, 162/81  2. Are you having any other symptoms (ex. Dizziness, headache, blurred vision, passed out)? Headache, slight discomfort in chest  3. What is your BP issue? Elevated blood pressure even though doctor increased dosage

## 2019-06-02 NOTE — Telephone Encounter (Signed)
Lets increase the hydralazine to 50 mg tid.  Don't check BP at home more than twice daily unless feeling faint or dizzy.

## 2019-06-09 ENCOUNTER — Ambulatory Visit: Payer: Self-pay | Admitting: Neurology

## 2019-06-09 ENCOUNTER — Encounter

## 2019-06-10 ENCOUNTER — Other Ambulatory Visit: Payer: Self-pay | Admitting: Cardiovascular Disease

## 2019-06-23 DIAGNOSIS — J324 Chronic pansinusitis: Secondary | ICD-10-CM | POA: Diagnosis not present

## 2019-06-26 DIAGNOSIS — Z20822 Contact with and (suspected) exposure to covid-19: Secondary | ICD-10-CM | POA: Diagnosis not present

## 2019-06-28 ENCOUNTER — Other Ambulatory Visit: Payer: Self-pay

## 2019-06-28 ENCOUNTER — Emergency Department (HOSPITAL_COMMUNITY)
Admission: EM | Admit: 2019-06-28 | Discharge: 2019-06-28 | Disposition: A | Discharge status: Home / Self Care | Payer: Medicare HMO | Attending: Emergency Medicine | Admitting: Emergency Medicine

## 2019-06-28 ENCOUNTER — Encounter (HOSPITAL_COMMUNITY): Payer: Self-pay

## 2019-06-28 DIAGNOSIS — I110 Hypertensive heart disease with heart failure: Secondary | ICD-10-CM | POA: Insufficient documentation

## 2019-06-28 DIAGNOSIS — Z7982 Long term (current) use of aspirin: Secondary | ICD-10-CM | POA: Diagnosis not present

## 2019-06-28 DIAGNOSIS — R202 Paresthesia of skin: Secondary | ICD-10-CM | POA: Diagnosis not present

## 2019-06-28 DIAGNOSIS — E119 Type 2 diabetes mellitus without complications: Secondary | ICD-10-CM | POA: Insufficient documentation

## 2019-06-28 DIAGNOSIS — R29898 Other symptoms and signs involving the musculoskeletal system: Secondary | ICD-10-CM | POA: Diagnosis not present

## 2019-06-28 DIAGNOSIS — R29810 Facial weakness: Secondary | ICD-10-CM | POA: Diagnosis not present

## 2019-06-28 DIAGNOSIS — R29818 Other symptoms and signs involving the nervous system: Secondary | ICD-10-CM

## 2019-06-28 DIAGNOSIS — I5032 Chronic diastolic (congestive) heart failure: Secondary | ICD-10-CM | POA: Insufficient documentation

## 2019-06-28 DIAGNOSIS — Z79899 Other long term (current) drug therapy: Secondary | ICD-10-CM | POA: Insufficient documentation

## 2019-06-28 DIAGNOSIS — T7840XA Allergy, unspecified, initial encounter: Secondary | ICD-10-CM | POA: Diagnosis not present

## 2019-06-28 DIAGNOSIS — Z91040 Latex allergy status: Secondary | ICD-10-CM | POA: Insufficient documentation

## 2019-06-28 DIAGNOSIS — I2510 Atherosclerotic heart disease of native coronary artery without angina pectoris: Secondary | ICD-10-CM | POA: Diagnosis not present

## 2019-06-28 DIAGNOSIS — G459 Transient cerebral ischemic attack, unspecified: Secondary | ICD-10-CM | POA: Diagnosis not present

## 2019-06-28 DIAGNOSIS — R200 Anesthesia of skin: Secondary | ICD-10-CM

## 2019-06-28 DIAGNOSIS — R531 Weakness: Secondary | ICD-10-CM | POA: Diagnosis not present

## 2019-06-28 DIAGNOSIS — I491 Atrial premature depolarization: Secondary | ICD-10-CM | POA: Diagnosis not present

## 2019-06-28 DIAGNOSIS — Z87891 Personal history of nicotine dependence: Secondary | ICD-10-CM | POA: Insufficient documentation

## 2019-06-28 DIAGNOSIS — M6281 Muscle weakness (generalized): Secondary | ICD-10-CM | POA: Diagnosis not present

## 2019-06-28 DIAGNOSIS — R609 Edema, unspecified: Secondary | ICD-10-CM | POA: Diagnosis not present

## 2019-06-28 LAB — CBC WITH DIFFERENTIAL/PLATELET
Abs Immature Granulocytes: 0.01 10*3/uL (ref 0.00–0.07)
Basophils Absolute: 0 10*3/uL (ref 0.0–0.1)
Basophils Relative: 1 %
Eosinophils Absolute: 0.3 10*3/uL (ref 0.0–0.5)
Eosinophils Relative: 8 %
HCT: 40 % (ref 36.0–46.0)
Hemoglobin: 12.8 g/dL (ref 12.0–15.0)
Immature Granulocytes: 0 %
Lymphocytes Relative: 33 %
Lymphs Abs: 1.3 10*3/uL (ref 0.7–4.0)
MCH: 30.3 pg (ref 26.0–34.0)
MCHC: 32 g/dL (ref 30.0–36.0)
MCV: 94.8 fL (ref 80.0–100.0)
Monocytes Absolute: 0.5 10*3/uL (ref 0.1–1.0)
Monocytes Relative: 12 %
Neutro Abs: 1.8 10*3/uL (ref 1.7–7.7)
Neutrophils Relative %: 46 %
Platelets: 209 10*3/uL (ref 150–400)
RBC: 4.22 MIL/uL (ref 3.87–5.11)
RDW: 13.2 % (ref 11.5–15.5)
WBC: 3.9 10*3/uL — ABNORMAL LOW (ref 4.0–10.5)
nRBC: 0 % (ref 0.0–0.2)

## 2019-06-28 LAB — URINALYSIS, ROUTINE W REFLEX MICROSCOPIC
Bilirubin Urine: NEGATIVE
Glucose, UA: NEGATIVE mg/dL
Hgb urine dipstick: NEGATIVE
Ketones, ur: NEGATIVE mg/dL
Leukocytes,Ua: NEGATIVE
Nitrite: NEGATIVE
Protein, ur: NEGATIVE mg/dL
Specific Gravity, Urine: 1.008 (ref 1.005–1.030)
pH: 6 (ref 5.0–8.0)

## 2019-06-28 LAB — MAGNESIUM: Magnesium: 1.9 mg/dL (ref 1.7–2.4)

## 2019-06-28 LAB — COMPREHENSIVE METABOLIC PANEL
ALT: 12 U/L (ref 0–44)
AST: 29 U/L (ref 15–41)
Albumin: 3.4 g/dL — ABNORMAL LOW (ref 3.5–5.0)
Alkaline Phosphatase: 50 U/L (ref 38–126)
Anion gap: 12 (ref 5–15)
BUN: 13 mg/dL (ref 8–23)
CO2: 24 mmol/L (ref 22–32)
Calcium: 8.6 mg/dL — ABNORMAL LOW (ref 8.9–10.3)
Chloride: 101 mmol/L (ref 98–111)
Creatinine, Ser: 0.9 mg/dL (ref 0.44–1.00)
GFR calc Af Amer: >60 mL/min (ref >60)
GFR calc non Af Amer: >60 mL/min (ref >60)
Glucose, Bld: 130 mg/dL — ABNORMAL HIGH (ref 70–99)
Potassium: 3.8 mmol/L (ref 3.5–5.1)
Sodium: 137 mmol/L (ref 135–145)
Total Bilirubin: 0.8 mg/dL (ref 0.3–1.2)
Total Protein: 6.5 g/dL (ref 6.5–8.1)

## 2019-06-28 NOTE — ED Triage Notes (Signed)
Pt from home via EMS with reported numbness to Lt foot . Pt reports this is not new but a recurrent problem after she was intubated several years ago. Pt reports she has a upcoming Neuro appt. To be evaluated to foot numbness. EMS reported Pt was ambulatory to stretcher .  BP 146/68 , HR 74, SPOA 97%  RA., CBG 227, Temp 97.7

## 2019-06-28 NOTE — ED Notes (Signed)
Patient verbalizes understanding of discharge instructions. Opportunity for questioning and answers were provided.  pt discharged from ED, ambulatory by self   

## 2019-06-28 NOTE — ED Provider Notes (Signed)
[facility] EMERGENCY DEPARTMENT Provider Note   CSN: [id number] Arrival date & time: 06/28/19  1052     History Chief Complaint  Patient presents with  . Numbness    lt FOOT    PATRICA SPRATT is a 79 y.o. female.  The history is provided by the patient and medical records.  Neurologic Problem This is a recurrent problem. The current episode started 6 to 12 hours ago. The problem occurs constantly. The problem has been rapidly improving. Pertinent negatives include no chest pain, no abdominal pain, no headaches and no shortness of breath. Nothing aggravates the symptoms. Nothing relieves the symptoms. She has tried nothing for the symptoms. The treatment provided no relief.       Past Medical History:  Diagnosis Date  . Arthritis   . CHF (congestive heart failure) ([place])   . Diabetes mellitus without complication (HCC)    borderline  . Diarrhea   . Diverticulosis   . Dysrhythmia    controlled with Metoprolol-12-19-13 LOV -Dr. Gwenlyn Found sees yearly  . Family history of premature CAD   . GERD (gastroesophageal reflux disease)   . Heart murmur    yrs ago  . HLD (hyperlipidemia)   . Hypertension   . Lower extremity edema   . Status post dilation of esophageal narrowing   . Supraventricular arrhythmia   . Tobacco abuse     Patient Active Problem List   Diagnosis Date Noted  . Chest pain 05/21/2018  . SVT (supraventricular tachycardia) ([place]) 05/21/2018  . Acute sinusitis 01/24/2018  . Chronic diastolic (congestive) heart failure ([place]) 05/31/2017  . Left leg weakness 02/10/2016  . Left foot drop 02/10/2016  . DJD (degenerative joint disease) 01/26/2016  . History of angioedema 01/21/2016  . Cervical spondylosis with myelopathy and radiculopathy 06/08/2015  . Coronary artery disease 12/18/2014  . Esophageal stricture 04/23/2014  . Lower extremity numbness 08/11/2013  . Essential hypertension 05/15/2013  . Dyslipidemia 05/15/2013  . Dyspnea  05/15/2013  . Lower extremity edema 05/15/2013  . Diarrhea 02/12/2013  . Esophageal reflux 01/20/2010  . Dysphagia 01/20/2010    Past Surgical History:  Procedure Laterality Date  . ANTERIOR CERVICAL DECOMP/DISCECTOMY FUSION N/A 06/08/2015   Procedure: CERVICAL FIVE-SIX ,CERVICAL SIX-SEVEN ANTERIOR CERVICAL DECOMPRESSION/DISCECTOMY FUSION PLATING BONEGRAFT;  Surgeon: Kristeen Miss, MD;  Location: [facility];  Service: Neurosurgery;  Laterality: N/A;  . 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 Adora Fridge)  . CATARACT EXTRACTION, BILATERAL     most recent 11/19  . COLONOSCOPY    . ESOPHAGEAL DILATION  2014  . infected lymph node surgery  80's  . NM MYOCAR PERF WALL MOTION  04/2011   bruce myoview; normal pattern of perfusion, EF 80%, low risk scan  . TONSILLECTOMY  70's  . TRANSTHORACIC ECHOCARDIOGRAM  12/2007   mild conc LVH     OB History   No obstetric history on file.     Family History  Problem Relation Age of Onset  . Heart disease Brother        MI @ 64, HTN @ 67, DM @ 41   . Heart disease Father        MI  . Heart disease Mother        MI, CVA @ 5  . Heart disease Sister        CHF, CVA  . Breast cancer Sister 89  . Esophageal cancer Brother   . Alzheimer's disease Sister   .  Heart disease Brother        MI in 49s, lung cancer  . Heart disease Sister   . Hypertension Sister        x2  . Hyperlipidemia Sister        x2  . Diabetes Sister        x2  . Cancer Sister   . Hypertension Brother   . Colon cancer Neg Hx     Social History   Tobacco Use  . Smoking status: Former Smoker    Packs/day: 0.25    Years: 15.00    Pack years: 3.75    Types: Cigarettes    Quit date: 02/04/2016    Years since quitting: 3.3  . Smokeless tobacco: Never Used  Substance Use Topics  . Alcohol use: Yes    Comment: occasional - tequila shots on holidays  . Drug use: No    Home Medications Prior to Admission medications    Medication Sig Start Date End Date Taking? Authorizing Provider  Alum & Mag Hydroxide-Simeth (ANTACID ADVANCED PO) Take 1 tablet by mouth daily as needed (indigestion).     [provider]  aspirin 81 MG tablet Take 1 tablet (81 mg total) by mouth daily. 03/12/18   Lorretta Harp, MD  carvedilol (COREG) 25 MG tablet Take 1 tablet (25 mg total) by mouth 2 (two) times daily with a meal. 05/20/19   Lorretta Harp, MD  cetirizine (ZYRTEC) 10 MG tablet Take 10 mg by mouth daily.     [provider]  Cholecalciferol (VITAMIN D3 PO) Take 50 mg by mouth daily.    [provider]  diphenhydrAMINE (BENADRYL ALLERGY) 25 MG tablet Take 2 tablets by mouth as needed for anaphylaxis.    [provider]  EPINEPHrine 0.3 mg/0.3 mL IJ SOAJ injection Inject 0.3 mLs (0.3 mg total) into the muscle once as needed (anaphylaxis). 12/11/15   Patrecia Pour, MD  fluticasone Asencion Islam) 50 MCG/ACT nasal spray  03/10/19   [provider]  furosemide (LASIX) 40 MG tablet TAKE 1 TABLET IN THE MORNING AND TAKE 1/2 TABLET IN THE EVENING ( TAKE 1 AND 1/2 TABLETS EVERY DAY ) 01/23/19   Lorretta Harp, MD  hydrALAZINE (APRESOLINE) 50 MG tablet Take 1 tablet (50 mg total) by mouth 3 (three) times daily. 06/02/19   Lorretta Harp, MD  isosorbide mononitrate (IMDUR) 30 MG 24 hr tablet TAKE 1 TABLET EVERY DAY 06/10/19   Lorretta Harp, MD  levofloxacin (LEVAQUIN) 500 MG tablet Take 500 mg by mouth daily. 05/16/19   [provider]  montelukast (SINGULAIR) 10 MG tablet Take 10 mg by mouth at bedtime.    [provider]  Multiple Vitamins-Minerals (AIRBORNE PO) Take 4 tablets by mouth daily.    [provider]  Probiotic Product (PROBIOTIC DAILY PO) Take by mouth.    [provider]  simvastatin (ZOCOR) 40 MG tablet Take 1 tablet (40 mg total) by mouth daily. 03/12/19   Lorretta Harp, MD  vitamin C (ASCORBIC ACID) 500 MG tablet Take 500 mg by mouth  daily.    [provider]    Allergies    Nexium [esomeprazole magnesium], Other, Codeine, Ace inhibitors, Latex, Monosodium glutamate, Amlodipine, Shellfish allergy, and Tape  Review of Systems   Review of Systems  Constitutional: Negative for chills, fatigue, fever and unexpected weight change.  HENT: Negative for ear pain and sore throat.   Eyes: Negative for pain and visual disturbance.  Respiratory: Negative for cough, chest tightness, shortness of breath and wheezing.   Cardiovascular: Negative for chest pain, palpitations and leg swelling.  Gastrointestinal: Negative for abdominal pain, blood in stool, diarrhea, nausea and vomiting.  Genitourinary: Negative for dysuria, flank pain and hematuria.  Musculoskeletal: Negative for arthralgias, back pain and neck pain.  Skin: Negative for color change, rash and wound.  Neurological: Positive for weakness and numbness. Negative for dizziness, seizures, syncope, light-headedness and headaches.  All other systems reviewed and are negative.   Physical Exam Updated Vital Signs BP 122/65 (BP Location: Right Arm)   Pulse 72   Resp 20   SpO2 95%   Physical Exam Vitals and nursing note reviewed.  Constitutional:      General: She is not in acute distress.    Appearance: She is well-developed. She is not ill-appearing, toxic-appearing or diaphoretic.  HENT:     Head: Normocephalic and atraumatic.     Right Ear: External ear normal.     Left Ear: External ear normal.     Nose: Nose normal. No congestion or rhinorrhea.     Mouth/Throat:     Mouth: Mucous membranes are moist.     Pharynx: No oropharyngeal exudate.  Eyes:     Conjunctiva/sclera: Conjunctivae normal.     Pupils: Pupils are equal, round, and reactive to light.  Cardiovascular:     Rate and Rhythm: Normal rate.     Pulses: Normal pulses.     Heart sounds: No murmur.  Pulmonary:     Effort: No respiratory distress.     Breath sounds: No stridor. No wheezing,  rhonchi or rales.  Chest:     Chest wall: No tenderness.  Abdominal:     General: There is no distension.     Tenderness: There is no abdominal tenderness. There is no rebound.  Musculoskeletal:        General: No tenderness.     Cervical back: Normal range of motion and neck supple.     Right lower leg: No edema.     Left lower leg: No edema.  Skin:    General: Skin is warm.     Findings: No erythema or rash.  Neurological:     Mental Status: She is alert and oriented to person, place, and time. Mental status is at baseline.     GCS: GCS eye subscore is 4. GCS verbal subscore is 5. GCS motor subscore is 6.     Cranial Nerves: No dysarthria or facial asymmetry.     Sensory: Sensory deficit present.     Motor: Weakness present. No abnormal muscle tone.     Coordination: Coordination normal. Finger-Nose-Finger Test normal.     Deep Tendon Reflexes: Reflexes are normal and symmetric.     Comments: Mild left-sided numbness and weakness in her left leg.  She reports it is at her new baseline.  Psychiatric:        Mood and Affect: Mood normal.     ED Results / Procedures / Treatments   Labs (all labs ordered are listed, but only abnormal results are displayed) Labs Reviewed  CBC WITH DIFFERENTIAL/PLATELET - Abnormal; Notable for the following components:      Result Value   WBC 3.9 (*)    All other components within normal limits  COMPREHENSIVE METABOLIC PANEL - Abnormal; Notable for the following components:   Glucose, Bld 130 (*)    Calcium 8.6 (*)    Albumin 3.4 (*)    All  other components within normal limits  URINE CULTURE  MAGNESIUM  URINALYSIS, ROUTINE W REFLEX MICROSCOPIC    EKG None  Radiology No results found.  Procedures Procedures (including critical care time)  Medications Ordered in ED Medications - No data to display  ED Course  I have reviewed the triage vital signs and the nursing notes.  Pertinent labs & imaging results that were available  during my care of the patient were reviewed by me and considered in my medical decision making (see chart for details).    MDM Rules/Calculators/A&P                      AMIA DUPERE is a 79 y.o. female with a past medical history significant for hypertension, dyslipidemia, CHF, and prior left leg numbness and weakness who presents with transient left leg numbness and weakness.  Patient reports that several years ago she had angioedema and was intubated.  Following extubation, she had left leg numbness and weakness that took her several years to get back to nearly normal and not having to use assistance for walking.  She was followed by outpatient neurology.  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  She had imaging of her head as well as her back that did not show convincing evidence of stroke or significant abnormalities.  Patient reports that this morning she woke up and then started having severe worsening of her mild chronic left leg symptoms and she reports that it was "a dead leg" with true numbness and weakness again.  She reports it feels similar to after her extubation several years ago.  Over several hours it got back to baseline but she was concerned about the transient worsening of the numbness and weakness.  She denies any new falls, injuries, or back pain.  She denies any new stool or urine incontinence.  She does report some diarrhea for the last few days after starting Augmentin for chronic sinus infection.  She denies any headache.  She denies any vision changes, speech difficulties, or upper extremity symptoms.  She reports she is back at her previous baseline from yesterday at this time.  On exam, patient had very faint weakness with dorsiflexion on her left ankle compared to right.  She had decrease in station of the left leg which she reports is at its baseline from yesterday as well.  Good pulses on both sides.  She did not have  significant weakness with leg raise.  Reflexes were symmetric.  No clonus present.  Exam otherwise unremarkable with clear lungs, nontender chest, nontender abdomen.  Good grip strength sensation and pulses in upper extremities.  No other focal deficits present.  Had a shared decision made conversation about work-up for the patient.  We offered MRI of the brain to make sure she is not having a stroke or possible TIA however patient reports is the exact same as the sciatic nerve problem she had years ago.  We agreed to get some blood work to look for significant electrolyte imbalance however if no abnormals are discovered, patient would rather follow-up as an outpatient with her neurologist instead of getting MRIs today of her head or back.  This was felt to be reasonable decision given patient's return to baseline at this time.  Anticipate reassessment after labs and work-up.  2:52 PM  Patient's labs returned overall reassuring.  No evidence of UTI.  No significant lecture light imbalance that we see.  Patient reports he is completely back to her baseline and does not want to stay for imaging.  She would rather go home.  She denies any new complaints and will follow up with her outpatient neurologist.  This was felt to be reasonable plan and she understands return precautions for new or worsened symptoms.  Patient be discharged for outpatient neurology follow-up.   Final Clinical Impression(s) / ED Diagnoses Final diagnoses:  Transient neurologic deficit  Transient left leg weakness  Numbness and tingling of left leg     Clinical Impression: 1. Transient neurologic deficit   2. Transient left leg weakness   3. Numbness and tingling of left leg     Disposition: Discharge  Condition: Good  I have discussed the results, Dx and Tx plan with the pt(& family if present). He/she/they expressed understanding and agree(s) with the plan. Discharge instructions discussed at great length. Strict  return precautions discussed and pt &/or family have verbalized understanding of the instructions. No further questions at time of discharge.    New Prescriptions   No medications on file    Follow Up: [name], [place] [address] [phone]     Your neurologist     [facility] [address] [id number] [address] [place]       , Gwenyth Allegra, MD 06/28/19 [phone]

## 2019-06-28 NOTE — Discharge Instructions (Signed)
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  We discussed getting MRI imaging of your head or back however given the complete resolution of your symptoms and otherwise reassuring labs, we feel it is reasonable choice to follow-up with your outpatient neurology team to discuss further work-up.  If you develop any new or worsened symptoms, please return to the nearest emergency department medially.  Please rest and stay hydrated.

## 2019-06-29 LAB — URINE CULTURE: Culture: NO GROWTH

## 2019-06-30 ENCOUNTER — Encounter: Payer: Self-pay | Admitting: Emergency Medicine

## 2019-06-30 ENCOUNTER — Ambulatory Visit
Admission: EM | Admit: 2019-06-30 | Discharge: 2019-06-30 | Disposition: A | Discharge status: Home / Self Care | Payer: Medicare HMO | Attending: Physician Assistant | Admitting: Physician Assistant

## 2019-06-30 ENCOUNTER — Other Ambulatory Visit: Payer: Self-pay

## 2019-06-30 DIAGNOSIS — R059 Cough, unspecified: Secondary | ICD-10-CM

## 2019-06-30 DIAGNOSIS — R05 Cough: Secondary | ICD-10-CM | POA: Diagnosis not present

## 2019-06-30 DIAGNOSIS — Z20822 Contact with and (suspected) exposure to covid-19: Secondary | ICD-10-CM

## 2019-06-30 DIAGNOSIS — R197 Diarrhea, unspecified: Secondary | ICD-10-CM | POA: Diagnosis not present

## 2019-06-30 DIAGNOSIS — R519 Headache, unspecified: Secondary | ICD-10-CM

## 2019-06-30 NOTE — ED Provider Notes (Signed)
[facility]    CSN: [id number] Arrival date & time: 06/30/19  1157      History   Chief Complaint Chief Complaint  Patient presents with  . Exposure to COVID    HPI ALAISA Hull is a 79 y.o. female.   79 year old female with history of CHF, DM, HLD, HTN comes in for 3 day history of cough, morning sweats and chills. Had positive COVID exposure 06/22/2019. Has also had intermittent diarrhea, headache. Denies fever, body aches. Denies abdominal pain, nausea, vomiting, diarrhea. Denies shortness of breath, loss of taste/smell. Patient with history of chronic sinusitis, has post nasal drip and nasal congestion at baseline, denies worsening. She was started on augmentin 06/23/2019 for chronic sinusitis, and course was originally 3 weeks long. However, her ENT discontinued a few days ago due to diarrhea. They have plans to reassess tomorrow to possibly restart augmentin. Former smoker.  History of CHF, denies chest pain, shortness of breath, orthopnea, leg swelling.      Past Medical History:  Diagnosis Date  . Arthritis   . CHF (congestive heart failure) ([place])   . Diabetes mellitus without complication (HCC)    borderline  . Diarrhea   . Diverticulosis   . Dysrhythmia    controlled with Metoprolol-12-19-13 LOV -Dr. Gwenlyn Found sees yearly  . Family history of premature CAD   . GERD (gastroesophageal reflux disease)   . Heart murmur    yrs ago  . HLD (hyperlipidemia)   . Hypertension   . Lower extremity edema   . Status post dilation of esophageal narrowing   . Supraventricular arrhythmia   . Tobacco abuse     Patient Active Problem List   Diagnosis Date Noted  . Chest pain 05/21/2018  . SVT (supraventricular tachycardia) ([place]) 05/21/2018  . Acute sinusitis 01/24/2018  . Chronic diastolic (congestive) heart failure ([place]) 05/31/2017  . Left leg weakness 02/10/2016  . Left foot drop 02/10/2016  . DJD (degenerative joint disease) 01/26/2016  . History of  angioedema 01/21/2016  . Cervical spondylosis with myelopathy and radiculopathy 06/08/2015  . Coronary artery disease 12/18/2014  . Esophageal stricture 04/23/2014  . Lower extremity numbness 08/11/2013  . Essential hypertension 05/15/2013  . Dyslipidemia 05/15/2013  . Dyspnea 05/15/2013  . Lower extremity edema 05/15/2013  . Diarrhea 02/12/2013  . Esophageal reflux 01/20/2010  . Dysphagia 01/20/2010    Past Surgical History:  Procedure Laterality Date  . ANTERIOR CERVICAL DECOMP/DISCECTOMY FUSION N/A 06/08/2015   Procedure: CERVICAL FIVE-SIX ,CERVICAL SIX-SEVEN ANTERIOR CERVICAL DECOMPRESSION/DISCECTOMY FUSION PLATING BONEGRAFT;  Surgeon: Kristeen Miss, MD;  Location: [facility];  Service: Neurosurgery;  Laterality: N/A;  . 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 Adora Fridge)  . CATARACT EXTRACTION, BILATERAL     most recent 11/19  . COLONOSCOPY    . ESOPHAGEAL DILATION  2014  . infected lymph node surgery  80's  . NM MYOCAR PERF WALL MOTION  04/2011   bruce myoview; normal pattern of perfusion, EF 80%, low risk scan  . TONSILLECTOMY  70's  . TRANSTHORACIC ECHOCARDIOGRAM  12/2007   mild conc LVH    OB History   No obstetric history on file.      Home Medications    Prior to Admission medications   Medication Sig Start Date End Date Taking? Authorizing Provider  Alum & Mag Hydroxide-Simeth (ANTACID ADVANCED PO) Take 1 tablet by mouth daily as needed (indigestion).     [provider]  aspirin 81 MG tablet Take 1 tablet (81 mg total) by mouth daily. 03/12/18   Lorretta Harp, MD  carvedilol (COREG) 25 MG tablet Take 1 tablet (25 mg total) by mouth 2 (two) times daily with a meal. 05/20/19   Lorretta Harp, MD  cetirizine (ZYRTEC) 10 MG tablet Take 10 mg by mouth daily.     [provider]  Cholecalciferol (VITAMIN D3 PO) Take 50 mg by mouth daily.    [provider]  diphenhydrAMINE (BENADRYL ALLERGY) 25  MG tablet Take 2 tablets by mouth as needed for anaphylaxis.    [provider]  EPINEPHrine 0.3 mg/0.3 mL IJ SOAJ injection Inject 0.3 mLs (0.3 mg total) into the muscle once as needed (anaphylaxis). 12/11/15   Patrecia Pour, MD  fluticasone Asencion Islam) 50 MCG/ACT nasal spray  03/10/19   [provider]  furosemide (LASIX) 40 MG tablet TAKE 1 TABLET IN THE MORNING AND TAKE 1/2 TABLET IN THE EVENING ( TAKE 1 AND 1/2 TABLETS EVERY DAY ) 01/23/19   Lorretta Harp, MD  hydrALAZINE (APRESOLINE) 50 MG tablet Take 1 tablet (50 mg total) by mouth 3 (three) times daily. 06/02/19   Lorretta Harp, MD  isosorbide mononitrate (IMDUR) 30 MG 24 hr tablet TAKE 1 TABLET EVERY DAY 06/10/19   Lorretta Harp, MD  levofloxacin (LEVAQUIN) 500 MG tablet Take 500 mg by mouth daily. 05/16/19   [provider]  montelukast (SINGULAIR) 10 MG tablet Take 10 mg by mouth at bedtime.    [provider]  Multiple Vitamins-Minerals (AIRBORNE PO) Take 4 tablets by mouth daily.    [provider]  Probiotic Product (PROBIOTIC DAILY PO) Take by mouth.    [provider]  simvastatin (ZOCOR) 40 MG tablet Take 1 tablet (40 mg total) by mouth daily. 03/12/19   Lorretta Harp, MD  vitamin C (ASCORBIC ACID) 500 MG tablet Take 500 mg by mouth daily.    [provider]    Family History Family History  Problem Relation Age of Onset  . Heart disease Brother        MI @ 3, HTN @ 62, DM @ 46   . Heart disease Father        MI  . Heart disease Mother        MI, CVA @ 5  . Heart disease Sister        CHF, CVA  . Breast cancer Sister 32  . Esophageal cancer Brother   . Alzheimer's disease Sister   . Heart disease Brother        MI in 50s, lung cancer  . Heart disease Sister   . Hypertension Sister        x2  . Hyperlipidemia Sister        x2  . Diabetes Sister        x2  . Cancer Sister   . Hypertension Brother   . Colon cancer Neg Hx     Social History  Social History   Tobacco Use  . Smoking status: Former Smoker    Packs/day: 0.25    Years: 15.00    Pack years: 3.75    Types: Cigarettes    Quit date: 02/04/2016    Years since quitting: 3.4  . Smokeless tobacco: Never Used  Substance Use Topics  . Alcohol use: Yes    Comment: occasional - tequila shots on holidays  . Drug use: No     Allergies  Nexium [esomeprazole magnesium], Other, Codeine, Ace inhibitors, Latex, Monosodium glutamate, Amlodipine, Shellfish allergy, and Tape   Review of Systems Review of Systems  Reason unable to perform ROS: See HPI as above.     Physical Exam Triage Vital Signs ED Triage Vitals [06/30/19 1211]  Enc Vitals Group     BP 133/72     Pulse Rate 69     Resp 16     Temp 97.8 F (36.6 C)     Temp Source Temporal     SpO2 97 %     Weight      Height      Head Circumference      Peak Flow      Pain Score 0     Pain Loc      Pain Edu?      Excl. in [place]?    No data found.  Updated Vital Signs BP 133/72 (BP Location: Right Arm)   Pulse 69   Temp 97.8 F (36.6 C) (Temporal)   Resp 16   SpO2 97%   Visual Acuity Right Eye Distance:   Left Eye Distance:   Bilateral Distance:    Right Eye Near:   Left Eye Near:    Bilateral Near:     Physical Exam Constitutional:      General: She is not in acute distress.    Appearance: Normal appearance. She is not ill-appearing, toxic-appearing or diaphoretic.  HENT:     Head: Normocephalic and atraumatic.     Mouth/Throat:     Mouth: Mucous membranes are moist.     Pharynx: Oropharynx is clear. Uvula midline.  Cardiovascular:     Rate and Rhythm: Normal rate and regular rhythm.     Heart sounds: Murmur present. No friction rub. No gallop.   Pulmonary:     Effort: Pulmonary effort is normal. No accessory muscle usage, prolonged expiration, respiratory distress or retractions.     Comments: Lungs clear to auscultation without adventitious lung sounds. Musculoskeletal:      Cervical back: Normal range of motion and neck supple.  Skin:    General: Skin is warm and dry.  Neurological:     General: No focal deficit present.     Mental Status: She is alert and oriented to person, place, and time.    UC Treatments / Results  Labs (all labs ordered are listed, but only abnormal results are displayed) Labs Reviewed  NOVEL CORONAVIRUS, NAA    EKG   Radiology No results found.  Procedures Procedures (including critical care time)  Medications Ordered in UC Medications - No data to display  Initial Impression / Assessment and Plan / UC Course  I have reviewed the triage vital signs and the nursing notes.  Pertinent labs & imaging results that were available during my care of the patient were reviewed by me and considered in my medical decision making (see chart for details).    Given patient 79 year old with history of CHF, DM, HTN with 2 to 3 days symptoms, offered rapid Covid to assess for infusion clinic referral.  Patient declined at this time.  COVID PCR test ordered. Patient to quarantine until testing results return. No alarming signs on exam.  Patient speaking in full sentences without respiratory distress.  Symptomatic treatment discussed.  Push fluids.  Return precautions given.  Patient expresses understanding and agrees to plan.  Final Clinical Impressions(s) / UC Diagnoses   Final diagnoses:  Exposure to COVID-19 virus  Cough  Acute  intractable headache, unspecified headache type  Diarrhea, unspecified type   ED Prescriptions    None     PDMP not reviewed this encounter.   Ok Edwards, PA-C 06/30/19 1338

## 2019-06-30 NOTE — ED Triage Notes (Signed)
Pt presents to [facility] for assessment after being exposed to nephew on 06/22/19, states yesterday she developed a cough and some morning sweats and chills.  States she currently has a sinus infection, so she has been having nasal congestion.  C/o diarrhea Friday, and looser than normal stools over the weekend.

## 2019-06-30 NOTE — Discharge Instructions (Addendum)
COVID PCR testing ordered. I would like you to quarantine until testing results. Continue flonase/nasacort to help with nasal congestion/drainage. Tylenol/motrin for pain and fever. Keep hydrated, urine should be clear to pale yellow in color. If experiencing shortness of breath, trouble breathing, go to the emergency department for further evaluation needed.

## 2019-07-01 LAB — NOVEL CORONAVIRUS, NAA: SARS-CoV-2, NAA: NOT DETECTED

## 2019-07-04 ENCOUNTER — Other Ambulatory Visit: Payer: Medicare HMO

## 2019-07-10 DIAGNOSIS — E119 Type 2 diabetes mellitus without complications: Secondary | ICD-10-CM | POA: Diagnosis not present

## 2019-07-10 DIAGNOSIS — E785 Hyperlipidemia, unspecified: Secondary | ICD-10-CM | POA: Diagnosis not present

## 2019-07-10 DIAGNOSIS — I10 Essential (primary) hypertension: Secondary | ICD-10-CM | POA: Diagnosis not present

## 2019-07-15 DIAGNOSIS — J329 Chronic sinusitis, unspecified: Secondary | ICD-10-CM | POA: Diagnosis not present

## 2019-07-15 DIAGNOSIS — I471 Supraventricular tachycardia: Secondary | ICD-10-CM | POA: Diagnosis not present

## 2019-07-15 DIAGNOSIS — I10 Essential (primary) hypertension: Secondary | ICD-10-CM | POA: Diagnosis not present

## 2019-07-15 DIAGNOSIS — E119 Type 2 diabetes mellitus without complications: Secondary | ICD-10-CM | POA: Diagnosis not present

## 2019-07-15 DIAGNOSIS — Z0000 Encounter for general adult medical examination without abnormal findings: Secondary | ICD-10-CM | POA: Diagnosis not present

## 2019-07-21 ENCOUNTER — Ambulatory Visit: Payer: Medicare HMO | Admitting: Neurology

## 2019-07-21 ENCOUNTER — Encounter: Payer: Self-pay | Admitting: Neurology

## 2019-07-21 ENCOUNTER — Other Ambulatory Visit: Payer: Self-pay

## 2019-07-21 VITALS — BP 104/60 | HR 66 | Temp 96.8°F | Ht 65.0 in | Wt 170.0 lb

## 2019-07-21 DIAGNOSIS — G5702 Lesion of sciatic nerve, left lower limb: Secondary | ICD-10-CM

## 2019-07-21 DIAGNOSIS — R202 Paresthesia of skin: Secondary | ICD-10-CM

## 2019-07-21 DIAGNOSIS — R200 Anesthesia of skin: Secondary | ICD-10-CM

## 2019-07-21 DIAGNOSIS — J329 Chronic sinusitis, unspecified: Secondary | ICD-10-CM | POA: Diagnosis not present

## 2019-07-21 DIAGNOSIS — R220 Localized swelling, mass and lump, head: Secondary | ICD-10-CM | POA: Diagnosis not present

## 2019-07-21 DIAGNOSIS — M5432 Sciatica, left side: Secondary | ICD-10-CM

## 2019-07-21 DIAGNOSIS — J324 Chronic pansinusitis: Secondary | ICD-10-CM | POA: Diagnosis not present

## 2019-07-21 DIAGNOSIS — R29898 Other symptoms and signs involving the musculoskeletal system: Secondary | ICD-10-CM

## 2019-07-21 NOTE — Patient Instructions (Signed)
Physical Therapy   Sciatica  Sciatica is pain, numbness, weakness, or tingling along the path of the sciatic nerve. The sciatic nerve starts in the lower back and runs down the back of each leg. The nerve controls the muscles in the lower leg and in the back of the knee. It also provides feeling (sensation) to the back of the thigh, the lower leg, and the sole of the foot. Sciatica is a symptom of another medical condition that pinches or puts pressure on the sciatic nerve. Sciatica most often only affects one side of the body. Sciatica usually goes away on its own or with treatment. In some cases, sciatica may come back (recur). What are the causes? This condition is caused by pressure on the sciatic nerve or pinching of the nerve. This may be the result of:  A disk in between the bones of the spine bulging out too far (herniated disk).  Age-related changes in the spinal disks.  A pain disorder that affects a muscle in the buttock.  Extra bone growth near the sciatic nerve.  A break (fracture) of the pelvis.  Pregnancy.  Tumor. This is rare. What increases the risk? The following factors may make you more likely to develop this condition:  Playing sports that place pressure or stress on the spine.  Having poor strength and flexibility.  A history of back injury or surgery.  Sitting for long periods of time.  Doing activities that involve repetitive bending or lifting.  Obesity. What are the signs or symptoms? Symptoms can vary from mild to very severe, and they may include:  Any of these problems in the lower back, leg, hip, or buttock: ? Mild tingling, numbness, or dull aches. ? Burning sensations. ? Sharp pains.  Numbness in the back of the calf or the sole of the foot.  Leg weakness.  Severe back pain that makes movement difficult. Symptoms may get worse when you cough, sneeze, or laugh, or when you sit or stand for long periods of time. How is this  diagnosed? This condition may be diagnosed based on:  Your symptoms and medical history.  A physical exam.  Blood tests.  Imaging tests, such as: ? X-rays. ? MRI. ? CT scan. How is this treated? In many cases, this condition improves on its own without treatment. However, treatment may include:  Reducing or modifying physical activity.  Exercising and stretching.  Icing and applying heat to the affected area.  Medicines that help to: ? Relieve pain and swelling. ? Relax your muscles.  Injections of medicines that help to relieve pain, irritation, and inflammation around the sciatic nerve (steroids).  Surgery. Follow these instructions at home: Medicines  Take over-the-counter and prescription medicines only as told by your health care provider.  Ask your health care provider if the medicine prescribed to you: ? Requires you to avoid driving or using heavy machinery. ? Can cause constipation. You may need to take these actions to prevent or treat constipation:  Drink enough fluid to keep your urine pale yellow.  Take over-the-counter or prescription medicines.  Eat foods that are high in fiber, such as beans, whole grains, and fresh fruits and vegetables.  Limit foods that are high in fat and processed sugars, such as fried or sweet foods. Managing pain      If directed, put ice on the affected area. ? Put ice in a plastic bag. ? Place a towel between your skin and the bag. ? Leave the ice  on for 20 minutes, 2-3 times a day.  If directed, apply heat to the affected area. Use the heat source that your health care provider recommends, such as a moist heat pack or a heating pad. ? Place a towel between your skin and the heat source. ? Leave the heat on for 20-30 minutes. ? Remove the heat if your skin turns bright red. This is especially important if you are unable to feel pain, heat, or cold. You may have a greater risk of getting burned. Activity   Return  to your normal activities as told by your health care provider. Ask your health care provider what activities are safe for you.  Avoid activities that make your symptoms worse.  Take brief periods of rest throughout the day. ? When you rest for longer periods, mix in some mild activity or stretching between periods of rest. This will help to prevent stiffness and pain. ? Avoid sitting for long periods of time without moving. Get up and move around at least one time each hour.  Exercise and stretch regularly, as told by your health care provider.  Do not lift anything that is heavier than 10 lb (4.5 kg) while you have symptoms of sciatica. When you do not have symptoms, you should still avoid heavy lifting, especially repetitive heavy lifting.  When you lift objects, always use proper lifting technique, which includes: ? Bending your knees. ? Keeping the load close to your body. ? Avoiding twisting. General instructions  Maintain a healthy weight. Excess weight puts extra stress on your back.  Wear supportive, comfortable shoes. Avoid wearing high heels.  Avoid sleeping on a mattress that is too soft or too hard. A mattress that is firm enough to support your back when you sleep may help to reduce your pain.  Keep all follow-up visits as told by your health care provider. This is important. Contact a health care provider if:  You have pain that: ? Wakes you up when you are sleeping. ? Gets worse when you lie down. ? Is worse than you have experienced in the past. ? Lasts longer than 4 weeks.  You have an unexplained weight loss. Get help right away if:  You are not able to control when you urinate or have bowel movements (incontinence).  You have: ? Weakness in your lower back, pelvis, buttocks, or legs that gets worse. ? Redness or swelling of your back. ? A burning sensation when you urinate. Summary  Sciatica is pain, numbness, weakness, or tingling along the path of the  sciatic nerve.  This condition is caused by pressure on the sciatic nerve or pinching of the nerve.  Sciatica can cause pain, numbness, or tingling in the lower back, legs, hips, and buttocks.  Treatment often includes rest, exercise, medicines, and applying ice or heat. This information is not intended to replace advice given to you by your health care provider. Make sure you discuss any questions you have with your health care provider. Document Revised: 06/17/2018 Document Reviewed: 06/17/2018 Elsevier Patient Education  [place].

## 2019-07-21 NOTE — Progress Notes (Signed)
[facility]    Provider:  Dr Jaynee Eagles Referring Provider: [name], DO Primary Care Physician:  [name], DO  CC:  Left foot weakness and pain  HPI 07/21/2019: This is a lovely 79 year old patient referred back to use for sciatica. We saw her a few years ago for the same. She woke up one morning and couldn't lift her left leg, she had slept in a chair because she had sinus problems and couldn't breathe, she denied any upper extremity of facial or other issues, more muscle stiffness than numbness and tingling, she went to the ED and she was not having a stroke. Hurts midline in the low back. She woke up that day with stiffness, it was improving while in the ED. She had numbness and weakness in the ED, it felt like symptoms several years ago, resolved in several hours, she was offered an MRI of the brain. Now back to the same as she was before, she has some pain on the lateral side of her leg now in the peroneal distribution. No other focal neurologic deficits, associated symptoms, inciting events or modifiable factors.CMP with elevated glucose, cbc unremarkable. We will send her to PT.   I reviewed emergency room notes, patient was seen after waking up with left leg stiffness, symptoms rapidly improved, labs were reassuring CMP with elevated glucose otherwise unremarkable, unremarkable CBC, she woke up with transient left leg numbness, weakness, she reported this is very similar to what happened following extubation, symptoms she reported were worsening of symptoms she experienced in the past and still had to certain extent, over several hours went back to baseline, no new symptoms, examination showed faint weakness with dorsiflexion of her left ankle, she was back to baseline, reflexes were symmetric, examination was also reassuring, I offered her MRI of the brain but they thought this is really an 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    HPI 02/24/2016:  KEITRA CHRIST is a 79 y.o. female here as a referral from Dr. Theda Sers for left foot drop. past medical history of hypertension and cervical degenerative disc disease, hyperlipidemia, supraventricular tachycardia, asthma, prediabetes, vitamin D deficiency, sciatica left leg.Patient had no issues walking and then 3-4 years ago her leg was numb and it improved and no issues since then more recently earlier this month she was hospitalized and intubated for angioedema and when she was aware she could not feel the left leg when she woke up again she could talk and she could not feel her left leg from the calf down. She could not move the leg like it died. While in the hospital some sensation improved had some tingling. Now she feels like shocks in the leg below the knee, and pain more tightness and can;t stretch it. If she holds it in one position a lot it becomes worse, moving it helps, changing positions, no new pain, she has had foot swelling which is not new, no improvement in strength, can't move the foot or ankle at all, it "just lays there". She is wearing an orthosis, no falls and she could not walk due to foot drop. She has not been to physical therapy. No pain in the thigh. Starts below the knee int he calf. Numbness is on the lateral leg and onto the top of the toes mostly. No back pain, no radicular symptoms. No FHx of neuropathy. No other associated symptoms or focal neurolgic deficits.   Reviewed notes, labs and imaging from outside physicians, which  showed:   Reviewed notes from [place]. Patient has sciatic left leg and left leg foot drop.  Personally reviewed images of the brain and lumbar spine and agree with the following 01/26/2016: No diffusion restriction to suggest acute infarct. No abnormal susceptibility hypointensity to indicate intracranial hemorrhage. No significant T2 FLAIR signal abnormality. No focal mass effect.  Extra-axial space: Normal ventricular size. No midline shift. No  effacement of basilar cisterns. No extra-axial collection is identified. Proximal intracranial flow voids are maintained. No abnormality of the cervical medullary junction.  Other: Mucosal thickening within the left maxillary sinus, anterior ethmoid sinuses, bilateral sphenoid sinuses, and left maxillary sinus mucous retention cyst. Minimal opacification of the left mastoid tip. Orbits are unremarkable. Calvarium is unremarkable.  Conus medullaris: Extends to the L1-2 level and appears normal.  Paraspinal and other soft tissues: Negative.  Disc levels: Multilevel disc desiccation and mild disc space narrowing of the L4-5 and L5-S1. S2 subcentimeter Tarlov cyst.  L1-2: No significant disc displacement, foraminal narrowing, or canal stenosis.  L2-3: Minimal disc bulge without significant foraminal narrowing or canal stenosis.  L3-4: Small disc bulge and mild bilateral facet hypertrophy, right greater than left. Mild right foraminal narrowing. No significant canal stenosis.  L4-5: Small disc bulge and left-greater-than-right moderate facet and flavum hypertrophy. Trace bilateral facet effusions. Mild right and moderate left foraminal narrowing. Slight narrowing of the left lateral recess. No significant canal stenosis.  L5-S1: Small disc bulge and moderate bilateral facet and flavum hypertrophy. Trace right facet effusion. 8 mm T2 hyperintense structure directed posteriorly from the left facet joint into paraspinal muscles is likely a synovial cyst. Mild right and moderate left foraminal narrowing. No significant canal stenosis.  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 Trace lower lumbar degenerative facet effusions.  IMPRESSION: No acute intracranial abnormality is identified. No evidence of PRES. Moderate paranasal sinus disease.  Evaluated by neurology during recent  hospitalization. Reviewed all hospitalization notes. She presented to the ED with swollen tongue. Angioedema required intubation. patient was extubated on 8/14.  At that time she expressed she had weakness and numbness of her left LE. Patient was intubated on 8/11. She stated that she noted upon waking that she felt tingling and numbness in her left leg from her knee down. She stated that she was trying to tell the nurses however due to being intubated she could not express herself. She does recall stain on her left side for the majority of the time in ICU. On extubation she was finally able to explain that she could not feel or move her left leg. Initially it was a complete numbness. At this point she states she feels a burning and tingling sensation. In fact she states it is more sensitive on the left than the right. However, she still has no movement in her left foot and ankle. She states she has never had this issue before. She improved in the hospital to no sensation to feeling pins and needles. On exam she was not able to dorsiflex plantarflex even greater invert her left ankle. 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 Recommended EMG nerve conduction study.  CBC with mild anemia hct 11.9, bmp with elevated glucose and calcium normal creatinine.   Review of Systems: Patient complains of symptoms per HPI as well as the following symptoms: Chills, palpitations, swelling in legs, easy bruising, cough, snoring, feeling hot, feeling cold, joint pain, numbness in the leg,  weakness. Pertinent negatives per HPI. All others negative.   Social History   Socioeconomic History  . Marital status: Single    Spouse name: Not on file  . Number of children: 0  . Years of education: 12+  . Highest education level: Not on file  Occupational History  . Occupation: [employer]  Tobacco Use  . Smoking status: Former Smoker    Packs/day: 0.25    Years: 15.00    Pack years: 3.75    Types:  Cigarettes    Quit date: 02/04/2016    Years since quitting: 3.4  . Smokeless tobacco: Never Used  Substance and Sexual Activity  . Alcohol use: Not Currently    Comment: occasional - tequila shots on holidays  . Drug use: No  . Sexual activity: Not on file  Other Topics Concern  . Not on file  Social History Narrative   Lives alone    Caffeine use: 2 cups coffee per day (mixed decaf/regular)   Social Determinants of Health   Financial Resource Strain:   . Difficulty of Paying Living Expenses: Not on file  Food Insecurity:   . Worried About Charity fundraiser in the Last Year: Not on file  . Ran Out of Food in the Last Year: Not on file  Transportation Needs:   . Lack of Transportation (Medical): Not on file  . Lack of Transportation (Non-Medical): Not on file  Physical Activity:   . Days of Exercise per Week: Not on file  . Minutes of Exercise per Session: Not on file  Stress:   . Feeling of Stress : Not on file  Social Connections:   . Frequency of Communication with Friends and Family: Not on file  . Frequency of Social Gatherings with Friends and Family: Not on file  . Attends Religious Services: Not on file  . Active Member of Clubs or Organizations: Not on file  . Attends Archivist Meetings: Not on file  . Marital Status: Not on file  Intimate Partner Violence:   . Fear of Current or Ex-Partner: Not on file  . Emotionally Abused: Not on file  . Physically Abused: Not on file  . Sexually Abused: Not on file    Family History  Problem Relation Age of Onset  . Heart disease Brother        MI @ 70, HTN @ 12, DM @ 78   . Heart disease Father        MI  . Heart disease Mother        MI, CVA @ 13  . Heart disease Sister        CHF, CVA  . Breast cancer Sister 63  . Esophageal cancer Brother   . Alzheimer's disease Sister   . Heart disease Brother        MI in 51s, lung cancer  . Heart disease Sister   . Hypertension Sister        x2  .  Hyperlipidemia Sister        x2  . Diabetes Sister        x2  . Cancer Sister   . Hypertension Brother   . Colon cancer Neg Hx     Past Medical History:  Diagnosis Date  . Arthritis   . CHF (congestive heart failure) ([facility])   . Diabetes mellitus without complication (HCC)    borderline  . Diarrhea   . Diverticulosis   . Dysrhythmia    controlled with  Metoprolol-12-19-13 LOV -Dr. Gwenlyn Found sees yearly  . Family history of premature CAD   . GERD (gastroesophageal reflux disease)   . Heart murmur    yrs ago  . HLD (hyperlipidemia)    pt reports cholesterol normal now (07/21/19).  [name] Hypertension   . Lower extremity edema   . Status post dilation of esophageal narrowing   . Supraventricular arrhythmia   . Tobacco abuse     Past Surgical History:  Procedure Laterality Date  . ANTERIOR CERVICAL DECOMP/DISCECTOMY FUSION N/A 06/08/2015   Procedure: CERVICAL FIVE-SIX ,CERVICAL SIX-SEVEN ANTERIOR CERVICAL DECOMPRESSION/DISCECTOMY FUSION PLATING BONEGRAFT;  Surgeon: Kristeen Miss, MD;  Location: [facility];  Service: Neurosurgery;  Laterality: N/A;  . 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 Adora Fridge)  . CATARACT EXTRACTION, BILATERAL     most recent 11/19  . COLONOSCOPY    . ESOPHAGEAL DILATION  2014  . infected lymph node surgery  80's  . NM MYOCAR PERF WALL MOTION  04/2011   bruce myoview; normal pattern of perfusion, EF 80%, low risk scan  . TONSILLECTOMY  70's  . TRANSTHORACIC ECHOCARDIOGRAM  12/2007   mild conc LVH    Current Outpatient Medications  Medication Sig Dispense Refill  . Alum & Mag Hydroxide-Simeth (ANTACID ADVANCED PO) Take 1 tablet by mouth daily as needed (indigestion).     [name] aspirin 81 MG tablet Take 1 tablet (81 mg total) by mouth daily. 30 tablet   . carvedilol (COREG) 25 MG tablet Take 1 tablet (25 mg total) by mouth 2 (two) times daily with a meal. 180 tablet 3  . cetirizine (ZYRTEC) 10 MG tablet Take 10 mg by mouth  daily.     . Cholecalciferol (VITAMIN D3 PO) Take 50 mg by mouth daily.    . diphenhydrAMINE (BENADRYL ALLERGY) 25 MG tablet Take 2 tablets by mouth as needed for anaphylaxis.    [name] EPINEPHrine 0.3 mg/0.3 mL IJ SOAJ injection Inject 0.3 mLs (0.3 mg total) into the muscle once as needed (anaphylaxis). 2 Device 0  . fluticasone (FLONASE) 50 MCG/ACT nasal spray     . furosemide (LASIX) 40 MG tablet TAKE 1 TABLET IN THE MORNING AND TAKE 1/2 TABLET IN THE EVENING ( TAKE 1 AND 1/2 TABLETS EVERY DAY ) 130 tablet 3  . hydrALAZINE (APRESOLINE) 50 MG tablet Take 1 tablet (50 mg total) by mouth 3 (three) times daily. 270 tablet 3  . isosorbide mononitrate (IMDUR) 30 MG 24 hr tablet TAKE 1 TABLET EVERY DAY 90 tablet 0  . levofloxacin (LEVAQUIN) 500 MG tablet Take 500 mg by mouth daily.    . montelukast (SINGULAIR) 10 MG tablet Take 10 mg by mouth at bedtime.    . Multiple Vitamins-Minerals (AIRBORNE PO) Take 4 tablets by mouth daily.    . Probiotic Product (PROBIOTIC DAILY PO) Take by mouth.    . simvastatin (ZOCOR) 40 MG tablet Take 1 tablet (40 mg total) by mouth daily. 90 tablet 3  . vitamin C (ASCORBIC ACID) 500 MG tablet Take 500 mg by mouth daily.     No current facility-administered medications for this visit.    Allergies as of 07/21/2019 - Review Complete 07/21/2019  Allergen Reaction Noted  . Nexium [esomeprazole magnesium] Anaphylaxis 01/14/2013  . Other Hives, Shortness Of Breath, and Other (See Comments) 08/07/2013  . Codeine Other (See Comments)   . Ace inhibitors Swelling 01/29/2016  . Latex Other (See Comments) 06/26/2017  . Monosodium glutamate Other (See  Comments) 01/24/2016  . Amlodipine Other (See Comments) 11/23/2016  . Shellfish allergy Hives 08/07/2013  . Tape Rash 01/21/2016    Vitals: BP 104/60 (BP Location: Right Arm, Patient Position: Sitting)   Pulse 66   Temp (!) 96.8 F (36 C) Comment: taken at front  Ht 5\' 5"  (1.651 m)   Wt 170 lb (77.1 kg)   BMI 28.29 kg/m   Last Weight:  Wt Readings from Last 1 Encounters:  07/21/19 170 lb (77.1 kg)   Last Height:   Ht Readings from Last 1 Encounters:  07/21/19 5\' 5"  (1.651 m)   Physical exam: Exam: Gen: NAD, conversant, well nourised, obese, well groomed                     CV: RRR, no MRG. No Carotid Bruits.  No peripheral edema, warm, nontender Eyes: Conjunctivae clear without exudates or hemorrhage  Neuro: Detailed Neurologic Exam  Speech:    Speech is normal; fluent and spontaneous with normal comprehension.  Cognition:    The patient is oriented to person, place, and time;     recent and remote memory intact;     language fluent;     normal attention, concentration,     fund of knowledge Cranial Nerves:    The pupils are equal, round, and reactive to light.  Could not visualize the fundi due to small pupils extraocular movements are intact. Trigeminal sensation is intact and the muscles of mastication are normal. The face is symmetric. The palate elevates in the midline. Hearing intact. Voice is normal. Shoulder shrug is normal. The tongue has normal motion without fasciculations.   Coordination:    No dysmetria notes  Gait:    Normal gait, not steppage  Motor Observation:    No asymmetry, no atrophy, and no involuntary movements noted. Tone:    Decreased left lower leg  Posture:    Posture is normal. normal erect    Strength: Mild left leg flexion and foot dorsiflexion weakness       Sensation: Left lateral lower leg and dorsal left foot with hyperesthesias and allodynia.      Reflex Exam:  DTR's: Bilaterally absent AJs. Otherwise deep tendon reflexes in the upper and lower extremities are normal bilaterally.   Toes:    The toes are downgoing bilaterally.   Clonus:    Clonus is absent.       Assessment/Plan: 79 year old with left leg weakness of the sciatic muscles acute onset while hospitalized in the ICU several years ago, EMG nerve conduction study was consistent  with sciatic neuropathy, she significantly improved.  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  She is back to baseline now, we decided to send to physical therapy, but she is otherwise doing extremely well and has almost entirely resolved, she still has some numbness in the perineal nerve distribution and some faint weakness but otherwise doing great.  Return to clinic as necessary.     Sarina Ill, MD  [facility] [address]  [place], [place] [address]  Phone [phone] Fax [phone]

## 2019-07-30 ENCOUNTER — Other Ambulatory Visit: Payer: Self-pay

## 2019-07-30 ENCOUNTER — Ambulatory Visit: Payer: Medicare HMO | Admitting: Cardiology

## 2019-07-30 ENCOUNTER — Encounter: Payer: Self-pay | Admitting: Cardiology

## 2019-07-30 VITALS — BP 129/79 | HR 71 | Temp 96.7°F | Ht 65.0 in | Wt 170.4 lb

## 2019-07-30 DIAGNOSIS — I471 Supraventricular tachycardia: Secondary | ICD-10-CM

## 2019-07-30 NOTE — Progress Notes (Signed)
Cardiology Office Note:    Date:  07/30/2019   ID:  Megan Hull, DOB 1940/11/17, MRN [id number]  PCP:  [name], DO  Cardiologist:  Dr Gwenlyn Found Electrophysiologist:  None   Referring MD: [name], DO   CC:  Chronic DOE  History of Present Illness:    Megan Hull is a 79 y.o. female who is been followed by Dr. Gwenlyn Found.  She had a heart catheterization in August 2012 that showed a 60% mid LAD narrowing.  Myoview study in August 2017 was low risk.  She has a history of HTN.  She has had angioedema secondary to an ACE inhibitor and lower extremity edema from amlodipine.  She was seen in the emergency room 05/21/2018 after she developed substernal chest pain at home.  The patient says she had an episode of "SVT" while sitting on the couch watching the football game. This broke with a Valsalva maneuver, followed by palpitations and SSCP. The patient has had palpitations and ectopy in the past but is never had documented SVT.  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  The patient says she can tell when her blood pressures elevated because she gets left neck pain.   She last saw Dr Gwenlyn Found 05/20/2019.  Echo was done Dec 2019 and Myoview done Jan 2020 after she complained of neck and DOE.  Myoview was low risk.  Echo showed an EF of 60-65% with mild LVH and grade 1 DD.  She had been evaluated by pulmonary (Dr Elsworth Soho in 2019) as well but apparently it was not felt to be secondary to COPD.  She does have nasal polyps and has been referred to ENT- she tells me she may need surgery.  She presents today for routine follow up.   She brought blood pressure readings from home as well as her blood pressure machine from home.  Her readings at home are consistently high, 1 [id number] 60 systolic.  When she checked and her blood pressure by the nurse was 128/78.  I checked her pressure myself and got 124/658.  I  then had her check her blood pressure by her machine and it read 162/78.  I had a repeated and it again read 0000000 systolic.  I told her that her blood pressure machine was reading 15 to 20 mmHg higher than what her actual pressure was.  She was reassured to find this out.  From a cardiac standpoint she is done well.  She does continue to have chronic dyspnea on exertion.  She was prescribed a albuterol inhaler but has not tried using it yet.  She has had no recurrent PSVT.  Past Medical History:  Diagnosis Date  . Arthritis   . CHF (congestive heart failure) ([place])   . Diabetes mellitus without complication (HCC)    borderline  . Diarrhea   . Diverticulosis   . Dysrhythmia    controlled with Metoprolol-12-19-13 LOV -Dr. Gwenlyn Found sees yearly  . Family history of premature CAD   . GERD (gastroesophageal reflux disease)   . Heart murmur    yrs ago  . HLD (hyperlipidemia)    pt reports cholesterol normal now (07/21/19).  [name] Hypertension   . Lower extremity edema   . Status post dilation of esophageal narrowing   . Supraventricular arrhythmia   . Tobacco abuse     Past Surgical History:  Procedure Laterality Date  . ANTERIOR CERVICAL DECOMP/DISCECTOMY  FUSION N/A 06/08/2015   Procedure: CERVICAL FIVE-SIX ,CERVICAL SIX-SEVEN ANTERIOR CERVICAL DECOMPRESSION/DISCECTOMY FUSION PLATING BONEGRAFT;  Surgeon: Kristeen Miss, MD;  Location: [facility];  Service: Neurosurgery;  Laterality: N/A;  . 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 Adora Fridge)  . CATARACT EXTRACTION, BILATERAL     most recent 11/19  . COLONOSCOPY    . ESOPHAGEAL DILATION  2014  . infected lymph node surgery  80's  . NM MYOCAR PERF WALL MOTION  04/2011   bruce myoview; normal pattern of perfusion, EF 80%, low risk scan  . TONSILLECTOMY  70's  . TRANSTHORACIC ECHOCARDIOGRAM  12/2007   mild conc LVH    Current Medications: No outpatient medications have been marked as taking for the 07/30/19  encounter (Appointment) with Erlene Quan, PA-C.     Allergies:   Nexium [esomeprazole magnesium], Other, Codeine, Ace inhibitors, Latex, Monosodium glutamate, Amlodipine, Shellfish allergy, and Tape   Social History   Socioeconomic History  . Marital status: Single    Spouse name: Not on file  . Number of children: 0  . Years of education: 12+  . Highest education level: Not on file  Occupational History  . Occupation: Reitred  Tobacco Use  . Smoking status: Former Smoker    Packs/day: 0.25    Years: 15.00    Pack years: 3.75    Types: Cigarettes    Quit date: 02/04/2016    Years since quitting: 3.4  . Smokeless tobacco: Never Used  Substance and Sexual Activity  . Alcohol use: Not Currently    Comment: occasional - tequila shots on holidays  . Drug use: No  . Sexual activity: Not on file  Other Topics Concern  . Not on file  Social History Narrative   Lives alone    Caffeine use: 2 cups coffee per day (mixed decaf/regular)   Social Determinants of Health   Financial Resource Strain:   . Difficulty of Paying Living Expenses: Not on file  Food Insecurity:   . Worried About Charity fundraiser in the Last Year: Not on file  . Ran Out of Food in the Last Year: Not on file  Transportation Needs:   . Lack of Transportation (Medical): Not on file  . Lack of Transportation (Non-Medical): Not on file  Physical Activity:   . Days of Exercise per Week: Not on file  . Minutes of Exercise per Session: Not on file  Stress:   . Feeling of Stress : Not on file  Social Connections:   . Frequency of Communication with Friends and Family: Not on file  . Frequency of Social Gatherings with Friends and Family: Not on file  . Attends Religious Services: Not on file  . Active Member of Clubs or Organizations: Not on file  . Attends Archivist Meetings: Not on file  . Marital Status: Not on file     Family History: The patient's family history includes Alzheimer's  disease in her sister; Breast cancer (age of onset: 26) in her sister; Cancer in her sister; Diabetes in her sister; Esophageal cancer in her brother; Heart disease in her brother, brother, father, mother, sister, and sister; Hyperlipidemia in her sister; Hypertension in her brother and sister. There is no history of Colon cancer.  ROS:   Please see the history of present illness.  LE neuropathy- [facility] by [facility] neurologic   All other systems reviewed and are negative.  EKGs/Labs/Other Studies Reviewed:  The following studies were reviewed today: Echo Dec 2019 Myoview Jan 2020  EKG:  EKG is ordered today.  The ekg ordered today demonstrates NSR- HR 71  Recent Labs: 10/29/2018: B Natriuretic Peptide 50.3 06/28/2019: ALT 12; BUN 13; Creatinine, Ser 0.90; Hemoglobin 12.8; Magnesium 1.9; Platelets 209; Potassium 3.8; Sodium 137  Recent Lipid Panel    Component Value Date/Time   CHOL 129 01/04/2016 0853   TRIG 104 01/04/2016 0853   HDL 51 01/04/2016 0853   CHOLHDL 2.5 01/04/2016 0853   VLDL 21 01/04/2016 0853   LDLCALC 57 01/04/2016 0853    Physical Exam:    VS:  There were no vitals taken for this visit.    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    Chronic diastolic CHF EF 123456 with grade 2 DD in feb 2018  Essential hypertension Under control B/P  Angioedema Admitted with  angioedema secondary to ARB in Aug 2017- required 48 hrs of intubation Subsequent allergy testing confirms angioedema from ARB/ACE also red meat allergy  Coronary artery disease CAD- 60% mLAD Aug 2012. Myoview low risk Jan 2020  Hyperlipidemia On statin therapy.PCP following.  History of smoking- 0.25 ppd for at least 15 years- quit Aug 2017  PLAN:    No change in medical Rx. From a cardiac standpoint she is OK to nasal polyps removed if that ends being required.  I suggested she try the Albuterol inhaler- if she has improvement I would suggest she f/u with Dr Elsworth Soho.   Medication Adjustments/Labs and Tests Ordered: Current medicines are reviewed at length with the patient today.  Concerns regarding medicines are outlined above.  No orders of the defined types were placed in this encounter.  No orders of the defined types were placed in this encounter.   There are no Patient Instructions on file for this visit.   Signed, Kerin Ransom, PA-C  07/30/2019 4:31 PM    [facility]

## 2019-07-30 NOTE — Patient Instructions (Signed)
Medication Instructions:  Your physician recommends that you continue on your current medications as directed. Please refer to the Current Medication list given to you today.  *If you need a refill on your cardiac medications before your next appointment, please call your pharmacy*  Lab Work: NONE  Testing/Procedures: NONE  Follow-Up: At [facility], you and your health needs are our priority.  As part of our continuing mission to provide you with exceptional heart care, we have created designated Provider Care Teams.  These Care Teams include your primary Cardiologist (physician) and Advanced Practice Providers (APPs -  Physician Assistants and Nurse Practitioners) who all work together to provide you with the care you need, when you need it.  Your next appointment:   As planned in June with Dr. Gwenlyn Found

## 2019-08-01 DIAGNOSIS — Z91018 Allergy to other foods: Secondary | ICD-10-CM | POA: Diagnosis not present

## 2019-08-04 DIAGNOSIS — G8314 Monoplegia of lower limb affecting left nondominant side: Secondary | ICD-10-CM | POA: Diagnosis not present

## 2019-08-07 DIAGNOSIS — G8310 Monoplegia of lower limb affecting unspecified side: Secondary | ICD-10-CM | POA: Diagnosis not present

## 2019-08-12 DIAGNOSIS — G8310 Monoplegia of lower limb affecting unspecified side: Secondary | ICD-10-CM | POA: Diagnosis not present

## 2019-08-15 DIAGNOSIS — G8314 Monoplegia of lower limb affecting left nondominant side: Secondary | ICD-10-CM | POA: Diagnosis not present

## 2019-08-26 DIAGNOSIS — G8314 Monoplegia of lower limb affecting left nondominant side: Secondary | ICD-10-CM | POA: Diagnosis not present

## 2019-08-29 DIAGNOSIS — G8314 Monoplegia of lower limb affecting left nondominant side: Secondary | ICD-10-CM | POA: Diagnosis not present

## 2019-09-01 DIAGNOSIS — J3489 Other specified disorders of nose and nasal sinuses: Secondary | ICD-10-CM | POA: Diagnosis not present

## 2019-09-01 DIAGNOSIS — R220 Localized swelling, mass and lump, head: Secondary | ICD-10-CM | POA: Diagnosis not present

## 2019-09-01 DIAGNOSIS — J324 Chronic pansinusitis: Secondary | ICD-10-CM | POA: Diagnosis not present

## 2019-09-01 DIAGNOSIS — J339 Nasal polyp, unspecified: Secondary | ICD-10-CM | POA: Diagnosis not present

## 2019-09-03 ENCOUNTER — Ambulatory Visit: Payer: Medicare HMO | Admitting: Podiatry

## 2019-09-08 ENCOUNTER — Other Ambulatory Visit: Payer: Medicare HMO

## 2019-09-10 ENCOUNTER — Telehealth: Payer: Self-pay | Admitting: *Deleted

## 2019-09-10 NOTE — Telephone Encounter (Signed)
Received notice from [facility]/ [employer] stating pt has been approved various types of therapy with [facility] x 6 visits from 08/29/19 to 09/28/19.

## 2019-09-23 ENCOUNTER — Telehealth: Payer: Self-pay | Admitting: Cardiovascular Disease

## 2019-09-23 NOTE — Telephone Encounter (Signed)
Patient reports 150-160's consistently in the mornings, but can run as low as 105 in the late afternoons.  She notes that readings < 110 make her feel "like a dishrag".  When this happens she will just take 1/2 of the hydralazine and carvedilol 6 pm doses.    Currently takes carvedilol 25 mg bid (9am, 6pm)   Hydralazine 50 mg (9am, 6pm, midnight)  Advised that when she feels this way she should take her full dose of carvedilol and skip the 6 pm hydralazine.   Patient voiced understanding.

## 2019-09-23 NOTE — Telephone Encounter (Signed)
Patient reports BP has been dropped to around 123XX123 systolic after taking her medications.  Higher BP upon waking and then drops throughout course of the day She reports initial BP of 123456 systolic, [id number] after taking meds, and then 105 a while later (this was yesterday, for example) She has no other readings to provide but reports this pattern for about 1 week Carvedilol was increased Dec 2020 and hydralazine on a separate day in December.   She takes coreg, imdur, hydralazine in the morning - hydralazine mid-day - coreg, hydralazine @ 6pm  Will route to [facility] pharmacist to advise if any dose changes need to be made

## 2019-09-23 NOTE — Telephone Encounter (Signed)
Pt c/o BP issue: STAT if pt c/o blurred vision, one-sided weakness or slurred speech  1. What are your last 5 BP readings? Did not save records for BP.   2. Are you having any other symptoms (ex. Dizziness, headache, blurred vision, passed out)? Just really drowsy and SOB when BP drops too low.   3. What is your BP issue? Patient gave an estimate: in the AM it is 154/72, then she takes her medication and it drops to 124/77 then after taking her medication for the third time it goes lower to like 105/?? She states that carvedilol (COREG) 25 MG tablet and hydrALAZINE (APRESOLINE) 50 MG tablet were both increased.

## 2019-09-25 ENCOUNTER — Encounter: Payer: Self-pay | Admitting: Podiatry

## 2019-09-25 ENCOUNTER — Ambulatory Visit (INDEPENDENT_AMBULATORY_CARE_PROVIDER_SITE_OTHER): Payer: Medicare HMO

## 2019-09-25 ENCOUNTER — Ambulatory Visit: Payer: Medicare HMO | Admitting: Podiatry

## 2019-09-25 ENCOUNTER — Other Ambulatory Visit: Payer: Self-pay | Admitting: Podiatry

## 2019-09-25 ENCOUNTER — Other Ambulatory Visit: Payer: Self-pay

## 2019-09-25 VITALS — Temp 96.7°F

## 2019-09-25 DIAGNOSIS — L600 Ingrowing nail: Secondary | ICD-10-CM | POA: Diagnosis not present

## 2019-09-25 DIAGNOSIS — M2022 Hallux rigidus, left foot: Secondary | ICD-10-CM | POA: Diagnosis not present

## 2019-09-25 DIAGNOSIS — M21619 Bunion of unspecified foot: Secondary | ICD-10-CM

## 2019-09-25 DIAGNOSIS — M2021 Hallux rigidus, right foot: Secondary | ICD-10-CM

## 2019-09-25 DIAGNOSIS — M779 Enthesopathy, unspecified: Secondary | ICD-10-CM

## 2019-09-25 MED ORDER — NEOMYCIN-POLYMYXIN-HC 3.5-10000-1 OP SUSP: OPHTHALMIC | 1 refills | Status: DC

## 2019-09-25 NOTE — Patient Instructions (Signed)

## 2019-09-25 NOTE — Progress Notes (Signed)
Subjective:   Patient ID: Megan Hull, female   DOB: 79 y.o.   MRN: [id number]   HPI Patient presents complaining of pain in the big toe joint left but states mostly it is the bone that presses against her shoe that causes the problem and on the right big toe the medial border is painful when pressed and states that she is tried to trim and soak it without relief of symptoms.  Patient does not smoke likes to be active   Review of Systems  All other systems reviewed and are negative.       Objective:  Physical Exam Vitals and nursing note reviewed.  Constitutional:      Appearance: She is well-developed.  Pulmonary:     Effort: Pulmonary effort is normal.  Musculoskeletal:        General: Normal range of motion.  Skin:    General: Skin is warm.  Neurological:     Mental Status: She is alert.     Neurovascular status intact muscle strength was found to be adequate range of motion within normal limits.  Patient is found to have incurvated right hallux medial border that is painful when pressed with irritation of the corner and left there is prominence of the first metatarsal head with irritation of bone loss of motion and mild discomfort within the joint surface.  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  For the left I did discuss this and I tried to inject the joint 3 mg Dexasone Kenalog 5 g Xylocaine to  reduce inflammation and I did discuss possible bone spur resection if it remains painful on the spur  X-rays indicate significant spurring of the left first MPJ with flattening of the joint and multiple signs of arthritis

## 2019-09-30 DIAGNOSIS — R1084 Generalized abdominal pain: Secondary | ICD-10-CM | POA: Diagnosis not present

## 2019-09-30 DIAGNOSIS — I509 Heart failure, unspecified: Secondary | ICD-10-CM | POA: Diagnosis not present

## 2019-09-30 DIAGNOSIS — R109 Unspecified abdominal pain: Secondary | ICD-10-CM | POA: Diagnosis not present

## 2019-09-30 DIAGNOSIS — Z79899 Other long term (current) drug therapy: Secondary | ICD-10-CM | POA: Diagnosis not present

## 2019-09-30 DIAGNOSIS — Z7982 Long term (current) use of aspirin: Secondary | ICD-10-CM | POA: Diagnosis not present

## 2019-09-30 DIAGNOSIS — R0602 Shortness of breath: Secondary | ICD-10-CM | POA: Diagnosis not present

## 2019-09-30 DIAGNOSIS — Z20822 Contact with and (suspected) exposure to covid-19: Secondary | ICD-10-CM | POA: Diagnosis not present

## 2019-09-30 DIAGNOSIS — E785 Hyperlipidemia, unspecified: Secondary | ICD-10-CM | POA: Diagnosis not present

## 2019-09-30 DIAGNOSIS — M7989 Other specified soft tissue disorders: Secondary | ICD-10-CM | POA: Diagnosis not present

## 2019-09-30 DIAGNOSIS — I110 Hypertensive heart disease with heart failure: Secondary | ICD-10-CM | POA: Diagnosis not present

## 2019-10-01 DIAGNOSIS — R1084 Generalized abdominal pain: Secondary | ICD-10-CM | POA: Diagnosis not present

## 2019-10-09 DIAGNOSIS — Z20822 Contact with and (suspected) exposure to covid-19: Secondary | ICD-10-CM | POA: Diagnosis not present

## 2019-10-27 ENCOUNTER — Ambulatory Visit: Payer: Medicare HMO | Admitting: Podiatry

## 2019-10-28 ENCOUNTER — Other Ambulatory Visit: Payer: Self-pay | Admitting: Cardiovascular Disease

## 2019-11-03 DIAGNOSIS — J339 Nasal polyp, unspecified: Secondary | ICD-10-CM | POA: Diagnosis not present

## 2019-11-03 DIAGNOSIS — J324 Chronic pansinusitis: Secondary | ICD-10-CM | POA: Diagnosis not present

## 2019-11-21 DIAGNOSIS — J324 Chronic pansinusitis: Secondary | ICD-10-CM | POA: Diagnosis not present

## 2019-11-21 DIAGNOSIS — J339 Nasal polyp, unspecified: Secondary | ICD-10-CM | POA: Insufficient documentation

## 2019-11-24 ENCOUNTER — Other Ambulatory Visit: Payer: Self-pay | Admitting: *Deleted

## 2019-11-24 DIAGNOSIS — E785 Hyperlipidemia, unspecified: Secondary | ICD-10-CM

## 2019-11-24 DIAGNOSIS — R0600 Dyspnea, unspecified: Secondary | ICD-10-CM

## 2019-11-24 DIAGNOSIS — R0609 Other forms of dyspnea: Secondary | ICD-10-CM

## 2019-11-24 MED ORDER — HYDRALAZINE HCL 50 MG PO TABS: 50.0000 mg | ORAL_TABLET | Freq: Three times a day (TID) | ORAL | 0 refills | Status: DC

## 2019-11-24 MED ORDER — CARVEDILOL 25 MG PO TABS: 25.0000 mg | ORAL_TABLET | Freq: Two times a day (BID) | ORAL | 0 refills | Status: DC

## 2019-11-25 ENCOUNTER — Telehealth: Payer: Self-pay | Admitting: Cardiovascular Disease

## 2019-11-25 NOTE — Telephone Encounter (Signed)
Spoke with patient. Patient reports she has been feeling dizzy and has had low blood pressures. She reports the BP has been about 117/72 or 124/72. Advised blood pressure is within normal limits. Patient did mention she had been taking carvedilol 3x a day instead of twice. Reviewed hydralazine and carvedilol instructions with patient. Per 4/13 note from [place] patient may skip the 6p Hydralazine dose if she "feels like a dishrag."  Patient takes medications at [place], 5/6p and 11p. Patient is going to take her carvedilol at 10a and 11p instead of 10a and 5p. She will hold 5p Hydralazine if needed.   She will call back if she continues to have dizziness or "low blood pressures" after making these adjustments.

## 2019-11-25 NOTE — Telephone Encounter (Signed)
New Message    Pt c/o medication issue:  1. Name of Medication: hydrALAZINE (APRESOLINE) 50 MG tablet  carvedilol (COREG) 25 MG tablet    2. How are you currently taking this medication (dosage and times per day)?  Hydralazine 1 tablet 3 x daily  Carvedilol 1 tablet 2 x daily   3. Are you having a reaction (difficulty breathing--STAT)? No   4. What is your medication issue? Pt says they increased her medications and now she has been experiencing low BP readings and she feels dizzy Pt says she is not having symptoms now but would like to discuss

## 2019-11-27 ENCOUNTER — Other Ambulatory Visit: Payer: Self-pay

## 2019-11-27 MED ORDER — CARVEDILOL 25 MG PO TABS: 25.0000 mg | ORAL_TABLET | Freq: Two times a day (BID) | ORAL | 2 refills | Status: DC

## 2019-11-28 ENCOUNTER — Other Ambulatory Visit: Payer: Self-pay

## 2019-12-02 ENCOUNTER — Other Ambulatory Visit: Payer: Self-pay

## 2019-12-02 NOTE — Progress Notes (Signed)
Cardiology Clinic Note   Patient Name: SHANIYA TASHIRO Date of Encounter: 12/03/2019  Primary Care Provider:  [name], DO Primary Cardiologist:  Quay Burow, MD  Patient Profile    Wendall Mola. Lap[place] 79 year old female presents to the clinic today for follow-up evaluation of her CAD, hypertension, and lower extremity edema.  Past Medical History    Past Medical History:  Diagnosis Date   Arthritis    CHF (congestive heart failure) (HCC)    Diabetes mellitus without complication (HCC)    borderline   Diarrhea    Diverticulosis    Dysrhythmia    controlled with Metoprolol-12-19-13 LOV -Dr. Gwenlyn Found sees yearly   Family history of premature CAD    GERD (gastroesophageal reflux disease)    Heart murmur    yrs ago   HLD (hyperlipidemia)    pt reports cholesterol normal now (07/21/19).   Hypertension    Lower extremity edema    Status post dilation of esophageal narrowing    Supraventricular arrhythmia    Tobacco abuse    Past Surgical History:  Procedure Laterality Date   ANTERIOR CERVICAL DECOMP/DISCECTOMY FUSION N/A 06/08/2015   Procedure: CERVICAL FIVE-SIX ,CERVICAL SIX-SEVEN ANTERIOR CERVICAL DECOMPRESSION/DISCECTOMY FUSION PLATING BONEGRAFT;  Surgeon: Kristeen Miss, MD;  Location: [facility];  Service: Neurosurgery;  Laterality: N/A;   CARDIAC CATHETERIZATION  01/15/2009   non-critical CAD, 60% mid LAD lesion (Dr. Adora Fridge)   CATARACT EXTRACTION, BILATERAL     most recent 11/19   COLONOSCOPY     ESOPHAGEAL DILATION  2014   infected lymph node surgery  80's   NM MYOCAR PERF WALL MOTION  04/2011   bruce myoview; normal pattern of perfusion, EF 80%, low risk scan   TONSILLECTOMY  70's   TRANSTHORACIC ECHOCARDIOGRAM  12/2007   mild conc LVH    Allergies  Allergies  Allergen Reactions   Nexium [Esomeprazole Magnesium] Anaphylaxis    Pt states she was unable to swallow for several hours after taking the nexium   Other Hives,  Shortness Of Breath and Other (See Comments)    Red Fish (Hives) Bojangle's chicken (swelling and shortness of breath) PAPRIKA   Codeine Other (See Comments)    REACTION: chest pain   Ace Inhibitors Swelling    Angioedema    Latex Other (See Comments)    Causes skin to turn red per patient    Monosodium Glutamate Other (See Comments)    dizziness   Amlodipine Other (See Comments)    Lower extremity swelling, itching   Shellfish Allergy Hives   Tape Rash    Coban wrap turned her arm red (after stress test)    History of Present Illness    Ms. 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  She was last seen by Kerin Ransom, PA-C on 07/30/2019.  During that time it was noted that she had presented to the emergency department 12/19 with symptoms of substernal chest pain.  She was noted to have an episode of SVT while sitting on her couch watching football.  The SVT was broken with a Valsalva maneuver, and was followed by palpitations and SS CP.  It was noted that she had previously had palpitations and ectopy however had not had any history of SVT.  She is a retired Programmer, multimedia at [employer]  and has some medical and cardiology knowledge.  She indicated that she can tell when her blood pressure is elevated due to left neck pain.  She was seen by Dr. Gwenlyn Found 12/20.  An echocardiogram 12/19 and nuclear stress test 1/20 after her complaints of neck pain and dyspnea on exertion showed low risk.  Her echocardiogram showed an LVEF of 60-65%, mild LVH and G1 DD.  She was also evaluated by pulmonary and was not felt to have contributed COPD.  When she last presented to the clinic on 07/30/2019 she brought blood pressure readings that showed  consistent elevated systolic blood pressure 158-309 systolic.  She brought her blood pressure monitor from home which showed an increase in systolic pressure by around 40 points.  Her blood pressure in the office was 120s over 60s.  She was grateful to discover this.  She was doing well from a cardiac standpoint.  She continued to have chronic dyspnea on exertion she had been prescribed albuterol however had not used the medication.  She had not had any recurrent episodes of PSVT.  She presents the clinic today for follow-up evaluation and states she feels well.  She states that she has had episodes where her blood pressure is in the 115's over 60s and 70s and she has taken her hydralazine which further drops down her blood pressure and makes her feel not well.  She requests that she have 25 mg tablets to be able to take in the evening when her blood pressure is lower.  She continues to be very physically active in her church and follows a heart healthy diet.  She brings a blood pressure log today that shows well-controlled blood pressure.  She states that she has an appointment with Dr. Gwenlyn Found in August for clearance for her sinus surgery.  I will give her a prescription for an additional 25 mg of hydralazine, give her the salty 6 diet sheet, and have her follow-up as scheduled.  Today she denies chest pain, shortness of breath, lower extremity edema, fatigue, melena, hematuria, hemoptysis, diaphoresis, weakness, presyncope, syncope, orthopnea, and PND.   Home Medications    Prior to Admission medications   Medication Sig Start Date End Date Taking? Authorizing Provider  albuterol (VENTOLIN HFA) 108 (90 Base) MCG/ACT inhaler  07/21/19   [provider]  Alum & Mag Hydroxide-Simeth (ANTACID ADVANCED PO) Take 1 tablet by mouth daily as needed (indigestion).     [provider]  aspirin 81 MG tablet Take 1 tablet (81 mg total) by mouth daily. 03/12/18   Lorretta Harp, MD  budesonide  (PULMICORT) 0.25 MG/2ML nebulizer solution Apply 2 mL to your nasal saline irrigation once per day. 09/01/19   [provider]  carvedilol (COREG) 25 MG tablet Take 1 tablet (25 mg total) by mouth 2 (two) times daily with a meal. Patient needs to schedule 6 month follow up for further refills 11/27/19   Lorretta Harp, MD  cetirizine (ZYRTEC) 10 MG tablet Take 10 mg by mouth daily.     [provider]  Cholecalciferol (VITAMIN D3 PO) Take 50 mg by mouth daily.    [provider]  clindamycin (CLEOCIN) 150 MG capsule  07/21/19   [provider]  diphenhydrAMINE (BENADRYL ALLERGY) 25 MG tablet Take 2 tablets by mouth as needed for anaphylaxis.    [provider]  EPINEPHrine 0.3 mg/0.3 mL IJ SOAJ injection Inject 0.3 mLs (0.3 mg total) into the muscle once as needed (anaphylaxis). 12/11/15  Patrecia Pour, MD  fluticasone Asencion Islam) 50 MCG/ACT nasal spray  03/10/19   [provider]  furosemide (LASIX) 40 MG tablet TAKE 1 TABLET IN THE MORNING AND TAKE 1/2 TABLET IN THE EVENING ( TAKE 1 AND 1/2 TABLETS EVERY DAY ) 01/23/19   Lorretta Harp, MD  hydrALAZINE (APRESOLINE) 50 MG tablet Take 1 tablet (50 mg total) by mouth 3 (three) times daily. Patient needs to schedule 6 month follow up for further refills 11/24/19   Troy Sine, MD  isosorbide mononitrate (IMDUR) 30 MG 24 hr tablet TAKE 1 TABLET EVERY DAY 10/28/19   Lorretta Harp, MD  levofloxacin (LEVAQUIN) 500 MG tablet Take 500 mg by mouth daily. 05/16/19   [provider]  montelukast (SINGULAIR) 10 MG tablet Take 10 mg by mouth at bedtime.    [provider]  Multiple Vitamins-Minerals (AIRBORNE PO) Take 4 tablets by mouth daily.    [provider]  neomycin-polymyxin-hydrocortisone (CORTISPORIN) 3.5-10000-1 ophthalmic suspension Apply 1-2 drops to the affected toes BID after soaking 09/25/19   Wallene Huh, DPM  Probiotic Product (PROBIOTIC DAILY PO) Take by mouth.     [provider]  simvastatin (ZOCOR) 40 MG tablet Take 1 tablet (40 mg total) by mouth daily. 03/12/19   Lorretta Harp, MD  vitamin C (ASCORBIC ACID) 500 MG tablet Take 500 mg by mouth daily.    [provider]    Family History    Family History  Problem Relation Age of Onset   Heart disease Brother        MI @ 3, HTN @ 48, DM @ 13    Heart disease Father        MI   Heart disease Mother        MI, CVA @ 91   Heart disease Sister        CHF, CVA   Breast cancer Sister 80   Esophageal cancer Brother    Alzheimer's disease Sister    Heart disease Brother        MI in 57s, lung cancer   Heart disease Sister    Hypertension Sister        x2   Hyperlipidemia Sister        x2   Diabetes Sister        x2   Cancer Sister    Hypertension Brother    Colon cancer Neg Hx    She indicated that her mother is deceased. She indicated that her father is deceased. She indicated that two of her ten sisters are alive. She indicated that only one of her six brothers is alive. She indicated that her maternal grandmother is deceased. She indicated that her maternal grandfather is deceased. She indicated that her paternal grandmother is deceased. She indicated that her paternal grandfather is deceased. She indicated that the status of her neg hx is unknown.  Social History    Social History   Socioeconomic History   Marital status: Single    Spouse name: Not on file   Number of children: 0   Years of education: 12+   Highest education level: Not on file  Occupational History   Occupation: Reitred  Tobacco Use   Smoking status: Former Smoker    Packs/day: 0.25    Years: 15.00    Pack years: 3.75    Types: Cigarettes    Quit date: 02/04/2016    Years since quitting: 3.8   Smokeless tobacco: Never  Used  Vaping Use   Vaping Use: Never used  Substance and Sexual Activity   Alcohol use: Not Currently    Comment: occasional - tequila shots  on holidays   Drug use: No   Sexual activity: Not on file  Other Topics Concern   Not on file  Social History Narrative   Lives alone    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)   Social Determinants of Health   Financial Resource Strain:    Difficulty of Paying Living Expenses:   Food Insecurity:    Worried About Charity fundraiser in the Last Year:    Arboriculturist in the Last Year:   Transportation Needs:    Film/video editor (Medical):    Lack of Transportation (Non-Medical):   Physical Activity:    Days of Exercise per Week:    Minutes of Exercise per Session:   Stress:    Feeling of Stress :   Social Connections:    Frequency of Communication with Friends and Family:    Frequency of Social Gatherings with Friends and Family:    Attends Religious Services:    Active Member of Clubs or Organizations:    Attends Music therapist:    Marital Status:   Intimate Partner Violence:    Fear of Current or Ex-Partner:    Emotionally Abused:    Physically Abused:    Sexually Abused:      Review of Systems    General:  No chills, fever, night sweats or weight changes.  Cardiovascular:  No chest pain, dyspnea on exertion, edema, orthopnea, palpitations, paroxysmal nocturnal dyspnea. Dermatological: No rash, lesions/masses Respiratory: No cough, dyspnea Urologic: No hematuria, dysuria Abdominal:   No nausea, vomiting, diarrhea, bright red blood per rectum, melena, or hematemesis Neurologic:  No visual changes, wkns, changes in mental status. All other systems reviewed and are otherwise negative except as noted above.  Physical Exam    VS:  BP 134/70    Pulse 72    Ht 5\' 5"  (1.651 m)    Wt 168 lb 12.8 oz (76.6 kg)    SpO2 95%    BMI 28.09 kg/m  , BMI Body mass index is 28.09 kg/m. GEN: Well nourished, well developed, in no acute distress. HEENT: normal. Neck: Supple, no JVD, carotid bruits, or masses. Cardiac:  RRR, no murmurs, rubs, or gallops. No clubbing, cyanosis, edema.  Radials/DP/PT 2+ and equal bilaterally.  Respiratory:  Respirations regular and unlabored, clear to auscultation bilaterally. GI: Soft, nontender, nondistended, BS + x 4. MS: no deformity or atrophy. Skin: warm and dry, no rash. Neuro:  Strength and sensation are intact. Psych: Normal affect.  Accessory Clinical Findings    ECG personally reviewed by me today-none today. EKG 07/30/2019 Normal sinus rhythm 71 bpm  Echocardiogram 06/06/2018 Study Conclusions   - Left ventricle: The cavity size was normal. There was mild  concentric hypertrophy. Systolic function was normal. The  estimated ejection fraction was in the range of 60% to 65%. Wall  motion was normal; there were no regional wall motion  abnormalities. Doppler parameters are consistent with abnormal  left ventricular relaxation (grade 1 diastolic dysfunction).  - Mitral valve: Calcified annulus. Mildly thickened leaflets .  - Atrial septum: No defect or patent foramen ovale was identified.  Nuclear stress test 06/14/2018  Nuclear stress EF: 73%.  The left ventricular ejection fraction is hyperdynamic (>65%).  There was no ST segment deviation noted during  stress.  The study is normal.  This is a low risk study.   Normal stress nuclear study with no ischemia or infarction.  Gated ejection fraction 73% with normal wall motion.   Assessment & Plan   1.  Paroxysmal SVT-no palpitations today.  Heart rate 74.  Occasional  episodes of palpitations or heart flutter. Continue carvedilol Heart healthy low-sodium diet Avoid triggers caffeine, chocolate, EtOH etc.  Chronic diastolic CHF-euvolemic today.  No increased work of breathing or increased DOE.  Nuclear stress test shows EF of 73 on 06/14/2018. Continue furosemide, carvedilol, Imdur, hydralazine Heart healthy low-sodium diet-salty 6 given Increase physical activity as tolerated  Essential  hypertension-BP today 134/70.  Well-controlled at home Continue Imdur, hydralazine, carvedilol, furosemide Heart healthy low-sodium diet-salty 6 given Increase physical activity as tolerated New prescription given for hydralazine 25 mg, this is more convenient for than splitting 50 mg tablets.  Coronary artery disease-no chest pain today.  Cardiac catheterization 8/12 showed 60% mid LAD stenosis.  Nuclear stress test 1/20 showed low risk and no ischemia. 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  This episode required 48 hours of intubation.  Allergy testing subsequently showed allergies to ARB/ACE and red meat.  Disposition: Follow-up with Dr. Gwenlyn Found in 2 months.  Jossie Ng. Merton Wadlow NP-C    12/03/2019, 2:45 PM [facility] [address] Office [phone] Fax [phone]

## 2019-12-03 ENCOUNTER — Other Ambulatory Visit: Payer: Self-pay

## 2019-12-03 ENCOUNTER — Ambulatory Visit: Payer: Medicare HMO | Admitting: General Practice

## 2019-12-03 ENCOUNTER — Encounter: Payer: Self-pay | Admitting: General Practice

## 2019-12-03 VITALS — BP 134/70 | HR 72 | Ht 65.0 in | Wt 168.8 lb

## 2019-12-03 DIAGNOSIS — I10 Essential (primary) hypertension: Secondary | ICD-10-CM

## 2019-12-03 DIAGNOSIS — I2510 Atherosclerotic heart disease of native coronary artery without angina pectoris: Secondary | ICD-10-CM | POA: Diagnosis not present

## 2019-12-03 DIAGNOSIS — I471 Supraventricular tachycardia: Secondary | ICD-10-CM

## 2019-12-03 DIAGNOSIS — R0609 Other forms of dyspnea: Secondary | ICD-10-CM

## 2019-12-03 DIAGNOSIS — R0600 Dyspnea, unspecified: Secondary | ICD-10-CM | POA: Diagnosis not present

## 2019-12-03 DIAGNOSIS — E785 Hyperlipidemia, unspecified: Secondary | ICD-10-CM

## 2019-12-03 DIAGNOSIS — I5032 Chronic diastolic (congestive) heart failure: Secondary | ICD-10-CM

## 2019-12-03 MED ORDER — HYDRALAZINE HCL 25 MG PO TABS: 50.0000 mg | ORAL_TABLET | Freq: Three times a day (TID) | ORAL | 3 refills | Status: DC | PRN

## 2019-12-03 MED ORDER — HYDRALAZINE HCL 25 MG PO TABS: 25.0000 mg | ORAL_TABLET | Freq: Three times a day (TID) | ORAL | 1 refills | Status: DC | PRN

## 2019-12-03 NOTE — Patient Instructions (Signed)
Medication Instructions:  TAKE HYDRALAZINE 50MG  DAILY-MAY HOLD IF BLOOD PRESSURE <115-120 *If you need a refill on your cardiac medications before your next appointment, please call your pharmacy*  Special Instructions PLEASE READ AND FOLLOW SALTY 6-ATTACHED  Follow-Up: Your next appointment:  [place] Person with Quay Burow, MD  At [facility], you and your health needs are our priority.  As part of our continuing mission to provide you with exceptional heart care, we have created designated Provider Care Teams.  These Care Teams include your primary Cardiologist (physician) and Advanced Practice Providers (APPs -  Physician Assistants and Nurse Practitioners) who all work together to provide you with the care you need, when you need it.

## 2019-12-04 ENCOUNTER — Other Ambulatory Visit: Payer: Self-pay

## 2020-01-02 NOTE — Telephone Encounter (Signed)
Did not need this encounter °

## 2020-01-28 DIAGNOSIS — E785 Hyperlipidemia, unspecified: Secondary | ICD-10-CM | POA: Diagnosis not present

## 2020-01-28 DIAGNOSIS — E119 Type 2 diabetes mellitus without complications: Secondary | ICD-10-CM | POA: Diagnosis not present

## 2020-01-29 ENCOUNTER — Other Ambulatory Visit: Payer: Self-pay | Admitting: Cardiovascular Disease

## 2020-01-30 ENCOUNTER — Other Ambulatory Visit: Payer: Self-pay | Admitting: Otolaryngology

## 2020-02-03 DIAGNOSIS — R0982 Postnasal drip: Secondary | ICD-10-CM | POA: Diagnosis not present

## 2020-02-03 DIAGNOSIS — M47816 Spondylosis without myelopathy or radiculopathy, lumbar region: Secondary | ICD-10-CM | POA: Diagnosis not present

## 2020-02-03 DIAGNOSIS — E7800 Pure hypercholesterolemia, unspecified: Secondary | ICD-10-CM | POA: Diagnosis not present

## 2020-02-03 DIAGNOSIS — I471 Supraventricular tachycardia: Secondary | ICD-10-CM | POA: Diagnosis not present

## 2020-02-03 DIAGNOSIS — J329 Chronic sinusitis, unspecified: Secondary | ICD-10-CM | POA: Diagnosis not present

## 2020-02-03 DIAGNOSIS — E119 Type 2 diabetes mellitus without complications: Secondary | ICD-10-CM | POA: Diagnosis not present

## 2020-02-10 ENCOUNTER — Encounter: Payer: Self-pay | Admitting: Cardiovascular Disease

## 2020-02-10 ENCOUNTER — Ambulatory Visit: Payer: Medicare HMO | Admitting: Cardiovascular Disease

## 2020-02-10 ENCOUNTER — Other Ambulatory Visit: Payer: Self-pay

## 2020-02-10 DIAGNOSIS — E785 Hyperlipidemia, unspecified: Secondary | ICD-10-CM

## 2020-02-10 DIAGNOSIS — I2510 Atherosclerotic heart disease of native coronary artery without angina pectoris: Secondary | ICD-10-CM | POA: Diagnosis not present

## 2020-02-10 DIAGNOSIS — I10 Essential (primary) hypertension: Secondary | ICD-10-CM | POA: Diagnosis not present

## 2020-02-10 NOTE — Assessment & Plan Note (Signed)
History of essential hypertension a blood pressure measured today 150/72.  She is on carvedilol and hydralazine.

## 2020-02-10 NOTE — Assessment & Plan Note (Signed)
History of dyslipidemia on statin therapy with lipid profile performed 01/28/2020 revealing total cholesterol 116, LDL 55 and HDL 47.

## 2020-02-10 NOTE — Assessment & Plan Note (Signed)
History of CAD status post cardiac cath 01/15/2011 because of jaw pain which is her anginal equivalent that revealed a 60% mid LAD lesion.  She had anterior takeoff RCA.  She has been asymptomatic since.  Myoview stress test performed 06/14/2018 was nonischemic.  She denies chest pain.

## 2020-02-10 NOTE — Patient Instructions (Signed)
Medication Instructions:  The current medical regimen is effective;  continue present plan and medications.  *If you need a refill on your cardiac medications before your next appointment, please call your pharmacy*    Follow-Up: At [facility], you and your health needs are our priority.  As part of our continuing mission to provide you with exceptional heart care, we have created designated Provider Care Teams.  These Care Teams include your primary Cardiologist (physician) and Advanced Practice Providers (APPs -  Physician Assistants and Nurse Practitioners) who all work together to provide you with the care you need, when you need it.  We recommend signing up for the patient portal called "MyChart".  Sign up information is provided on this After Visit Summary.  MyChart is used to connect with patients for Virtual Visits (Telemedicine).  Patients are able to view lab/test results, encounter notes, upcoming appointments, etc.  Non-urgent messages can be sent to your provider as well.   To learn more about what you can do with MyChart, go to [email or web address].    Your next appointment:   6 months with Coletta Memos, NP 12 months with Dr.Berry

## 2020-02-10 NOTE — Progress Notes (Signed)
02/10/2020 Megan Hull   [birth year]  [id number]  Primary Physician Megan Morning, DO Primary Cardiologist: Megan Harp MD FACP, Megan Hull, Megan Hull, Megan Hull  HPI:  Megan Hull is a 79 y.o.  mildly overweight, single [name] with no children whom I last saw  05/20/2019... Her problems include hypertension, hyperlipidemia, continued tobacco abuse of one-third pack per day, and family history of premature heart disease. She had a negative stress test on December 24, 2007. She was admitted to the [facility] on January 15, 2011, with jaw pain, thought to be an anginal equivalent. I catheterized her, revealing noncritical CAD with a 60% mid LAD lesion. She had anterior takeoff RCA. She has been asymptomatic since. She had carotid Dopplers performed at Dr. Roland Hull office which apparently were unremarkable. A lipid profile performed in March revealed total cholesterol of 142, LDL of 76, and HDL of 47. Her major complaint is of lower extremity edema, which gets worse during the day. I had done venous reflux studies on her back in November which were normal.since I saw her last a year and a half ago she has noticed increasing lower extremity edema which is dependent as well as increasing dyspnea on exertion. She denies chest pain. Her weight is stable same. She does admit to dietary indiscretion with regard to salt. She empirically increased her diuretic therapy. This resulted in improvement in her edema. She had recently come back from spending 5 weeks in [place] when I saw her last in July of last year and was anticipating going to [place]. I did do a Myoview stress test on her because of chest pain 01/12/16 which was normal. She developed an angioedema in August of last year probably related to her ARB. She didn't require intubation for airway protection. She had no allergy testing and ARB allergy was confirmed. She was begun on amlodipine which resulted in increasing lower  extremity edema, fluid retention and dyspnea. Her amlodipine was discontinued and she was begun on hydralazine. She returns today with complaints of increasing dyspnea on exertion and lower extremity edema. Her PCP recently increased her diuretic which resulted in some improvement in her edema.Since I saw her back 6 months ago she continues to complain of dyspnea on exertion which is progressive.  She saw  Megan Hull in the office 05/30/2018 complaining some neck pain and shortness of breath. A Myoview stress test was ordered which was completely normal and a 2D echo revealed normal LV systolic function with grade 1 diastolic dysfunction.    She was seen in the emergency room by Dr. Christy Hull on 05/16/2019 with tachypalpitations which began at night while she was watching TV.  She she thought that she was in bigeminy or had irregular heart rhythm.  She tried a Valsalva maneuver which did not work.  When she arrived to the emergency room she was in sinus rhythm.  She does admit to using decongestants but no pseudoephedrine.  She denies chest pain or shortness of breath.  Since I saw her 8 months ago she continues to do well.  She does complain of allergies and sinus drainage drainage and apparently needs a sinus polyp removed surgically at [facility] which I think she can undergo safely.  This is a low risk outpatient procedure.  Her lipid profile is excellent.  Her blood pressure is under good control.   Current Meds  Medication Sig  . albuterol (VENTOLIN HFA) 108 (90 Base) MCG/ACT inhaler   .  Alum & Mag Hydroxide-Simeth (ANTACID ADVANCED PO) Take 1 tablet by mouth daily as needed (indigestion).   [name] aspirin 81 MG tablet Take 1 tablet (81 mg total) by mouth daily.  . budesonide (PULMICORT) 0.25 MG/2ML nebulizer solution Apply 2 mL to your nasal saline irrigation once per day.  . carvedilol (COREG) 25 MG tablet Take 1 tablet (25 mg total) by mouth 2 (two) times daily with a  meal. Patient needs to schedule 6 month follow up for further refills  . cetirizine (ZYRTEC) 10 MG tablet Take 10 mg by mouth daily.   . Cholecalciferol (VITAMIN D3 PO) Take 50 mg by mouth daily.  . clindamycin (CLEOCIN) 150 MG capsule   . diphenhydrAMINE (BENADRYL ALLERGY) 25 MG tablet Take 2 tablets by mouth as needed for anaphylaxis.  [name] EPINEPHrine 0.3 mg/0.3 mL IJ SOAJ injection Inject 0.3 mLs (0.3 mg total) into the muscle once as needed (anaphylaxis).  . fluticasone (FLONASE) 50 MCG/ACT nasal spray   . furosemide (LASIX) 40 MG tablet TAKE 1 TABLET IN THE Hull AND TAKE 1/2 TABLET IN THE EVENING ( TAKE 1 AND 1/2 TABLETS EVERY DAY )  . hydrALAZINE (APRESOLINE) 25 MG tablet Take 1 tablet (25 mg total) by mouth 3 (three) times daily as needed (FOR SBP>150).  . isosorbide mononitrate (IMDUR) 30 MG 24 hr tablet TAKE 1 TABLET EVERY DAY  . levofloxacin (LEVAQUIN) 500 MG tablet Take 500 mg by mouth daily.  . montelukast (SINGULAIR) 10 MG tablet Take 10 mg by mouth at bedtime.  . Multiple Vitamins-Minerals (AIRBORNE PO) Take 4 tablets by mouth daily.  [name] neomycin-polymyxin-hydrocortisone (CORTISPORIN) 3.5-10000-1 ophthalmic suspension Apply 1-2 drops to the affected toes BID after soaking  . Probiotic Product (PROBIOTIC DAILY PO) Take by mouth.  . simvastatin (ZOCOR) 40 MG tablet Take 1 tablet (40 mg total) by mouth daily.  . vitamin C (ASCORBIC ACID) 500 MG tablet Take 500 mg by mouth daily.     Allergies  Allergen Reactions  . Nexium [Esomeprazole Magnesium] Anaphylaxis    Pt states she was unable to swallow for several hours after taking the nexium  . Other Hives, Shortness Of Breath and Other (See Comments)    Red Fish (Hives) Bojangle's chicken (swelling and shortness of breath) PAPRIKA  . Codeine Other (See Comments)    REACTION: chest pain  . Ace Inhibitors Swelling    Angioedema   . Latex Other (See Comments)    Causes skin to turn red per patient   . Monosodium Glutamate Other  (See Comments)    dizziness  . Amlodipine Other (See Comments)    Lower extremity swelling, itching  . Shellfish Allergy Hives  . Tape Rash    Coban wrap turned her arm red (after stress test)    Social History   Socioeconomic History  . Marital status: Single    Spouse name: Not on file  . Number of children: 0  . Years of education: 12+  . Highest education level: Not on file  Occupational History  . Occupation: [employer]  Tobacco Use  . Smoking status: Former Smoker    Packs/day: 0.25    Years: 15.00    Pack years: 3.75    Types: Cigarettes    Quit date: 02/04/2016    Years since quitting: 4.0  . Smokeless tobacco: Never Used  Vaping Use  . Vaping Use: Never used  Substance and Sexual Activity  . Alcohol use: Not Currently    Comment: occasional - tequila shots on holidays  .  Drug use: No  . Sexual activity: Not on file  Other Topics Concern  . Not on file  Social History Narrative   Lives alone    Caffeine use: 2 cups coffee per day (mixed decaf/regular)   Social Determinants of Health   Financial Resource Strain:   . Difficulty of Paying Living Expenses: Not on file  Food Insecurity:   . Worried About Charity fundraiser in the Last Year: Not on file  . Ran Out of Food in the Last Year: Not on file  Transportation Needs:   . Lack of Transportation (Medical): Not on file  . Lack of Transportation (Non-Medical): Not on file  Physical Activity:   . Days of Exercise per Week: Not on file  . Minutes of Exercise per Session: Not on file  Stress:   . Feeling of Stress : Not on file  Social Connections:   . Frequency of Communication with Friends and Family: Not on file  . Frequency of Social Gatherings with Friends and Family: Not on file  . Attends Religious Services: Not on file  . Active Member of Clubs or Organizations: Not on file  . Attends Archivist Meetings: Not on file  . Marital Status: Not on file  Intimate Partner Violence:   . Fear of  Current or Ex-Partner: Not on file  . Emotionally Abused: Not on file  . Physically Abused: Not on file  . Sexually Abused: Not on file     Review of Systems: General: negative for chills, fever, night sweats or weight changes.  Cardiovascular: negative for chest pain, dyspnea on exertion, edema, orthopnea, palpitations, paroxysmal nocturnal dyspnea or shortness of breath Dermatological: negative for rash Respiratory: negative for cough or wheezing Urologic: negative for hematuria Abdominal: negative for nausea, vomiting, diarrhea, bright red blood per rectum, melena, or hematemesis Neurologic: negative for visual changes, syncope, or dizziness All other systems reviewed and are otherwise negative except as noted above.    Blood pressure (!) 150/72, pulse 81, height 5\' 5"  (1.651 m), weight 171 lb 12.8 oz (77.9 kg), SpO2 96 %.  General appearance: alert and no distress Neck: no adenopathy, no carotid bruit, no JVD, supple, symmetrical, trachea midline and thyroid not enlarged, symmetric, no tenderness/mass/nodules Lungs: clear to auscultation bilaterally Heart: regular rate and rhythm, S1, S2 normal, no murmur, click, rub or gallop Extremities: extremities normal, atraumatic, no cyanosis or edema Pulses: 2+ and symmetric Skin: Skin color, texture, turgor normal. No rashes or lesions Neurologic: Alert and oriented X 3, normal strength and tone. Normal symmetric reflexes. Normal coordination and gait  EKG sinus rhythm at 81 without ST or T wave changes.  I personally reviewed this EKG.  ASSESSMENT AND PLAN:   Essential hypertension History of essential hypertension a blood pressure measured today 150/72.  She is on carvedilol and hydralazine.  Dyslipidemia History of dyslipidemia on statin therapy with lipid profile performed 01/28/2020 revealing total cholesterol 116, LDL 55 and HDL 47.  Coronary artery disease History of CAD status post cardiac cath 01/15/2011 because of jaw pain  which is her anginal equivalent that revealed a 60% mid LAD lesion.  She had anterior takeoff RCA.  She has been asymptomatic since.  Myoview stress test performed 06/14/2018 was nonischemic.  She denies chest pain.      Megan Harp MD FACP,FACC,FAHA, [facility] 02/10/2020 9:14 AM

## 2020-02-24 DIAGNOSIS — J0140 Acute pansinusitis, unspecified: Secondary | ICD-10-CM | POA: Diagnosis not present

## 2020-02-24 DIAGNOSIS — J330 Polyp of nasal cavity: Secondary | ICD-10-CM | POA: Diagnosis not present

## 2020-02-24 DIAGNOSIS — J324 Chronic pansinusitis: Secondary | ICD-10-CM | POA: Diagnosis not present

## 2020-02-24 DIAGNOSIS — J3489 Other specified disorders of nose and nasal sinuses: Secondary | ICD-10-CM | POA: Diagnosis not present

## 2020-03-01 ENCOUNTER — Other Ambulatory Visit: Payer: Self-pay

## 2020-03-01 ENCOUNTER — Other Ambulatory Visit (HOSPITAL_COMMUNITY)
Admission: RE | Admit: 2020-03-01 | Discharge: 2020-03-01 | Disposition: A | Discharge status: Home / Self Care | Payer: Medicare HMO | Source: Ambulatory Visit | Attending: Otolaryngology | Admitting: Otolaryngology

## 2020-03-01 ENCOUNTER — Encounter (HOSPITAL_BASED_OUTPATIENT_CLINIC_OR_DEPARTMENT_OTHER): Payer: Self-pay | Admitting: Otolaryngology

## 2020-03-01 DIAGNOSIS — Z01812 Encounter for preprocedural laboratory examination: Secondary | ICD-10-CM | POA: Diagnosis not present

## 2020-03-01 DIAGNOSIS — Z20822 Contact with and (suspected) exposure to covid-19: Secondary | ICD-10-CM | POA: Insufficient documentation

## 2020-03-01 LAB — SARS CORONAVIRUS 2 (TAT 6-24 HRS): SARS Coronavirus 2: NEGATIVE

## 2020-03-02 ENCOUNTER — Encounter (HOSPITAL_BASED_OUTPATIENT_CLINIC_OR_DEPARTMENT_OTHER)
Admission: RE | Admit: 2020-03-02 | Discharge: 2020-03-02 | Disposition: A | Discharge status: Home / Self Care | Payer: Medicare HMO | Source: Ambulatory Visit | Attending: Otolaryngology | Admitting: Otolaryngology

## 2020-03-02 DIAGNOSIS — F321 Major depressive disorder, single episode, moderate: Secondary | ICD-10-CM | POA: Diagnosis not present

## 2020-03-02 DIAGNOSIS — F1590 Other stimulant use, unspecified, uncomplicated: Secondary | ICD-10-CM | POA: Diagnosis not present

## 2020-03-02 LAB — BASIC METABOLIC PANEL
Anion gap: 8 (ref 5–15)
BUN: 17 mg/dL (ref 8–23)
CO2: 28 mmol/L (ref 22–32)
Calcium: 8.8 mg/dL — ABNORMAL LOW (ref 8.9–10.3)
Chloride: 102 mmol/L (ref 98–111)
Creatinine, Ser: 0.99 mg/dL (ref 0.44–1.00)
GFR calc Af Amer: >60 mL/min (ref >60)
GFR calc non Af Amer: 54 mL/min — ABNORMAL LOW (ref >60)
Glucose, Bld: 116 mg/dL — ABNORMAL HIGH (ref 70–99)
Potassium: 4 mmol/L (ref 3.5–5.1)
Sodium: 138 mmol/L (ref 135–145)

## 2020-03-04 ENCOUNTER — Encounter (HOSPITAL_BASED_OUTPATIENT_CLINIC_OR_DEPARTMENT_OTHER): Payer: Self-pay | Admitting: Otolaryngology

## 2020-03-04 ENCOUNTER — Ambulatory Visit (HOSPITAL_BASED_OUTPATIENT_CLINIC_OR_DEPARTMENT_OTHER): Payer: Medicare HMO | Admitting: Anesthesiology

## 2020-03-04 ENCOUNTER — Ambulatory Visit (HOSPITAL_COMMUNITY)
Admission: RE | Admit: 2020-03-04 | Discharge: 2020-03-04 | Disposition: A | Discharge status: Home / Self Care | Payer: Medicare HMO | Attending: Otolaryngology | Admitting: Otolaryngology

## 2020-03-04 ENCOUNTER — Encounter (HOSPITAL_BASED_OUTPATIENT_CLINIC_OR_DEPARTMENT_OTHER)
Admission: RE | Disposition: A | Discharge status: Home / Self Care | Payer: Self-pay | Source: Home / Self Care | Attending: Otolaryngology

## 2020-03-04 DIAGNOSIS — I110 Hypertensive heart disease with heart failure: Secondary | ICD-10-CM | POA: Diagnosis not present

## 2020-03-04 DIAGNOSIS — I499 Cardiac arrhythmia, unspecified: Secondary | ICD-10-CM | POA: Diagnosis not present

## 2020-03-04 DIAGNOSIS — J45909 Unspecified asthma, uncomplicated: Secondary | ICD-10-CM | POA: Insufficient documentation

## 2020-03-04 DIAGNOSIS — J321 Chronic frontal sinusitis: Secondary | ICD-10-CM | POA: Diagnosis not present

## 2020-03-04 DIAGNOSIS — Z7951 Long term (current) use of inhaled steroids: Secondary | ICD-10-CM | POA: Insufficient documentation

## 2020-03-04 DIAGNOSIS — Z885 Allergy status to narcotic agent status: Secondary | ICD-10-CM | POA: Diagnosis not present

## 2020-03-04 DIAGNOSIS — J343 Hypertrophy of nasal turbinates: Secondary | ICD-10-CM | POA: Insufficient documentation

## 2020-03-04 DIAGNOSIS — J329 Chronic sinusitis, unspecified: Secondary | ICD-10-CM

## 2020-03-04 DIAGNOSIS — Z87891 Personal history of nicotine dependence: Secondary | ICD-10-CM | POA: Diagnosis not present

## 2020-03-04 DIAGNOSIS — Z91040 Latex allergy status: Secondary | ICD-10-CM | POA: Diagnosis not present

## 2020-03-04 DIAGNOSIS — Z7982 Long term (current) use of aspirin: Secondary | ICD-10-CM | POA: Insufficient documentation

## 2020-03-04 DIAGNOSIS — Z888 Allergy status to other drugs, medicaments and biological substances status: Secondary | ICD-10-CM | POA: Insufficient documentation

## 2020-03-04 DIAGNOSIS — E785 Hyperlipidemia, unspecified: Secondary | ICD-10-CM | POA: Insufficient documentation

## 2020-03-04 DIAGNOSIS — J338 Other polyp of sinus: Secondary | ICD-10-CM | POA: Diagnosis not present

## 2020-03-04 DIAGNOSIS — I509 Heart failure, unspecified: Secondary | ICD-10-CM | POA: Insufficient documentation

## 2020-03-04 DIAGNOSIS — J320 Chronic maxillary sinusitis: Secondary | ICD-10-CM | POA: Diagnosis not present

## 2020-03-04 DIAGNOSIS — Z79899 Other long term (current) drug therapy: Secondary | ICD-10-CM | POA: Diagnosis not present

## 2020-03-04 DIAGNOSIS — I2510 Atherosclerotic heart disease of native coronary artery without angina pectoris: Secondary | ICD-10-CM | POA: Insufficient documentation

## 2020-03-04 DIAGNOSIS — K219 Gastro-esophageal reflux disease without esophagitis: Secondary | ICD-10-CM | POA: Insufficient documentation

## 2020-03-04 DIAGNOSIS — E119 Type 2 diabetes mellitus without complications: Secondary | ICD-10-CM | POA: Insufficient documentation

## 2020-03-04 DIAGNOSIS — I5032 Chronic diastolic (congestive) heart failure: Secondary | ICD-10-CM | POA: Diagnosis not present

## 2020-03-04 DIAGNOSIS — M1990 Unspecified osteoarthritis, unspecified site: Secondary | ICD-10-CM | POA: Diagnosis not present

## 2020-03-04 DIAGNOSIS — J323 Chronic sphenoidal sinusitis: Secondary | ICD-10-CM | POA: Diagnosis not present

## 2020-03-04 DIAGNOSIS — J324 Chronic pansinusitis: Secondary | ICD-10-CM | POA: Insufficient documentation

## 2020-03-04 DIAGNOSIS — J322 Chronic ethmoidal sinusitis: Secondary | ICD-10-CM | POA: Diagnosis not present

## 2020-03-04 HISTORY — PX: SINUS ENDO WITH FUSION: SHX5329

## 2020-03-04 HISTORY — PX: TURBINATE REDUCTION: SHX6157

## 2020-03-04 SURGERY — SURGERY, PARANASAL SINUS, ENDOSCOPIC, WITH NASAL SEPTOPLASTY, TURBINOPLASTY, AND MAXILLARY SINUSOTOMY
Anesthesia: General | Site: Nose | Laterality: Bilateral

## 2020-03-04 MED ORDER — OXYMETAZOLINE HCL 0.05 % NA SOLN
NASAL | Status: DC | PRN
  Administered 2020-03-04: 1 via TOPICAL

## 2020-03-04 MED ORDER — SUGAMMADEX SODIUM 200 MG/2ML IV SOLN
INTRAVENOUS | Status: DC | PRN
  Administered 2020-03-04: 200 mg via INTRAVENOUS

## 2020-03-04 MED ORDER — DEXAMETHASONE SODIUM PHOSPHATE 10 MG/ML IJ SOLN
INTRAMUSCULAR | Status: DC | PRN
  Administered 2020-03-04: 10 mg via INTRAVENOUS

## 2020-03-04 MED ORDER — PROPOFOL 10 MG/ML IV BOLUS
INTRAVENOUS | Status: DC | PRN
  Administered 2020-03-04: 120 mg via INTRAVENOUS

## 2020-03-04 MED ORDER — DEXAMETHASONE SODIUM PHOSPHATE 10 MG/ML IJ SOLN
INTRAMUSCULAR | Status: AC
  Filled 2020-03-04: qty 1

## 2020-03-04 MED ORDER — FAMOTIDINE IN NACL 20-0.9 MG/50ML-% IV SOLN
20.0000 mg | Freq: Once | INTRAVENOUS | Status: AC
  Administered 2020-03-04: 20 mg via INTRAVENOUS

## 2020-03-04 MED ORDER — FENTANYL CITRATE (PF) 100 MCG/2ML IJ SOLN
25.0000 ug | INTRAMUSCULAR | Status: DC | PRN
  Administered 2020-03-04: 25 ug via INTRAVENOUS

## 2020-03-04 MED ORDER — DIPHENHYDRAMINE HCL 50 MG/ML IJ SOLN
INTRAMUSCULAR | Status: DC | PRN
  Administered 2020-03-04: 12.5 mg via INTRAVENOUS

## 2020-03-04 MED ORDER — EPHEDRINE SULFATE-NACL 50-0.9 MG/10ML-% IV SOSY
PREFILLED_SYRINGE | INTRAVENOUS | Status: DC | PRN
  Administered 2020-03-04: 5 mg via INTRAVENOUS
  Administered 2020-03-04 (×2): 10 mg via INTRAVENOUS

## 2020-03-04 MED ORDER — ACETAMINOPHEN 500 MG PO TABS
ORAL_TABLET | ORAL | Status: AC
  Filled 2020-03-04: qty 2

## 2020-03-04 MED ORDER — ONDANSETRON HCL 4 MG/2ML IJ SOLN
INTRAMUSCULAR | Status: AC
  Filled 2020-03-04: qty 2

## 2020-03-04 MED ORDER — FAMOTIDINE IN NACL 20-0.9 MG/50ML-% IV SOLN
INTRAVENOUS | Status: AC
  Filled 2020-03-04: qty 50

## 2020-03-04 MED ORDER — PHENYLEPHRINE 40 MCG/ML (10ML) SYRINGE FOR IV PUSH (FOR BLOOD PRESSURE SUPPORT)
PREFILLED_SYRINGE | INTRAVENOUS | Status: DC | PRN
  Administered 2020-03-04: 80 ug via INTRAVENOUS

## 2020-03-04 MED ORDER — TRIAMCINOLONE ACETONIDE 40 MG/ML IJ SUSP
INTRAMUSCULAR | Status: DC | PRN
  Administered 2020-03-04: 3 mL

## 2020-03-04 MED ORDER — PROPOFOL 10 MG/ML IV BOLUS
INTRAVENOUS | Status: AC
  Filled 2020-03-04: qty 20

## 2020-03-04 MED ORDER — DIPHENHYDRAMINE HCL 50 MG/ML IJ SOLN
INTRAMUSCULAR | Status: AC
  Filled 2020-03-04: qty 1

## 2020-03-04 MED ORDER — MUPIROCIN 2 % EX OINT
TOPICAL_OINTMENT | CUTANEOUS | Status: DC | PRN
  Administered 2020-03-04: 1 via TOPICAL

## 2020-03-04 MED ORDER — FENTANYL CITRATE (PF) 100 MCG/2ML IJ SOLN
INTRAMUSCULAR | Status: AC
  Filled 2020-03-04: qty 2

## 2020-03-04 MED ORDER — CEFAZOLIN SODIUM-DEXTROSE 2-4 GM/100ML-% IV SOLN
INTRAVENOUS | Status: AC
  Filled 2020-03-04: qty 100

## 2020-03-04 MED ORDER — CEPHALEXIN 500 MG PO CAPS: 500.0000 mg | ORAL_CAPSULE | Freq: Three times a day (TID) | ORAL | 0 refills | Status: AC

## 2020-03-04 MED ORDER — ROCURONIUM BROMIDE 10 MG/ML (PF) SYRINGE
PREFILLED_SYRINGE | INTRAVENOUS | Status: DC | PRN
  Administered 2020-03-04: 50 mg via INTRAVENOUS

## 2020-03-04 MED ORDER — ONDANSETRON HCL 4 MG/2ML IJ SOLN: 4.0000 mg | Freq: Once | INTRAMUSCULAR | Status: DC | PRN

## 2020-03-04 MED ORDER — LIDOCAINE 2% (20 MG/ML) 5 ML SYRINGE
INTRAMUSCULAR | Status: AC
  Filled 2020-03-04: qty 5

## 2020-03-04 MED ORDER — ACETAMINOPHEN 500 MG PO TABS
1000.0000 mg | ORAL_TABLET | Freq: Once | ORAL | Status: AC
  Administered 2020-03-04: 1000 mg via ORAL

## 2020-03-04 MED ORDER — FENTANYL CITRATE (PF) 100 MCG/2ML IJ SOLN
INTRAMUSCULAR | Status: DC | PRN
Start: 2020-03-04 — End: 2020-03-04
  Administered 2020-03-04 (×2): 50 ug via INTRAVENOUS

## 2020-03-04 MED ORDER — LACTATED RINGERS IV SOLN: INTRAVENOUS | Status: DC

## 2020-03-04 MED ORDER — LIDOCAINE-EPINEPHRINE 1 %-1:100000 IJ SOLN
INTRAMUSCULAR | Status: DC | PRN
  Administered 2020-03-04: 5 mL

## 2020-03-04 MED ORDER — ONDANSETRON HCL 4 MG/2ML IJ SOLN
INTRAMUSCULAR | Status: DC | PRN
  Administered 2020-03-04: 4 mg via INTRAVENOUS

## 2020-03-04 MED ORDER — ROCURONIUM BROMIDE 10 MG/ML (PF) SYRINGE
PREFILLED_SYRINGE | INTRAVENOUS | Status: AC
  Filled 2020-03-04: qty 10

## 2020-03-04 MED ORDER — TRIAMCINOLONE ACETONIDE 40 MG/ML IJ SUSP
INTRAMUSCULAR | Status: AC
  Filled 2020-03-04: qty 5

## 2020-03-04 MED ORDER — ACETAMINOPHEN 10 MG/ML IV SOLN: 1000.0000 mg | Freq: Once | INTRAVENOUS | Status: DC | PRN

## 2020-03-04 MED ORDER — LIDOCAINE 2% (20 MG/ML) 5 ML SYRINGE
INTRAMUSCULAR | Status: DC | PRN
  Administered 2020-03-04: 80 mg via INTRAVENOUS

## 2020-03-04 MED ORDER — CEFAZOLIN SODIUM-DEXTROSE 2-4 GM/100ML-% IV SOLN
2.0000 g | INTRAVENOUS | Status: AC
  Administered 2020-03-04: 2 g via INTRAVENOUS

## 2020-03-04 SURGICAL SUPPLY — 58 items
BLADE RAD40 ROTATE 4M 4 5PK (BLADE) IMPLANT
BLADE RAD60 ROTATE M4 4 5PK (BLADE) IMPLANT
BLADE ROTATE RAD 12 4 M4 (BLADE) IMPLANT
BLADE ROTATE RAD 40 4 M4 (BLADE) ×1 IMPLANT
BLADE ROTATE TRICUT 4X13 M4 (BLADE) ×2 IMPLANT
BLADE TRICUT ROTATE M4 4 5PK (BLADE) IMPLANT
BUR HS RAD FRONTAL 3 (BURR) IMPLANT
CANISTER SUC SOCK COL 7IN (MISCELLANEOUS) ×2 IMPLANT
CANISTER SUCT 1200ML W/VALVE (MISCELLANEOUS) ×4 IMPLANT
COAGULATOR SUCT SWTCH 10FR 6 (ELECTROSURGICAL) IMPLANT
COVER WAND RF STERILE (DRAPES) IMPLANT
DECANTER SPIKE VIAL GLASS SM (MISCELLANEOUS) IMPLANT
DRAPE SURG 17X23 STRL (DRAPES) ×2 IMPLANT
DRESSING NASAL KENNEDY 3.5X.9 (MISCELLANEOUS) IMPLANT
DRSG NASAL KENNEDY 3.5X.9 (MISCELLANEOUS)
DRSG NASOPORE 8CM (GAUZE/BANDAGES/DRESSINGS) ×1 IMPLANT
DRSG TELFA 3X8 NADH (GAUZE/BANDAGES/DRESSINGS) IMPLANT
ELECT COATED BLADE 2.86 ST (ELECTRODE) IMPLANT
ELECT REM PT RETURN 9FT ADLT (ELECTROSURGICAL) ×2
ELECTRODE REM PT RTRN 9FT ADLT (ELECTROSURGICAL) IMPLANT
GLOVE BIOGEL PI IND STRL 7.0 (GLOVE) IMPLANT
GLOVE BIOGEL PI INDICATOR 7.0 (GLOVE) ×1
GLOVE SURG SYN 7.0 (GLOVE) ×4 IMPLANT
GLOVE SURG SYN 7.0 PF PI (GLOVE) ×2 IMPLANT
GOWN STRL REUS W/ TWL LRG LVL3 (GOWN DISPOSABLE) ×2 IMPLANT
GOWN STRL REUS W/TWL LRG LVL3 (GOWN DISPOSABLE) ×4
IV NS 1000ML (IV SOLUTION)
IV NS 1000ML BAXH (IV SOLUTION) IMPLANT
IV NS 500ML (IV SOLUTION) ×2
IV NS 500ML BAXH (IV SOLUTION) ×1 IMPLANT
IV SET EXT 30 76VOL 4 MALE LL (IV SETS) IMPLANT
NDL PRECISIONGLIDE 27X1.5 (NEEDLE) ×1 IMPLANT
NDL SAFETY ECLIPSE 18X1.5 (NEEDLE) IMPLANT
NEEDLE HYPO 18GX1.5 SHARP (NEEDLE) ×2
NEEDLE PRECISIONGLIDE 27X1.5 (NEEDLE) ×2 IMPLANT
NS IRRIG 1000ML POUR BTL (IV SOLUTION) ×1 IMPLANT
PACK BASIN DAY SURGERY FS (CUSTOM PROCEDURE TRAY) ×2 IMPLANT
PACK ENT DAY SURGERY (CUSTOM PROCEDURE TRAY) ×2 IMPLANT
PAD DRESSING TELFA 3X8 NADH (GAUZE/BANDAGES/DRESSINGS) IMPLANT
PENCIL SMOKE EVACUATOR (MISCELLANEOUS) IMPLANT
SLEEVE SCD COMPRESS KNEE MED (MISCELLANEOUS) ×1 IMPLANT
SOLUTION BUTLER CLEAR DIP (MISCELLANEOUS) ×2 IMPLANT
SPLINT NASAL AIRWAY SILICONE (MISCELLANEOUS) IMPLANT
SPONGE GAUZE 2X2 8PLY STRL LF (GAUZE/BANDAGES/DRESSINGS) ×2 IMPLANT
SPONGE NEURO XRAY DETECT 1X3 (DISPOSABLE) ×2 IMPLANT
SUT ETHILON 3 0 PS 1 (SUTURE) IMPLANT
SUT PLAIN 4 0 ~~LOC~~ 1 (SUTURE) IMPLANT
SUT SILK 3 0 PS 1 (SUTURE) IMPLANT
SYR 3ML 23GX1 SAFETY (SYRINGE) IMPLANT
SYR 5ML LUER SLIP (SYRINGE) ×1 IMPLANT
TOWEL GREEN STERILE FF (TOWEL DISPOSABLE) ×4 IMPLANT
TRACKER ENT INSTRUMENT (MISCELLANEOUS) ×2 IMPLANT
TRACKER ENT PATIENT (MISCELLANEOUS) ×2 IMPLANT
TUBE CONNECTING 20X1/4 (TUBING) ×2 IMPLANT
TUBE SALEM SUMP 12R W/ARV (TUBING) IMPLANT
TUBE SALEM SUMP 16 FR W/ARV (TUBING) ×1 IMPLANT
TUBING STRAIGHTSHOT EPS 5PK (TUBING) ×2 IMPLANT
YANKAUER SUCT BULB TIP NO VENT (SUCTIONS) ×2 IMPLANT

## 2020-03-04 NOTE — Anesthesia Postprocedure Evaluation (Signed)
Anesthesia Post Note  Patient: Megan Hull  Procedure(s) Performed: SINUS ENDOSCOPY WITH FUSION NAVIGATION (Bilateral Nose) TURBINATE REDUCTION (Bilateral Nose)     Patient location during evaluation: PACU Anesthesia Type: General Level of consciousness: awake Pain management: pain level controlled Vital Signs Assessment: post-procedure vital signs reviewed and stable Respiratory status: spontaneous breathing, nonlabored ventilation, respiratory function stable and patient connected to nasal cannula oxygen Cardiovascular status: blood pressure returned to baseline and stable Postop Assessment: no apparent nausea or vomiting Anesthetic complications: no   No complications documented.  Last Vitals:  Vitals:   03/04/20 1300 03/04/20 1332  BP: 121/63 131/66  Pulse: 60 63  Resp: 14 15  Temp:  (!) 36.1 C  SpO2: 94% 98%    Last Pain:  Vitals:   03/04/20 1332  TempSrc:   PainSc: 0-No pain                 Brigido Mera P Billye Pickerel

## 2020-03-04 NOTE — Discharge Instructions (Signed)
  Post Anesthesia Home Care Instructions  Activity: Get plenty of rest for the remainder of the day. A responsible individual must stay with you for 24 hours following the procedure.  For the next 24 hours, DO NOT: -Drive a car -Paediatric nurse -Drink alcoholic beverages -Take any medication unless instructed by your physician -Make any legal decisions or sign important papers.  Meals: Start with liquid foods such as gelatin or soup. Progress to regular foods as tolerated. Avoid greasy, spicy, heavy foods. If nausea and/or vomiting occur, drink only clear liquids until the nausea and/or vomiting subsides. Call your physician if vomiting continues.  Special Instructions/Symptoms: Your throat may feel dry or sore from the anesthesia or the breathing tube placed in your throat during surgery. If this causes discomfort, gargle with warm salt water. The discomfort should disappear within 24 hours.  Next dose of Tylenol can be given at 4:00pm if needed.

## 2020-03-04 NOTE — Progress Notes (Signed)
Additional warm blankets added for low temp. Unable to assess oral temp due to pt allergy/ alpha gal syndrome.

## 2020-03-04 NOTE — Anesthesia Preprocedure Evaluation (Addendum)
Anesthesia Evaluation  Patient identified by MRN, date of birth, ID band Patient awake    Reviewed: Allergy & Precautions, NPO status , Patient's Chart, lab work & pertinent test results  Airway Mallampati: II  TM Distance: >3 FB Neck ROM: Full    Dental  (+) Edentulous Upper, Edentulous Lower   Pulmonary shortness of breath and with exertion, asthma , former smoker,    Pulmonary exam normal breath sounds clear to auscultation       Cardiovascular hypertension, Pt. on home beta blockers and Pt. on medications + CAD and +CHF  Normal cardiovascular exam Rhythm:Regular Rate:Normal  Myocardial perfusion: Nuclear stress EF: 73%. The left ventricular ejection fraction is hyperdynamic (>65%). There was no ST segment deviation noted during stress. The study is normal. This is a low risk study.    Neuro/Psych negative neurological ROS  negative psych ROS   GI/Hepatic Neg liver ROS, GERD  ,  Endo/Other  diabetes  Renal/GU negative Renal ROS     Musculoskeletal negative musculoskeletal ROS (+)   Abdominal   Peds  Hematology HLD   Anesthesia Other Findings CHRONIC PANSINUSITIS  Reproductive/Obstetrics                            Anesthesia Physical Anesthesia Plan  ASA: III  Anesthesia Plan: General   Post-op Pain Management:    Induction: Intravenous  PONV Risk Score and Plan: 4 or greater and Ondansetron, Dexamethasone, Propofol infusion and Treatment may vary due to age or medical condition  Airway Management Planned: Oral ETT  Additional Equipment:   Intra-op Plan:   Post-operative Plan: Extubation in OR  Informed Consent: I have reviewed the patients History and Physical, chart, labs and discussed the procedure including the risks, benefits and alternatives for the proposed anesthesia with the patient or authorized representative who has indicated his/her understanding and  acceptance.       Plan Discussed with: CRNA  Anesthesia Plan Comments:        Anesthesia Quick Evaluation

## 2020-03-04 NOTE — Progress Notes (Signed)
I wasted 1.75ml/75 mcg of fentanyl on this patient with Edyth Gunnels, RN.The patient was purged from the pyxis.

## 2020-03-04 NOTE — Op Note (Signed)
Operative Note:  ENDOSCOPIC SINUS SURGERY WITH NAVIGATION    INFERIOR TURBINATE REDUCTION  Patient: Megan Hull  Medical record number: [id number]  Date:03/04/2020  Pre-operative Indications: 1.  Chronic Sinusitis     2.  Bilateral inferior turbinate hypertrophy  Postoperative Indications: Same  Surgical Procedure: 1. Bilateral revision endoscopic sinus surgery with intraoperative computer-assisted navigation (Fusion) consisting of: Bilateral maxillary antrostomy with removal of diseased tissue, bilateral total ethmoidectomy, bilateral nasal frontal recess exploration, bilateral sphenoidotomy with removal of diseased tissue    2.  Bilateral Inferior Turbinate Reduction  Anesthesia: GET  Surgeon: Delsa Bern, M.D.  Complications: None  EBL: 150 cc  Findings: Extensive polypoid disease involving all sinuses with mucopurulent discharge consistent with chronic infection and bilateral inferior turbinate hypertrophy. Kenalog/Bactroban slurry instilled in all sinuses and Naso-pore nasal packing placed.   Brief History: The patient is a 79 y.o. female with a history of chronic sinusitis and turbinate hypertrophy. The patient has been on medical therapy to reduce nasal mucosal edema and infection including antibiotics, saline nasal spray and topical nasal steroids. Despite appropriate medical therapy the patient continues to have ongoing symptoms. Given the patient's history and findings, the above surgical procedures were recommended, risks and benefits were discussed in detail with the patient may understand and agree with our plan for surgery which is scheduled at [facility] under general anesthesia as an outpatient.  Surgical Procedure: The patient is brought to the operating room on 03/04/2020 and placed in supine position on the operating table. General endotracheal anesthesia was established without difficulty. When the patient was adequately anesthetized, surgical  timeout was performed with correct identification of the patient and the surgical procedure. The patient's nose was then injected with five cc of 1% lidocaine 1:100,000 dilution epinephrine which was injected in a submucosal fashion. The patient's nose was then packed with Afrin-soaked cottonoid pledgets were left in place for approximately 10 minutes to allow for vasoconstriction and hemostasis.  The Xomed Fusion navigation headgear was applied in anatomic and surgical landmarks were identified and confirmed, navigation was used throughout the sinus component of the surgical procedure.  The patient undergone prior endoscopic sinus surgery with midline nasal septum and patent middle meatus. The patient had heavy polypoid disease involving the nasal passageway and posterior nasopharynx, this was resected with a 0 degree endoscope bilaterally using a straight microdebrider to the level of the middle meatus.  With the patient prepped draped and prepared for surgery, nasal nasal endoscopy was performed on the right side.  Using a 0 degree endoscope and a straight microdebrider, a total ethmoidectomy was performed dissecting from anterior to posterior along the floor of the ethmoid sinus removing bony septations and diseased mucosa.  Using a 45 degree telescope and a curved microdebrider dissection was then carried out along the roof of the ethmoid sinus with navigation, completing a total ethmoidectomy.  Attention was then turned to the nasal frontal recess and again using a curved microdebrider, navigation and endoscopic visualization the nasal frontal recess was widely open, underlying ethmoid cells and diseased mucosa resected creating a widely patent nasal frontal recess.  The lateral nasal wall was inspected, uncinate process was resected using a through-cutting forcep and the natural ostium of the maxillary sinus was enlarged in a posterior and inferior direction.  Within the maxillary sinus thick mucoid  material and polypoid soft tissue was resected.  The middle turbinate was medialized and the natural ostium of the sphenoid sinus was identified, sphenoidotomy was performed.  The natural ostium was enlarged in a lateral and inferior direction removing diseased mucosa from the ostium and from within the sinus itself.  The patient's left side was then inspected and total ethmoidectomy was performed using a 0 degree telescope and straight microdebrider along the floor of the ethmoid sinus, a 45 degree telescope and curved microdebrider with navigation was used along the roof the ethmoid sinus from posterior to anterior to complete a total ethmoidectomy.  The nasal frontal recess was then explored and underlying ethmoid disease was resected with a curved microdebrider, the nasal frontal recess was widely opened and diseased material was resected from within the sinus.  Attention was then turned to the lateral nasal wall, the natural ostium maxillary sinus was identified. The uncinate process was resected.  Diseased mucosa from within the maxilla sinus was then cleared and the ostium was enlarged in a posterior and inferior direction.  The natural ostium of the sphenoid sinus was then identified with navigation and direct visualization after medialization of the middle turbinate.  A sphenoidotomy was performed using a straight microdebrider, diseased tissue was removed within the sphenoid sinus.  Attention was then turned to the inferior turbinates, bilateral inferior turbinate intramural cautery was performed with cautery setting at 42 W.  2 submucosal passes were made in each inferior turbinate.  After completing cautery, anterior vertical incisions were created and overlying soft tissue was elevated, a small amount of turbinate bone was resected.  The turbinates were then outfractured to create a more patent nasal passageway.  At the conclusion of the procedure, a 50-50 mix of Kenalog 40 and Bactroban was  instilled in the sinuses.  Nasa-pore nasal packing was placed in the common ethmoid cavity bilaterally.  Surgical sponge count was correct. An oral gastric tube was passed and the stomach contents were aspirated. Patient was awakened from anesthetic and transferred from the operating room to the recovery room in stable condition. There were no complications and blood loss was 150 cc.   Delsa Bern, M.D. [facility] 03/04/2020

## 2020-03-04 NOTE — H&P (Signed)
Megan Hull is an 79 y.o. female.   Chief Complaint: Chronic sinusitis HPI: Patient with history of chronic sinusitis, treated with multiple course of antibiotics and appropriate medical therapy with continued ongoing symptoms.  Past Medical History:  Diagnosis Date  . Arthritis   . CHF (congestive heart failure) ([place])   . Diabetes mellitus without complication (HCC)    borderline  . Diarrhea   . Diverticulosis   . Dysrhythmia    controlled with Metoprolol-12-19-13 LOV -Dr. Gwenlyn Found sees yearly  . Family history of premature CAD   . GERD (gastroesophageal reflux disease)   . Heart murmur    yrs ago  . HLD (hyperlipidemia)    pt reports cholesterol normal now (07/21/19).  [name] Hypertension   . Lower extremity edema   . Status post dilation of esophageal narrowing   . Supraventricular arrhythmia   . Tobacco abuse     Past Surgical History:  Procedure Laterality Date  . ANTERIOR CERVICAL DECOMP/DISCECTOMY FUSION N/A 06/08/2015   Procedure: CERVICAL FIVE-SIX ,CERVICAL SIX-SEVEN ANTERIOR CERVICAL DECOMPRESSION/DISCECTOMY FUSION PLATING BONEGRAFT;  Surgeon: Kristeen Miss, MD;  Location: [facility];  Service: Neurosurgery;  Laterality: N/A;  . 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 Adora Fridge)  . CATARACT EXTRACTION, BILATERAL     most recent 11/19  . COLONOSCOPY    . ESOPHAGEAL DILATION  2014  . infected lymph node surgery  80's  . NM MYOCAR PERF WALL MOTION  04/2011   bruce myoview; normal pattern of perfusion, EF 80%, low risk scan  . TONSILLECTOMY  70's  . TRANSTHORACIC ECHOCARDIOGRAM  12/2007   mild conc LVH    Family History  Problem Relation Age of Onset  . Heart disease Brother        MI @ 3, HTN @ 14, DM @ 49   . Heart disease Father        MI  . Heart disease Mother        MI, CVA @ 94  . Heart disease Sister        CHF, CVA  . Breast cancer Sister 56  . Esophageal cancer Brother   . Alzheimer's disease Sister   .  Heart disease Brother        MI in 25s, lung cancer  . Heart disease Sister   . Hypertension Sister        x2  . Hyperlipidemia Sister        x2  . Diabetes Sister        x2  . Cancer Sister   . Hypertension Brother   . Colon cancer Neg Hx    Social History:  reports that she quit smoking about 4 years ago. Her smoking use included cigarettes. She has a 3.75 pack-year smoking history. She has never used smokeless tobacco. She reports previous alcohol use. She reports that she does not use drugs.  Allergies:  Allergies  Allergen Reactions  . Nexium [Esomeprazole Magnesium] Anaphylaxis    Pt states she was unable to swallow for several hours after taking the nexium  . Other Hives, Shortness Of Breath and Other (See Comments)    Red Fish (Hives) Bojangle's chicken (swelling and shortness of breath) PAPRIKA  . Codeine Other (See Comments)    REACTION: chest pain  . Ace Inhibitors Swelling    Angioedema   . Latex Other (See Comments)    Causes skin to turn red per patient   . Monosodium Glutamate  Other (See Comments)    dizziness  . Amlodipine Other (See Comments)    Lower extremity swelling, itching  . Shellfish Allergy Hives  . Tape Rash    Coban wrap turned her arm red (after stress test)    Medications Prior to Admission  Medication Sig Dispense Refill  . albuterol (VENTOLIN HFA) 108 (90 Base) MCG/ACT inhaler     . aspirin 81 MG tablet Take 1 tablet (81 mg total) by mouth daily. 30 tablet   . budesonide (PULMICORT) 0.25 MG/2ML nebulizer solution Apply 2 mL to your nasal saline irrigation once per day.    . carvedilol (COREG) 25 MG tablet Take 1 tablet (25 mg total) by mouth 2 (two) times daily with a meal. Patient needs to schedule 6 month follow up for further refills 180 tablet 2  . cetirizine (ZYRTEC) 10 MG tablet Take 10 mg by mouth daily.     . Cholecalciferol (VITAMIN D3 PO) Take 50 mg by mouth daily.    . diphenhydrAMINE (BENADRYL ALLERGY) 25 MG tablet Take 2  tablets by mouth as needed for anaphylaxis.    . fluticasone (FLONASE) 50 MCG/ACT nasal spray     . furosemide (LASIX) 40 MG tablet TAKE 1 TABLET IN THE MORNING AND TAKE 1/2 TABLET IN THE EVENING ( TAKE 1 AND 1/2 TABLETS EVERY DAY ) 130 tablet 3  . hydrALAZINE (APRESOLINE) 25 MG tablet Take 1 tablet (25 mg total) by mouth 3 (three) times daily as needed (FOR SBP>150). 270 tablet 1  . isosorbide mononitrate (IMDUR) 30 MG 24 hr tablet TAKE 1 TABLET EVERY DAY 90 tablet 3  . montelukast (SINGULAIR) 10 MG tablet Take 10 mg by mouth at bedtime.    . Multiple Vitamins-Minerals (AIRBORNE PO) Take 4 tablets by mouth daily.    . Probiotic Product (PROBIOTIC DAILY PO) Take by mouth.    . simvastatin (ZOCOR) 40 MG tablet Take 1 tablet (40 mg total) by mouth daily. 90 tablet 3  . vitamin C (ASCORBIC ACID) 500 MG tablet Take 500 mg by mouth daily.    . Alum & Mag Hydroxide-Simeth (ANTACID ADVANCED PO) Take 1 tablet by mouth daily as needed (indigestion).     [name] EPINEPHrine 0.3 mg/0.3 mL IJ SOAJ injection Inject 0.3 mLs (0.3 mg total) into the muscle once as needed (anaphylaxis). 2 Device 0  . neomycin-polymyxin-hydrocortisone (CORTISPORIN) 3.5-10000-1 ophthalmic suspension Apply 1-2 drops to the affected toes BID after soaking 7.5 mL 1    Results for orders placed or performed during the hospital encounter of 03/04/20 (from the past 48 hour(s))  Basic metabolic panel per protocol     Status: Abnormal   Collection Time: 03/02/20 12:00 PM  Result Value Ref Range   Sodium 138 135 - 145 mmol/L   Potassium 4.0 3.5 - 5.1 mmol/L   Chloride 102 98 - 111 mmol/L   CO2 28 22 - 32 mmol/L   Glucose, Bld 116 (H) 70 - 99 mg/dL    Comment: Glucose reference range applies only to samples taken after fasting for at least 8 hours.   BUN 17 8 - 23 mg/dL   Creatinine, Ser 0.99 0.44 - 1.00 mg/dL   Calcium 8.8 (L) 8.9 - 10.3 mg/dL   GFR calc non Af Amer 54 (L) >60 mL/min   GFR calc Af Amer >60 >60 mL/min   Anion gap 8 5 -  15    Comment: Performed at [facility] [address], [place], [place] [address]  No results found.  Review of Systems  Constitutional: Negative.   HENT: Positive for postnasal drip and sinus pain.   Respiratory: Positive for cough.   Cardiovascular: Negative.     Blood pressure (!) 142/70, pulse 64, temperature 97.7 F (36.5 C), temperature source Axillary, height 5\' 5"  (1.651 m), weight 77.8 kg, SpO2 97 %. Physical Exam Constitutional:      Appearance: She is normal weight.  HENT:     Nose:     Comments: Moderate erythema of the mucosa with turbinate hypertrophy. Cardiovascular:     Rate and Rhythm: Normal rate.     Pulses: Normal pulses.  Pulmonary:     Effort: Pulmonary effort is normal.  Neurological:     Mental Status: She is alert.      Assessment/Plan Patient admitted for outpatient endoscopic sinus surgery under general anesthesia.  Jerrell Belfast, MD 03/04/2020, 9:46 AM

## 2020-03-04 NOTE — Transfer of Care (Signed)
Immediate Anesthesia Transfer of Care Note  Patient: Megan Hull  Procedure(s) Performed: SINUS ENDOSCOPY WITH FUSION NAVIGATION (Bilateral Nose) TURBINATE REDUCTION (Bilateral Nose)  Patient Location: PACU  Anesthesia Type:General  Level of Consciousness: awake, alert  and oriented  Airway & Oxygen Therapy: Patient Spontanous Breathing and Patient connected to face mask oxygen  Post-op Assessment: Report given to RN and Post -op Vital signs reviewed and stable  Post vital signs: Reviewed and stable  Last Vitals:  Vitals Value Taken Time  BP 132/78 03/04/20 1154  Temp    Pulse 69 03/04/20 1159  Resp 18 03/04/20 1159  SpO2 95 % 03/04/20 1159  Vitals shown include unvalidated device data.  Last Pain:  Vitals:   03/04/20 0913  TempSrc: Axillary  PainSc: 0-No pain         Complications: No complications documented.

## 2020-03-04 NOTE — Anesthesia Procedure Notes (Signed)
Procedure Name: Intubation Date/Time: 03/04/2020 10:17 AM Performed by: Genelle Bal, CRNA Pre-anesthesia Checklist: Patient identified, Emergency Drugs available, Suction available and Patient being monitored Patient Re-evaluated:Patient Re-evaluated prior to induction Oxygen Delivery Method: Circle system utilized Preoxygenation: Pre-oxygenation with 100% oxygen Induction Type: IV induction Ventilation: Mask ventilation without difficulty Laryngoscope Size: Miller and 2 Grade View: Grade I Tube type: Oral Tube size: 7.0 mm Number of attempts: 1 Airway Equipment and Method: Stylet and Oral airway Placement Confirmation: ETT inserted through vocal cords under direct vision,  positive ETCO2 and breath sounds checked- equal and bilateral Secured at: 20 cm Tube secured with: Tape Dental Injury: Teeth and Oropharynx as per pre-operative assessment

## 2020-03-05 ENCOUNTER — Encounter (HOSPITAL_BASED_OUTPATIENT_CLINIC_OR_DEPARTMENT_OTHER): Payer: Self-pay | Admitting: Otolaryngology

## 2020-03-05 LAB — SURGICAL PATHOLOGY

## 2020-04-13 DIAGNOSIS — Z91018 Allergy to other foods: Secondary | ICD-10-CM | POA: Diagnosis not present

## 2020-04-20 DIAGNOSIS — H04123 Dry eye syndrome of bilateral lacrimal glands: Secondary | ICD-10-CM | POA: Diagnosis not present

## 2020-04-20 DIAGNOSIS — Z961 Presence of intraocular lens: Secondary | ICD-10-CM | POA: Diagnosis not present

## 2020-04-20 DIAGNOSIS — E119 Type 2 diabetes mellitus without complications: Secondary | ICD-10-CM | POA: Diagnosis not present

## 2020-04-20 DIAGNOSIS — H0102B Squamous blepharitis left eye, upper and lower eyelids: Secondary | ICD-10-CM | POA: Diagnosis not present

## 2020-04-20 DIAGNOSIS — H40013 Open angle with borderline findings, low risk, bilateral: Secondary | ICD-10-CM | POA: Diagnosis not present

## 2020-04-20 DIAGNOSIS — H1013 Acute atopic conjunctivitis, bilateral: Secondary | ICD-10-CM | POA: Diagnosis not present

## 2020-04-20 DIAGNOSIS — H0102A Squamous blepharitis right eye, upper and lower eyelids: Secondary | ICD-10-CM | POA: Diagnosis not present

## 2020-04-21 DIAGNOSIS — J309 Allergic rhinitis, unspecified: Secondary | ICD-10-CM | POA: Diagnosis not present

## 2020-04-21 DIAGNOSIS — Z91014 Allergy to mammalian meats: Secondary | ICD-10-CM | POA: Diagnosis not present

## 2020-04-27 ENCOUNTER — Other Ambulatory Visit: Payer: Self-pay | Admitting: Family Medicine

## 2020-04-27 DIAGNOSIS — Z1231 Encounter for screening mammogram for malignant neoplasm of breast: Secondary | ICD-10-CM

## 2020-06-01 ENCOUNTER — Other Ambulatory Visit: Payer: Self-pay | Admitting: General Practice

## 2020-06-01 ENCOUNTER — Other Ambulatory Visit: Payer: Self-pay | Admitting: Cardiovascular Disease

## 2020-06-01 DIAGNOSIS — R0600 Dyspnea, unspecified: Secondary | ICD-10-CM

## 2020-06-01 DIAGNOSIS — R0609 Other forms of dyspnea: Secondary | ICD-10-CM

## 2020-06-01 DIAGNOSIS — E785 Hyperlipidemia, unspecified: Secondary | ICD-10-CM

## 2020-06-02 DIAGNOSIS — Z87891 Personal history of nicotine dependence: Secondary | ICD-10-CM | POA: Diagnosis not present

## 2020-06-02 DIAGNOSIS — J309 Allergic rhinitis, unspecified: Secondary | ICD-10-CM | POA: Diagnosis not present

## 2020-06-02 DIAGNOSIS — Z7982 Long term (current) use of aspirin: Secondary | ICD-10-CM | POA: Diagnosis not present

## 2020-06-02 DIAGNOSIS — N390 Urinary tract infection, site not specified: Secondary | ICD-10-CM | POA: Diagnosis not present

## 2020-06-02 DIAGNOSIS — Z79899 Other long term (current) drug therapy: Secondary | ICD-10-CM | POA: Diagnosis not present

## 2020-06-02 DIAGNOSIS — I509 Heart failure, unspecified: Secondary | ICD-10-CM | POA: Diagnosis not present

## 2020-06-02 DIAGNOSIS — E785 Hyperlipidemia, unspecified: Secondary | ICD-10-CM | POA: Diagnosis not present

## 2020-06-02 DIAGNOSIS — M5442 Lumbago with sciatica, left side: Secondary | ICD-10-CM | POA: Diagnosis not present

## 2020-06-02 DIAGNOSIS — I110 Hypertensive heart disease with heart failure: Secondary | ICD-10-CM | POA: Diagnosis not present

## 2020-06-08 ENCOUNTER — Ambulatory Visit
Admission: RE | Admit: 2020-06-08 | Discharge: 2020-06-08 | Disposition: A | Discharge status: Home / Self Care | Payer: Medicare HMO | Source: Ambulatory Visit | Attending: Family Medicine | Admitting: Family Medicine

## 2020-06-08 ENCOUNTER — Other Ambulatory Visit: Payer: Self-pay

## 2020-06-08 DIAGNOSIS — Z1231 Encounter for screening mammogram for malignant neoplasm of breast: Secondary | ICD-10-CM | POA: Diagnosis not present

## 2020-06-16 NOTE — Progress Notes (Addendum)
Chief Complaint  Patient presents with  . Follow-up    Rm 2, alone, c/o mild back pain      HISTORY OF PRESENT ILLNESS: Today 06/17/20  Megan Hull is a 80 y.o. female here today for follow up for sciatica. She has had two events this year of sudden onset lower back pain with radiation to left buttock and weakness of left leg. She was evaluated in ER 12/22 following 5 days of low back pain. Workup was unremarkable with exception of trace leukocytes on UA. Culture was negative. She was given a short course of valium 2mg  and discharged home. She reports that increasing diazepam to 4mg  was very helpful in managing pain and it resolved within 2-3 days. She reports that she did receive a prescription for cephalexin from her pharmacy but not sure why. She denies urinary symptoms or abdominal pain.    HISTORY (copied from Dr Cathren Laine previous note)  HPI 07/21/2019: This is a lovely 80 year old patient referred back to use for sciatica. We saw her a few years ago for the same. She woke up one morning and couldn't lift her left leg, she had slept in a chair because she had sinus problems and couldn't breathe, she denied any upper extremity of facial or other issues, more muscle stiffness than numbness and tingling, she went to the ED and she was not having a stroke. Hurts midline in the low back. She woke up that day with stiffness, it was improving while in the ED. She had numbness and weakness in the ED, it felt like symptoms several years ago, resolved in several hours, she was offered an MRI of the brain. Now back to the same as she was before, she has some pain on the lateral side of her leg now in the peroneal distribution. No other focal neurologic deficits, associated symptoms, inciting events or modifiable factors.CMP with elevated glucose, cbc unremarkable. We will send her to PT.   I reviewed emergency room notes, patient was seen after waking up with left leg stiffness, symptoms  rapidly improved, labs were reassuring CMP with elevated glucose otherwise unremarkable, unremarkable CBC, she woke up with transient left leg numbness, weakness, she reported this is very similar to what happened following extubation, symptoms she reported were worsening of symptoms she experienced in the past and still had to certain extent, over several hours went back to baseline, no new symptoms, examination showed faint weakness with dorsiflexion of her left ankle, she was back to baseline, reflexes were symmetric, examination was also reassuring, I offered her MRI of the brain but they thought this is really an 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    HPI 02/24/2016:  Megan Hull is a 80 y.o. female here as a referral from Dr. Theda Sers for left foot drop. past medical history of hypertension and cervical degenerative disc disease, hyperlipidemia, supraventricular tachycardia, asthma, prediabetes, vitamin D deficiency, sciatica left leg.Patient had no issues walking and then 3-4 years ago her leg was numb and it improved and no issues since then more recently earlier this month she was hospitalized and intubated for angioedema and when she was aware she could not feel the left leg when she woke up again she could talk and she could not feel her left leg from the calf down. She could not move the leg like it died. While in the hospital some sensation improved had some tingling. Now she feels like shocks in the leg below the  knee, and pain more tightness and can;t stretch it. If she holds it in one position a lot it becomes worse, moving it helps, changing positions, no new pain, she has had foot swelling which is not new, no improvement in strength, can't move the foot or ankle at all, it "just lays there". She is wearing an orthosis, no falls and she could not walk due to foot drop. She has not been to physical therapy. No pain in the thigh. Starts below the knee int he calf. Numbness is  on the lateral leg and onto the top of the toes mostly. No back pain, no radicular symptoms. No FHx of neuropathy. No other associated symptoms or focal neurolgic deficits.   Reviewed notes, labs and imaging from outside physicians, which showed:   Reviewed notes from [facility] physicians. Patient has sciatic left leg and left leg foot drop.  Personally reviewed images of the brain and lumbar spine and agree with the following 01/26/2016: No diffusion restriction to suggest acute infarct. No abnormal susceptibility hypointensity to indicate intracranial hemorrhage. No significant T2 FLAIR signal abnormality. No focal mass effect.  Extra-axial space: Normal ventricular size. No midline shift. No effacement of basilar cisterns. No extra-axial collection is identified. Proximal intracranial flow voids are maintained. No abnormality of the cervical medullary junction.  Other: Mucosal thickening within the left maxillary sinus, anterior ethmoid sinuses, bilateral sphenoid sinuses, and left maxillary sinus mucous retention cyst. Minimal opacification of the left mastoid tip. Orbits are unremarkable. Calvarium is unremarkable.  Conus medullaris: Extends to the L1-2 level and appears normal.  Paraspinal and other soft tissues: Negative.  Disc levels: Multilevel disc desiccation and mild disc space narrowing of the L4-5 and L5-S1. S2 subcentimeter Tarlov cyst.  L1-2: No significant disc displacement, foraminal narrowing, or canal stenosis.  L2-3: Minimal disc bulge without significant foraminal narrowing or canal stenosis.  L3-4: Small disc bulge and mild bilateral facet hypertrophy, right greater than left. Mild right foraminal narrowing. No significant canal stenosis.  L4-5: Small disc bulge and left-greater-than-right moderate facet and flavum hypertrophy. Trace bilateral facet effusions. Mild right and moderate left foraminal narrowing. Slight narrowing of the left lateral recess. No  significant canal stenosis.  L5-S1: Small disc bulge and moderate bilateral facet and flavum hypertrophy. Trace right facet effusion. 8 mm T2 hyperintense structure directed posteriorly from the left facet joint into paraspinal muscles is likely a synovial cyst. Mild right and moderate left foraminal narrowing. No significant canal stenosis.  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 Trace lower lumbar degenerative facet effusions.  IMPRESSION: No acute intracranial abnormality is identified. No evidence of PRES. Moderate paranasal sinus disease.  Evaluated by neurology during recent hospitalization. Reviewed all hospitalization notes. She presented to the ED with swollen tongue. Angioedema required intubation. patient was extubated on 8/14. At that time she expressed she had weakness and numbness of her left LE. Patient was intubated on 8/11. She stated that she noted upon waking that she felt tingling and numbness in her left leg from her knee down. She stated that she was trying to tell the nurses however due to being intubated she could not express herself. She does recall stain on her left side for the majority of the time in ICU. On extubation she was finally able to explain that she could not feel or move her left leg. Initially it was a complete numbness. At this point she states she feels a  burning and tingling sensation. In fact she states it is more sensitive on the left than the right. However, she still has no movement in her left foot and ankle. She states she has never had this issue before. She improved in the hospital to no sensation to feeling pins and needles. On exam she was not able to dorsiflex plantarflex even greater invert her left ankle. 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 Recommended EMG nerve conduction study.  CBC with mild anemia hct 11.9, bmp  with elevated glucose and calcium normal creatinine.    REVIEW OF SYSTEMS: Out of a complete 14 system review of symptoms, the patient complains only of the following symptoms, back pain and all other reviewed systems are negative.   ALLERGIES: Allergies  Allergen Reactions  . Nexium [Esomeprazole Magnesium] Anaphylaxis    Pt states she was unable to swallow for several hours after taking the nexium  . Other Hives, Shortness Of Breath and Other (See Comments)    Red Fish (Hives) Bojangle's chicken (swelling and shortness of breath) PAPRIKA  . Codeine Other (See Comments)    REACTION: chest pain  . Ace Inhibitors Swelling    Angioedema   . Latex Other (See Comments)    Causes skin to turn red per patient   . Monosodium Glutamate Other (See Comments)    dizziness  . Amlodipine Other (See Comments)    Lower extremity swelling, itching  . Shellfish Allergy Hives  . Tape Rash    Coban wrap turned her arm red (after stress test)     HOME MEDICATIONS: Outpatient Medications Prior to Visit  Medication Sig Dispense Refill  . albuterol (VENTOLIN HFA) 108 (90 Base) MCG/ACT inhaler     . Alum & Mag Hydroxide-Simeth (ANTACID ADVANCED PO) Take 1 tablet by mouth daily as needed (indigestion).     . budesonide (PULMICORT) 0.25 MG/2ML nebulizer solution Apply 2 mL to your nasal saline irrigation once per day.    . carvedilol (COREG) 25 MG tablet Take 1 tablet (25 mg total) by mouth 2 (two) times daily with a meal. Patient needs to schedule 6 month follow up for further refills 180 tablet 2  . cetirizine (ZYRTEC) 10 MG tablet Take 10 mg by mouth daily.     . Cholecalciferol (VITAMIN D3 PO) Take 50 mg by mouth daily.    . diphenhydrAMINE (BENADRYL ALLERGY) 25 MG tablet Take 2 tablets by mouth as needed for anaphylaxis.    [name] EPINEPHrine 0.3 mg/0.3 mL IJ SOAJ injection Inject 0.3 mLs (0.3 mg total) into the muscle once as needed (anaphylaxis). 2 Device 0  . furosemide (LASIX) 40 MG tablet TAKE 1  TABLET IN THE MORNING AND TAKE 1/2 TABLET IN THE EVENING ( TAKE 1 AND 1/2 TABLETS EVERY DAY ) 130 tablet 3  . hydrALAZINE (APRESOLINE) 25 MG tablet TAKE 1 TABLET (25 MG TOTAL) BY MOUTH 3 (THREE) TIMES DAILY AS NEEDED (FOR SBP GREATER THAN 150). NEEDS MD APPOINTMENT 270 tablet 1  . isosorbide mononitrate (IMDUR) 30 MG 24 hr tablet TAKE 1 TABLET EVERY DAY 90 tablet 3  . montelukast (SINGULAIR) 10 MG tablet Take 10 mg by mouth at bedtime.    . Multiple Vitamins-Minerals (AIRBORNE PO) Take 4 tablets by mouth daily.    [name] neomycin-polymyxin-hydrocortisone (CORTISPORIN) 3.5-10000-1 ophthalmic suspension Apply 1-2 drops to the affected toes BID after soaking 7.5 mL 1  . Probiotic Product (PROBIOTIC DAILY PO) Take by mouth.    . simvastatin (ZOCOR) 40 MG tablet  TAKE 1 TABLET EVERY DAY 90 tablet 3  . vitamin C (ASCORBIC ACID) 500 MG tablet Take 500 mg by mouth daily.     No facility-administered medications prior to visit.     PAST MEDICAL HISTORY: Past Medical History:  Diagnosis Date  . Arthritis   . CHF (congestive heart failure) ([place])   . Diabetes mellitus without complication (HCC)    borderline  . Diarrhea   . Diverticulosis   . Dysrhythmia    controlled with Metoprolol-12-19-13 LOV -Dr. Gwenlyn Found sees yearly  . Family history of premature CAD   . GERD (gastroesophageal reflux disease)   . Heart murmur    yrs ago  . HLD (hyperlipidemia)    pt reports cholesterol normal now (07/21/19).  [name] Hypertension   . Lower extremity edema   . Status post dilation of esophageal narrowing   . Supraventricular arrhythmia   . Tobacco abuse      PAST SURGICAL HISTORY: Past Surgical History:  Procedure Laterality Date  . ANTERIOR CERVICAL DECOMP/DISCECTOMY FUSION N/A 06/08/2015   Procedure: CERVICAL FIVE-SIX ,CERVICAL SIX-SEVEN ANTERIOR CERVICAL DECOMPRESSION/DISCECTOMY FUSION PLATING BONEGRAFT;  Surgeon: Kristeen Miss, MD;  Location: [facility];  Service: Neurosurgery;  Laterality: N/A;  . 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 Adora Fridge)  . CATARACT EXTRACTION, BILATERAL     most recent 11/19  . COLONOSCOPY    . ESOPHAGEAL DILATION  2014  . infected lymph node surgery  80's  . NM MYOCAR PERF WALL MOTION  04/2011   bruce myoview; normal pattern of perfusion, EF 80%, low risk scan  . SINUS ENDO WITH FUSION Bilateral 03/04/2020   Procedure: SINUS ENDOSCOPY WITH FUSION NAVIGATION;  Surgeon: Jerrell Belfast, MD;  Location: [facility];  Service: ENT;  Laterality: Bilateral;  . TONSILLECTOMY  70's  . TRANSTHORACIC ECHOCARDIOGRAM  12/2007   mild conc LVH  . TURBINATE REDUCTION Bilateral 03/04/2020   Procedure: TURBINATE REDUCTION;  Surgeon: Jerrell Belfast, MD;  Location: [facility];  Service: ENT;  Laterality: Bilateral;     FAMILY HISTORY: Family History  Problem Relation Age of Onset  . Heart disease Brother        MI @ 42, HTN @ 20, DM @ 41   . Heart disease Father        MI  . Heart disease Mother        MI, CVA @ 15  . Heart disease Sister        CHF, CVA  . Breast cancer Sister 46  . Esophageal cancer Brother   . Alzheimer's disease Sister   . Heart disease Brother        MI in 102s, lung cancer  . Heart disease Sister   . Hypertension Sister        x2  . Hyperlipidemia Sister        x2  . Diabetes Sister        x2  . Cancer Sister   . Hypertension Brother   . Colon cancer Neg Hx      SOCIAL HISTORY: Social History   Socioeconomic History  . Marital status: Single    Spouse name: Not on file  . Number of children: 0  . Years of education: 12+  . Highest education level: Not on file  Occupational History  . Occupation: [employer]  Tobacco Use  . Smoking status: Former Smoker    Packs/day: 0.25    Years: 15.00  Pack years: 3.75    Types: Cigarettes    Quit date: 02/04/2016    Years since quitting: 4.3  . Smokeless tobacco: Never Used  Vaping Use  . Vaping Use: Never used   Substance and Sexual Activity  . Alcohol use: Not Currently    Comment: occasional - tequila shots on holidays  . Drug use: No  . Sexual activity: Not on file  Other Topics Concern  . Not on file  Social History Narrative   Lives alone    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)   Social Determinants of Health   Financial Resource Strain: Not on file  Food Insecurity: Not on file  Transportation Needs: Not on file  Physical Activity: Not on file  Stress: Not on file  Social Connections: Not on file  Intimate Partner Violence: Not on file      PHYSICAL EXAM  Vitals:   06/17/20 1345  BP: 126/75  Pulse: 70  Weight: 175 lb (79.4 kg)  Height: 5\' 5"  (1.651 m)   Body mass index is 29.12 kg/m.   Generalized: Well developed, in no acute distress  Cardiology: normal rate and rhythm, no murmur auscultated  Respiratory: clear to auscultation bilaterally    Neurological examination  Mentation: Alert oriented to time, place, history taking. Follows all commands speech and language fluent Cranial nerve II-XII: Pupils were equal round reactive to light. Extraocular movements were full, visual field were full on confrontational test. Facial sensation and strength were normal. Head turning and shoulder shrug  were normal and symmetric. Motor: The motor testing reveals 5 over 5 strength of all 4 extremities. Good symmetric motor tone is noted throughout.  Sensory: Sensory testing is intact to soft touch on all 4 extremities. No evidence of extinction is noted.  Gait and station: Gait is normal.  Reflexes: Deep tendon reflexes are symmetric and normal bilaterally.     DIAGNOSTIC DATA (LABS, IMAGING, TESTING) - I reviewed patient records, labs, notes, testing and imaging myself where available.  Lab Results  Component Value Date   WBC 3.9 (L) 06/28/2019   HGB 12.8 06/28/2019   HCT 40.0 06/28/2019   MCV 94.8 06/28/2019   PLT 209 06/28/2019      Component  Value Date/Time   NA 138 03/02/2020 1200   NA 141 05/28/2017 1222   K 4.0 03/02/2020 1200   CL 102 03/02/2020 1200   CO2 28 03/02/2020 1200   GLUCOSE 116 (H) 03/02/2020 1200   BUN 17 03/02/2020 1200   BUN 16 05/28/2017 1222   CREATININE 0.99 03/02/2020 1200   CREATININE 0.90 11/16/2016 1335   CALCIUM 8.8 (L) 03/02/2020 1200   PROT 6.5 06/28/2019 1203   ALBUMIN 3.4 (L) 06/28/2019 1203   AST 29 06/28/2019 1203   ALT 12 06/28/2019 1203   ALKPHOS 50 06/28/2019 1203   BILITOT 0.8 06/28/2019 1203   GFRNONAA 54 (L) 03/02/2020 1200   GFRNONAA 62 11/16/2016 1335   GFRAA >60 03/02/2020 1200   GFRAA 72 11/16/2016 1335   Lab Results  Component Value Date   CHOL 129 01/04/2016   HDL 51 01/04/2016   LDLCALC 57 01/04/2016   TRIG 104 01/04/2016   CHOLHDL 2.5 01/04/2016   Lab Results  Component Value Date   HGBA1C (H) 01/14/2009    6.3 (NOTE) The ADA recommends the following therapeutic goal for glycemic control related to Hgb A1c measurement: Goal of therapy: <6.5 Hgb A1c  Reference: American Diabetes Association: Clinical Practice Recommendations  2010, Diabetes Care, 2010, 33: (Suppl  1).   No results found for: VITAMINB12   No flowsheet data found.   ASSESSMENT AND PLAN  80 y.o. year old female  has a past medical history of Arthritis, CHF (congestive heart failure) ([place]), Diabetes mellitus without complication ([place]), Diarrhea, Diverticulosis, Dysrhythmia, Family history of premature CAD, GERD (gastroesophageal reflux disease), Heart murmur, HLD (hyperlipidemia), Hypertension, Lower extremity edema, Status post dilation of esophageal narrowing, Supraventricular arrhythmia, and Tobacco abuse. here with   Neuropathy of left sciatic nerve  Yanill was seen in the ER 12/22 and diagnosed with recurrence of left sciatica. Fortunately, diazepam was effective in relieving pain and symptoms have resolved. No abnormalities on exam today. She is requesting to go back to PT as this was very  beneficial for her with last exacerbation. She was referred to a spine specialist in [place] but does not wish to drive that far to be seen. I have offered to refer her to [facility], Dr Brien Few, but she declines. She may call should she change her mind. Healthy lifestyle habits encouraged. She may use diazepam 4mg  twice daily as needed should pain return. Caution against regular use advised and side effects reviewed. She will follow up as needed.   No orders of the defined types were placed in this encounter.     I spent 20 minutes of face-to-face and non-face-to-face time with patient.  This included previsit chart review, lab review, study review, order entry, electronic health record documentation, patient education.    Debbora Presto, MSN, FNP-C 06/17/2020, 1:51 PM  [facility] [address], [place], [place] [address] [phone]  Made any corrections needed, and agree with history, physical, neuro exam,assessment and plan as stated.     Sarina Ill, MD [facility]

## 2020-06-16 NOTE — Patient Instructions (Signed)
Below is our plan:  We will continue to monitor symptoms. I am so glad you are not in any pain. I will send a referral to PT to help with strengthening and exercises if pain were to return. If you wish, I can send you to a spine specialist in out area to help with pain management if pain returns. You may use diazepam 4mg  twice daily as needed but please avoid regular use.   Please make sure you are staying well hydrated. I recommend 50-60 ounces daily. Well balanced diet and regular exercise encouraged.    Please continue follow up with care team as directed.   Follow up US as needed  You may receive a survey regarding today's visit. I encourage you to leave honest feed back as I do use this information to improve patient care. Thank you for seeing me today!      Sciatica  Sciatica is pain, weakness, tingling, or loss of feeling (numbness) along the sciatic nerve. The sciatic nerve starts in the lower back and goes down the back of each leg. Sciatica usually goes away on its own or with treatment. Sometimes, sciatica may come back (recur). What are the causes? This condition happens when the sciatic nerve is pinched or has pressure put on it. This may be the result of:  A disk in between the bones of the spine bulging out too far (herniated disk).  Changes in the spinal disks that occur with aging.  A condition that affects a muscle in the butt.  Extra bone growth near the sciatic nerve.  A break (fracture) of the area between your hip bones (pelvis).  Pregnancy.  Tumor. This is rare. What increases the risk? You are more likely to develop this condition if you:  Play sports that put pressure or stress on the spine.  Have poor strength and ease of movement (flexibility).  Have had a back injury in the past.  Have had back surgery.  Sit for long periods of time.  Do activities that involve bending or lifting over and over again.  Are very overweight (obese). What  are the signs or symptoms? Symptoms can vary from mild to very bad. They may include:  Any of these problems in the lower back, leg, hip, or butt: ? Mild tingling, loss of feeling, or dull aches. ? Burning sensations. ? Sharp pains.  Loss of feeling in the back of the calf or the sole of the foot.  Leg weakness.  Very bad back pain that makes it hard to move. These symptoms may get worse when you cough, sneeze, or laugh. They may also get worse when you sit or stand for long periods of time. How is this treated? This condition often gets better without any treatment. However, treatment may include:  Changing or cutting back on physical activity when you have pain.  Doing exercises and stretching.  Putting ice or heat on the affected area.  Medicines that help: ? To relieve pain and swelling. ? To relax your muscles.  Shots (injections) of medicines that help to relieve pain, irritation, and swelling.  Surgery. Follow these instructions at home: Medicines  Take over-the-counter and prescription medicines only as told by your doctor.  Ask your doctor if the medicine prescribed to you: ? Requires you to avoid driving or using heavy machinery. ? Can cause trouble pooping (constipation). You may need to take these steps to prevent or treat trouble pooping:  Drink enough fluids to keep your  pee (urine) pale yellow.  Take over-the-counter or prescription medicines.  Eat foods that are high in fiber. These include beans, whole grains, and fresh fruits and vegetables.  Limit foods that are high in fat and sugar. These include fried or sweet foods. Managing pain      If told, put ice on the affected area. ? Put ice in a plastic bag. ? Place a towel between your skin and the bag. ? Leave the ice on for 20 minutes, 2-3 times a day.  If told, put heat on the affected area. Use the heat source that your doctor tells you to use, such as a moist heat pack or a heating  pad. ? Place a towel between your skin and the heat source. ? Leave the heat on for 20-30 minutes. ? Remove the heat if your skin turns bright red. This is very important if you are unable to feel pain, heat, or cold. You may have a greater risk of getting burned. Activity   Return to your normal activities as told by your doctor. Ask your doctor what activities are safe for you.  Avoid activities that make your symptoms worse.  Take short rests during the day. ? When you rest for a long time, do some physical activity or stretching between periods of rest. ? Avoid sitting for a long time without moving. Get up and move around at least one time each hour.  Exercise and stretch regularly, as told by your doctor.  Do not lift anything that is heavier than 10 lb (4.5 kg) while you have symptoms of sciatica. ? Avoid lifting heavy things even when you do not have symptoms. ? Avoid lifting heavy things over and over.  When you lift objects, always lift in a way that is safe for your body. To do this, you should: ? Bend your knees. ? Keep the object close to your body. ? Avoid twisting. General instructions  Stay at a healthy weight.  Wear comfortable shoes that support your feet. Avoid wearing high heels.  Avoid sleeping on a mattress that is too soft or too hard. You might have less pain if you sleep on a mattress that is firm enough to support your back.  Keep all follow-up visits as told by your doctor. This is important. Contact a doctor if:  You have pain that: ? Wakes you up when you are sleeping. ? Gets worse when you lie down. ? Is worse than the pain you have had in the past. ? Lasts longer than 4 weeks.  You lose weight without trying. Get help right away if:  You cannot control when you pee (urinate) or poop (have a bowel movement).  You have weakness in any of these areas and it gets worse: ? Lower back. ? The area between your hip  bones. ? Butt. ? Legs.  You have redness or swelling of your back.  You have a burning feeling when you pee. Summary  Sciatica is pain, weakness, tingling, or loss of feeling (numbness) along the sciatic nerve.  This condition happens when the sciatic nerve is pinched or has pressure put on it.  Sciatica can cause pain, tingling, or loss of feeling (numbness) in the lower back, legs, hips, and butt.  Treatment often includes rest, exercise, medicines, and putting ice or heat on the affected area. This information is not intended to replace advice given to you by your health care provider. Make sure you discuss any questions you have  with your health care provider. Document Revised: 06/17/2018 Document Reviewed: 06/17/2018 Elsevier Patient Education  2020 [employer].

## 2020-06-17 ENCOUNTER — Encounter: Payer: Self-pay | Admitting: Family Medicine

## 2020-06-17 ENCOUNTER — Ambulatory Visit: Payer: Medicare HMO | Admitting: Family Medicine

## 2020-06-17 VITALS — BP 126/75 | HR 70 | Ht 65.0 in | Wt 175.0 lb

## 2020-06-17 DIAGNOSIS — G5702 Lesion of sciatic nerve, left lower limb: Secondary | ICD-10-CM

## 2020-06-25 ENCOUNTER — Encounter: Payer: Self-pay | Admitting: Emergency Medicine

## 2020-06-25 ENCOUNTER — Ambulatory Visit
Admission: EM | Admit: 2020-06-25 | Discharge: 2020-06-25 | Disposition: A | Discharge status: Home / Self Care | Payer: Medicare HMO | Attending: Emergency Medicine | Admitting: Emergency Medicine

## 2020-06-25 ENCOUNTER — Other Ambulatory Visit: Payer: Self-pay

## 2020-06-25 DIAGNOSIS — Z1152 Encounter for screening for COVID-19: Secondary | ICD-10-CM | POA: Insufficient documentation

## 2020-06-25 DIAGNOSIS — R0602 Shortness of breath: Secondary | ICD-10-CM | POA: Insufficient documentation

## 2020-06-25 DIAGNOSIS — J069 Acute upper respiratory infection, unspecified: Secondary | ICD-10-CM | POA: Diagnosis not present

## 2020-06-25 MED ORDER — BENZONATATE 100 MG PO CAPS: 200.0000 mg | ORAL_CAPSULE | Freq: Three times a day (TID) | ORAL | 0 refills | Status: DC

## 2020-06-25 MED ORDER — AEROCHAMBER MV MISC: 2 refills | Status: DC

## 2020-06-25 MED ORDER — PROMETHAZINE-DM 6.25-15 MG/5ML PO SYRP: 5.0000 mL | ORAL_SOLUTION | Freq: Four times a day (QID) | ORAL | 0 refills | Status: DC | PRN

## 2020-06-25 MED ORDER — ALBUTEROL SULFATE HFA 108 (90 BASE) MCG/ACT IN AERS: 2.0000 | INHALATION_SPRAY | RESPIRATORY_TRACT | 0 refills | Status: DC | PRN

## 2020-06-25 NOTE — ED Triage Notes (Signed)
Pt states that she has a cough and SOB that started Wednesday night. Pt states that she tried OTC medication but nothing has helped.

## 2020-06-25 NOTE — Discharge Instructions (Addendum)
Isolate at home until the results of your COVID test are back.  If you are positive then you will need to quarantine for 5 days from when your symptoms started.  After the 5 days you can break quarantine if your symptoms have improved and you have not had a fever for 24 hours.  Use the albuterol inhaler with a spacer, 2 puffs every 4-6 hours, as needed for shortness of breath and wheezing.  Use the Tessalon Perles during the day for cough and the Promethazine DM cough syrup at bedtime as needed for cough and congestion.  If you develop any increase in your shortness of breath, you cannot catch her breath at rest, you cannot speak in full sentences, or as a late sign your lips started turning blue you need to go to the ER for evaluation.

## 2020-06-25 NOTE — ED Provider Notes (Signed)
[facility]    CSN: [id number] Arrival date & time: 06/25/20  1401      History   Chief Complaint Chief Complaint  Patient presents with  . Cough  . Shortness of Breath    HPI Megan Hull is a 80 y.o. female.   HPI   80 year old female here for evaluation of cough and shortness of breath that been going on for last 2 days.  Patient reports that she is also had associated symptoms of runny nose, right ear pain, productive cough that is worse at night, shortness of breath and wheezing.  Patient denies fever, sore throat, GI complaints, body aches, or changes to her sense of taste or smell.  She does not have any known COVID exposures.  Patient has been vaccinated and boosted to COVID and has had her flu shot.  Past Medical History:  Diagnosis Date  . Arthritis   . CHF (congestive heart failure) ([place])   . Diabetes mellitus without complication (HCC)    borderline  . Diarrhea   . Diverticulosis   . Dysrhythmia    controlled with Metoprolol-12-19-13 LOV -Dr. Gwenlyn Found sees yearly  . Family history of premature CAD   . GERD (gastroesophageal reflux disease)   . Heart murmur    yrs ago  . HLD (hyperlipidemia)    pt reports cholesterol normal now (07/21/19).  [name] Hypertension   . Lower extremity edema   . Status post dilation of esophageal narrowing   . Supraventricular arrhythmia   . Tobacco abuse     Patient Active Problem List   Diagnosis Date Noted  . Sinusitis, chronic 03/04/2020  . Chest pain 05/21/2018  . SVT (supraventricular tachycardia) ([place]) 05/21/2018  . Acute sinusitis 01/24/2018  . Chronic diastolic (congestive) heart failure ([place]) 05/31/2017  . Left leg weakness 02/10/2016  . Left foot drop 02/10/2016  . DJD (degenerative joint disease) 01/26/2016  . History of angioedema 01/21/2016  . Cervical spondylosis with myelopathy and radiculopathy 06/08/2015  . Coronary artery disease 12/18/2014  . Esophageal stricture 04/23/2014  . Lower  extremity numbness 08/11/2013  . Essential hypertension 05/15/2013  . Dyslipidemia 05/15/2013  . Dyspnea 05/15/2013  . Lower extremity edema 05/15/2013  . Diarrhea 02/12/2013  . Esophageal reflux 01/20/2010  . Dysphagia 01/20/2010    Past Surgical History:  Procedure Laterality Date  . ANTERIOR CERVICAL DECOMP/DISCECTOMY FUSION N/A 06/08/2015   Procedure: CERVICAL FIVE-SIX ,CERVICAL SIX-SEVEN ANTERIOR CERVICAL DECOMPRESSION/DISCECTOMY FUSION PLATING BONEGRAFT;  Surgeon: Kristeen Miss, MD;  Location: [facility];  Service: Neurosurgery;  Laterality: N/A;  . 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 Adora Fridge)  . CATARACT EXTRACTION, BILATERAL     most recent 11/19  . COLONOSCOPY    . ESOPHAGEAL DILATION  2014  . infected lymph node surgery  80's  . NM MYOCAR PERF WALL MOTION  04/2011   bruce myoview; normal pattern of perfusion, EF 80%, low risk scan  . SINUS ENDO WITH FUSION Bilateral 03/04/2020   Procedure: SINUS ENDOSCOPY WITH FUSION NAVIGATION;  Surgeon: Jerrell Belfast, MD;  Location: [facility];  Service: ENT;  Laterality: Bilateral;  . TONSILLECTOMY  70's  . TRANSTHORACIC ECHOCARDIOGRAM  12/2007   mild conc LVH  . TURBINATE REDUCTION Bilateral 03/04/2020   Procedure: TURBINATE REDUCTION;  Surgeon: Jerrell Belfast, MD;  Location: [facility];  Service: ENT;  Laterality: Bilateral;    OB History   No obstetric history on file.  Home Medications    Prior to Admission medications   Medication Sig Start Date End Date Taking? Authorizing Provider  benzonatate (TESSALON) 100 MG capsule Take 2 capsules (200 mg total) by mouth every 8 (eight) hours. 06/25/20  Yes Margarette Canada, NP  promethazine-dextromethorphan (PROMETHAZINE-DM) 6.25-15 MG/5ML syrup Take 5 mLs by mouth 4 (four) times daily as needed. 06/25/20  Yes Margarette Canada, NP  Spacer/Aero-Holding Josiah Lobo (AEROCHAMBER MV) inhaler Use as instructed 06/25/20   Yes Margarette Canada, NP  albuterol (VENTOLIN HFA) 108 (90 Base) MCG/ACT inhaler Inhale 2 puffs into the lungs every 4 (four) hours as needed for wheezing or shortness of breath. 06/25/20   Margarette Canada, NP  Alum & Mag Hydroxide-Simeth (ANTACID ADVANCED PO) Take 1 tablet by mouth daily as needed (indigestion).     [provider]  budesonide (PULMICORT) 0.25 MG/2ML nebulizer solution Apply 2 mL to your nasal saline irrigation once per day. 09/01/19   [provider]  carvedilol (COREG) 25 MG tablet Take 1 tablet (25 mg total) by mouth 2 (two) times daily with a meal. Patient needs to schedule 6 month follow up for further refills 11/27/19   Lorretta Harp, MD  cephALEXin (KEFLEX) 250 MG capsule Take 250 mg by mouth 4 (four) times daily. 06/10/20   [provider]  cetirizine (ZYRTEC) 10 MG tablet Take 10 mg by mouth daily.    [provider]  Cholecalciferol (VITAMIN D3 PO) Take 50 mg by mouth daily.    [provider]  diphenhydrAMINE (BENADRYL) 25 MG tablet Take 2 tablets by mouth as needed for anaphylaxis.    [provider]  EPINEPHrine 0.3 mg/0.3 mL IJ SOAJ injection Inject 0.3 mLs (0.3 mg total) into the muscle once as needed (anaphylaxis). 12/11/15   Patrecia Pour, MD  furosemide (LASIX) 40 MG tablet TAKE 1 TABLET IN THE MORNING AND TAKE 1/2 TABLET IN THE EVENING ( TAKE 1 AND 1/2 TABLETS EVERY DAY ) 01/29/20   Lorretta Harp, MD  hydrALAZINE (APRESOLINE) 25 MG tablet TAKE 1 TABLET (25 MG TOTAL) BY MOUTH 3 (THREE) TIMES DAILY AS NEEDED (FOR SBP GREATER THAN 150). NEEDS MD APPOINTMENT 06/01/20   Lorretta Harp, MD  isosorbide mononitrate (IMDUR) 30 MG 24 hr tablet TAKE 1 TABLET EVERY DAY 01/29/20   Lorretta Harp, MD  montelukast (SINGULAIR) 10 MG tablet Take 10 mg by mouth at bedtime.    [provider]  Multiple Vitamins-Minerals (AIRBORNE PO) Take 4 tablets by mouth daily.    [provider]  Probiotic Product (PROBIOTIC  DAILY PO) Take by mouth.    [provider]  simvastatin (ZOCOR) 40 MG tablet TAKE 1 TABLET EVERY DAY 06/01/20   Lorretta Harp, MD  vitamin C (ASCORBIC ACID) 500 MG tablet Take 500 mg by mouth daily.    [provider]    Family History Family History  Problem Relation Age of Onset  . Heart disease Brother        MI @ 24, HTN @ 9, DM @ 68   . Heart disease Father        MI  . Heart disease Mother        MI, CVA @ 85  . Heart disease Sister        CHF, CVA  . Breast cancer Sister 46  . Esophageal cancer Brother   . Alzheimer's disease Sister   . Heart disease Brother        MI in 37s,  lung cancer  . Heart disease Sister   . Hypertension Sister        x2  . Hyperlipidemia Sister        x2  . Diabetes Sister        x2  . Cancer Sister   . Hypertension Brother   . Colon cancer Neg Hx     Social History Social History   Tobacco Use  . Smoking status: Former Smoker    Packs/day: 0.25    Years: 15.00    Pack years: 3.75    Types: Cigarettes    Quit date: 02/04/2016    Years since quitting: 4.3  . Smokeless tobacco: Never Used  Vaping Use  . Vaping Use: Never used  Substance Use Topics  . Alcohol use: Not Currently    Comment: occasional - tequila shots on holidays  . Drug use: No     Allergies   Nexium [esomeprazole magnesium], Other, Codeine, Ace inhibitors, Latex, Monosodium glutamate, Amlodipine, Shellfish allergy, and Tape   Review of Systems Review of Systems  Constitutional: Negative for activity change, appetite change, chills and fever.  HENT: Positive for congestion, ear pain and rhinorrhea. Negative for sore throat.   Respiratory: Positive for cough, shortness of breath and wheezing.   Gastrointestinal: Negative for diarrhea, nausea and vomiting.  Musculoskeletal: Negative for arthralgias and myalgias.  Skin: Negative for rash.  Hematological: Negative.   Psychiatric/Behavioral: Negative.      Physical Exam Triage Vital  Signs ED Triage Vitals  Enc Vitals Group     BP 06/25/20 1519 (!) 123/58     Pulse Rate 06/25/20 1519 73     Resp 06/25/20 1519 18     Temp 06/25/20 1519 98.2 F (36.8 C)     Temp Source 06/25/20 1519 Oral     SpO2 06/25/20 1519 95 %     Weight --      Height --      Head Circumference --      Peak Flow --      Pain Score 06/25/20 1517 0     Pain Loc --      Pain Edu? --      Excl. in [place]? --    No data found.  Updated Vital Signs BP (!) 123/58 (BP Location: Left Arm)   Pulse 73   Temp 98.2 F (36.8 C) (Oral)   Resp 18   SpO2 95%   Visual Acuity Right Eye Distance:   Left Eye Distance:   Bilateral Distance:    Right Eye Near:   Left Eye Near:    Bilateral Near:     Physical Exam Vitals and nursing note reviewed.  Constitutional:      General: She is not in acute distress.    Appearance: Normal appearance. She is normal weight. She is not toxic-appearing.  HENT:     Head: Normocephalic and atraumatic.     Right Ear: Tympanic membrane, ear canal and external ear normal.     Left Ear: Tympanic membrane, ear canal and external ear normal.     Nose: Congestion and rhinorrhea present.     Comments: Nasal mucosa is edematous with clear nasal discharge.    Mouth/Throat:     Mouth: Mucous membranes are moist.     Pharynx: Oropharynx is clear. No posterior oropharyngeal erythema.  Cardiovascular:     Rate and Rhythm: Normal rate and regular rhythm.     Pulses: Normal pulses.     Heart  sounds: Normal heart sounds. No murmur heard. No gallop.   Pulmonary:     Effort: Pulmonary effort is normal.     Breath sounds: Normal breath sounds. No wheezing, rhonchi or rales.  Musculoskeletal:     Cervical back: Normal range of motion and neck supple.  Lymphadenopathy:     Cervical: No cervical adenopathy.  Skin:    General: Skin is warm and dry.     Capillary Refill: Capillary refill takes less than 2 seconds.     Findings: No erythema or rash.  Neurological:      General: No focal deficit present.     Mental Status: She is alert and oriented to person, place, and time.  Psychiatric:        Mood and Affect: Mood normal.        Behavior: Behavior normal.        Thought Content: Thought content normal.        Judgment: Judgment normal.      UC Treatments / Results  Labs (all labs ordered are listed, but only abnormal results are displayed) Labs Reviewed  SARS CORONAVIRUS 2 (TAT 6-24 HRS)    EKG   Radiology No results found.  Procedures Procedures (including critical care time)  Medications Ordered in UC Medications - No data to display  Initial Impression / Assessment and Plan / UC Course  I have reviewed the triage vital signs and the nursing notes.  Pertinent labs & imaging results that were available during my care of the patient were reviewed by me and considered in my medical decision making (see chart for details).   Patient is here for evaluation of cough and shortness of breath and been gone for the past 2 days.  Patient is in no acute distress and is able to speak in full sentences.  Patient's lung sounds are clear to auscultation in all fields.  Patient reports that her coughing is mostly worse at night and first thing in the morning.  Patient has been fully vaccinated and boosted against COVID, and has had her flu vaccine, but will swab for COVID, give albuterol inhaler with spacer for shortness of breath, Tessalon Perles and Promethazine DM for cough and congestion.   Final Clinical Impressions(s) / UC Diagnoses   Final diagnoses:  Encounter for screening for COVID-19  Shortness of breath  Viral URI with cough     Discharge Instructions     Isolate at home until the results of your COVID test are back.  If you are positive then you will need to quarantine for 5 days from when your symptoms started.  After the 5 days you can break quarantine if your symptoms have improved and you have not had a fever for 24  hours.  Use the albuterol inhaler with a spacer, 2 puffs every 4-6 hours, as needed for shortness of breath and wheezing.  Use the Tessalon Perles during the day for cough and the Promethazine DM cough syrup at bedtime as needed for cough and congestion.  If you develop any increase in your shortness of breath, you cannot catch her breath at rest, you cannot speak in full sentences, or as a late sign your lips started turning blue you need to go to the ER for evaluation.    ED Prescriptions    Medication Sig Dispense Auth. Provider   albuterol (VENTOLIN HFA) 108 (90 Base) MCG/ACT inhaler Inhale 2 puffs into the lungs every 4 (four) hours as needed for wheezing or  shortness of breath. 18 g Margarette Canada, NP   Spacer/Aero-Holding Chambers (AEROCHAMBER MV) inhaler Use as instructed 1 each Margarette Canada, NP   benzonatate (TESSALON) 100 MG capsule Take 2 capsules (200 mg total) by mouth every 8 (eight) hours. 21 capsule Margarette Canada, NP   promethazine-dextromethorphan (PROMETHAZINE-DM) 6.25-15 MG/5ML syrup Take 5 mLs by mouth 4 (four) times daily as needed. 118 mL Margarette Canada, NP     PDMP not reviewed this encounter.   Margarette Canada, NP 06/25/20 [phone]

## 2020-06-26 LAB — SARS CORONAVIRUS 2 (TAT 6-24 HRS): SARS Coronavirus 2: NEGATIVE

## 2020-06-30 ENCOUNTER — Telehealth: Payer: Self-pay | Admitting: Family Medicine

## 2020-06-30 NOTE — Telephone Encounter (Signed)
Patient called she wants to hold off on Physical Therapy for Now her back is doing better .

## 2020-06-30 NOTE — Telephone Encounter (Signed)
Noted. TY. She may call if she changes her mind.

## 2020-07-05 DIAGNOSIS — J45901 Unspecified asthma with (acute) exacerbation: Secondary | ICD-10-CM | POA: Diagnosis not present

## 2020-07-05 DIAGNOSIS — R059 Cough, unspecified: Secondary | ICD-10-CM | POA: Diagnosis not present

## 2020-07-12 DIAGNOSIS — J324 Chronic pansinusitis: Secondary | ICD-10-CM | POA: Diagnosis not present

## 2020-07-12 DIAGNOSIS — J339 Nasal polyp, unspecified: Secondary | ICD-10-CM | POA: Diagnosis not present

## 2020-07-21 ENCOUNTER — Ambulatory Visit
Admission: EM | Admit: 2020-07-21 | Discharge: 2020-07-21 | Disposition: A | Discharge status: Home / Self Care | Payer: Medicare HMO | Attending: Family Medicine | Admitting: Family Medicine

## 2020-07-21 ENCOUNTER — Other Ambulatory Visit: Payer: Self-pay

## 2020-07-21 DIAGNOSIS — Z20822 Contact with and (suspected) exposure to covid-19: Secondary | ICD-10-CM | POA: Insufficient documentation

## 2020-07-21 NOTE — ED Triage Notes (Signed)
Patient here for COVID testing, no symptoms

## 2020-07-21 NOTE — Discharge Instructions (Signed)

## 2020-07-22 LAB — SARS CORONAVIRUS 2 (TAT 6-24 HRS): SARS Coronavirus 2: NEGATIVE

## 2020-07-29 DIAGNOSIS — I471 Supraventricular tachycardia: Secondary | ICD-10-CM | POA: Diagnosis not present

## 2020-07-29 DIAGNOSIS — E119 Type 2 diabetes mellitus without complications: Secondary | ICD-10-CM | POA: Diagnosis not present

## 2020-07-29 DIAGNOSIS — E7800 Pure hypercholesterolemia, unspecified: Secondary | ICD-10-CM | POA: Diagnosis not present

## 2020-08-05 DIAGNOSIS — I519 Heart disease, unspecified: Secondary | ICD-10-CM | POA: Diagnosis not present

## 2020-08-05 DIAGNOSIS — M47816 Spondylosis without myelopathy or radiculopathy, lumbar region: Secondary | ICD-10-CM | POA: Diagnosis not present

## 2020-08-05 DIAGNOSIS — E119 Type 2 diabetes mellitus without complications: Secondary | ICD-10-CM | POA: Diagnosis not present

## 2020-08-05 DIAGNOSIS — J329 Chronic sinusitis, unspecified: Secondary | ICD-10-CM | POA: Diagnosis not present

## 2020-08-05 DIAGNOSIS — I2510 Atherosclerotic heart disease of native coronary artery without angina pectoris: Secondary | ICD-10-CM | POA: Diagnosis not present

## 2020-08-05 DIAGNOSIS — E785 Hyperlipidemia, unspecified: Secondary | ICD-10-CM | POA: Diagnosis not present

## 2020-08-05 DIAGNOSIS — Z0000 Encounter for general adult medical examination without abnormal findings: Secondary | ICD-10-CM | POA: Diagnosis not present

## 2020-08-05 DIAGNOSIS — J45909 Unspecified asthma, uncomplicated: Secondary | ICD-10-CM | POA: Diagnosis not present

## 2020-08-05 DIAGNOSIS — Z8679 Personal history of other diseases of the circulatory system: Secondary | ICD-10-CM | POA: Diagnosis not present

## 2020-08-06 NOTE — Progress Notes (Signed)
Cardiology Clinic Note   Patient Name: Megan Hull Date of Encounter: 08/09/2020  Primary Care Provider:  [name], DO Primary Cardiologist:  Quay Burow, MD  Patient Profile    Megan Hull 80 year old female presents the clinic today for follow-up evaluation of her coronary artery disease, chronic diastolic CHF, and hypertension.  Past Medical History    Past Medical History:  Diagnosis Date  . Arthritis   . CHF (congestive heart failure) ([place])   . Diabetes mellitus without complication (HCC)    borderline  . Diarrhea   . Diverticulosis   . Dysrhythmia    controlled with Metoprolol-12-19-13 LOV -Dr. Gwenlyn Found sees yearly  . Family history of premature CAD   . GERD (gastroesophageal reflux disease)   . Heart murmur    yrs ago  . HLD (hyperlipidemia)    pt reports cholesterol normal now (07/21/19).  [name] Hypertension   . Lower extremity edema   . Status post dilation of esophageal narrowing   . Supraventricular arrhythmia   . Tobacco abuse    Past Surgical History:  Procedure Laterality Date  . ANTERIOR CERVICAL DECOMP/DISCECTOMY FUSION N/A 06/08/2015   Procedure: CERVICAL FIVE-SIX ,CERVICAL SIX-SEVEN ANTERIOR CERVICAL DECOMPRESSION/DISCECTOMY FUSION PLATING BONEGRAFT;  Surgeon: Kristeen Miss, MD;  Location: [facility];  Service: Neurosurgery;  Laterality: N/A;  . 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 Adora Fridge)  . CATARACT EXTRACTION, BILATERAL     most recent 11/19  . COLONOSCOPY    . ESOPHAGEAL DILATION  2014  . infected lymph node surgery  80's  . NM MYOCAR PERF WALL MOTION  04/2011   bruce myoview; normal pattern of perfusion, EF 80%, low risk scan  . SINUS ENDO WITH FUSION Bilateral 03/04/2020   Procedure: SINUS ENDOSCOPY WITH FUSION NAVIGATION;  Surgeon: Jerrell Belfast, MD;  Location: [facility];  Service: ENT;  Laterality: Bilateral;  . TONSILLECTOMY  70's  . TRANSTHORACIC  ECHOCARDIOGRAM  12/2007   mild conc LVH  . TURBINATE REDUCTION Bilateral 03/04/2020   Procedure: TURBINATE REDUCTION;  Surgeon: Jerrell Belfast, MD;  Location: [facility];  Service: ENT;  Laterality: Bilateral;    Allergies  Allergies  Allergen Reactions  . Nexium [Esomeprazole Magnesium] Anaphylaxis    Pt states she was unable to swallow for several hours after taking the nexium  . Other Hives, Shortness Of Breath and Other (See Comments)    Red Fish (Hives) Bojangle's chicken (swelling and shortness of breath) PAPRIKA  . Codeine Other (See Comments)    REACTION: chest pain  . Ace Inhibitors Swelling    Angioedema   . Latex Other (See Comments)    Causes skin to turn red per patient   . Monosodium Glutamate Other (See Comments)    dizziness  . Amlodipine Other (See Comments)    Lower extremity swelling, itching  . Shellfish Allergy Hives  . Tape Rash    Coban wrap turned her arm red (after stress test)    History of Present Illness    [name] has a PMH of essential hypertension, coronary artery disease, chronic diastolic CHF, SVT, esophageal reflux, DJD, dyslipidemia, lower extremity numbness, lower extremity edema, dyspnea, and left foot drop.  Cardiac catheterization 812 showed 60% mid LAD narrowing.  Angioedema secondary to ACE inhibitor.  She was  seen by Kerin Ransom, PA-C on 07/30/2019.  During that time it was noted that she had presented to the emergency department 12/19 with symptoms  of substernal chest pain.  She was noted to have an episode of SVT while sitting on her couch watching football.  The SVT was broken with a Valsalva maneuver, and was followed by palpitations and SS CP.  It was noted that she had previously had palpitations and ectopy however had not had any history of SVT.  She is a retired Programmer, multimedia at [employer] and has some medical and cardiology knowledge.  She indicated that she can tell when her blood  pressure is elevated due to left neck pain.  She was seen by Dr. Gwenlyn Found 12/20.  An echocardiogram 12/19 and nuclear stress test 1/20 after her complaints of neck pain and dyspnea on exertion showed low risk.  Her echocardiogram showed an LVEF of 60-65%, mild LVH and G1 DD.  She was also evaluated by pulmonary and was not felt to have contributed COPD.  When she last presented to the clinic on 07/30/2019 she brought blood pressure readings that showed consistent elevated systolic blood pressure [id number] systolic.  She brought her blood pressure monitor from home which showed an increase in systolic pressure by around 40 points.  Her blood pressure in the office was 120s over 60s.  She was grateful to discover this.  She was doing well from a cardiac standpoint.  She continued to have chronic dyspnea on exertion she had been prescribed albuterol however had not used the medication.  She had not had any recurrent episodes of PSVT.  She presented to the clinic 12/03/2019 for follow-up evaluation and stated she felt well.  She stated that she  had episodes where her blood pressure was in the 115's over 60s and 70s and she had taken her hydralazine which further dropped down her blood pressure and maked her feel not well.  She requested that she have 25 mg tablets to be able to take in the evening when her blood pressure is lower.  She continuee to be very physically active in her church and followed a heart healthy diet.  She brought a blood pressure log  that showed well-controlled blood pressure.  She stated that she had an appointment with Dr. Gwenlyn Found in August for clearance for her sinus surgery.  I  gave her a prescription for an additional 25 mg of hydralazine, gave her the salty 6 diet sheet, and had her follow-up as scheduled.  She was seen by Dr. Gwenlyn Found 02/10/2020 in follow-up.  She continued to do well.  She did complain of allergies and sinus drainage.  Her sinus surgery was discussed and deemed to be a  safe procedure from a cardiac standpoint.  Her blood pressure was well managed at that time.  She presents the clinic today for follow-up evaluation states around 1 month ago she had asthma exacerbation that required nebulizer treatments and steroids.  She reports that the treatment greatly improved her breathing.  However currently she is still working to get over her exacerbation.  She is now taking nebulizer treatments when she notices wheezing.  She is managing her blood pressure with her hydralazine.  Her carvedilol was held by her PCP and she is taking 12.5 mg when her heart rate increases to 95.  I agree with this change at the current time.  I will have her continue her current medication regimen.  She reports that she is going to [place] for the entire month of May and she will not be available for follow-up until the beginning of June.  I  will give her the salty 6 diet sheet, have her maintain her blood pressure log and follow-up early June.  We will repeat her fasting lipid panel.  Today she denies chest pain, shortness of breath, lower extremity edema, fatigue, melena, hematuria, hemoptysis, diaphoresis, weakness, presyncope, syncope, orthopnea, and PND.  Home Medications    Prior to Admission medications   Medication Sig Start Date End Date Taking? Authorizing Provider  albuterol (VENTOLIN HFA) 108 (90 Base) MCG/ACT inhaler Inhale 2 puffs into the lungs every 4 (four) hours as needed for wheezing or shortness of breath. 06/25/20   Margarette Canada, NP  Alum & Mag Hydroxide-Simeth (ANTACID ADVANCED PO) Take 1 tablet by mouth daily as needed (indigestion).     [provider]  benzonatate (TESSALON) 100 MG capsule Take 2 capsules (200 mg total) by mouth every 8 (eight) hours. 06/25/20   Margarette Canada, NP  budesonide (PULMICORT) 0.25 MG/2ML nebulizer solution Apply 2 mL to your nasal saline irrigation once per day. 09/01/19   [provider]  carvedilol (COREG) 25 MG tablet Take 1  tablet (25 mg total) by mouth 2 (two) times daily with a meal. Patient needs to schedule 6 month follow up for further refills 11/27/19   Lorretta Harp, MD  cephALEXin (KEFLEX) 250 MG capsule Take 250 mg by mouth 4 (four) times daily. 06/10/20   [provider]  cetirizine (ZYRTEC) 10 MG tablet Take 10 mg by mouth daily.    [provider]  Cholecalciferol (VITAMIN D3 PO) Take 50 mg by mouth daily.    [provider]  diphenhydrAMINE (BENADRYL) 25 MG tablet Take 2 tablets by mouth as needed for anaphylaxis.    [provider]  EPINEPHrine 0.3 mg/0.3 mL IJ SOAJ injection Inject 0.3 mLs (0.3 mg total) into the muscle once as needed (anaphylaxis). 12/11/15   Patrecia Pour, MD  furosemide (LASIX) 40 MG tablet TAKE 1 TABLET IN THE MORNING AND TAKE 1/2 TABLET IN THE EVENING ( TAKE 1 AND 1/2 TABLETS EVERY DAY ) 01/29/20   Lorretta Harp, MD  hydrALAZINE (APRESOLINE) 25 MG tablet TAKE 1 TABLET (25 MG TOTAL) BY MOUTH 3 (THREE) TIMES DAILY AS NEEDED (FOR SBP GREATER THAN 150). NEEDS MD APPOINTMENT 06/01/20   Lorretta Harp, MD  isosorbide mononitrate (IMDUR) 30 MG 24 hr tablet TAKE 1 TABLET EVERY DAY 01/29/20   Lorretta Harp, MD  montelukast (SINGULAIR) 10 MG tablet Take 10 mg by mouth at bedtime.    [provider]  Multiple Vitamins-Minerals (AIRBORNE PO) Take 4 tablets by mouth daily.    [provider]  Probiotic Product (PROBIOTIC DAILY PO) Take by mouth.    [provider]  promethazine-dextromethorphan (PROMETHAZINE-DM) 6.25-15 MG/5ML syrup Take 5 mLs by mouth 4 (four) times daily as needed. 06/25/20   Margarette Canada, NP  simvastatin (ZOCOR) 40 MG tablet TAKE 1 TABLET EVERY DAY 06/01/20   Lorretta Harp, MD  Spacer/Aero-Holding Chambers (AEROCHAMBER MV) inhaler Use as instructed 06/25/20   Margarette Canada, NP  vitamin C (ASCORBIC ACID) 500 MG tablet Take 500 mg by mouth daily.    [provider]    Family History    Family  History  Problem Relation Age of Onset  . Heart disease Brother        MI @ 25, HTN @ 75, DM @ 62   . Heart disease Father        MI  . Heart disease Mother  MI, CVA @ 54  . Heart disease Sister        CHF, CVA  . Breast cancer Sister 14  . Esophageal cancer Brother   . Alzheimer's disease Sister   . Heart disease Brother        MI in 75s, lung cancer  . Heart disease Sister   . Hypertension Sister        x2  . Hyperlipidemia Sister        x2  . Diabetes Sister        x2  . Cancer Sister   . Hypertension Brother   . Colon cancer Neg Hx    She indicated that her mother is deceased. She indicated that her father is deceased. She indicated that two of her ten sisters are alive. She indicated that only one of her six brothers is alive. She indicated that her maternal grandmother is deceased. She indicated that her maternal grandfather is deceased. She indicated that her paternal grandmother is deceased. She indicated that her paternal grandfather is deceased. She indicated that the status of her neg hx is unknown.  Social History    Social History   Socioeconomic History  . Marital status: Single    Spouse name: Not on file  . Number of children: 0  . Years of education: 12+  . Highest education level: Not on file  Occupational History  . Occupation: [employer]  Tobacco Use  . Smoking status: Former Smoker    Packs/day: 0.25    Years: 15.00    Pack years: 3.75    Types: Cigarettes    Quit date: 02/04/2016    Years since quitting: 4.5  . Smokeless tobacco: Never Used  Vaping Use  . Vaping Use: Never used  Substance and Sexual Activity  . Alcohol use: Not Currently    Comment: occasional - tequila shots on holidays  . Drug use: No  . Sexual activity: Not on file  Other Topics Concern  . Not on file  Social History Narrative   Lives alone    Caffeine use: 2 cups coffee per day (mixed decaf/regular)   Social Determinants of Health   Financial Resource Strain:  Not on file  Food Insecurity: Not on file  Transportation Needs: Not on file  Physical Activity: Not on file  Stress: Not on file  Social Connections: Not on file  Intimate Partner Violence: Not on file     Review of Systems    General:  No chills, fever, night sweats or weight changes.  Cardiovascular:  No chest pain, dyspnea on exertion, edema, orthopnea, palpitations, paroxysmal nocturnal dyspnea. Dermatological: No rash, lesions/masses Respiratory: No cough, dyspnea Urologic: No hematuria, dysuria Abdominal:   No nausea, vomiting, diarrhea, bright red blood per rectum, melena, or hematemesis Neurologic:  No visual changes, wkns, changes in mental status. All other systems reviewed and are otherwise negative except as noted above.  Physical Exam    VS:  BP (!) 142/60 (BP Location: Left Arm, Patient Position: Sitting, Cuff Size: Normal)   Pulse 96   Ht 5\' 5"  (1.651 m)   Wt 177 lb 3.2 oz (80.4 kg)   BMI 29.49 kg/m  , BMI Body mass index is 29.49 kg/m. GEN: Well nourished, well developed, in no acute distress. HEENT: normal. Neck: Supple, no JVD, carotid bruits, or masses. Cardiac: RRR, no murmurs, rubs, or gallops. No clubbing, cyanosis, edema.  Radials/DP/PT 2+ and equal bilaterally.  Respiratory:  Respirations regular and unlabored, clear to auscultation  bilaterally. GI: Soft, nontender, nondistended, BS + x 4. MS: no deformity or atrophy. Skin: warm and dry, no rash. Neuro:  Strength and sensation are intact. Psych: Normal affect.  Accessory Clinical Findings    Recent Labs: 03/02/2020: BUN 17; Creatinine, Ser 0.99; Potassium 4.0; Sodium 138   Recent Lipid Panel    Component Value Date/Time   CHOL 129 01/04/2016 0853   TRIG 104 01/04/2016 0853   HDL 51 01/04/2016 0853   CHOLHDL 2.5 01/04/2016 0853   VLDL 21 01/04/2016 0853   LDLCALC 57 01/04/2016 0853    ECG personally reviewed by me today-normal sinus rhythm possible inferior infarct undetermined age 72  bpm- No acute changes   EKG 02/10/2020 sinus rhythm 81 bpm no ST or T wave changes  EKG 07/30/2019 Normal sinus rhythm 71 bpm  Echocardiogram 06/06/2018 Study Conclusions   - Left ventricle: The cavity size was normal. There was mild  concentric hypertrophy. Systolic function was normal. The  estimated ejection fraction was in the range of 60% to 65%. Wall  motion was normal; there were no regional wall motion  abnormalities. Doppler parameters are consistent with abnormal  left ventricular relaxation (grade 1 diastolic dysfunction).  - Mitral valve: Calcified annulus. Mildly thickened leaflets .  - Atrial septum: No defect or patent foramen ovale was identified.  Nuclear stress test 06/14/2018  Nuclear stress EF: 73%.  The left ventricular ejection fraction is hyperdynamic (>65%).  There was no ST segment deviation noted during stress.  The study is normal.  This is a low risk study.  Normal stress nuclear study with no ischemia or infarction. Gated ejection fraction 73% with normal wall motion.  Assessment & Plan   1.  Chronic diastolic CHF-euvolemic.    Improving shortness of breath with asthma. Nuclear stress test shows EF of 73 on 06/14/2018. Continue furosemide, carvedilol, Imdur, hydralazine Heart healthy low-sodium diet-salty 6 given Increase physical activity as tolerated  Essential hypertension-BP today 142/60.    Well-controlled at home. Continue Imdur, hydralazine, carvedilol, furosemide Heart healthy low-sodium diet-salty 6 given Increase physical activity as tolerated  Coronary artery disease-denies chest pain today.  Cardiac catheterization 8/12 showed 60% mid LAD stenosis.  Nuclear stress test 1/20 showed low risk and no ischemia. Continue carvedilol, aspirin, Imdur, simvastatin Heart healthy low-sodium diet-salty 6 given Increase physical activity as tolerated  Hyperlipidemia-LDL 68 on 07/29/20  Continue simvastatin Heart healthy  low-sodium high-fiber diet Increase physical activity as tolerated   Paroxysmal SVT-no recent palpitations.    EKG today shows normal sinus rhythm possible inferior infarct 96 bpm.  Occasional  episodes of palpitations or heart flutter. Continue carvedilol Heart healthy low-sodium diet Avoid triggers caffeine, chocolate, EtOH etc.  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 Episode required 48 hours of intubation.  Allergy testing subsequently showed allergies to ARB/ACE and red meat.     Disposition: Follow-up with Dr. Gwenlyn Found in 3 months.   Jossie Ng. Asser Lucena NP-C    08/09/2020, 10:14 AM [address] Office [phone] Fax [phone]  Notice: This dictation was prepared with Dragon dictation along with smaller phrase technology. Any transcriptional errors that result from this process are unintentional and may not be corrected upon review.  I spent 15 minutes examining this patient, reviewing medications, and using patient centered shared decision making involving her cardiac care.  Prior to her visit I spent greater than 20 minutes reviewing her past medical history,  medications, and prior cardiac tests.

## 2020-08-09 ENCOUNTER — Ambulatory Visit: Payer: Medicare HMO | Admitting: General Practice

## 2020-08-09 ENCOUNTER — Other Ambulatory Visit: Payer: Self-pay

## 2020-08-09 ENCOUNTER — Encounter: Payer: Self-pay | Admitting: General Practice

## 2020-08-09 VITALS — BP 142/60 | HR 96 | Ht 65.0 in | Wt 177.2 lb

## 2020-08-09 DIAGNOSIS — E785 Hyperlipidemia, unspecified: Secondary | ICD-10-CM | POA: Diagnosis not present

## 2020-08-09 DIAGNOSIS — Z79899 Other long term (current) drug therapy: Secondary | ICD-10-CM | POA: Diagnosis not present

## 2020-08-09 DIAGNOSIS — I5032 Chronic diastolic (congestive) heart failure: Secondary | ICD-10-CM | POA: Diagnosis not present

## 2020-08-09 DIAGNOSIS — I10 Essential (primary) hypertension: Secondary | ICD-10-CM

## 2020-08-09 DIAGNOSIS — I471 Supraventricular tachycardia: Secondary | ICD-10-CM | POA: Diagnosis not present

## 2020-08-09 MED ORDER — CARVEDILOL 25 MG PO TABS: 25.0000 mg | ORAL_TABLET | Freq: Two times a day (BID) | ORAL | 2 refills | Status: DC

## 2020-08-09 NOTE — Patient Instructions (Signed)
Medication Instructions:  TAKE CARVEDILOL 12.5MG  if HR >95 *If you need a refill on your cardiac medications before your next appointment, please call your pharmacy*  Lab Work: [facility] If you have labs (blood work) drawn today and your tests are completely normal, you will receive your results only by:  [facility] (if you have MyChart) OR A paper copy in the mail.  If you have any lab test that is abnormal or we need to change your treatment, we will call you to review the results. You may go to any [facility] that is convenient for you however, we do have a lab in our office that is able to assist you. You DO NOT need an appointment for our lab. The lab is open 8:00am and closes at 4:00pm. Lunch 12:45 - 1:45pm.  Special Instructions TAKE AND LOG YOUR BLOOD PRESSURE AND BRING TO YOUR F/U APPOINTMENT  Follow-Up: Your next appointment:  3 month(s) In Person with Quay Burow, MD OR IF UNAVAILABLE JESSE CLEAVER, FNP-C  At [facility], you and your health needs are our priority.  As part of our continuing mission to provide you with exceptional heart care, we have created designated Provider Care Teams.  These Care Teams include your primary Cardiologist (physician) and Advanced Practice Providers (APPs -  Physician Assistants and Nurse Practitioners) who all work together to provide you with the care you need, when you need it.

## 2020-08-23 ENCOUNTER — Institutional Professional Consult (permissible substitution): Payer: Medicare HMO | Admitting: Neurology

## 2020-08-28 DIAGNOSIS — I110 Hypertensive heart disease with heart failure: Secondary | ICD-10-CM | POA: Diagnosis not present

## 2020-08-28 DIAGNOSIS — R0602 Shortness of breath: Secondary | ICD-10-CM | POA: Diagnosis not present

## 2020-08-28 DIAGNOSIS — I4949 Other premature depolarization: Secondary | ICD-10-CM | POA: Diagnosis not present

## 2020-08-28 DIAGNOSIS — R918 Other nonspecific abnormal finding of lung field: Secondary | ICD-10-CM | POA: Diagnosis not present

## 2020-08-28 DIAGNOSIS — I509 Heart failure, unspecified: Secondary | ICD-10-CM | POA: Diagnosis not present

## 2020-08-28 DIAGNOSIS — E119 Type 2 diabetes mellitus without complications: Secondary | ICD-10-CM | POA: Diagnosis not present

## 2020-08-28 DIAGNOSIS — R2243 Localized swelling, mass and lump, lower limb, bilateral: Secondary | ICD-10-CM | POA: Diagnosis not present

## 2020-08-28 DIAGNOSIS — R0600 Dyspnea, unspecified: Secondary | ICD-10-CM | POA: Diagnosis not present

## 2020-08-28 DIAGNOSIS — E785 Hyperlipidemia, unspecified: Secondary | ICD-10-CM | POA: Diagnosis not present

## 2020-09-02 NOTE — Progress Notes (Signed)
Virtual Visit via Telephone Note   This visit type was conducted due to national recommendations for restrictions regarding the COVID-19 Pandemic (e.g. social distancing) in an effort to limit this patient's exposure and mitigate transmission in our community.  Due to her co-morbid illnesses, this patient is at least at moderate risk for complications without adequate follow up.  This format is felt to be most appropriate for this patient at this time.  The patient did not have access to video technology/had technical difficulties with video requiring transitioning to audio format only (telephone).  All issues noted in this document were discussed and addressed.  No physical exam could be performed with this format.  Please refer to the patient's chart for her  consent to telehealth for [facility].  Evaluation Performed:  Follow-up visit  This visit type was conducted due to national recommendations for restrictions regarding the COVID-19 Pandemic (e.g. social distancing).  This format is felt to be most appropriate for this patient at this time.  All issues noted in this document were discussed and addressed.  No physical exam was performed (except for noted visual exam findings with Video Visits).  Please refer to the patient's chart (MyChart message for video visits and phone note for telephone visits) for the patient's consent to telehealth for [facility]  Date:  09/03/2020   ID:  Megan Hull, DOB 03-14-1941, MRN [id number]  Patient Location:  [place] [address]   Provider location:     [address] Office [phone] Fax [phone]   PCP:  [name], DO  Cardiologist:  Quay Burow, MD  Electrophysiologist:  None   Chief Complaint: Follow-up for lower extremity edema  History of Present Illness:    Megan Hull is a 80 y.o. female who presents via audio/video  conferencing for a telehealth visit today.  Patient verified DOB and address.  Megan Hull has a PMH of essential hypertension, coronary artery disease, chronic diastolic CHF, SVT, esophageal reflux, DJD, dyslipidemia, lower extremity numbness, lower extremity edema, dyspnea, and left foot drop.  Cardiac catheterization 812 showed 60% mid LAD narrowing.  Angioedema secondary to ACE inhibitor.  She was  seen by Kerin Ransom, PA-C on 07/30/2019. During that time it was noted that she had presented to the emergency department 12/19 with symptoms of substernal chest pain. She was noted to have an episode of SVT while sitting on her couch watching football. The SVT was broken with a Valsalva maneuver, and was followed by palpitations and SS CP. It was noted that she had previously had palpitations and ectopy however had not had any history of SVT. She is a retired Programmer, multimedia at [employer] and has some medical and cardiology knowledge. She indicated that she can tell when her blood pressure is elevated due to left neck pain.  She was seen by Dr. Gwenlyn Found 12/20. An echocardiogram 12/19 and nuclear stress test 1/20 after her complaints of neck pain and dyspnea on exertion showed low risk. Her echocardiogram showed an LVEF of 60-65%, mild LVH and G1 DD. She was also evaluated by pulmonary and was not felt to have contributed COPD.  When she last presented to the clinic on 07/30/2019 she brought blood pressure readings that showed consistent elevated systolic blood pressure 016-010 systolic. She brought her blood pressure monitor from home which showed an increase in systolic pressure by around 40 points. Her blood pressure in  the office was 120s over 60s. She was grateful to discover this. She was doing well from a cardiac standpoint. She continued to have chronic dyspnea on exertion she had been prescribed albuterol however had not used the medication. She had not had any  recurrent episodes of PSVT.  She presented to the clinic 12/03/2019 for follow-up evaluation and statedshe felt well. She stated that she  had episodes where her blood pressure was in the 115's over 60s and 70s and she had taken her hydralazine which further dropped down her blood pressure and maked her feel not well. She requested that she have 25 mg tablets to be able to take in the evening when her blood pressure is lower. She continuee to be very physically active in her church and followed a heart healthy diet. She brought a blood pressure log  that showed well-controlled blood pressure. She stated that she had an appointment with Dr. Gwenlyn Found in August for clearance for her sinus surgery. I  gave her a prescription for an additional 25 mg of hydralazine, gave her the salty 6 diet sheet, and had her follow-up as scheduled.  She was seen by Dr. Gwenlyn Found 02/10/2020 in follow-up.  She continued to do well.  She did complain of allergies and sinus drainage.  Her sinus surgery was discussed and deemed to be a safe procedure from a cardiac standpoint.  Her blood pressure was well managed at that time.  She presented to the clinic 08/09/2020 for follow-up evaluation and stated around 1 month ago she had asthma exacerbation that required nebulizer treatments and steroids.  She reported that the treatment greatly improved her breathing.  However currently she was still working to get over her exacerbation.  She was taking nebulizer treatments when she noticed wheezing.  She was managing her blood pressure with her hydralazine.  Her carvedilol was held by her PCP and she was taking 12.5 mg when her heart rate increased to 95.  I agreed with the change at the current time.  I  had her continue her current medication regimen.  She reported that she was going to [place] for the entire month of May and she would not be available for follow-up until the beginning of June.  I  gave her the salty 6 diet sheet, had her  maintain her blood pressure log and follow-up early June.  Planned to repeat her fasting lipid panel at that time.  She presented to the [facility] emergency department 08/28/2020 with complaints of lower extremity swelling.  Her blood pressure was 140/74 with pulse of 67.  Her furosemide was increased to 40 mg twice daily x2 days.  She was then instructed to resume her 40 mg morning and 20 mg nightly dosing.  Lower extremity compression stockings, low-salt diet were reviewed.  She was given 40 mg IV Lasix x1.  CBC/CMP/troponins unremarkable.  She reported that her symptoms had improved and her neck pain had resolved after IV diuresis, lidocaine patch, and Tylenol.  She was discharged in stable condition.  She is seen virtually today in follow-up and states she feels well.  She reports that she was getting more salt in her diet.  We reviewed her emergency department visit.  Her weight is now back to baseline.  I will decrease her furosemide to 40 mg a.m. and 20 mg in the evening.  We will resume her carvedilol 12.5 mg twice daily and restart her 81 mg aspirin.  I have encouraged her to limit her  fluid to 48-64 ounces daily, continue her lower extremity support stockings, increase physical activity as tolerated and follow-up as scheduled  Today shedenies chest pain, shortness of breath, lower extremity edema, fatigue, melena, hematuria, hemoptysis, diaphoresis, weakness, presyncope, syncope, orthopnea, and PND.  The patient does not symptoms concerning for COVID-19 infection (fever, chills, cough, or new SHORTNESS OF BREATH).    Prior CV studies:   The following studies were reviewed today: EKG Aug 15, 2020 normal sinus rhythm possible inferior infarct undetermined age 56 bpm- No acute changes   EKG 02/10/2020 sinus rhythm 81 bpm no ST or T wave changes  EKG 07/30/2019 Normal sinus rhythm 71 bpm  Echocardiogram 06/06/2018 Study Conclusions   - Left ventricle: The cavity size was normal. There was  mild  concentric hypertrophy. Systolic function was normal. The  estimated ejection fraction was in the range of 60% to 65%. Wall  motion was normal; there were no regional wall motion  abnormalities. Doppler parameters are consistent with abnormal  left ventricular relaxation (grade 1 diastolic dysfunction).  - Mitral valve: Calcified annulus. Mildly thickened leaflets .  - Atrial septum: No defect or patent foramen ovale was identified.  Nuclear stress test 06/14/2018  Nuclear stress EF: 73%.  The left ventricular ejection fraction is hyperdynamic (>65%).  There was no ST segment deviation noted during stress.  The study is normal.  This is a low risk study.  Normal stress nuclear study with no ischemia or infarction. Gated ejection fraction 73% with normal wall motion.   Past Medical History:  Diagnosis Date  . Arthritis   . CHF (congestive heart failure) ([place])   . Diabetes mellitus without complication (HCC)    borderline  . Diarrhea   . Diverticulosis   . Dysrhythmia    controlled with Metoprolol-12-19-13 LOV -Dr. Gwenlyn Found sees yearly  . Family history of premature CAD   . GERD (gastroesophageal reflux disease)   . Heart murmur    yrs ago  . HLD (hyperlipidemia)    pt reports cholesterol normal now (07/21/19).  [name] Hypertension   . Lower extremity edema   . Status post dilation of esophageal narrowing   . Supraventricular arrhythmia   . Tobacco abuse    Past Surgical History:  Procedure Laterality Date  . ANTERIOR CERVICAL DECOMP/DISCECTOMY FUSION N/A 06/08/2015   Procedure: CERVICAL FIVE-SIX ,CERVICAL SIX-SEVEN ANTERIOR CERVICAL DECOMPRESSION/DISCECTOMY FUSION PLATING BONEGRAFT;  Surgeon: Kristeen Miss, MD;  Location: [place] [facility];  Service: Neurosurgery;  Laterality: N/A;  . 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 Adora Fridge)  . CATARACT EXTRACTION, BILATERAL     most recent 11/19  . COLONOSCOPY    . ESOPHAGEAL  DILATION  2014  . infected lymph node surgery  80's  . NM MYOCAR PERF WALL MOTION  04/2011   bruce myoview; normal pattern of perfusion, EF 80%, low risk scan  . SINUS ENDO WITH FUSION Bilateral 03/04/2020   Procedure: SINUS ENDOSCOPY WITH FUSION NAVIGATION;  Surgeon: Jerrell Belfast, MD;  Location: [facility];  Service: ENT;  Laterality: Bilateral;  . TONSILLECTOMY  70's  . TRANSTHORACIC ECHOCARDIOGRAM  12/2007   mild conc LVH  . TURBINATE REDUCTION Bilateral 03/04/2020   Procedure: TURBINATE REDUCTION;  Surgeon: Jerrell Belfast, MD;  Location: [place];  Service: ENT;  Laterality: Bilateral;     Current Meds  Medication Sig  . albuterol (VENTOLIN HFA) 108 (90 Base) MCG/ACT inhaler Inhale 2 puffs into the lungs every 4 (four) hours  as needed for wheezing or shortness of breath.  . Alum & Mag Hydroxide-Simeth (ANTACID ADVANCED PO) Take 1 tablet by mouth daily as needed (indigestion).   . budesonide (PULMICORT) 0.25 MG/2ML nebulizer solution 2 ml  . carvedilol (COREG) 25 MG tablet Take 1 tablet (25 mg total) by mouth 2 (two) times daily with a meal. Take if HR >95  . cetirizine (ZYRTEC) 10 MG tablet Take 10 mg by mouth daily.  . Cholecalciferol (VITAMIN D3 PO) Take 50 mg by mouth daily.  . diphenhydrAMINE (BENADRYL) 25 MG tablet Take 2 tablets by mouth as needed for anaphylaxis.  [name] EPINEPHrine 0.3 mg/0.3 mL IJ SOAJ injection Inject 0.3 mLs (0.3 mg total) into the muscle once as needed (anaphylaxis).  . furosemide (LASIX) 40 MG tablet Take 40 mg by mouth 2 (two) times daily.  . hydrALAZINE (APRESOLINE) 25 MG tablet TAKE 1 TABLET (25 MG TOTAL) BY MOUTH 3 (THREE) TIMES DAILY AS NEEDED (FOR SBP GREATER THAN 150). NEEDS MD APPOINTMENT  . isosorbide mononitrate (IMDUR) 30 MG 24 hr tablet TAKE 1 TABLET EVERY DAY  . montelukast (SINGULAIR) 10 MG tablet Take 10 mg by mouth at bedtime.  . Multiple Vitamins-Minerals (AIRBORNE PO) Take 1 tablet by mouth daily.  .  Probiotic Product (PROBIOTIC DAILY PO) Take by mouth.  . simvastatin (ZOCOR) 40 MG tablet TAKE 1 TABLET EVERY DAY  . Spacer/Aero-Holding Chambers (AEROCHAMBER MV) inhaler Use as instructed     Allergies:   Nexium [esomeprazole magnesium], Other, Codeine, Ace inhibitors, Latex, Monosodium glutamate, Amlodipine, Shellfish allergy, and Tape   Social History   Tobacco Use  . Smoking status: Former Smoker    Packs/day: 0.25    Years: 15.00    Pack years: 3.75    Types: Cigarettes    Quit date: 02/04/2016    Years since quitting: 4.5  . Smokeless tobacco: Never Used  Vaping Use  . Vaping Use: Never used  Substance Use Topics  . Alcohol use: Not Currently    Comment: occasional - tequila shots on holidays  . Drug use: No     Family Hx: The patient's family history includes Alzheimer's disease in her sister; Breast cancer (age of onset: 50) in her sister; Cancer in her sister; Diabetes in her sister; Esophageal cancer in her brother; Heart disease in her brother, brother, father, mother, sister, and sister; Hyperlipidemia in her sister; Hypertension in her brother and sister. There is no history of Colon cancer.  ROS:   Please see the history of present illness.     All other systems reviewed and are negative.   Labs/Other Tests and Data Reviewed:    Recent Labs: 03/02/2020: BUN 17; Creatinine, Ser 0.99; Potassium 4.0; Sodium 138   Recent Lipid Panel Lab Results  Component Value Date/Time   CHOL 129 01/04/2016 08:53 AM   TRIG 104 01/04/2016 08:53 AM   HDL 51 01/04/2016 08:53 AM   CHOLHDL 2.5 01/04/2016 08:53 AM   LDLCALC 57 01/04/2016 08:53 AM    Wt Readings from Last 3 Encounters:  09/03/20 175 lb (79.4 kg)  08/09/20 177 lb 3.2 oz (80.4 kg)  06/17/20 175 lb (79.4 kg)     Exam:    Vital Signs:  BP 140/72   Pulse 87   Ht 5' 5.5" (1.664 m)   Wt 175 lb (79.4 kg)   BMI 28.68 kg/m    Well nourished, well developed female in no  acute distress.   ASSESSMENT & PLAN:     1.  Chronic diastolic CHF-denies lower extremity edema.    Breathing continues to improve.Nuclear stress test shows EF of 73 on 06/14/2018. Continuefurosemide, carvedilol, Imdur, hydralazine Heart healthy low-sodium diet-salty 6 given Increase physical activity as tolerated Fluid restriction 60-48 ounces daily Daily weights-dry weight around 175 pounds  Essential hypertension-BP today 140/72 .   Well-controlled at home. ContinueImdur, hydralazine,  Resume furosemide 40 mg a.m. and 20 mg p.m. Resume carvedilol 12.5 mg twice daily Heart healthy low-sodium diet-salty 6 given Increase physical activity as tolerated  Coronary artery disease-no recent episodes of chest pain. Cardiac catheterization 8/12 showed 60% mid LAD stenosis. Nuclear stress test 1/20 showed low risk and no ischemia. Continuecarvedilol, aspirin, Imdur, simvastatin Heart healthy low-sodium diet-salty 6 given Increase physical activity as tolerated  Paroxysmal SVT-denies recent accelerated heartbeats or irregular beats.   Occasionalepisodes of palpitations or heart flutter. Continue carvedilol Heart healthy low-sodium diet Avoid triggers caffeine, chocolate, EtOH etc.  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 Episode required 48 hours of intubation. Allergy testing subsequently showed allergies to ARB/ACE and red meat.    Disposition: Follow-up with as scheduled.  COVID-19 Education: The signs and symptoms of COVID-19 were discussed with the patient and how to seek care for testing (follow up with PCP or arrange E-visit).  The importance of social distancing was discussed today.  Patient Risk:   After full review of this patients clinical status, I feel that they are at least moderate risk at this time.  Time:   Today, I have spent 12 minutes with the patient with telehealth technology discussing emergency department visit, medication, fluid restriction, edema, and  physical activity.     Medication Adjustments/Labs and Tests Ordered: Current medicines are reviewed at length with the patient today.  Concerns regarding medicines are outlined above.   Tests Ordered: No orders of the defined types were placed in this encounter.  Medication Changes: No orders of the defined types were placed in this encounter.   Disposition:  in 2 month(s)  Signed, Jossie Ng. Kostantinos Tallman NP-C    01/14/2019 11:58 AM    [address] Office [phone] Fax [phone]

## 2020-09-03 ENCOUNTER — Telehealth (INDEPENDENT_AMBULATORY_CARE_PROVIDER_SITE_OTHER): Payer: Medicare HMO | Admitting: General Practice

## 2020-09-03 ENCOUNTER — Encounter: Payer: Self-pay | Admitting: General Practice

## 2020-09-03 VITALS — BP 140/72 | HR 87 | Ht 65.5 in | Wt 175.0 lb

## 2020-09-03 DIAGNOSIS — I10 Essential (primary) hypertension: Secondary | ICD-10-CM | POA: Diagnosis not present

## 2020-09-03 DIAGNOSIS — I5032 Chronic diastolic (congestive) heart failure: Secondary | ICD-10-CM

## 2020-09-03 DIAGNOSIS — I471 Supraventricular tachycardia: Secondary | ICD-10-CM | POA: Diagnosis not present

## 2020-09-03 DIAGNOSIS — I2510 Atherosclerotic heart disease of native coronary artery without angina pectoris: Secondary | ICD-10-CM | POA: Diagnosis not present

## 2020-09-03 MED ORDER — FUROSEMIDE 40 MG PO TABS: ORAL_TABLET | ORAL | 3 refills | Status: DC

## 2020-09-03 MED ORDER — CARVEDILOL 12.5 MG PO TABS: 12.5000 mg | ORAL_TABLET | Freq: Two times a day (BID) | ORAL | 3 refills | Status: DC

## 2020-09-03 MED ORDER — ASPIRIN EC 81 MG PO TBEC: 81.0000 mg | DELAYED_RELEASE_TABLET | Freq: Every day | ORAL | 3 refills | Status: AC

## 2020-09-03 NOTE — Patient Instructions (Signed)
Medication Instructions:  Your physician has recommended you make the following change in your medication:   DECREASE: Carvedilol to 12.5mg  twice daily START: Aspirin 81mg  daily DECREASE: Furosemide to 40mg  in the morning and 20mg  in the evening.  *If you need a refill on your cardiac medications before your next appointment, please call your pharmacy*   Lab Work: None If you have labs (blood work) drawn today and your tests are completely normal, you will receive your results only by: [name] MyChart Message (if you have MyChart) OR . A paper copy in the mail If you have any lab test that is abnormal or we need to change your treatment, we will call you to review the results.   Follow-Up: At [facility], you and your health needs are our priority.  As part of our continuing mission to provide you with exceptional heart care, we have created designated Provider Care Teams.  These Care Teams include your primary Cardiologist (physician) and Advanced Practice Providers (APPs -  Physician Assistants and Nurse Practitioners) who all work together to provide you with the care you need, when you need it.  Your next appointment:   As scheduled  Other Instructions 1) Increase activity as tolerated 2) Wear support stockings 3) Fluid restriction 48-64oz daily  Low-Sodium Eating Plan Sodium, which is an element that makes up salt, helps you maintain a healthy balance of fluids in your body. Too much sodium can increase your blood pressure and cause fluid and waste to be held in your body. Your health care provider or dietitian may recommend following this plan if you have high blood pressure (hypertension), kidney disease, liver disease, or heart failure. Eating less sodium can help lower your blood pressure, reduce swelling, and protect your heart, liver, and kidneys. What are tips for following this plan? Reading food labels  The Nutrition Facts label lists the amount of sodium in one serving  of the food. If you eat more than one serving, you must multiply the listed amount of sodium by the number of servings.  Choose foods with less than 140 mg of sodium per serving.  Avoid foods with 300 mg of sodium or more per serving. Shopping  Look for lower-sodium products, often labeled as "low-sodium" or "no salt added."  Always check the sodium content, even if foods are labeled as "unsalted" or "no salt added."  Buy fresh foods. ? Avoid canned foods and pre-made or frozen meals. ? Avoid canned, cured, or processed meats.  Buy breads that have less than 80 mg of sodium per slice.   Cooking  Eat more home-cooked food and less restaurant, buffet, and fast food.  Avoid adding salt when cooking. Use salt-free seasonings or herbs instead of table salt or sea salt. Check with your health care provider or pharmacist before using salt substitutes.  Cook with plant-based oils, such as canola, sunflower, or olive oil.   Meal planning  When eating at a restaurant, ask that your food be prepared with less salt or no salt, if possible. Avoid dishes labeled as brined, pickled, cured, smoked, or made with soy sauce, miso, or teriyaki sauce.  Avoid foods that contain MSG (monosodium glutamate). MSG is sometimes added to [place] food, bouillon, and some canned foods.  Make meals that can be grilled, baked, poached, roasted, or steamed. These are generally made with less sodium. General information Most people on this plan should limit their sodium intake to 1,500-2,000 mg (milligrams) of sodium each day. What foods should  I eat? Fruits Fresh, frozen, or canned fruit. Fruit juice. Vegetables Fresh or frozen vegetables. "No salt added" canned vegetables. "No salt added" tomato sauce and paste. Low-sodium or reduced-sodium tomato and vegetable juice. Grains Low-sodium cereals, including oats, puffed wheat and rice, and shredded wheat. Low-sodium crackers. Unsalted rice. Unsalted pasta.  Low-sodium bread. Whole-grain breads and whole-grain pasta. Meats and other proteins Fresh or frozen (no salt added) meat, poultry, seafood, and fish. Low-sodium canned tuna and salmon. Unsalted nuts. Dried peas, beans, and lentils without added salt. Unsalted canned beans. Eggs. Unsalted nut butters. Dairy Milk. Soy milk. Cheese that is naturally low in sodium, such as ricotta cheese, fresh mozzarella, or Swiss cheese. Low-sodium or reduced-sodium cheese. Cream cheese. Yogurt. Seasonings and condiments Fresh and dried herbs and spices. Salt-free seasonings. Low-sodium mustard and ketchup. Sodium-free salad dressing. Sodium-free light mayonnaise. Fresh or refrigerated horseradish. Lemon juice. Vinegar. Other foods Homemade, reduced-sodium, or low-sodium soups. Unsalted popcorn and pretzels. Low-salt or salt-free chips. The items listed above may not be a complete list of foods and beverages you can eat. Contact a dietitian for more information. What foods should I avoid? Vegetables Sauerkraut, pickled vegetables, and relishes. Olives. [place] fries. Onion rings. Regular canned vegetables (not low-sodium or reduced-sodium). Regular canned tomato sauce and paste (not low-sodium or reduced-sodium). Regular tomato and vegetable juice (not low-sodium or reduced-sodium). Frozen vegetables in sauces. Grains Instant hot cereals. Bread stuffing, pancake, and biscuit mixes. Croutons. Seasoned rice or pasta mixes. Noodle soup cups. Boxed or frozen macaroni and cheese. Regular salted crackers. Self-rising flour. Meats and other proteins Meat or fish that is salted, canned, smoked, spiced, or pickled. Precooked or cured meat, such as sausages or meat loaves. Berniece Salines. Ham. Pepperoni. Hot dogs. Corned beef. Chipped beef. Salt pork. Jerky. Pickled herring. Anchovies and sardines. Regular canned tuna. Salted nuts. Dairy Processed cheese and cheese spreads. Hard cheeses. Cheese curds. Blue cheese. Feta cheese. String  cheese. Regular cottage cheese. Buttermilk. Canned milk. Fats and oils Salted butter. Regular margarine. Ghee. Bacon fat. Seasonings and condiments Onion salt, garlic salt, seasoned salt, table salt, and sea salt. Canned and packaged gravies. Worcestershire sauce. Tartar sauce. Barbecue sauce. Teriyaki sauce. Soy sauce, including reduced-sodium. Steak sauce. Fish sauce. Oyster sauce. Cocktail sauce. Horseradish that you find on the shelf. Regular ketchup and mustard. Meat flavorings and tenderizers. Bouillon cubes. Hot sauce. Pre-made or packaged marinades. Pre-made or packaged taco seasonings. Relishes. Regular salad dressings. Salsa. Other foods Salted popcorn and pretzels. Corn chips and puffs. Potato and tortilla chips. Canned or dried soups. Pizza. Frozen entrees and pot pies. The items listed above may not be a complete list of foods and beverages you should avoid. Contact a dietitian for more information. Summary  Eating less sodium can help lower your blood pressure, reduce swelling, and protect your heart, liver, and kidneys.  Most people on this plan should limit their sodium intake to 1,500-2,000 mg (milligrams) of sodium each day.  Canned, boxed, and frozen foods are high in sodium. Restaurant foods, fast foods, and pizza are also very high in sodium. You also get sodium by adding salt to food.  Try to cook at home, eat more fresh fruits and vegetables, and eat less fast food and canned, processed, or prepared foods. This information is not intended to replace advice given to you by your health care provider. Make sure you discuss any questions you have with your health care provider. Document Revised: 07/04/2019 Document Reviewed: 04/30/2019 Elsevier Patient Education  2021 [employer].

## 2020-09-03 NOTE — Addendum Note (Signed)
Addended by: Carylon Perches on: 09/03/2020 03:15 PM   Modules accepted: Orders

## 2020-10-12 ENCOUNTER — Telehealth: Payer: Self-pay | Admitting: Cardiovascular Disease

## 2020-10-12 DIAGNOSIS — R0600 Dyspnea, unspecified: Secondary | ICD-10-CM

## 2020-10-12 DIAGNOSIS — E785 Hyperlipidemia, unspecified: Secondary | ICD-10-CM

## 2020-10-12 DIAGNOSIS — R0609 Other forms of dyspnea: Secondary | ICD-10-CM

## 2020-10-12 MED ORDER — HYDRALAZINE HCL 25 MG PO TABS: ORAL_TABLET | ORAL | 0 refills | Status: DC

## 2020-10-12 NOTE — Telephone Encounter (Signed)
*  STAT* If patient is at the pharmacy, call can be transferred to refill team.   1. Which medications need to be refilled? (please list name of each medication and dose if known) hydrALAZINE (APRESOLINE) 25 MG tablet  2. Which pharmacy/location (including street and city if local pharmacy) is medication to be sent to? [facility]  [phone] [place] [address]  3. Do they need a 30 day or 90 day supply? 30  Patient is out of town in [place] and will be there for the remainder of the month. Patient forgot her medicine at home and is looking to have a refill sent to the pharmacy in [place]. Patient doesn't have any medication needs to be fill asap. Please call niece when prescription is ready [phone]

## 2020-11-15 NOTE — Progress Notes (Signed)
Cardiology Clinic Note   Patient Name: Megan Hull Date of Encounter: 11/17/2020  Primary Care Provider:  [name], DO Primary Cardiologist:  Megan Burow, MD  Patient Profile    Megan Hull 80 year old female presents the clinic today for follow-up evaluation of her chronic diastolic CHF.  Past Medical History    Past Medical History:  Diagnosis Date  . Arthritis   . CHF (congestive heart failure) ([place])   . Diabetes mellitus without complication (HCC)    borderline  . Diarrhea   . Diverticulosis   . Dysrhythmia    controlled with Metoprolol-12-19-13 LOV -Dr. Gwenlyn Hull sees yearly  . Family history of premature CAD   . GERD (gastroesophageal reflux disease)   . Heart murmur    yrs ago  . HLD (hyperlipidemia)    pt reports cholesterol normal now (07/21/19).  [name] Hypertension   . Lower extremity edema   . Status post dilation of esophageal narrowing   . Supraventricular arrhythmia   . Tobacco abuse    Past Surgical History:  Procedure Laterality Date  . ANTERIOR CERVICAL DECOMP/DISCECTOMY FUSION N/A 06/08/2015   Procedure: CERVICAL FIVE-SIX ,CERVICAL SIX-SEVEN ANTERIOR CERVICAL DECOMPRESSION/DISCECTOMY FUSION PLATING BONEGRAFT;  Surgeon: Kristeen Miss, MD;  Location: [facility];  Service: Neurosurgery;  Laterality: N/A;  . 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 Adora Hull)  . CATARACT EXTRACTION, BILATERAL     most recent 11/19  . COLONOSCOPY    . ESOPHAGEAL DILATION  2014  . infected lymph node surgery  80's  . NM MYOCAR PERF WALL MOTION  04/2011   bruce myoview; normal pattern of perfusion, EF 80%, low risk scan  . SINUS ENDO WITH FUSION Bilateral 03/04/2020   Procedure: SINUS ENDOSCOPY WITH FUSION NAVIGATION;  Surgeon: Jerrell Belfast, MD;  Location: [facility];  Service: ENT;  Laterality: Bilateral;  . TONSILLECTOMY  70's  . TRANSTHORACIC ECHOCARDIOGRAM  12/2007   mild conc LVH  . TURBINATE  REDUCTION Bilateral 03/04/2020   Procedure: TURBINATE REDUCTION;  Surgeon: Jerrell Belfast, MD;  Location: [facility];  Service: ENT;  Laterality: Bilateral;    Allergies  Allergies  Allergen Reactions  . Nexium [Esomeprazole Magnesium] Anaphylaxis    Pt states she was unable to swallow for several hours after taking the nexium  . Other Hives, Shortness Of Breath and Other (See Comments)    Red Fish (Hives) Bojangle's chicken (swelling and shortness of breath) PAPRIKA  . Codeine Other (See Comments)    REACTION: chest pain  . Ace Inhibitors Swelling    Angioedema   . Latex Other (See Comments)    Causes skin to turn red per patient   . Monosodium Glutamate Other (See Comments)    dizziness  . Amlodipine Other (See Comments)    Lower extremity swelling, itching  . Shellfish Allergy Hives  . Tape Rash    Coban wrap turned her arm red (after stress test)    History of Present Illness    [name] has a PMH of essential hypertension, coronary artery disease, chronic diastolic CHF, SVT, esophageal reflux, DJD, dyslipidemia, lower extremity numbness, lower extremity edema, dyspnea, and left foot drop. Cardiac catheterization 812 showed 60% mid LAD narrowing. Angioedema secondary to ACE inhibitor.  She was seen by Megan Ransom, PA-C on 07/30/2019. During that time it was noted that she had presented to the emergency department 12/19 with symptoms of substernal chest pain. She was noted to have  an episode of SVT while sitting on her couch watching football. The SVT was broken with a Valsalva maneuver, and was followed by palpitations and SS CP. It was noted that she had previously had palpitations and ectopy however had not had any history of SVT. She is a retired Programmer, multimedia at [employer] and has some medical and cardiology knowledge. She indicated that she can tell when her blood pressure is elevated due to left neck pain.  She was  seen by Dr. Gwenlyn Hull 12/20. An echocardiogram 12/19 and nuclear stress test 1/20 after her complaints of neck pain and dyspnea on exertion showed low risk. Her echocardiogram showed an LVEF of 60-65%, mild LVH and G1 DD. She was also evaluated by pulmonary and was not felt to have contributed COPD.  When she last presented to the clinic on 07/30/2019 she brought blood pressure readings that showed consistent elevated systolic blood pressure [id number] systolic. She brought her blood pressure monitor from home which showed an increase in systolic pressure by around 40 points. Her blood pressure in the office was 120s over 60s. She was grateful to discover this. She was doing well from a cardiac standpoint. She continued to have chronic dyspnea on exertion she had been prescribed albuterol however had not used the medication. She had not had any recurrent episodes of PSVT.  She presented tothe clinic 6/23/2021for follow-up evaluation and statedshe feltwell. She statedthat she had episodes where her blood pressure wasin the 115's over 60s and 70s and she hadtaken her hydralazine which further droppeddown her blood pressure and makedher feel not well. She requestedthat she have 25 mg tablets to be able to take in the evening when her blood pressure is lower. She continueeto be very physically active in her church and followeda heart healthy diet. She broughta blood pressure log that showedwell-controlled blood pressure. She statedthat she hadan appointment with Dr. Gwenlyn Hull in August for clearance for her sinus surgery. I gave her a prescription for an additional 25 mg of hydralazine, gave her the salty 6 diet sheet, and hadher follow-up as scheduled.  She was seen by Dr. Gwenlyn Hull 02/10/2020 in follow-up. She continued to do well. She did complain of allergies and sinus drainage. Her sinus surgery was discussed and deemed to be a safe procedure from a cardiac standpoint. Her blood  pressure was well managed at that time.  She presented to the clinic 08/09/2020 for follow-up evaluation and statedaround 1 month ago she had asthma exacerbation that required nebulizer treatments and steroids. She reported that the treatment greatly improved her breathing. However currently she was still working to get over her exacerbation. She was taking nebulizer treatments when she noticed wheezing. She was managing her blood pressure with her hydralazine. Her carvedilol was held by her PCP and she was taking 12.5 mg when her heart rate increased to 95. I agreed with the change at the current time. I  had her continue her current medication regimen. She reported that she was going to [place] for the entire month of May and she would not be available for follow-up until the beginning of June. I  gave her the salty 6 diet sheet, had her maintain her blood pressure log and follow-up early June. Planned to repeat her fasting lipid panel at that time.  She presented to the [facility] emergency department 08/28/2020 with complaints of lower extremity swelling.  Her blood pressure was 140/74 with pulse of 67.  Her furosemide was increased to 40 mg twice  daily x2 days.  She was then instructed to resume her 40 mg Hull and 20 mg nightly dosing.  Lower extremity compression stockings, low-salt diet were reviewed.  She was given 40 mg IV Lasix x1.  CBC/CMP/troponins unremarkable.  She reported that her symptoms had improved and her neck pain had resolved after IV diuresis, lidocaine patch, and Tylenol.  She was discharged in stable condition.  She was seen virtually 09/03/2020 in follow-up and stated she feels well.  She reported that she was getting more salt in her diet.  We reviewed her emergency department visit.  Her weight was now back to baseline.  I  decreased her furosemide to 40 mg a.m. and 20 mg in the evening.  We  resumed her carvedilol 12.5 mg twice daily and restarted her 81 mg aspirin.  I  encouraged her to limit her fluid to 48-64 ounces daily, continue her lower extremity support stockings, increase physical activity as tolerated and follow-up as scheduled  She presents to the clinic today for follow-up evaluation states she is having more difficulty with routine daily activities.  She reports that she notices increased shortness of breath with increased physical activity.  Her weight today is up about 3 pounds from her dry weight.  She feels that she is not urinating as much with her furosemide.  She continues to follow a heart healthy low-sodium diet, wear her support stockings, and limit her fluid intake.  I will prescribe torsemide 20 mg in a.m. and 10 mg in the p.m.  We will stop her furosemide, we will continue her diet, fluid restriction, and have her follow-up in 1-2 weeks.  I will order a BMP in 1 week.  Today shedenies chest pain, shortness of breath, lower extremity edema, fatigue, melena, hematuria, hemoptysis, diaphoresis, weakness, presyncope, syncope, orthopnea, and PND.  Home Medications    Prior to Admission medications   Medication Sig Start Date End Date Taking? Authorizing Provider  albuterol (VENTOLIN HFA) 108 (90 Base) MCG/ACT inhaler Inhale 2 puffs into the lungs every 4 (four) hours as needed for wheezing or shortness of breath. 06/25/20   Margarette Canada, NP  Alum & Mag Hydroxide-Simeth (ANTACID ADVANCED PO) Take 1 tablet by mouth daily as needed (indigestion).     [provider]  aspirin EC 81 MG tablet Take 1 tablet (81 mg total) by mouth daily. Swallow whole. 09/03/20   Deberah Pelton, NP  budesonide (PULMICORT) 0.25 MG/2ML nebulizer solution 2 ml 07/05/20   [provider]  carvedilol (COREG) 12.5 MG tablet Take 1 tablet (12.5 mg total) by mouth 2 (two) times daily with a meal. 09/03/20   Dawayne Ohair, Jossie Ng, NP  cetirizine (ZYRTEC) 10 MG tablet Take 10 mg by mouth daily.    [provider]  Cholecalciferol (VITAMIN D3 PO) Take 50 mg  by mouth daily.    [provider]  diphenhydrAMINE (BENADRYL) 25 MG tablet Take 2 tablets by mouth as needed for anaphylaxis.    [provider]  EPINEPHrine 0.3 mg/0.3 mL IJ SOAJ injection Inject 0.3 mLs (0.3 mg total) into the muscle once as needed (anaphylaxis). 12/11/15   Patrecia Pour, MD  furosemide (LASIX) 40 MG tablet Take 40mg  in the Hull and 20mg  in the evening. 09/03/20   Deavion Dobbs, Jossie Ng, NP  hydrALAZINE (APRESOLINE) 25 MG tablet TAKE 1 TABLET (25 MG TOTAL) BY MOUTH 3 (THREE) TIMES DAILY AS NEEDED (FOR SBP GREATER THAN 150). NEEDS MD APPOINTMENT 10/12/20   Lorretta Harp,  MD  isosorbide mononitrate (IMDUR) 30 MG 24 hr tablet TAKE 1 TABLET EVERY DAY 01/29/20   Lorretta Harp, MD  montelukast (SINGULAIR) 10 MG tablet Take 10 mg by mouth at bedtime.    [provider]  Multiple Vitamins-Minerals (AIRBORNE PO) Take 1 tablet by mouth daily.    [provider]  Probiotic Product (PROBIOTIC DAILY PO) Take by mouth.    [provider]  simvastatin (ZOCOR) 40 MG tablet TAKE 1 TABLET EVERY DAY 06/01/20   Lorretta Harp, MD  Spacer/Aero-Holding Chambers (AEROCHAMBER MV) inhaler Use as instructed 06/25/20   Margarette Canada, NP    Family History    Family History  Problem Relation Age of Onset  . Heart disease Brother        MI @ 85, HTN @ 39, DM @ 33   . Heart disease Father        MI  . Heart disease Mother        MI, CVA @ 62  . Heart disease Sister        CHF, CVA  . Breast cancer Sister 20  . Esophageal cancer Brother   . Alzheimer's disease Sister   . Heart disease Brother        MI in 45s, lung cancer  . Heart disease Sister   . Hypertension Sister        x2  . Hyperlipidemia Sister        x2  . Diabetes Sister        x2  . Cancer Sister   . Hypertension Brother   . Colon cancer Neg Hx    She indicated that her mother is deceased. She indicated that her father is deceased. She indicated that two of her ten sisters are  alive. She indicated that only one of her six brothers is alive. She indicated that her maternal grandmother is deceased. She indicated that her maternal grandfather is deceased. She indicated that her paternal grandmother is deceased. She indicated that her paternal grandfather is deceased. She indicated that the status of her neg hx is unknown.  Social History    Social History   Socioeconomic History  . Marital status: Single    Spouse name: Not on file  . Number of children: 0  . Years of education: 12+  . Highest education level: Not on file  Occupational History  . Occupation: Re[employer]red  Tobacco Use  . Smoking status: Former Smoker    Packs/day: 0.25    Years: 15.00    Pack years: 3.75    Types: Cigarettes    Quit date: 02/04/2016    Years since quitting: 4.7  . Smokeless tobacco: Never Used  Vaping Use  . Vaping Use: Never used  Substance and Sexual Activity  . Alcohol use: Not Currently    Comment: occasional - tequila shots on holidays  . Drug use: No  . Sexual activity: Not on file  Other Topics Concern  . Not on file  Social History Narrative   Lives alone    Caffeine use: 2 cups coffee per day (mixed decaf/regular)   Social Determinants of Health   Financial Resource Strain: Not on file  Food Insecurity: Not on file  Transportation Needs: Not on file  Physical Activity: Not on file  Stress: Not on file  Social Connections: Not on file  Intimate Partner Violence: Not on file     Review of Systems    General:  No chills, fever, night  sweats or weight changes.  Cardiovascular:  No chest pain, increased dyspnea on exertion, edema, orthopnea, palpitations, paroxysmal nocturnal dyspnea.  Slight abdominal distention Dermatological: No rash, lesions/masses Respiratory: No cough, dyspnea Urologic: No hematuria, dysuria Abdominal:   No nausea, vomiting, diarrhea, bright red blood per rectum, melena, or hematemesis Neurologic:  No visual changes, wkns, changes in  mental status. All other systems reviewed and are otherwise negative except as noted above.  Physical Exam    VS:  BP 128/62 (BP Location: Left Arm, Patient Position: Sitting, Cuff Size: Normal)   Pulse 81   Ht 5\' 5"  (1.651 m)   Wt 178 lb 6.4 oz (80.9 kg)   SpO2 95%   BMI 29.69 kg/m  , BMI Body mass index is 29.69 kg/m. GEN: Well nourished, well developed, in no acute distress. HEENT: normal. Neck: Supple, no JVD, carotid bruits, or masses. Cardiac: RRR, no murmurs, rubs, or gallops. No clubbing, cyanosis, edema.  Radials/DP/PT 2+ and equal bilaterally.  Respiratory:  Respirations regular and unlabored, clear to auscultation bilaterally. GI: Soft, nontender, nondistended, BS + x 4. MS: no deformity or atrophy. Skin: warm and dry, no rash. Neuro:  Strength and sensation are intact. Psych: Normal affect.  Accessory Clinical Findings    Recent Labs: 03/02/2020: BUN 17; Creatinine, Ser 0.99; Potassium 4.0; Sodium 138   Recent Lipid Panel    Component Value Date/Time   CHOL 129 01/04/2016 0853   TRIG 104 01/04/2016 0853   HDL 51 01/04/2016 0853   CHOLHDL 2.5 01/04/2016 0853   VLDL 21 01/04/2016 0853   LDLCALC 57 01/04/2016 0853    ECG personally reviewed by me today- none today.  Echocardiogram 06/06/2018 Study Conclusions   - Left ventricle: The cavity size was normal. There was mild  concentric hypertrophy. Systolic function was normal. The  estimated ejection fraction was in the range of 60% to 65%. Wall  motion was normal; there were no regional wall motion  abnormalities. Doppler parameters are consistent with abnormal  left ventricular relaxation (grade 1 diastolic dysfunction).  - Mitral valve: Calcified annulus. Mildly thickened leaflets .  - Atrial septum: No defect or patent foramen ovale was identified.  Nuclear stress test 06/14/2018  Nuclear stress EF: 73%.  The left ventricular ejection fraction is hyperdynamic (>65%).  There was no ST  segment deviation noted during stress.  The study is normal.  This is a low risk study.  Normal stress nuclear study with no ischemia or infarction. Gated ejection fraction 73% with normal wall motion.  Assessment & Plan   1.  Chronic diastolic CHF- abdomen slightly distended.  Weight today 178.4  has noticed activity intolerance. Nuclear stress test shows EF of 73 on 06/14/2018. Continue, carvedilol, Imdur, hydralazine Stop furosemide Start torsemide 20 mg a.m. and 10 mg p.m. Heart healthy low-sodium diet-salty 6 given Increase physical activity as tolerated Continue fluid restriction 60-48 ounces daily Continue Daily weights-dry weight around 175 pounds Ordered BMP in 1 week  Essential hypertension-BP today 128/62 .Well-controlled at home. ContinueImdur, hydralazine,  Continue carvedilol 12.5 mg twice daily Heart healthy low-sodium diet-salty 6 given Increase physical activity as tolerated  Coronary artery disease-no recent episodes of chest pain. Cardiac catheterization 8/12 showed 60% mid LAD stenosis. Nuclear stress test 1/20 showed low risk and no ischemia. Continuecarvedilol, aspirin, Imdur, simvastatin Heart healthy low-sodium diet-salty 6 given Increase physical activity as tolerated  Paroxysmal SVT-no recent accelerated heartbeats or irregular beats. Continues with occasionalepisodes of palpitations or heart flutter. Continue carvedilol Heart healthy low-sodium  diet Avoid triggers caffeine, chocolate, EtOH etc.  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 Episode required 48 hours of intubation. Allergy testing subsequently showed allergies to ARB/ACE and red meat.   Disposition: Follow-up with Dr. Gwenlyn Hull or me virtually in 1-2 weeks.  Jossie Ng. Zachari Alberta NP-C    11/17/2020, 11:52 AM [place] [address] Office [phone] Fax [phone]  Notice: This dictation was  prepared with Dragon dictation along with smaller phrase technology. Any transcriptional errors that result from this process are unintentional and may not be corrected upon review.  I spent 15 minutes examining this patient, reviewing medications, and using patient centered shared decision making involving her cardiac care.  Prior to her visit I spent greater than 20 minutes reviewing her past medical history,  medications, and prior cardiac tests.

## 2020-11-17 ENCOUNTER — Encounter: Payer: Self-pay | Admitting: General Practice

## 2020-11-17 ENCOUNTER — Other Ambulatory Visit: Payer: Self-pay

## 2020-11-17 ENCOUNTER — Ambulatory Visit (INDEPENDENT_AMBULATORY_CARE_PROVIDER_SITE_OTHER): Payer: Medicare HMO | Admitting: General Practice

## 2020-11-17 VITALS — BP 128/62 | HR 81 | Ht 65.0 in | Wt 178.4 lb

## 2020-11-17 DIAGNOSIS — I5032 Chronic diastolic (congestive) heart failure: Secondary | ICD-10-CM | POA: Diagnosis not present

## 2020-11-17 DIAGNOSIS — R0600 Dyspnea, unspecified: Secondary | ICD-10-CM

## 2020-11-17 DIAGNOSIS — I10 Essential (primary) hypertension: Secondary | ICD-10-CM

## 2020-11-17 DIAGNOSIS — I471 Supraventricular tachycardia: Secondary | ICD-10-CM

## 2020-11-17 DIAGNOSIS — R0609 Other forms of dyspnea: Secondary | ICD-10-CM

## 2020-11-17 DIAGNOSIS — E785 Hyperlipidemia, unspecified: Secondary | ICD-10-CM | POA: Diagnosis not present

## 2020-11-17 MED ORDER — CARVEDILOL 12.5 MG PO TABS: 12.5000 mg | ORAL_TABLET | Freq: Two times a day (BID) | ORAL | 3 refills | Status: DC

## 2020-11-17 MED ORDER — TORSEMIDE 20 MG PO TABS: ORAL_TABLET | ORAL | 3 refills | Status: DC

## 2020-11-17 MED ORDER — HYDRALAZINE HCL 25 MG PO TABS: ORAL_TABLET | ORAL | 0 refills | Status: DC

## 2020-11-17 MED ORDER — ISOSORBIDE MONONITRATE ER 30 MG PO TB24: 30.0000 mg | ORAL_TABLET | Freq: Every day | ORAL | 3 refills | Status: DC

## 2020-11-17 NOTE — Patient Instructions (Signed)
Medication Instructions:  STOP LASIX  START TORSEMIDE 20MG  IN THE AM AND 10MG  IN THE PM *If you need a refill on your cardiac medications before your next appointment, please call your pharmacy*  Lab Work: BMET-THIS IS NON-FASTING -IN 1 WEEK (11-24-2020) If you have labs (blood work) drawn today and your tests are completely normal, you will receive your results only by:  [facility] (if you have MyChart) OR A paper copy in the mail.  If you have any lab test that is abnormal or we need to change your treatment, we will call you to review the results. You may go to any [facility] that is convenient for you however, we do have a lab in our office that is able to assist you. You DO NOT need an appointment for our lab. The lab is open 8:00am and closes at 4:00pm. Lunch 12:45 - 1:45pm.  Special Instructions MAKE SURE TO CALL IF YOU ARE FEELING WORSE OR MEDICATION ISSUES  PLEASE READ AND FOLLOW SALTY 6-ATTACHED-1,800 mg daily  PLEASE INCREASE PHYSICAL ACTIVITY AS TOLERATED  Follow-Up: Your next appointment:  01-26-2021 @ 1015AM  In Person with Coletta Memos, FNP   At [facility], you and your health needs are our priority.  As part of our continuing mission to provide you with exceptional heart care, we have created designated Provider Care Teams.  These Care Teams include your primary Cardiologist (physician) and Advanced Practice Providers (APPs -  Physician Assistants and Nurse Practitioners) who all work together to provide you with the care you need, when you need it.

## 2020-11-23 DIAGNOSIS — I5032 Chronic diastolic (congestive) heart failure: Secondary | ICD-10-CM | POA: Diagnosis not present

## 2020-11-23 DIAGNOSIS — I10 Essential (primary) hypertension: Secondary | ICD-10-CM | POA: Diagnosis not present

## 2020-11-23 DIAGNOSIS — E785 Hyperlipidemia, unspecified: Secondary | ICD-10-CM | POA: Diagnosis not present

## 2020-11-23 DIAGNOSIS — R0600 Dyspnea, unspecified: Secondary | ICD-10-CM | POA: Diagnosis not present

## 2020-11-23 DIAGNOSIS — I471 Supraventricular tachycardia: Secondary | ICD-10-CM | POA: Diagnosis not present

## 2020-11-24 LAB — BASIC METABOLIC PANEL
BUN/Creatinine Ratio: 16 (ref 12–28)
BUN: 15 mg/dL (ref 8–27)
CO2: 28 mmol/L (ref 20–29)
Calcium: 8.7 mg/dL (ref 8.7–10.3)
Chloride: 97 mmol/L (ref 96–106)
Creatinine, Ser: 0.96 mg/dL (ref 0.57–1.00)
Glucose: 87 mg/dL (ref 65–99)
Potassium: 3.9 mmol/L (ref 3.5–5.2)
Sodium: 140 mmol/L (ref 134–144)
eGFR: 60 mL/min/{1.73_m2} (ref >59)

## 2020-12-02 DIAGNOSIS — J45998 Other asthma: Secondary | ICD-10-CM | POA: Diagnosis not present

## 2020-12-10 ENCOUNTER — Other Ambulatory Visit: Payer: Self-pay | Admitting: General Practice

## 2020-12-10 DIAGNOSIS — E785 Hyperlipidemia, unspecified: Secondary | ICD-10-CM

## 2020-12-10 DIAGNOSIS — R0609 Other forms of dyspnea: Secondary | ICD-10-CM

## 2020-12-10 DIAGNOSIS — R0600 Dyspnea, unspecified: Secondary | ICD-10-CM

## 2021-01-01 DIAGNOSIS — J45998 Other asthma: Secondary | ICD-10-CM | POA: Diagnosis not present

## 2021-01-10 ENCOUNTER — Other Ambulatory Visit: Payer: Self-pay | Admitting: Cardiovascular Disease

## 2021-01-10 DIAGNOSIS — Z20822 Contact with and (suspected) exposure to covid-19: Secondary | ICD-10-CM | POA: Diagnosis not present

## 2021-01-11 DIAGNOSIS — J324 Chronic pansinusitis: Secondary | ICD-10-CM | POA: Diagnosis not present

## 2021-01-11 DIAGNOSIS — J302 Other seasonal allergic rhinitis: Secondary | ICD-10-CM | POA: Diagnosis not present

## 2021-01-11 DIAGNOSIS — J339 Nasal polyp, unspecified: Secondary | ICD-10-CM | POA: Diagnosis not present

## 2021-01-26 ENCOUNTER — Ambulatory Visit: Payer: Medicare HMO | Admitting: General Practice

## 2021-01-26 DIAGNOSIS — I2510 Atherosclerotic heart disease of native coronary artery without angina pectoris: Secondary | ICD-10-CM | POA: Diagnosis not present

## 2021-01-26 DIAGNOSIS — E785 Hyperlipidemia, unspecified: Secondary | ICD-10-CM | POA: Diagnosis not present

## 2021-01-26 DIAGNOSIS — Z8679 Personal history of other diseases of the circulatory system: Secondary | ICD-10-CM | POA: Diagnosis not present

## 2021-01-26 DIAGNOSIS — E119 Type 2 diabetes mellitus without complications: Secondary | ICD-10-CM | POA: Diagnosis not present

## 2021-02-01 DIAGNOSIS — I519 Heart disease, unspecified: Secondary | ICD-10-CM | POA: Diagnosis not present

## 2021-02-01 DIAGNOSIS — J329 Chronic sinusitis, unspecified: Secondary | ICD-10-CM | POA: Diagnosis not present

## 2021-02-01 DIAGNOSIS — Z9109 Other allergy status, other than to drugs and biological substances: Secondary | ICD-10-CM | POA: Diagnosis not present

## 2021-02-01 DIAGNOSIS — J45998 Other asthma: Secondary | ICD-10-CM | POA: Diagnosis not present

## 2021-02-01 DIAGNOSIS — J45909 Unspecified asthma, uncomplicated: Secondary | ICD-10-CM | POA: Diagnosis not present

## 2021-02-01 DIAGNOSIS — I2510 Atherosclerotic heart disease of native coronary artery without angina pectoris: Secondary | ICD-10-CM | POA: Diagnosis not present

## 2021-02-01 DIAGNOSIS — I498 Other specified cardiac arrhythmias: Secondary | ICD-10-CM | POA: Diagnosis not present

## 2021-02-01 DIAGNOSIS — R198 Other specified symptoms and signs involving the digestive system and abdomen: Secondary | ICD-10-CM | POA: Diagnosis not present

## 2021-02-01 DIAGNOSIS — E785 Hyperlipidemia, unspecified: Secondary | ICD-10-CM | POA: Diagnosis not present

## 2021-02-01 DIAGNOSIS — I10 Essential (primary) hypertension: Secondary | ICD-10-CM | POA: Diagnosis not present

## 2021-02-03 ENCOUNTER — Ambulatory Visit: Payer: Medicare HMO | Admitting: General Practice

## 2021-02-07 ENCOUNTER — Other Ambulatory Visit: Payer: Self-pay

## 2021-02-07 ENCOUNTER — Ambulatory Visit (HOSPITAL_COMMUNITY)
Admission: EM | Admit: 2021-02-07 | Discharge: 2021-02-07 | Disposition: A | Discharge status: Home / Self Care | Payer: Medicare HMO | Attending: Emergency Medicine | Admitting: Emergency Medicine

## 2021-02-07 ENCOUNTER — Encounter (HOSPITAL_COMMUNITY): Payer: Self-pay

## 2021-02-07 DIAGNOSIS — E119 Type 2 diabetes mellitus without complications: Secondary | ICD-10-CM | POA: Diagnosis not present

## 2021-02-07 DIAGNOSIS — R0981 Nasal congestion: Secondary | ICD-10-CM | POA: Diagnosis not present

## 2021-02-07 DIAGNOSIS — R509 Fever, unspecified: Secondary | ICD-10-CM | POA: Diagnosis not present

## 2021-02-07 DIAGNOSIS — Z888 Allergy status to other drugs, medicaments and biological substances status: Secondary | ICD-10-CM | POA: Insufficient documentation

## 2021-02-07 DIAGNOSIS — I110 Hypertensive heart disease with heart failure: Secondary | ICD-10-CM | POA: Insufficient documentation

## 2021-02-07 DIAGNOSIS — Z7982 Long term (current) use of aspirin: Secondary | ICD-10-CM | POA: Insufficient documentation

## 2021-02-07 DIAGNOSIS — Z79899 Other long term (current) drug therapy: Secondary | ICD-10-CM | POA: Insufficient documentation

## 2021-02-07 DIAGNOSIS — R062 Wheezing: Secondary | ICD-10-CM | POA: Diagnosis not present

## 2021-02-07 DIAGNOSIS — R519 Headache, unspecified: Secondary | ICD-10-CM | POA: Diagnosis not present

## 2021-02-07 DIAGNOSIS — R0602 Shortness of breath: Secondary | ICD-10-CM | POA: Diagnosis not present

## 2021-02-07 DIAGNOSIS — Z7901 Long term (current) use of anticoagulants: Secondary | ICD-10-CM | POA: Diagnosis not present

## 2021-02-07 DIAGNOSIS — M549 Dorsalgia, unspecified: Secondary | ICD-10-CM | POA: Diagnosis not present

## 2021-02-07 DIAGNOSIS — Z87891 Personal history of nicotine dependence: Secondary | ICD-10-CM | POA: Insufficient documentation

## 2021-02-07 DIAGNOSIS — B349 Viral infection, unspecified: Secondary | ICD-10-CM | POA: Diagnosis not present

## 2021-02-07 DIAGNOSIS — R059 Cough, unspecified: Secondary | ICD-10-CM | POA: Insufficient documentation

## 2021-02-07 DIAGNOSIS — U071 COVID-19: Secondary | ICD-10-CM | POA: Diagnosis not present

## 2021-02-07 LAB — SARS CORONAVIRUS 2 (TAT 6-24 HRS): SARS Coronavirus 2: POSITIVE — AB

## 2021-02-07 NOTE — Discharge Instructions (Addendum)
Your symptoms today are most likely viral, viruses have to resolve over time and it may take up to 7 to 10 days before you start to feel better  Your lungs today did not sound like there was any fluid or constriction present therefore we will hold off on getting a chest x-ray  At any point if you start to have worsening shortness of breath, worsening wheezing, difficulty breathing or your O2 saturations do not stay above 90 please follow-up at the urgent care or the nearest emergency department  Continue use of your albuterol inhaler, Tessalon pills, montelukast, saline solution and use of Tylenol

## 2021-02-07 NOTE — ED Triage Notes (Signed)
Pt reports woke up this morning with fever 102, took Tylenol around 11am, chills, body aches, lower back pains and productive cough with yellow phlegm. Pt reports her pulse ox at home was 90%.

## 2021-02-07 NOTE — ED Provider Notes (Signed)
[place]    CSN: [id number] Arrival date & time: 02/07/21  1134      History   Chief Complaint Chief Complaint  Patient presents with   Cough   Back Pain   Fever    HPI DOCIE HUBLEY is a 80 y.o. female.   Patient presents with fever peaking at 102, chills, body aches and productive cough, intermittent frontal headache beginning this morning.  Home O2 saturation was 90%.  Denies ear pain or fullness, sore throat, abdominal pain, nausea, vomiting, diarrhea.  Endorses chronic nasal congestion, rhinorrhea, shortness of breath and wheezing.  Denies that the symptoms have worsened from baseline.  Unknown sick contacts.  Vaccinated.  Did attend a large church event yesterday.  Took a Tylenol for fever which provided relief.  History of chronic sinusitis CHF, diabetes type 2, hypertension, hyperlipidemia, heart murmur.  Past Medical History:  Diagnosis Date   Arthritis    CHF (congestive heart failure) (HCC)    Diabetes mellitus without complication (HCC)    borderline   Diarrhea    Diverticulosis    Dysrhythmia    controlled with Metoprolol-12-19-13 LOV -Dr. Gwenlyn Found sees yearly   Family history of premature CAD    GERD (gastroesophageal reflux disease)    Heart murmur    yrs ago   HLD (hyperlipidemia)    pt reports cholesterol normal now (07/21/19).   Hypertension    Lower extremity edema    Status post dilation of esophageal narrowing    Supraventricular arrhythmia    Tobacco abuse     Patient Active Problem List   Diagnosis Date Noted   Sinusitis, chronic 03/04/2020   Chest pain 05/21/2018   SVT (supraventricular tachycardia) ([place]) 05/21/2018   Acute sinusitis 01/24/2018   Chronic diastolic (congestive) heart failure ([place]) 05/31/2017   Left leg weakness 02/10/2016   Left foot drop 02/10/2016   DJD (degenerative joint disease) 01/26/2016   History of angioedema 01/21/2016   Cervical spondylosis with myelopathy and radiculopathy 06/08/2015    Coronary artery disease 12/18/2014   Esophageal stricture 04/23/2014   Lower extremity numbness 08/11/2013   Essential hypertension 05/15/2013   Dyslipidemia 05/15/2013   Dyspnea 05/15/2013   Lower extremity edema 05/15/2013   Diarrhea 02/12/2013   Esophageal reflux 01/20/2010   Dysphagia 01/20/2010    Past Surgical History:  Procedure Laterality Date   ANTERIOR CERVICAL DECOMP/DISCECTOMY FUSION N/A 06/08/2015   Procedure: CERVICAL FIVE-SIX ,CERVICAL SIX-SEVEN ANTERIOR CERVICAL DECOMPRESSION/DISCECTOMY FUSION PLATING BONEGRAFT;  Surgeon: Kristeen Miss, MD;  Location: [facility];  Service: Neurosurgery;  Laterality: N/A;   CARDIAC CATHETERIZATION  01/15/2009   non-critical CAD, 60% mid LAD lesion (Dr. Adora Fridge)   CATARACT EXTRACTION, BILATERAL     most recent 11/19   COLONOSCOPY     ESOPHAGEAL DILATION  2014   infected lymph node surgery  80's   NM MYOCAR PERF WALL MOTION  04/2011   bruce myoview; normal pattern of perfusion, EF 80%, low risk scan   SINUS ENDO WITH FUSION Bilateral 03/04/2020   Procedure: SINUS ENDOSCOPY WITH FUSION NAVIGATION;  Surgeon: Jerrell Belfast, MD;  Location: [facility];  Service: ENT;  Laterality: Bilateral;   TONSILLECTOMY  70's   TRANSTHORACIC ECHOCARDIOGRAM  12/2007   mild conc LVH   TURBINATE REDUCTION Bilateral 03/04/2020   Procedure: TURBINATE REDUCTION;  Surgeon: Jerrell Belfast, MD;  Location: [facility];  Service: ENT;  Laterality: Bilateral;    OB History   No obstetric history on file.  Home Medications    Prior to Admission medications   Medication Sig Start Date End Date Taking? Authorizing Provider  aspirin EC 81 MG tablet Take 1 tablet (81 mg total) by mouth daily. Swallow whole. 09/03/20  Yes Cleaver, Jossie Ng, NP  benzonatate (TESSALON) 200 MG capsule Take 200 mg by mouth 3 (three) times daily as needed for cough (cough).   Yes [provider]  carvedilol (COREG) 12.5 MG tablet Take 1  tablet (12.5 mg total) by mouth 2 (two) times daily with a meal. 11/17/20  Yes Cleaver, Jossie Ng, NP  cetirizine (ZYRTEC) 10 MG tablet Take 10 mg by mouth daily.   Yes [provider]  albuterol (VENTOLIN HFA) 108 (90 Base) MCG/ACT inhaler Inhale 2 puffs into the lungs every 4 (four) hours as needed for wheezing or shortness of breath. 06/25/20   Margarette Canada, NP  Alum & Mag Hydroxide-Simeth (ANTACID ADVANCED PO) Take 1 tablet by mouth daily as needed (indigestion).     [provider]  Cholecalciferol (VITAMIN D3 PO) Take 50 mg by mouth daily.    [provider]  diphenhydrAMINE (BENADRYL) 25 MG tablet Take 2 tablets by mouth as needed for anaphylaxis.    [provider]  EPINEPHrine 0.3 mg/0.3 mL IJ SOAJ injection Inject 0.3 mLs (0.3 mg total) into the muscle once as needed (anaphylaxis). 12/11/15   Patrecia Pour, MD  hydrALAZINE (APRESOLINE) 25 MG tablet TAKE 1 TABLET THREE TIMES DAILY AS NEEDED FOR SYSTOLIC BLOOD PRESSURE GREATER THAN 150 (NEED MD APPOINTMENT) 12/10/20   Deberah Pelton, NP  isosorbide mononitrate (IMDUR) 30 MG 24 hr tablet Take 1 tablet (30 mg total) by mouth daily. 11/17/20   Deberah Pelton, NP  montelukast (SINGULAIR) 10 MG tablet Take 10 mg by mouth at bedtime.    [provider]  Multiple Vitamins-Minerals (AIRBORNE PO) Take 1 tablet by mouth daily.    [provider]  Probiotic Product (PROBIOTIC DAILY PO) Take by mouth.    [provider]  simvastatin (ZOCOR) 40 MG tablet TAKE 1 TABLET EVERY DAY 06/01/20   Lorretta Harp, MD  Spacer/Aero-Holding Chambers (AEROCHAMBER MV) inhaler Use as instructed 06/25/20   Margarette Canada, NP  torsemide (DEMADEX) 20 MG tablet Take 1 tablet (20 mg total) by mouth in the morning AND 0.5 tablets (10 mg total) daily at 2 PM. 11/17/20 02/15/21  Cleaver, Jossie Ng, NP    Family History Family History  Problem Relation Age of Onset   Heart disease Brother        MI @ 67, HTN @ 72, DM @ 16     Heart disease Father        MI   Heart disease Mother        MI, CVA @ 32   Heart disease Sister        CHF, CVA   Breast cancer Sister 48   Esophageal cancer Brother    Alzheimer's disease Sister    Heart disease Brother        MI in 73s, lung cancer   Heart disease Sister    Hypertension Sister        x2   Hyperlipidemia Sister        x2   Diabetes Sister        x2   Cancer Sister    Hypertension Brother    Colon cancer Neg Hx     Social History Social History   Tobacco Use  Smoking status: Former    Packs/day: 0.25    Years: 15.00    Pack years: 3.75    Types: Cigarettes    Quit date: 02/04/2016    Years since quitting: 5.0   Smokeless tobacco: Never  Vaping Use   Vaping Use: Never used  Substance Use Topics   Alcohol use: Not Currently    Comment: occasional - tequila shots on holidays   Drug use: No     Allergies   Nexium [esomeprazole magnesium], Other, Codeine, Ace inhibitors, Latex, Monosodium glutamate, Amlodipine, Shellfish allergy, and Tape   Review of Systems Review of Systems Defer to HPI    Physical Exam Triage Vital Signs ED Triage Vitals [02/07/21 1258]  Enc Vitals Group     BP 128/70     Pulse Rate 84     Resp 18     Temp 97.7 F (36.5 C)     Temp Source Tympanic     SpO2 93 %     Weight      Height      Head Circumference      Peak Flow      Pain Score 0     Pain Loc      Pain Edu?      Excl. in [place]?    No data found.  Updated Vital Signs BP 128/70 (BP Location: Right Arm)   Pulse 84   Temp 97.7 F (36.5 C) (Tympanic)   Resp 18   SpO2 93%   Visual Acuity Right Eye Distance:   Left Eye Distance:   Bilateral Distance:    Right Eye Near:   Left Eye Near:    Bilateral Near:     Physical Exam Constitutional:      Appearance: Normal appearance. She is normal weight.  HENT:     Head: Normocephalic.     Right Ear: Ear canal and external ear normal. A middle ear effusion is present.     Left Ear: Ear canal and  external ear normal. A middle ear effusion is present.     Mouth/Throat:     Mouth: Mucous membranes are moist.     Pharynx: Oropharynx is clear.  Eyes:     Extraocular Movements: Extraocular movements intact.  Cardiovascular:     Rate and Rhythm: Normal rate and regular rhythm.     Pulses: Normal pulses.     Heart sounds: Normal heart sounds.  Pulmonary:     Effort: Pulmonary effort is normal.     Breath sounds: Normal breath sounds.  Abdominal:     General: Abdomen is flat. Bowel sounds are normal.     Palpations: Abdomen is soft.  Musculoskeletal:     Cervical back: Normal range of motion and neck supple.  Skin:    General: Skin is warm and dry.  Neurological:     Mental Status: She is alert and oriented to person, place, and time. Mental status is at baseline.  Psychiatric:        Mood and Affect: Mood normal.        Behavior: Behavior normal.     UC Treatments / Results  Labs (all labs ordered are listed, but only abnormal results are displayed) Labs Reviewed  SARS CORONAVIRUS 2 (TAT 6-24 HRS)    EKG   Radiology No results found.  Procedures Procedures (including critical care time)  Medications Ordered in UC Medications - No data to display  Initial Impression / Assessment and Plan / UC Course  I have reviewed the triage vital signs and the nursing notes.  Pertinent labs & imaging results that were available during my care of the patient were reviewed by me and considered in my medical decision making (see chart for details).  Viral illness  Discussed etiology of his symptoms with patient, respiratory exam within normal limits, will defer chest x-ray at this time can manage conservatively  COVID test pending   Continue saline rinses, use of albuterol inhaler, use of Tessalon and montelukast and Tylenol for symptom management Strict return precautions for worsening respiratory symptoms and inability to maintain O2 saturations for follow-up at urgent care  nearest emergency department. Final Clinical Impressions(s) / UC Diagnoses   Final diagnoses:  Viral illness     Discharge Instructions      Your symptoms today are most likely viral, viruses have to resolve over time and it may take up to 7 to 10 days before you start to feel better  Your lungs today did not sound like there was any fluid or constriction present therefore we will hold off on getting a chest x-ray  At any point if you start to have worsening shortness of breath, worsening wheezing, difficulty breathing or your O2 saturations do not stay above 90 please follow-up at the urgent care or the nearest emergency department  Continue use of your albuterol inhaler, Tessalon pills, montelukast, saline solution and use of Tylenol    ED Prescriptions   None    PDMP not reviewed this encounter.   Hans Eden, NP 02/07/21 1340

## 2021-02-15 DIAGNOSIS — U071 COVID-19: Secondary | ICD-10-CM | POA: Diagnosis not present

## 2021-02-23 DIAGNOSIS — N816 Rectocele: Secondary | ICD-10-CM | POA: Diagnosis not present

## 2021-02-23 DIAGNOSIS — R194 Change in bowel habit: Secondary | ICD-10-CM | POA: Diagnosis not present

## 2021-03-01 ENCOUNTER — Telehealth: Payer: Self-pay | Admitting: Cardiovascular Disease

## 2021-03-01 NOTE — Telephone Encounter (Signed)
Pt c/o BP issue: STAT if pt c/o blurred vision, one-sided weakness or slurred speech  1. What are your last 5 BP readings? 109-112/59-60 HR 51  2. Are you having any other symptoms (ex. Dizziness, headache, blurred vision, passed out)? Denies these symptoms  3. What is your BP issue? Patient states that her BP and HR have been running low. She states that she needs to see a cardiologist sooner than the appt she has scheduled for 11/01 with Coletta Memos, NP. She has medication for her arrhythmia but is scared to take it because she is scared it is going to slow down her HR even more. Please advise.

## 2021-03-01 NOTE — Telephone Encounter (Signed)
Spoke to pt. She report she had COVID on 8/23 and since has been experiencing episodes of low BP/HR systolic ranging from 225-750 HR in the 50's.  Pt report she is now scared to take her medication as her systolic dropped to 98 and she felt very drained. Pt state now she will only take her isosorbide, hydralazine, or carvedilol if her systolic is greater than 518.  Pt state she is going to the beach on 9/28 and would like to be seen before then. Appointment scheduled for 9/27. Will forward to NP for recommendations in the meantime.

## 2021-03-02 NOTE — Telephone Encounter (Signed)
Megan Pelton, NP  Meryl Crutch, RN 3 hours ago (6:33 AM)  At this time given that her blood pressure is well controlled, she does not need to take her hydralazine.  Please contact her and ask her to hold her hydralazine unless she has systolic blood pressures greater than 166 systolic.  Please reduce her carvedilol to 6.25 mg twice daily.  We will continue her Imdur at the current dose.  Please ask her to continue to monitor her blood pressure.  She may hold her medications if her systolic blood pressure is less than 110.  Thank you. -JC  Called pt she states that she has not taken her BP yet today but last night BP was 127/71 she did not take her medication all day yesterday her BP now is 150/74 she will take all her medications and continue to monitor and call if BP is out of range.

## 2021-03-04 DIAGNOSIS — J45998 Other asthma: Secondary | ICD-10-CM | POA: Diagnosis not present

## 2021-03-07 NOTE — Progress Notes (Signed)
Cardiology Clinic Note   Patient Name: Megan Hull Date of Encounter: 03/08/2021  Primary Care Provider:  [name], DO Primary Cardiologist:  Megan Burow, MD  Patient Profile    Megan Hull 80 year old female presents to the clinic today for an evaluation of her low blood pressure and heart rate.  Past Medical History    Past Medical History:  Diagnosis Date   Arthritis    CHF (congestive heart failure) (HCC)    Diabetes mellitus without complication (HCC)    borderline   Diarrhea    Diverticulosis    Dysrhythmia    controlled with Metoprolol-12-19-13 LOV -Dr. Gwenlyn Hull sees yearly   Family history of premature CAD    GERD (gastroesophageal reflux disease)    Heart murmur    yrs ago   HLD (hyperlipidemia)    pt reports cholesterol normal now (07/21/19).   Hypertension    Lower extremity edema    Status post dilation of esophageal narrowing    Supraventricular arrhythmia    Tobacco abuse    Past Surgical History:  Procedure Laterality Date   ANTERIOR CERVICAL DECOMP/DISCECTOMY FUSION N/A 06/08/2015   Procedure: CERVICAL FIVE-SIX ,CERVICAL SIX-SEVEN ANTERIOR CERVICAL DECOMPRESSION/DISCECTOMY FUSION PLATING BONEGRAFT;  Surgeon: Megan Miss, MD;  Location: [facility];  Service: Neurosurgery;  Laterality: N/A;   CARDIAC CATHETERIZATION  01/15/2009   non-critical CAD, 60% mid LAD lesion (Dr. Adora Hull)   CATARACT EXTRACTION, BILATERAL     most recent 11/19   COLONOSCOPY     ESOPHAGEAL DILATION  2014   infected lymph node surgery  80's   NM MYOCAR PERF WALL MOTION  04/2011   bruce myoview; normal pattern of perfusion, EF 80%, low risk scan   SINUS ENDO WITH FUSION Bilateral 03/04/2020   Procedure: SINUS ENDOSCOPY WITH FUSION NAVIGATION;  Surgeon: Megan Belfast, MD;  Location: [facility];  Service: ENT;  Laterality: Bilateral;   TONSILLECTOMY  70's   TRANSTHORACIC ECHOCARDIOGRAM  12/2007   mild conc LVH   TURBINATE REDUCTION  Bilateral 03/04/2020   Procedure: TURBINATE REDUCTION;  Surgeon: Megan Belfast, MD;  Location: [facility];  Service: ENT;  Laterality: Bilateral;    Allergies  Allergies  Allergen Reactions   Nexium [Esomeprazole Magnesium] Anaphylaxis    Pt states she was unable to swallow for several hours after taking the nexium   Other Hives, Shortness Of Breath and Other (See Comments)    Red Fish (Hives) Bojangle's chicken (swelling and shortness of breath) PAPRIKA   Codeine Other (See Comments)    REACTION: chest pain   Ace Inhibitors Swelling    Angioedema    Latex Other (See Comments)    Causes skin to turn red per patient    Monosodium Glutamate Other (See Comments)    dizziness   Amlodipine Other (See Comments)    Lower extremity swelling, itching   Shellfish Allergy Hives   Tape Rash    Coban wrap turned her arm red (after stress test)    History of Present Illness    [name] has a PMH of essential hypertension, coronary artery disease, chronic diastolic CHF, SVT, esophageal reflux, DJD, dyslipidemia, lower extremity numbness, lower extremity edema, dyspnea, and left foot drop.  Cardiac catheterization 812 showed 60% mid LAD narrowing.  Angioedema secondary to ACE inhibitor.   She was  seen by Megan Ransom, PA-C on 07/30/2019.  During that time it was noted that she had presented to the emergency department 12/19 with symptoms  of substernal chest pain.  She was noted to have an episode of SVT while sitting on her couch watching football.  The SVT was broken with a Valsalva maneuver, and was followed by palpitations and SS CP.  It was noted that she had previously had palpitations and ectopy however had not had any history of SVT.  She is a retired Programmer, multimedia at [employer] and has some medical and cardiology knowledge.  She indicated that she can tell when her blood pressure is elevated due to left neck pain.   She was seen by Dr. Gwenlyn Hull  12/20.  An echocardiogram 12/19 and nuclear stress test 1/20 after her complaints of neck pain and dyspnea on exertion showed low risk.  Her echocardiogram showed an LVEF of 60-65%, mild LVH and G1 DD.  She was also evaluated by pulmonary and was not felt to have contributed COPD.   When she last presented to the clinic on 07/30/2019 she brought blood pressure readings that showed consistent elevated systolic blood pressure 814-481 systolic.  She brought her blood pressure monitor from home which showed an increase in systolic pressure by around 40 points.  Her blood pressure in the office was 120s over 60s.  She was grateful to discover this.  She was doing well from a cardiac standpoint.  She continued to have chronic dyspnea on exertion she had been prescribed albuterol however had not used the medication.  She had not had any recurrent episodes of PSVT.   She presented to the clinic 12/03/2019 for follow-up evaluation and stated she felt well.  She stated that she  had episodes where her blood pressure was in the 115's over 60s and 70s and she had taken her hydralazine which further dropped down her blood pressure and maked her feel not well.  She requested that she have 25 mg tablets to be able to take in the evening when her blood pressure is lower.  She continuee to be very physically active in her church and followed a heart healthy diet.  She brought a blood pressure log  that showed well-controlled blood pressure.  She stated that she had an appointment with Dr. Gwenlyn Hull in August for clearance for her sinus surgery.  I  gave her a prescription for an additional 25 mg of hydralazine, gave her the salty 6 diet sheet, and had her follow-up as scheduled.   She was seen by Dr. Gwenlyn Hull 02/10/2020 in follow-up.  She continued to do well.  She did complain of allergies and sinus drainage.  Her sinus surgery was discussed and deemed to be a safe procedure from a cardiac standpoint.  Her blood pressure was well  managed at that time.   She presented to the clinic 08/09/2020 for follow-up evaluation and stated around 1 month ago she had asthma exacerbation that required nebulizer treatments and steroids.  She reported that the treatment greatly improved her breathing.  However currently she was still working to get over her exacerbation.  She was taking nebulizer treatments when she noticed wheezing.  She was managing her blood pressure with her hydralazine.  Her carvedilol was held by her PCP and she was taking 12.5 mg when her heart rate increased to 95.  I agreed with the change at the current time.  I  had her continue her current medication regimen.  She reported that she was going to [place] for the entire month of May and she would not be available for follow-up until  the beginning of June.  I  gave her the salty 6 diet sheet, had her maintain her blood pressure log and follow-up early June.  Planned to repeat her fasting lipid panel at that time.   She presented to the [facility] emergency department 08/28/2020 with complaints of lower extremity swelling.  Her blood pressure was 140/74 with pulse of 67.  Her furosemide was increased to 40 mg twice daily x2 days.  She was then instructed to resume her 40 mg Hull and 20 mg nightly dosing.  Lower extremity compression stockings, low-salt diet were reviewed.  She was given 40 mg IV Lasix x1.  CBC/CMP/troponins unremarkable.  She reported that her symptoms had improved and her neck pain had resolved after IV diuresis, lidocaine patch, and Tylenol.  She was discharged in stable condition.   She was seen virtually 09/03/2020 in follow-up and stated she feels well.  She reported that she was getting more salt in her diet.  We reviewed her emergency department visit.  Her weight was now back to baseline.  I  decreased her furosemide to 40 mg a.m. and 20 mg in the evening.  We  resumed her carvedilol 12.5 mg twice daily and restarted her 81 mg aspirin.  I encouraged her to  limit her fluid to 48-64 ounces daily, continue her lower extremity support stockings, increase physical activity as tolerated and follow-up as scheduled   She presented to the clinic 11/17/2020 for follow-up evaluation stated she was having more difficulty with routine daily activities.  She reported that she noticed increased shortness of breath with increased physical activity.  Her weight was up about 3 pounds from her dry weight.  She felt that she was not urinating as much with her furosemide.  She continued to follow a heart healthy low-sodium diet, wear her support stockings, and limit her fluid intake.  I will prescribe torsemide 20 mg in a.m. and 10 mg in the p.m.   Her furosemide was stopped.  I  ordered a BMP in 1 week.  She contacted the nurse triage line on 03/01/2021.  She reported having COVID on 8/23.  During the time of the call, she reported low blood pressures with systolic pressures ranging in the 109-112 range with heart rates in the 50s.  I instructed her to decrease her carvedilol to 6.25 mg daily, continue her Imdur, and only take hydralazine for systolic blood pressure greater than 150.  She presents the clinic today for follow-up evaluation states she has noticed that her blood pressure is now higher in the mornings.  Her blood pressure log shows blood pressures starting now in the low 100s and increasing over the weekend to the mid 140s and occasionally in the 160s.  She states that she can tell when her blood pressure is elevated due to a hot left year.  We will increase her carvedilol to 12.5 mg in the Hull and continue her 6.25 mg afternoon/evening dose.  She reports compliance with a heart healthy low-sodium diet and continues to walk daily.  I will have her maintain her blood pressure log.  She will be traveling to [place] to vacation with family next week.  I will have her follow-up in 3 to 4 months.  She is considering having lower GI surgery at the end of December or  beginning of January.  I have asked her to have the GI office send [place] a preoperative request if she moves forward with the surgery.  Today she denies  chest pain, shortness of breath, lower extremity edema, fatigue, palpitations, melena, hematuria, hemoptysis, diaphoresis, weakness, presyncope, syncope, orthopnea, and PND.   Home Medications    Prior to Admission medications   Medication Sig Start Date End Date Taking? Authorizing Provider  albuterol (VENTOLIN HFA) 108 (90 Base) MCG/ACT inhaler Inhale 2 puffs into the lungs every 4 (four) hours as needed for wheezing or shortness of breath. 06/25/20   Margarette Canada, NP  Alum & Mag Hydroxide-Simeth (ANTACID ADVANCED PO) Take 1 tablet by mouth daily as needed (indigestion).     [provider]  aspirin EC 81 MG tablet Take 1 tablet (81 mg total) by mouth daily. Swallow whole. 09/03/20   Deberah Pelton, NP  benzonatate (TESSALON) 200 MG capsule Take 200 mg by mouth 3 (three) times daily as needed for cough (cough).    [provider]  carvedilol (COREG) 12.5 MG tablet Take 1 tablet (12.5 mg total) by mouth 2 (two) times daily with a meal. 11/17/20   Akshita Italiano, Jossie Ng, NP  cetirizine (ZYRTEC) 10 MG tablet Take 10 mg by mouth daily.    [provider]  Cholecalciferol (VITAMIN D3 PO) Take 50 mg by mouth daily.    [provider]  diphenhydrAMINE (BENADRYL) 25 MG tablet Take 2 tablets by mouth as needed for anaphylaxis.    [provider]  EPINEPHrine 0.3 mg/0.3 mL IJ SOAJ injection Inject 0.3 mLs (0.3 mg total) into the muscle once as needed (anaphylaxis). 12/11/15   Patrecia Pour, MD  hydrALAZINE (APRESOLINE) 25 MG tablet TAKE 1 TABLET THREE TIMES DAILY AS NEEDED FOR SYSTOLIC BLOOD PRESSURE GREATER THAN 150 (NEED MD APPOINTMENT) 12/10/20   Deberah Pelton, NP  isosorbide mononitrate (IMDUR) 30 MG 24 hr tablet Take 1 tablet (30 mg total) by mouth daily. 11/17/20   Deberah Pelton, NP  montelukast (SINGULAIR) 10 MG  tablet Take 10 mg by mouth at bedtime.    [provider]  Multiple Vitamins-Minerals (AIRBORNE PO) Take 1 tablet by mouth daily.    [provider]  Probiotic Product (PROBIOTIC DAILY PO) Take by mouth.    [provider]  simvastatin (ZOCOR) 40 MG tablet TAKE 1 TABLET EVERY DAY 06/01/20   Lorretta Harp, MD  Spacer/Aero-Holding Chambers (AEROCHAMBER MV) inhaler Use as instructed 06/25/20   Margarette Canada, NP  torsemide (DEMADEX) 20 MG tablet Take 1 tablet (20 mg total) by mouth in the Hull AND 0.5 tablets (10 mg total) daily at 2 PM. 11/17/20 02/15/21  Desyre Calma, Jossie Ng, NP    Family History    Family History  Problem Relation Age of Onset   Heart disease Brother        MI @ 75, HTN @ 81, DM @ 12    Heart disease Father        MI   Heart disease Mother        MI, CVA @ 35   Heart disease Sister        CHF, CVA   Breast cancer Sister 33   Esophageal cancer Brother    Alzheimer's disease Sister    Heart disease Brother        MI in 34s, lung cancer   Heart disease Sister    Hypertension Sister        x2   Hyperlipidemia Sister        x2   Diabetes Sister        x2   Cancer Sister  Hypertension Brother    Colon cancer Neg Hx    She indicated that her mother is deceased. She indicated that her father is deceased. She indicated that two of her ten sisters are alive. She indicated that only one of her six brothers is alive. She indicated that her maternal grandmother is deceased. She indicated that her maternal grandfather is deceased. She indicated that her paternal grandmother is deceased. She indicated that her paternal grandfather is deceased. She indicated that the status of her neg hx is unknown.  Social History    Social History   Socioeconomic History   Marital status: Single    Spouse name: Not on file   Number of children: 0   Years of education: 12+   Highest education level: Not on file  Occupational History   Occupation: Reitred   Tobacco Use   Smoking status: Former    Packs/day: 0.25    Years: 15.00    Pack years: 3.75    Types: Cigarettes    Quit date: 02/04/2016    Years since quitting: 5.0   Smokeless tobacco: Never  Vaping Use   Vaping Use: Never used  Substance and Sexual Activity   Alcohol use: Not Currently    Comment: occasional - tequila shots on holidays   Drug use: No   Sexual activity: Not on file  Other Topics Concern   Not on file  Social History Narrative   Lives alone    Caffeine use: 2 cups coffee per day (mixed decaf/regular)   Social Determinants of Health   Financial Resource Strain: Not on file  Food Insecurity: Not on file  Transportation Needs: Not on file  Physical Activity: Not on file  Stress: Not on file  Social Connections: Not on file  Intimate Partner Violence: Not on file     Review of Systems    General:  No chills, fever, night sweats or weight changes.  Cardiovascular:  No chest pain, dyspnea on exertion, edema, orthopnea, palpitations, paroxysmal nocturnal dyspnea. Dermatological: No rash, lesions/masses Respiratory: No cough, dyspnea Urologic: No hematuria, dysuria Abdominal:   No nausea, vomiting, diarrhea, bright red blood per rectum, melena, or hematemesis Neurologic:  No visual changes, wkns, changes in mental status. All other systems reviewed and are otherwise negative except as noted above.  Physical Exam    VS:  BP (!) 112/56 (BP Location: Left Arm, Patient Position: Sitting, Cuff Size: Normal)   Pulse 74   Ht 5\' 5"  (1.651 m)   Wt 174 lb (78.9 kg)   SpO2 96%   BMI 28.96 kg/m  , BMI Body mass index is 28.96 kg/m. GEN: Well nourished, well developed, in no acute distress. HEENT: normal. Neck: Supple, no JVD, carotid bruits, or masses. Cardiac: RRR, no murmurs, rubs, or gallops. No clubbing, cyanosis, edema.  Radials/DP/PT 2+ and equal bilaterally.  Respiratory:  Respirations regular and unlabored, clear to auscultation bilaterally. GI:  Soft, nontender, nondistended, BS + x 4. MS: no deformity or atrophy. Skin: warm and dry, no rash. Neuro:  Strength and sensation are intact. Psych: Normal affect.  Accessory Clinical Findings    Recent Labs: 11/23/2020: BUN 15; Creatinine, Ser 0.96; Potassium 3.9; Sodium 140   Recent Lipid Panel    Component Value Date/Time   CHOL 129 01/04/2016 0853   TRIG 104 01/04/2016 0853   HDL 51 01/04/2016 0853   CHOLHDL 2.5 01/04/2016 0853   VLDL 21 01/04/2016 0853   LDLCALC 57 01/04/2016 0853    ECG personally  reviewed by me today-none today.  Echocardiogram 06/06/2018 Study Conclusions   - Left ventricle: The cavity size was normal. There was mild    concentric hypertrophy. Systolic function was normal. The    estimated ejection fraction was in the range of 60% to 65%. Wall    motion was normal; there were no regional wall motion    abnormalities. Doppler parameters are consistent with abnormal    left ventricular relaxation (grade 1 diastolic dysfunction).  - Mitral valve: Calcified annulus. Mildly thickened leaflets .  - Atrial septum: No defect or patent foramen ovale was identified.   Nuclear stress test 06/14/2018 Nuclear stress EF: 73%. The left ventricular ejection fraction is hyperdynamic (>65%). There was no ST segment deviation noted during stress. The study is normal. This is a low risk study.   Normal stress nuclear study with no ischemia or infarction.  Gated ejection fraction 73% with normal wall motion.  Assessment & Plan   1.  Essential hypertension-BP today 112/56    Contacted nurse triage line on 03/01/2021 with complaints of low blood pressures post COVID.  I instructed her to continue her carvedilol at a reduced dose 6.25 mg twice daily, continue Imdur, and will use hydralazine for systolic blood pressures greater than 150. Continue Imdur, hydralazine,  Continue carvedilol 12.5 mg in the Hull and 6.2 5 in the evening. Heart healthy low-sodium diet-salty  6 given Increase physical activity as tolerated Maintain blood pressure log  Chronic diastolic CHF-euvolemic today.  Weight today 174 pounds.   Has returned to baseline activity.  Nuclear stress test shows EF of 73 on 06/14/2018. Continue, carvedilol, Imdur, hydralazine Continue torsemide 20 mg a.m. and 10 mg p.m. Heart healthy low-sodium diet-salty 6 given Increase physical activity as tolerated Continue fluid restriction 60-48 ounces daily Continue Daily weights-dry weight around 175 pounds  Coronary artery disease-denies recent episodes of chest pain.  Cardiac catheterization 8/12 showed 60% mid LAD stenosis.  Nuclear stress test 1/20 showed low risk and no ischemia. Continue carvedilol, aspirin, Imdur, simvastatin Heart healthy low-sodium diet-salty 6 given Increase physical activity as tolerated   Paroxysmal SVT-unaware of recent accelerated or irregular heartbeats.   Continues with rare occasional  episodes of palpitations or heart flutter. Continue carvedilol Heart healthy low-sodium diet Avoid triggers caffeine, chocolate, EtOH etc.   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 Episode required 48 hours of intubation.  Allergy testing subsequently showed allergies to ARB/ACE and red meat.     Disposition: Follow-up with Dr. Gwenlyn Hull or me in 3-4 months.   Jossie Ng. Jeymi Hepp NP-C    03/08/2021, 9:36 AM [place] [address] Office [phone] Fax [phone]  Notice: This dictation was prepared with Dragon dictation along with smaller phrase technology. Any transcriptional errors that result from this process are unintentional and may not be corrected upon review.  I spent 13 minutes examining this patient, reviewing medications, and using patient centered shared decision making involving her cardiac care.  Prior to her visit I spent greater than 20 minutes reviewing her past medical history,  medications, and  prior cardiac tests.

## 2021-03-08 ENCOUNTER — Ambulatory Visit: Payer: Medicare HMO | Admitting: General Practice

## 2021-03-08 ENCOUNTER — Encounter: Payer: Self-pay | Admitting: General Practice

## 2021-03-08 ENCOUNTER — Other Ambulatory Visit: Payer: Self-pay

## 2021-03-08 VITALS — BP 112/56 | HR 74 | Ht 65.0 in | Wt 174.0 lb

## 2021-03-08 DIAGNOSIS — I10 Essential (primary) hypertension: Secondary | ICD-10-CM | POA: Diagnosis not present

## 2021-03-08 DIAGNOSIS — I471 Supraventricular tachycardia: Secondary | ICD-10-CM

## 2021-03-08 DIAGNOSIS — I5032 Chronic diastolic (congestive) heart failure: Secondary | ICD-10-CM

## 2021-03-08 DIAGNOSIS — I2510 Atherosclerotic heart disease of native coronary artery without angina pectoris: Secondary | ICD-10-CM | POA: Diagnosis not present

## 2021-03-08 MED ORDER — CARVEDILOL 12.5 MG PO TABS: ORAL_TABLET | ORAL | 3 refills | Status: DC

## 2021-03-08 NOTE — Patient Instructions (Signed)
Medication Instructions:  .INCREASE CARVEDILOL 12.5MG (1TAB) IN THE AM AND 6.25MG (1/2 TAB) IN THE PM.  *If you need a refill on your cardiac medications before your next appointment, please call your pharmacy*  Lab Work:   Testing/Procedures:  NONE    NONE  Special Instructions PLEASE TAKE AND LOG YOU BLOOD PRESSURE, TAKE YOUR BLOOD PRESSURE AT LEAST 1 HOUR AFTER TAKING YOUR MEDICATION.   BRING LOG TO YOUR FOLLOW UP APPOINTMENT  PLEASE MAINTAIN PHYSICAL ACTIVITY AS TOLERATED,   Follow-Up: Your next appointment:  3 month(s) In Person with Quay Burow, MD   At [facility], you and your health needs are our priority.  As part of our continuing mission to provide you with exceptional heart care, we have created designated Provider Care Teams.  These Care Teams include your primary Cardiologist (physician) and Advanced Practice Providers (APPs -  Physician Assistants and Nurse Practitioners) who all work together to provide you with the care you need, when you need it.

## 2021-03-11 DIAGNOSIS — I519 Heart disease, unspecified: Secondary | ICD-10-CM | POA: Diagnosis not present

## 2021-03-11 DIAGNOSIS — I10 Essential (primary) hypertension: Secondary | ICD-10-CM | POA: Diagnosis not present

## 2021-03-11 DIAGNOSIS — E119 Type 2 diabetes mellitus without complications: Secondary | ICD-10-CM | POA: Diagnosis not present

## 2021-03-11 DIAGNOSIS — E785 Hyperlipidemia, unspecified: Secondary | ICD-10-CM | POA: Diagnosis not present

## 2021-03-22 ENCOUNTER — Ambulatory Visit: Payer: Medicare HMO | Admitting: Medical

## 2021-03-25 ENCOUNTER — Other Ambulatory Visit: Payer: Self-pay | Admitting: Cardiovascular Disease

## 2021-04-03 DIAGNOSIS — J45998 Other asthma: Secondary | ICD-10-CM | POA: Diagnosis not present

## 2021-04-11 DIAGNOSIS — I519 Heart disease, unspecified: Secondary | ICD-10-CM | POA: Diagnosis not present

## 2021-04-11 DIAGNOSIS — I10 Essential (primary) hypertension: Secondary | ICD-10-CM | POA: Diagnosis not present

## 2021-04-11 DIAGNOSIS — E119 Type 2 diabetes mellitus without complications: Secondary | ICD-10-CM | POA: Diagnosis not present

## 2021-04-11 DIAGNOSIS — E785 Hyperlipidemia, unspecified: Secondary | ICD-10-CM | POA: Diagnosis not present

## 2021-04-12 ENCOUNTER — Ambulatory Visit: Payer: Medicare HMO | Admitting: General Practice

## 2021-04-12 DIAGNOSIS — N814 Uterovaginal prolapse, unspecified: Secondary | ICD-10-CM | POA: Diagnosis not present

## 2021-04-12 DIAGNOSIS — K469 Unspecified abdominal hernia without obstruction or gangrene: Secondary | ICD-10-CM | POA: Diagnosis not present

## 2021-04-12 DIAGNOSIS — N8110 Cystocele, unspecified: Secondary | ICD-10-CM | POA: Diagnosis not present

## 2021-04-19 ENCOUNTER — Other Ambulatory Visit: Payer: Self-pay

## 2021-04-19 ENCOUNTER — Ambulatory Visit: Payer: Medicare HMO | Admitting: Allergy and Immunology

## 2021-04-19 ENCOUNTER — Telehealth: Payer: Self-pay | Admitting: *Deleted

## 2021-04-19 ENCOUNTER — Telehealth: Payer: Self-pay

## 2021-04-19 VITALS — BP 124/82 | HR 75 | Temp 97.7°F | Resp 18 | Ht 65.5 in | Wt 172.6 lb

## 2021-04-19 DIAGNOSIS — J324 Chronic pansinusitis: Secondary | ICD-10-CM | POA: Diagnosis not present

## 2021-04-19 DIAGNOSIS — J4550 Severe persistent asthma, uncomplicated: Secondary | ICD-10-CM | POA: Diagnosis not present

## 2021-04-19 DIAGNOSIS — R011 Cardiac murmur, unspecified: Secondary | ICD-10-CM

## 2021-04-19 DIAGNOSIS — K219 Gastro-esophageal reflux disease without esophagitis: Secondary | ICD-10-CM | POA: Diagnosis not present

## 2021-04-19 DIAGNOSIS — J3089 Other allergic rhinitis: Secondary | ICD-10-CM

## 2021-04-19 DIAGNOSIS — J339 Nasal polyp, unspecified: Secondary | ICD-10-CM

## 2021-04-19 MED ORDER — FAMOTIDINE 40 MG PO TABS: 40.0000 mg | ORAL_TABLET | Freq: Every evening | ORAL | 5 refills | Status: DC

## 2021-04-19 MED ORDER — OMEPRAZOLE 40 MG PO CPDR: 40.0000 mg | DELAYED_RELEASE_CAPSULE | Freq: Every morning | ORAL | 5 refills | Status: DC

## 2021-04-19 MED ORDER — BREZTRI AEROSPHERE 160-9-4.8 MCG/ACT IN AERO: 2.0000 | INHALATION_SPRAY | Freq: Two times a day (BID) | RESPIRATORY_TRACT | 5 refills | Status: DC

## 2021-04-19 MED ORDER — FLUTICASONE PROPIONATE 50 MCG/ACT NA SUSP: 2.0000 | Freq: Every day | NASAL | 5 refills | Status: DC

## 2021-04-19 NOTE — Telephone Encounter (Signed)
Per Dr.Kozlow patient is to start Berna Bue within the next few weeks. Patient has signed consent. Patient is going to look at her calender as she is going to be traveling for the holidays and doesn't want to start her injections and be off schedule. Patient will wait to hear from Tammy and move forward with her appointment then.

## 2021-04-19 NOTE — Telephone Encounter (Signed)
Patient was seen by Dr Neldon Mc today and called me regarding starting Berna Bue and affordability. I advised patient that I would mail consent for patient assistance program [place] & [place] to patient and be in touch regarding status once submitted

## 2021-04-19 NOTE — Progress Notes (Signed)
[place] [facility] [place] [facility] [place] [facility] [place] [facility] [place]   Dear Dr. Theda Sers,  Thank you for referring Megan Hull to the [facility] on 04/19/2021.   Below is a summation of this patient's evaluation and recommendations.  Thank you for your referral. I will keep you informed about this patient's response to treatment.   If you have any questions please do not hesitate to contact me.   Sincerely,  Jiles Prows, MD Allergy / Immunology [facility]   ______________________________________________________________________    NEW PATIENT NOTE  Referring Provider: [name], DO Primary Provider: [name], DO Date of office visit: 04/19/2021    Subjective:   Chief Complaint:  Megan Hull (DOB: Mar 04, 1941) is a 80 y.o. female who presents to the clinic on 04/19/2021 with a chief complaint of Cough (Patient in today to as a referral for her asthma and allergies. She is positive for Alpha Gal since 2017 but recently her asthma symptoms have gotten worse.) .     HPI: Rusti presents to this clinic in evaluation of allergic disease.  Apparently she has a long history of nasal congestion and sneezing and attempted a course of immunotherapy at the age of 1 but had an adverse effect from that treatment and never continued with immunotherapy.  She then required polypectomy with Dr. Kaci Holster, ENT, 15 years ago and in 2020 she required another polypectomy with Dr. Wilburn Cornelia, ENT.  The polypectomy did help with her nasal congestion.  But she cannot smell and has not been able to smell for 4 years or so.  She continues on Flonase and montelukast daily.  Her last evaluation with Dr. Wilburn Cornelia was 2 months ago and apparently everything checked out okay.  In 2017 she apparently had a very severe swelling reaction involving her airway in the context of utilizing an ACE inhibitor.   Apparently she ate at [employer] and had chicken cooked in animal fat prior to that reaction and for some reason she was diagnosed with alpha gal reactivity that was complicated by her ACE inhibitor.  Apparently she has an elevated alpha gal titer.  She has been mammal free since that point in time.  She has also noticed that since 2017 she has been having problems with asthma.  She has wheezing and coughing and shortness of breath and she gets short of breath if she exerts herself.  She uses a combination of albuterol and nebulized budesonide twice a day and has been doing so for the past year.  She also has throat clearing and mucus stuck in her throat and intermittent hoarseness and she cannot sing anymore.  She does have reflux disease and required a dilation of her esophagus 8 years ago and her swallowing issue resolved with the dilation.  She still has some occasional regurgitation up to her mouth using on a monthly basis especially if she overeats.  She has 1 and half cups of a 50% caffeine containing coffee in the morning and has chocolate twice a week.  She informs me that she has 15-year history of smoking around a half a pack a day which she discontinued in 2017.  She also informs me that she has a thickened cardiac septum and may have congestive heart failure.  She has received 2 COVID vaccinations and contracted COVID in September 2022 manifested as a fever and achiness for about 24 hours.  She has received this  year's flu vaccine.  Past Medical History:  Diagnosis Date   Arthritis    CHF (congestive heart failure) (HCC)    Diabetes mellitus without complication (HCC)    borderline   Diarrhea    Diverticulosis    Dysrhythmia    controlled with Metoprolol-12-19-13 LOV -Dr. Gwenlyn Found sees yearly   Family history of premature CAD    GERD (gastroesophageal reflux disease)    Heart murmur    yrs ago   HLD (hyperlipidemia)    pt reports cholesterol normal now (07/21/19).   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 Adora Fridge)   CATARACT EXTRACTION, BILATERAL     most recent 11/19   COLONOSCOPY     ESOPHAGEAL DILATION  2014   infected lymph node surgery  80's   NM MYOCAR PERF WALL MOTION  04/2011   bruce myoview; normal pattern of perfusion, EF 80%, low risk scan   SINUS ENDO WITH FUSION Bilateral 03/04/2020   Procedure: SINUS ENDOSCOPY WITH FUSION NAVIGATION;  Surgeon: Jerrell Belfast, MD;  Location: [facility];  Service: ENT;  Laterality: Bilateral;   TONSILLECTOMY  70's   TRANSTHORACIC ECHOCARDIOGRAM  12/2007   mild conc LVH   TURBINATE REDUCTION Bilateral 03/04/2020   Procedure: TURBINATE REDUCTION;  Surgeon: Jerrell Belfast, MD;  Location: [facility];  Service: ENT;  Laterality: Bilateral;    Allergies as of 04/19/2021       Reactions   Nexium [esomeprazole Magnesium] Anaphylaxis   Pt states she was unable to swallow for several hours after taking the nexium   Other Hives, Shortness Of Breath, Other (See Comments)   Red Fish (Hives) [place]'s chicken (swelling and shortness of breath) PAPRIKA   Codeine Other (See Comments)   REACTION: chest pain   Ace Inhibitors Swelling   Angioedema   Latex Other (See Comments)   Causes skin to turn red per patient    Monosodium Glutamate Other (See Comments)   dizziness   Amlodipine Other (See Comments)   Lower extremity swelling, itching   Shellfish Allergy Hives   Tape Rash   Coban wrap turned  her arm red (after stress test)        Medication List    AeroChamber MV inhaler Use as instructed   AIRBORNE PO Take 1 tablet by mouth daily.   albuterol 108 (90 Base) MCG/ACT inhaler Commonly known as: VENTOLIN HFA Inhale 2 puffs into the lungs every 4 (four) hours as needed for wheezing or shortness of breath.   ANTACID ADVANCED PO Take 1 tablet by mouth daily as needed (indigestion).   aspirin EC 81 MG tablet Take 1 tablet (81 mg total) by mouth daily. Swallow whole.   benzonatate 200 MG capsule Commonly known as: TESSALON Take 200 mg by mouth 3 (three) times daily as needed for cough (cough).   budesonide 0.25 MG/2ML nebulizer solution Commonly known as: PULMICORT Take by nebulization 2 (two) times daily.   carvedilol 12.5 MG tablet Commonly known as: COREG Take 1 tablet (12.5  mg total) by mouth every morning AND 0.5 tablets (6.25 mg total) every evening.   cetirizine 10 MG tablet Commonly known as: ZYRTEC Take 10 mg by mouth daily.   diphenhydrAMINE 25 MG tablet Commonly known as: BENADRYL Take 2 tablets by mouth as needed for anaphylaxis.   EPINEPHrine 0.3 mg/0.3 mL Soaj injection Commonly known as: EPI-PEN Inject 0.3 mLs (0.3 mg total) into the muscle once as needed (anaphylaxis).   Fluticasone Furoate 50 MCG/ACT Aepb Inhale into the lungs.   hydrALAZINE 25 MG tablet Commonly known as: APRESOLINE TAKE 1 TABLET THREE TIMES DAILY AS NEEDED FOR SYSTOLIC BLOOD PRESSURE GREATER THAN 150 (NEED MD APPOINTMENT)   isosorbide mononitrate 30 MG 24 hr tablet Commonly known as: IMDUR Take 1 tablet (30 mg total) by mouth daily.   montelukast 10 MG tablet Commonly known as: SINGULAIR Take 10 mg by mouth at bedtime.   PROBIOTIC DAILY PO Take by mouth.   simvastatin 40 MG tablet Commonly known as: ZOCOR TAKE 1 TABLET EVERY DAY   torsemide 20 MG tablet Commonly known as: DEMADEX Take 1 tablet (20 mg total) by mouth in the morning AND 0.5 tablets (10 mg  total) daily at 2 PM.   VITAMIN D3 PO Take 50 mg by mouth daily.    Review of systems negative except as noted in HPI / PMHx or noted below:  Review of Systems  Constitutional: Negative.   HENT: Negative.    Eyes: Negative.   Respiratory: Negative.    Cardiovascular: Negative.   Gastrointestinal: Negative.   Genitourinary: Negative.   Musculoskeletal: Negative.   Skin: Negative.   Neurological: Negative.   Endo/Heme/Allergies: Negative.   Psychiatric/Behavioral: Negative.     Family History  Problem Relation Age of Onset   Heart disease Brother        MI @ 73, HTN @ 58, DM @ 37    Heart disease Father        MI   Heart disease Mother        MI, CVA @ 34   Heart disease Sister        CHF, CVA   Breast cancer Sister 71   Esophageal cancer Brother    Alzheimer's disease Sister    Heart disease Brother        MI in 76s, lung cancer   Heart disease Sister    Hypertension Sister        x2   Hyperlipidemia Sister        x2   Diabetes Sister        x2   Cancer Sister    Hypertension Brother    Colon cancer Neg Hx     Social History   Socioeconomic History   Marital status: Single    Spouse name: Not on file   Number of children: 0   Years of education: 12+   Highest education level: Not on file  Occupational History   Occupation: [employer]  Tobacco Use   Smoking status: Former    Packs/day: 0.25    Years: 15.00    Pack years: 3.75    Types: Cigarettes    Quit date: 02/04/2016    Years since quitting: 5.2   Smokeless tobacco: Never  Vaping Use   Vaping Use: Never used  Substance and Sexual Activity   Alcohol use: Not Currently    Comment: occasional - tequila shots on holidays   Drug use: No   Sexual activity: Not on file  Other Topics  Concern   Not on file  Social History Narrative   Lives alone    Caffeine use: 2 cups coffee per day (mixed decaf/regular)   Environmental and Social history  Lives in a house with a dry environment, no animals  located inside the household, no carpet in the bedroom, no plastic on the bed, no plastic on the pillow, no smoking ongoing with inside the household.  Objective:   Vitals:   04/19/21 0930  BP: 124/82  Pulse: 75  Resp: 18  Temp: 97.7 F (36.5 C)  SpO2: 96%   Height: 5' 5.5" (166.4 cm) Weight: 172 lb 9.6 oz (78.3 kg)  Physical Exam Constitutional:      Appearance: She is not diaphoretic.     Comments: Throat clearing like cough  HENT:     Head: Normocephalic.     Right Ear: Tympanic membrane, ear canal and external ear normal.     Left Ear: Tympanic membrane, ear canal and external ear normal.     Nose: Nose normal. No mucosal edema or rhinorrhea.     Mouth/Throat:     Pharynx: Uvula midline. No oropharyngeal exudate.  Eyes:     Conjunctiva/sclera: Conjunctivae normal.  Neck:     Thyroid: No thyromegaly.     Trachea: Trachea normal. No tracheal tenderness or tracheal deviation.  Cardiovascular:     Rate and Rhythm: Normal rate and regular rhythm.     Heart sounds: S1 normal and S2 normal. Murmur (Systolic) heard.  Pulmonary:     Effort: No respiratory distress.     Breath sounds: Normal breath sounds. No stridor. No wheezing or rales.  Lymphadenopathy:     Head:     Right side of head: No tonsillar adenopathy.     Left side of head: No tonsillar adenopathy.     Cervical: No cervical adenopathy.  Skin:    Findings: No erythema or rash.     Nails: There is no clubbing.  Neurological:     Mental Status: She is alert.    Diagnostics: Allergy skin tests were performed.  She demonstrated hypersensitivity to house dust mite, cat, and dog.  She did not demonstrate any hypersensitivity against foods including pork, lamb, beef  Spirometry was performed and demonstrated an FEV1 of 0.96 @ 88 % of predicted. FEV1/FVC = 0.68  Results of blood tests obtained 26 January 2021 identifies WBC 5.2, absolute eosinophil 700, absolute lymphocyte 1900, hemoglobin 13.0, platelet  211.  Results of blood tests obtained 13 April 2020 identified alpha gal IgE 1.06 KU/L.  Results of blood tests obtained 23 November 2016 identified alpha gal IgE 11.2 KU/L.  Results of a chest x-ray obtained 28 August 2020 identified the following:  Mildly hypoinflated lungs with streaky bibasilar opacities  Mild blunting of the left costophrenic angle. No right pleural effusion. No pneumothorax.  Stable cardiomediastinal silhouette.   Results of a sinus CT scan obtained 24 February 2020 identified the following:  Frontal: Persistent complete opacification of the right frontal  sinus and right frontal sinus drainage pathway. Moderate left  frontal sinus mucosal thickening, improved from prior.   Ethmoid: Unchanged extensive mucosal thickening bilaterally  including complete opacification on the right.   Maxillary: Persistent complete opacification of the right maxillary  sinus. Increased circumferential mucosal thickening on the left.   Sphenoid: Unchanged complete opacification of right sphenoid sinus  and moderate mucosal thickening in the left sphenoid sinus with  bilateral ostial occlusion.   Right ostiomeatal unit: Unchanged complete opacification.  Left ostiomeatal unit: Patent maxillary antrostomy.   Nasal passages: Unchanged polypoid soft tissue resulting in  opacification of the majority of the right nasal cavity with milder  polypoid soft tissue superiorly on the left. 3 mm rightward nasal  septal deviation.   Anatomy: Pneumatization superior to the anterior ethmoid notch on  the right. Keros II. Sellar sphenoid pneumatization pattern. No  dehiscence of carotid or optic canals. No onodi cell.  Results of an echocardiogram obtained 06 June 2018 identified the following:  - Left ventricle: The cavity size was normal. There was mild   concentric hypertrophy. Systolic function was normal. The  estimated ejection fraction was in the range of 60% to 65%. Wall    motion was normal; there were no regional wall motion   abnormalities. Doppler parameters are consistent with abnormal   left ventricular relaxation (grade 1 diastolic dysfunction). - Mitral valve: Calcified annulus. Mildly thickened leaflets . - Atrial septum: No defect or patent foramen ovale was identified.   Results of an ENT evaluation dated 11 January 2021 identifies the following:  Widely patent sinonasal passageway after previous endoscopic sinus surgery, no evidence of recurrent polyposis or obstruction. Minimal dry crusting. No mucosal abnormality, excoriation or evidence of bleeding. Normal middle and superior turbinates, sinus ostia patent without obstruction, mass or discharge. Nasopharynx patent.   Assessment and Plan:    1. Not well controlled severe persistent asthma   2. Perennial allergic rhinitis   3. Chronic pansinusitis   4. Nasal polyposis   5. LPRD (laryngopharyngeal reflux disease)   6. Murmur     1.  Allergen avoidance measures - dust mite, cat, dog, mammal consumption  2.  Treat and prevent inflammation:  A. Breztri - 2 inhalations 2 times per day w/spacer (empty lungs) B. Flonase / Nasacort - 1-2 sprays each nostril 2 times per day C. Montelukast 10 mg - 1 tablet 1 time per day  3.  Treat and prevent reflux/LPR:  A. Minimize caffeine / chocolate consumption B. Omeprazole 40 mg - 1 tablet in AM C. Famotidine 40 mg - 1 tablet in PM  4.  If needed:  A. Albuterol MDI or nebulization B. Nasal saline C. Antihistamine D. Epi-Pen  5.  Submit for benralizumab injections  6.  Obtain 2D echocardiogram for murmur  7.  Return to clinic in 4 weeks or earlier if problem  Aquanetta has an inflamed and irritated respiratory track with contribution from atopic disease, a previous history of tobacco smoking, and reflux induced respiratory disease.  Her inflammation occurs in the context of significant eosinophilia.  I do not see a documented ANCA blood test in her  electronic medical record and I will seek out if the study has been performed in the past.  She will utilize a combination of anti-inflammatory agents for her airway and therapy directed against LPR and we will submit for benralizumab approval from her insurance company.  As well, she has a significant murmur and her last echocardiogram was over 3 years ago and we will follow-up that murmur with a another echo which certainly needs to be performed in the context of her eosinophilia.  We can work through her alpha gal issue during her next visit.  I will see her back in this clinic in 4 weeks or earlier if there is a problem.  Jiles Prows, MD Allergy / Immunology [facility]

## 2021-04-19 NOTE — Patient Instructions (Addendum)
  1.  Allergen avoidance measures - dust mite, cat, dog, mammal consumption  2.  Treat and prevent inflammation:  A. Breztri - 2 inhalations 2 times per day w/spacer (empty lungs) B. Flonase / Nasacort - 1-2 sprays each nostril 2 times per day C. Montelukast 10 mg - 1 tablet 1 time per day  3.  Treat and prevent reflux/LPR:  A. Minimize caffeine / chocolate consumption B. Omeprazole 40 mg - 1 tablet in AM C. Famotidine 40 mg - 1 tablet in PM  4.  If needed:  A. Albuterol MDI or nebulization B. Nasal saline C. Antihistamine D. Epi-Pen  5.  Submit for benralizumab injections  6.  Obtain 2D echocardiogram for murmur  7.  Return to clinic in 4 weeks or earlier if problem

## 2021-04-20 ENCOUNTER — Encounter: Payer: Self-pay | Admitting: Allergy and Immunology

## 2021-04-20 NOTE — Telephone Encounter (Signed)
Already spoke to patient and mailed app for [facility]

## 2021-04-29 ENCOUNTER — Other Ambulatory Visit: Payer: Self-pay | Admitting: *Deleted

## 2021-04-29 MED ORDER — FAMOTIDINE 40 MG PO TABS: 40.0000 mg | ORAL_TABLET | Freq: Every evening | ORAL | 1 refills | Status: DC

## 2021-04-29 MED ORDER — OMEPRAZOLE 40 MG PO CPDR
40.0000 mg | DELAYED_RELEASE_CAPSULE | Freq: Every morning | ORAL | 1 refills | Status: DC
Start: 2021-04-29 — End: 2021-05-24

## 2021-04-29 MED ORDER — BREZTRI AEROSPHERE 160-9-4.8 MCG/ACT IN AERO
2.0000 | INHALATION_SPRAY | Freq: Two times a day (BID) | RESPIRATORY_TRACT | 1 refills | Status: DC
Start: 2021-04-29 — End: 2021-05-24

## 2021-05-10 ENCOUNTER — Other Ambulatory Visit: Payer: Self-pay | Admitting: Family Medicine

## 2021-05-10 DIAGNOSIS — Z1231 Encounter for screening mammogram for malignant neoplasm of breast: Secondary | ICD-10-CM

## 2021-05-11 DIAGNOSIS — I519 Heart disease, unspecified: Secondary | ICD-10-CM | POA: Diagnosis not present

## 2021-05-11 DIAGNOSIS — E785 Hyperlipidemia, unspecified: Secondary | ICD-10-CM | POA: Diagnosis not present

## 2021-05-11 DIAGNOSIS — I10 Essential (primary) hypertension: Secondary | ICD-10-CM | POA: Diagnosis not present

## 2021-05-11 DIAGNOSIS — E119 Type 2 diabetes mellitus without complications: Secondary | ICD-10-CM | POA: Diagnosis not present

## 2021-05-24 ENCOUNTER — Ambulatory Visit: Payer: Medicare HMO | Admitting: Allergy and Immunology

## 2021-05-24 ENCOUNTER — Other Ambulatory Visit: Payer: Self-pay

## 2021-05-24 VITALS — BP 130/82 | HR 74 | Temp 97.3°F | Resp 16 | Ht 65.5 in | Wt 169.4 lb

## 2021-05-24 DIAGNOSIS — J4550 Severe persistent asthma, uncomplicated: Secondary | ICD-10-CM | POA: Diagnosis not present

## 2021-05-24 DIAGNOSIS — Z801 Family history of malignant neoplasm of trachea, bronchus and lung: Secondary | ICD-10-CM

## 2021-05-24 DIAGNOSIS — J3089 Other allergic rhinitis: Secondary | ICD-10-CM

## 2021-05-24 DIAGNOSIS — K219 Gastro-esophageal reflux disease without esophagitis: Secondary | ICD-10-CM | POA: Diagnosis not present

## 2021-05-24 DIAGNOSIS — J324 Chronic pansinusitis: Secondary | ICD-10-CM | POA: Diagnosis not present

## 2021-05-24 DIAGNOSIS — J339 Nasal polyp, unspecified: Secondary | ICD-10-CM | POA: Diagnosis not present

## 2021-05-24 DIAGNOSIS — T7800XA Anaphylactic reaction due to unspecified food, initial encounter: Secondary | ICD-10-CM

## 2021-05-24 MED ORDER — BREZTRI AEROSPHERE 160-9-4.8 MCG/ACT IN AERO
2.0000 | INHALATION_SPRAY | Freq: Two times a day (BID) | RESPIRATORY_TRACT | 1 refills | Status: DC
Start: 2021-05-24 — End: 2021-09-27

## 2021-05-24 MED ORDER — OMEPRAZOLE 40 MG PO CPDR: 40.0000 mg | DELAYED_RELEASE_CAPSULE | Freq: Every morning | ORAL | 1 refills | Status: DC

## 2021-05-24 MED ORDER — FLUTICASONE PROPIONATE 50 MCG/ACT NA SUSP
1.0000 | Freq: Every day | NASAL | 1 refills | Status: DC
Start: 2021-05-24 — End: 2021-09-27

## 2021-05-24 MED ORDER — FAMOTIDINE 40 MG PO TABS: 40.0000 mg | ORAL_TABLET | Freq: Every evening | ORAL | 1 refills | Status: DC

## 2021-05-24 MED ORDER — CETIRIZINE HCL 10 MG PO TABS
10.0000 mg | ORAL_TABLET | Freq: Two times a day (BID) | ORAL | 1 refills | Status: DC | PRN
Start: 2021-05-24 — End: 2021-08-12

## 2021-05-24 NOTE — Progress Notes (Signed)
[place]   Follow-up Note  Referring Provider: [name], DO Primary Provider: [name], DO Date of Office Visit: 05/24/2021  Subjective:   Megan Hull (DOB: 13-Dec-1940) is a 80 y.o. female who returns to the [facility] on 05/24/2021 in re-evaluation of the following:  HPI: Marquesa returns to this clinic in reevaluation of asthma, allergic rhinitis, chronic sinusitis, nasal polyposis, and LPR and history of alpha gal syndrome.  Her last visit to this clinic was 19 April 2021.  She has noticed significant improvement since her last visit regarding her breathing issues.  She believes that her lungs are the best that they have been in quite a prolonged period in time and she rarely uses a short acting bronchodilator at this point.  She continues to use a triple inhaler and is awaiting delivery of her benralizumab.  Her nose is also doing very well at this point although she still cannot smell very well.  She continues on a nasal steroid and a leukotriene modifier.  She still continues to have some problems with throat clearing and some mucus in her throat and may be there is some slight improvement.  She still has some intermittent hoarseness as well.  She has eliminated caffeine consumption and has eliminated chocolate consumption.  She continues on a proton pump inhibitor and H2 receptor blocker.  She has been performing house dust avoidance measures inside the household including changing and heating her linens and comforter.  She has eaten ham on 2 occasions without the development of an allergic reaction.  She still remains away from beef consumption.  Allergies as of 05/24/2021       Reactions   Nexium [esomeprazole Magnesium] Anaphylaxis   Pt states she was unable to swallow for several hours after taking the nexium   Other Hives, Shortness Of Breath, Other (See Comments)   Red Fish  (Hives) Bojangle's chicken (swelling and shortness of breath) PAPRIKA   Codeine Other (See Comments)   REACTION: chest pain   Ace Inhibitors Swelling   Angioedema   Latex Other (See Comments)   Causes skin to turn red per patient    Monosodium Glutamate Other (See Comments)   dizziness   Amlodipine Other (See Comments)   Lower extremity swelling, itching   Shellfish Allergy Hives   Tape Rash   Coban wrap turned her arm red (after stress test)        Medication List    AeroChamber MV inhaler Use as instructed   AIRBORNE PO Take 1 tablet by mouth daily.   albuterol 108 (90 Base) MCG/ACT inhaler Commonly known as: VENTOLIN HFA Inhale 2 puffs into the lungs every 4 (four) hours as needed for wheezing or shortness of breath.   ANTACID ADVANCED PO Take 1 tablet by mouth daily as needed (indigestion).   aspirin EC 81 MG tablet Take 1 tablet (81 mg total) by mouth daily. Swallow whole.   Breztri Aerosphere 160-9-4.8 MCG/ACT Aero Generic drug: Budeson-Glycopyrrol-Formoterol Inhale 2 puffs into the lungs in the morning and at bedtime.   carvedilol 12.5 MG tablet Commonly known as: COREG Take 1 tablet (12.5 mg total) by mouth every morning AND 0.5 tablets (6.25 mg total) every evening.   cetirizine 10 MG tablet Commonly known as: ZYRTEC Take 10 mg by mouth daily.   diphenhydrAMINE 25 MG tablet Commonly known as: BENADRYL Take 2 tablets by mouth as needed for anaphylaxis.   EPINEPHrine 0.3  mg/0.3 mL Soaj injection Commonly known as: EPI-PEN Inject 0.3 mLs (0.3 mg total) into the muscle once as needed (anaphylaxis).   famotidine 40 MG tablet Commonly known as: PEPCID Take 1 tablet (40 mg total) by mouth at bedtime.   Fluad Quadrivalent 0.5 ML injection Generic drug: influenza vaccine adjuvanted   fluticasone 50 MCG/ACT nasal spray Commonly known as: Flonase Place 2 sprays into both nostrils daily.   Fluticasone Furoate 50 MCG/ACT Aepb Inhale into the lungs.    hydrALAZINE 25 MG tablet Commonly known as: APRESOLINE TAKE 1 TABLET THREE TIMES DAILY AS NEEDED FOR SYSTOLIC BLOOD PRESSURE GREATER THAN 150 (NEED MD APPOINTMENT)   ipratropium-albuterol 0.5-2.5 (3) MG/3ML Soln Commonly known as: DUONEB   isosorbide mononitrate 30 MG 24 hr tablet Commonly known as: IMDUR Take 1 tablet (30 mg total) by mouth daily.   Lagevrio 200 MG Caps capsule Generic drug: molnupiravir EUA   montelukast 10 MG tablet Commonly known as: SINGULAIR Take 10 mg by mouth at bedtime.   omeprazole 40 MG capsule Commonly known as: PRILOSEC Take 1 capsule (40 mg total) by mouth every morning.   PROBIOTIC DAILY PO Take by mouth.   simvastatin 40 MG tablet Commonly known as: ZOCOR TAKE 1 TABLET EVERY DAY   torsemide 20 MG tablet Commonly known as: DEMADEX Take 1 tablet (20 mg total) by mouth in the morning AND 0.5 tablets (10 mg total) daily at 2 PM.   VITAMIN D3 PO Take 50 mg by mouth daily.    Past Medical History:  Diagnosis Date   Arthritis    CHF (congestive heart failure) (HCC)    Diabetes mellitus without complication (HCC)    borderline   Diarrhea    Diverticulosis    Dysrhythmia    controlled with Metoprolol-12-19-13 LOV -Dr. Gwenlyn Found sees yearly   Family history of premature CAD    GERD (gastroesophageal reflux disease)    Heart murmur    yrs ago   HLD (hyperlipidemia)    pt reports cholesterol normal now (07/21/19).   Hypertension    Lower extremity edema    Status post dilation of esophageal narrowing    Supraventricular arrhythmia    Tobacco abuse     Past Surgical History:  Procedure Laterality Date   ANTERIOR CERVICAL DECOMP/DISCECTOMY FUSION N/A 06/08/2015   Procedure: CERVICAL FIVE-SIX ,CERVICAL SIX-SEVEN ANTERIOR CERVICAL DECOMPRESSION/DISCECTOMY FUSION PLATING BONEGRAFT;  Surgeon: Kristeen Miss, MD;  Location: [facility];  Service: Neurosurgery;  Laterality: N/A;   CARDIAC CATHETERIZATION  01/15/2009   non-critical CAD, 60% mid LAD  lesion (Dr. Adora Fridge)   CATARACT EXTRACTION, BILATERAL     most recent 11/19   COLONOSCOPY     ESOPHAGEAL DILATION  2014   infected lymph node surgery  80's   NM MYOCAR PERF WALL MOTION  04/2011   bruce myoview; normal pattern of perfusion, EF 80%, low risk scan   SINUS ENDO WITH FUSION Bilateral 03/04/2020   Procedure: SINUS ENDOSCOPY WITH FUSION NAVIGATION;  Surgeon: Jerrell Belfast, MD;  Location: [facility];  Service: ENT;  Laterality: Bilateral;   TONSILLECTOMY  70's   TRANSTHORACIC ECHOCARDIOGRAM  12/2007   mild conc LVH   TURBINATE REDUCTION Bilateral 03/04/2020   Procedure: TURBINATE REDUCTION;  Surgeon: Jerrell Belfast, MD;  Location: [place] [facility];  Service: ENT;  Laterality: Bilateral;    Review of systems negative except as noted in HPI / PMHx or noted below:  Review of Systems  Constitutional: Negative.   HENT: Negative.  Eyes: Negative.   Respiratory: Negative.    Cardiovascular: Negative.   Gastrointestinal: Negative.   Genitourinary: Negative.   Musculoskeletal: Negative.   Skin: Negative.   Neurological: Negative.   Endo/Heme/Allergies: Negative.   Psychiatric/Behavioral: Negative.      Objective:   Vitals:   05/24/21 0903  BP: 130/82  Pulse: 74  Resp: 16  Temp: (!) 97.3 F (36.3 C)  SpO2: 98%   Height: 5' 5.5" (166.4 cm)  Weight: 169 lb 6.4 oz (76.8 kg)   Physical Exam Constitutional:      Appearance: She is not diaphoretic.  HENT:     Head: Normocephalic.     Right Ear: Tympanic membrane, ear canal and external ear normal.     Left Ear: Tympanic membrane, ear canal and external ear normal.     Nose: Nose normal. No mucosal edema or rhinorrhea.     Mouth/Throat:     Pharynx: Uvula midline. No oropharyngeal exudate.  Eyes:     Conjunctiva/sclera: Conjunctivae normal.  Neck:     Thyroid: No thyromegaly.     Trachea: Trachea normal. No tracheal tenderness or tracheal deviation.  Cardiovascular:     Rate and  Rhythm: Normal rate and regular rhythm.     Heart sounds: Normal heart sounds, S1 normal and S2 normal. No murmur heard. Pulmonary:     Effort: No respiratory distress.     Breath sounds: Normal breath sounds. No stridor. No wheezing or rales.  Lymphadenopathy:     Head:     Right side of head: No tonsillar adenopathy.     Left side of head: No tonsillar adenopathy.     Cervical: No cervical adenopathy.  Skin:    Findings: No erythema or rash.     Nails: There is no clubbing.  Neurological:     Mental Status: She is alert.    Diagnostics:    Spirometry was performed and demonstrated an FEV1 of 1.17 at 68 % of predicted.  The patient had an Asthma Control Test with the following results: ACT Total Score: 21.    Assessment and Plan:   1. Not well controlled severe persistent asthma   2. Perennial allergic rhinitis   3. Chronic pansinusitis   4. Nasal polyposis   5. LPRD (laryngopharyngeal reflux disease)   6. Allergy with anaphylaxis due to food   7. Family history of lung cancer     1.  Allergen avoidance measures - dust mite, cat, dog, mammal consumption  2.  Continue to treat and prevent inflammation:  A. Breztri - 2 inhalations 2 times per day w/spacer (empty lungs) B. Flonase / Nasacort - 1-2 sprays each nostril 2 times per day C. Montelukast 10 mg - 1 tablet 1 time per day D. Benralizumab injections when available  3.  Continue to treat and prevent reflux/LPR:  A. Minimize caffeine / chocolate consumption B. Omeprazole 40 mg - 1 tablet in AM C. Famotidine 40 mg - 1 tablet in PM  4.  If needed:  A. Albuterol MDI or nebulization B. Nasal saline C. Antihistamine D. Epi-Pen  5.  Obtain Chest X-ray  6.  Return to clinic in 12 weeks or earlier if problem  Keonta has had improvement on her current plan and I suspect once we give her benralizumab most of her respiratory tract symptoms will abate and will be able to dramatically consolidate her  anti-inflammatory agents for her airway.  We will keep her on her current therapy for LPR.  Of  course she is going to remain away from mammal consumption given her alpha gal syndrome.  She does have a recent family history of small cell lung cancer and she would like to obtain a chest x-ray.  I will contact her with the results of the chest x-ray once it is available for review.  I will see her back in this clinic in 12 weeks or earlier if there is a problem.  Allena Katz, MD Allergy / Immunology [place]

## 2021-05-24 NOTE — Patient Instructions (Signed)
°  1.  Allergen avoidance measures - dust mite, cat, dog, mammal consumption  2.  Continue to treat and prevent inflammation:  A. Breztri - 2 inhalations 2 times per day w/spacer (empty lungs) B. Flonase / Nasacort - 1-2 sprays each nostril 2 times per day C. Montelukast 10 mg - 1 tablet 1 time per day D. Benralizumab injections when available  3.  Continue to treat and prevent reflux/LPR:  A. Minimize caffeine / chocolate consumption B. Omeprazole 40 mg - 1 tablet in AM C. Famotidine 40 mg - 1 tablet in PM  4.  If needed:  A. Albuterol MDI or nebulization B. Nasal saline C. Antihistamine D. Epi-Pen  5.  Obtain Chest X-ray  6.  Return to clinic in 12 weeks or earlier if problem

## 2021-05-25 ENCOUNTER — Encounter: Payer: Self-pay | Admitting: Allergy and Immunology

## 2021-05-31 DIAGNOSIS — Z03818 Encounter for observation for suspected exposure to other biological agents ruled out: Secondary | ICD-10-CM | POA: Diagnosis not present

## 2021-06-09 ENCOUNTER — Ambulatory Visit
Admission: RE | Admit: 2021-06-09 | Discharge: 2021-06-09 | Disposition: A | Discharge status: Home / Self Care | Payer: Medicare HMO | Source: Ambulatory Visit | Attending: Family Medicine | Admitting: Family Medicine

## 2021-06-09 ENCOUNTER — Ambulatory Visit
Admission: RE | Admit: 2021-06-09 | Discharge: 2021-06-09 | Disposition: A | Discharge status: Home / Self Care | Payer: Medicare HMO | Source: Ambulatory Visit | Attending: Allergy and Immunology | Admitting: Allergy and Immunology

## 2021-06-09 DIAGNOSIS — Z1231 Encounter for screening mammogram for malignant neoplasm of breast: Secondary | ICD-10-CM | POA: Diagnosis not present

## 2021-06-09 DIAGNOSIS — R0602 Shortness of breath: Secondary | ICD-10-CM | POA: Diagnosis not present

## 2021-06-10 ENCOUNTER — Other Ambulatory Visit: Payer: Self-pay | Admitting: Family Medicine

## 2021-06-10 DIAGNOSIS — E119 Type 2 diabetes mellitus without complications: Secondary | ICD-10-CM | POA: Diagnosis not present

## 2021-06-10 DIAGNOSIS — R928 Other abnormal and inconclusive findings on diagnostic imaging of breast: Secondary | ICD-10-CM

## 2021-06-10 DIAGNOSIS — I519 Heart disease, unspecified: Secondary | ICD-10-CM | POA: Diagnosis not present

## 2021-06-10 DIAGNOSIS — I10 Essential (primary) hypertension: Secondary | ICD-10-CM | POA: Diagnosis not present

## 2021-06-10 DIAGNOSIS — E785 Hyperlipidemia, unspecified: Secondary | ICD-10-CM | POA: Diagnosis not present

## 2021-06-14 ENCOUNTER — Ambulatory Visit: Payer: Medicare HMO

## 2021-06-15 ENCOUNTER — Ambulatory Visit: Payer: Medicare HMO | Admitting: Cardiovascular Disease

## 2021-06-15 ENCOUNTER — Ambulatory Visit (INDEPENDENT_AMBULATORY_CARE_PROVIDER_SITE_OTHER): Payer: Medicare HMO

## 2021-06-15 ENCOUNTER — Other Ambulatory Visit: Payer: Self-pay

## 2021-06-15 ENCOUNTER — Encounter: Payer: Self-pay | Admitting: Cardiovascular Disease

## 2021-06-15 DIAGNOSIS — I2510 Atherosclerotic heart disease of native coronary artery without angina pectoris: Secondary | ICD-10-CM

## 2021-06-15 DIAGNOSIS — R002 Palpitations: Secondary | ICD-10-CM

## 2021-06-15 DIAGNOSIS — I10 Essential (primary) hypertension: Secondary | ICD-10-CM | POA: Diagnosis not present

## 2021-06-15 DIAGNOSIS — E785 Hyperlipidemia, unspecified: Secondary | ICD-10-CM

## 2021-06-15 DIAGNOSIS — R0600 Dyspnea, unspecified: Secondary | ICD-10-CM | POA: Diagnosis not present

## 2021-06-15 DIAGNOSIS — R600 Localized edema: Secondary | ICD-10-CM

## 2021-06-15 NOTE — Assessment & Plan Note (Signed)
History of essential hypertension a blood pressure measured today at 124/64.  She is on carvedilol, and hydralazine.

## 2021-06-15 NOTE — Assessment & Plan Note (Signed)
History of CAD status post cardiac catheterization which I performed 01/15/2011 revealing noncritical CAD with a 60% mid LAD lesion.  She did have an anterior takeoff of her RCA.  She denies chest pain.

## 2021-06-15 NOTE — Progress Notes (Unsigned)
Enrolled patient for a 14 day Zio XT  monitor to be mailed to patients home  °

## 2021-06-15 NOTE — Assessment & Plan Note (Signed)
Patient complains of palpitations and episodes of "presyncope".  I am going to get a 2-week Zio patch to further evaluate

## 2021-06-15 NOTE — Assessment & Plan Note (Signed)
History of lower extremity edema on torsemide

## 2021-06-15 NOTE — Assessment & Plan Note (Signed)
History of dyslipidemia on statin therapy with lipid profile performed 01/25/2021 by her PCP revealing a total cholesterol of 133, LDL of 69 and HDL of 47.

## 2021-06-15 NOTE — Progress Notes (Signed)
06/15/2021 Megan Hull   [birth year]  [id number]  Primary Physician [name], DO Primary Cardiologist: Lorretta Harp MD FACP, [place], [place], [place]  HPI:  Megan Hull is a 81 y.o.  mildly overweight, single [name] with no children whom I last saw in the office 02/10/2020.Marland KitchenHer problems include hypertension, hyperlipidemia, continued tobacco abuse of one-third pack per day, and family history of premature heart disease. She had a negative stress test on December 24, 2007. She was admitted to the [facility] on January 15, 2011, with jaw pain, thought to be an anginal equivalent. I catheterized her, revealing noncritical CAD with a 60% mid LAD lesion. She had anterior takeoff RCA. She has been asymptomatic since. She had carotid Dopplers performed at Dr. Roland Earl office which apparently were unremarkable. A lipid profile performed in March revealed total cholesterol of 142, LDL of 76, and HDL of 47. Her major complaint is of lower extremity edema, which gets worse during the day. I had done venous reflux studies on her back in November which were normal.since I saw her last a year and a half ago she has noticed increasing lower extremity edema which is dependent as well as increasing dyspnea on exertion. She denies chest pain. Her weight is stable same. She does admit to dietary indiscretion with regard to salt. She empirically increased her diuretic therapy. This resulted in improvement in her edema. She had recently come back from spending 5 weeks in [place] when I saw her last in July of last year and was anticipating going to [place]. I did do a Myoview stress test on her because of chest pain 01/12/16 which was normal. She developed an angioedema in August of last year probably related to her ARB. She didn't require intubation for airway protection. She had no allergy testing and ARB allergy was confirmed. She was begun on amlodipine which resulted in increasing lower  extremity edema, fluid retention and dyspnea. Her amlodipine was discontinued and she was begun on hydralazine. She returns today with complaints of increasing dyspnea on exertion and lower extremity edema. Her PCP recently increased her diuretic which resulted in some improvement in her edema. Since I saw her back 6 months ago she continues to complain of dyspnea on exertion which is progressive.    She saw  Kerin Ransom in the office 05/30/2018 complaining some neck pain and shortness of breath.  A Myoview stress test was ordered which was completely normal and a 2D echo revealed normal LV systolic function with grade 1 diastolic dysfunction.      She was seen in the emergency room by Dr. Christy Gentles on 05/16/2019 with tachypalpitations which began at night while she was watching TV.  She she thought that she was in bigeminy or had irregular heart rhythm.  She tried a Valsalva maneuver which did not work.  When she arrived to the emergency room she was in sinus rhythm.  She does admit to using decongestants but no pseudoephedrine.  She denies chest pain or shortness of breath.   Since I saw her in the office a year and a half ago she has seen Coletta Memos, FNP as well in the interim.  She complains of some palpitations with episodes of presyncope.  She still complains of dyspnea most likely related to reactive airways disease and is followed by Dr. Neldon Mc.  She denies chest pain.     Current Meds  Medication Sig   Alum & Mag Hydroxide-Simeth (ANTACID ADVANCED  PO) Take 1 tablet by mouth daily as needed (indigestion).    aspirin EC 81 MG tablet Take 1 tablet (81 mg total) by mouth daily. Swallow whole.   Budeson-Glycopyrrol-Formoterol (BREZTRI AEROSPHERE) 160-9-4.8 MCG/ACT AERO Inhale 2 puffs into the lungs in the morning and at bedtime.   cetirizine (ZYRTEC) 10 MG tablet Take 1 tablet (10 mg total) by mouth 2 (two) times daily as needed for allergies (Can use an extra dose during flare ups.).    Cholecalciferol (VITAMIN D3 PO) Take 50 mg by mouth daily.   famotidine (PEPCID) 40 MG tablet Take 1 tablet (40 mg total) by mouth at bedtime.   fluticasone (FLONASE) 50 MCG/ACT nasal spray Place 1 spray into both nostrils daily.   Fluticasone Furoate 50 MCG/ACT AEPB Inhale into the lungs.   isosorbide mononitrate (IMDUR) 30 MG 24 hr tablet Take 1 tablet (30 mg total) by mouth daily.   montelukast (SINGULAIR) 10 MG tablet Take 10 mg by mouth at bedtime.   Multiple Vitamins-Minerals (AIRBORNE PO) Take 1 tablet by mouth daily.   Probiotic Product (PROBIOTIC DAILY PO) Take by mouth.   simvastatin (ZOCOR) 40 MG tablet TAKE 1 TABLET EVERY DAY   Spacer/Aero-Holding Chambers (AEROCHAMBER MV) inhaler Use as instructed   torsemide (DEMADEX) 20 MG tablet Take 1 tablet (20 mg total) by mouth in the morning AND 0.5 tablets (10 mg total) daily at 2 PM.     Allergies  Allergen Reactions   Nexium [Esomeprazole Magnesium] Anaphylaxis    Pt states she was unable to swallow for several hours after taking the nexium   Other Hives, Shortness Of Breath and Other (See Comments)    Red Fish (Hives) Bojangle's chicken (swelling and shortness of breath) PAPRIKA   Codeine Other (See Comments)    REACTION: chest pain   Ace Inhibitors Swelling    Angioedema    Latex Other (See Comments)    Causes skin to turn red per patient    Monosodium Glutamate Other (See Comments)    dizziness   Amlodipine Other (See Comments)    Lower extremity swelling, itching   Shellfish Allergy Hives   Tape Rash    Coban wrap turned her arm red (after stress test)    Social History   Socioeconomic History   Marital status: Single    Spouse name: Not on file   Number of children: 0   Years of education: 12+   Highest education level: Not on file  Occupational History   Occupation: [employer]  Tobacco Use   Smoking status: Former    Packs/day: 0.25    Years: 15.00    Pack years: 3.75    Types: Cigarettes    Quit date:  02/04/2016    Years since quitting: 5.3   Smokeless tobacco: Never  Vaping Use   Vaping Use: Never used  Substance and Sexual Activity   Alcohol use: Not Currently    Comment: occasional - tequila shots on holidays   Drug use: No   Sexual activity: Not on file  Other Topics Concern   Not on file  Social History Narrative   Lives alone    Caffeine use: 2 cups coffee per day (mixed decaf/regular)   Social Determinants of Health   Financial Resource Strain: Not on file  Food Insecurity: Not on file  Transportation Needs: Not on file  Physical Activity: Not on file  Stress: Not on file  Social Connections: Not on file  Intimate Partner Violence: Not on file  Review of Systems: General: negative for chills, fever, night sweats or weight changes.  Cardiovascular: negative for chest pain, dyspnea on exertion, edema, orthopnea, palpitations, paroxysmal nocturnal dyspnea or shortness of breath Dermatological: negative for rash Respiratory: negative for cough or wheezing Urologic: negative for hematuria Abdominal: negative for nausea, vomiting, diarrhea, bright red blood per rectum, melena, or hematemesis Neurologic: negative for visual changes, syncope, or dizziness All other systems reviewed and are otherwise negative except as noted above.    Blood pressure 124/64, pulse 78, height 5\' 5"  (1.651 m), weight 169 lb 3.2 oz (76.7 kg), SpO2 98 %.  General appearance: alert and no distress Neck: no adenopathy, no carotid bruit, no JVD, supple, symmetrical, trachea midline, and thyroid not enlarged, symmetric, no tenderness/mass/nodules Lungs: clear to auscultation bilaterally Heart: regular rate and rhythm, S1, S2 normal, no murmur, click, rub or gallop Extremities: extremities normal, atraumatic, no cyanosis or edema Pulses: 2+ and symmetric Skin: Skin color, texture, turgor normal. No rashes or lesions Neurologic: Grossly normal  EKG sinus rhythm at 78 with frequent PVCs.  I  personally reviewed this EKG.  ASSESSMENT AND PLAN:   Essential hypertension History of essential hypertension a blood pressure measured today at 124/64.  She is on carvedilol, and hydralazine.  Dyslipidemia History of dyslipidemia on statin therapy with lipid profile performed 01/25/2021 by her PCP revealing a total cholesterol of 133, LDL of 69 and HDL of 47.  Dyspnea History of dyspnea possibly related to her reactive airways disease.  She does have normal LV function with grade 1 diastolic dysfunction.  Lower extremity edema History of lower extremity edema on torsemide  Coronary artery disease History of CAD status post cardiac catheterization which I performed 01/15/2011 revealing noncritical CAD with a 60% mid LAD lesion.  She did have an anterior takeoff of her RCA.  She denies chest pain.  Palpitations Patient complains of palpitations and episodes of "presyncope".  I am going to get a 2-week Zio patch to further evaluate     Lorretta Harp MD [facility], [facility] 06/15/2021 10:15 AM

## 2021-06-15 NOTE — Assessment & Plan Note (Signed)
History of dyspnea possibly related to her reactive airways disease.  She does have normal LV function with grade 1 diastolic dysfunction.

## 2021-06-15 NOTE — Patient Instructions (Signed)
Medication Instructions:  Your physician recommends that you continue on your current medications as directed. Please refer to the Current Medication list given to you today.  *If you need a refill on your cardiac medications before your next appointment, please call your pharmacy*   Testing/Procedures: Your physician has requested that you have an echocardiogram. Echocardiography is a painless test that uses sound waves to create images of your heart. It provides your doctor with information about the size and shape of your heart and how well your hearts chambers and valves are working. This procedure takes approximately one hour. There are no restrictions for this procedure. This procedure is done at [address] Monitor Instructions  Your physician has requested you wear a ZIO patch monitor for 14 days.  This is a single patch monitor. [employer] supplies one patch monitor per enrollment. Additional stickers are not available. Please do not apply patch if you will be having a Nuclear Stress Test,  Echocardiogram, Cardiac CT, MRI, or Chest Xray during the period you would be wearing the  monitor. The patch cannot be worn during these tests. You cannot remove and re-apply the  ZIO XT patch monitor.  Your ZIO patch monitor will be mailed 3 day USPS to your address on file. It may take 3-5 days  to receive your monitor after you have been enrolled.  Once you have received your monitor, please review the enclosed instructions. Your monitor  has already been registered assigning a specific monitor serial # to you.  Billing and Patient Assistance Program Information  We have supplied [employer] with any of your insurance information on file for billing purposes. [employer] offers a sliding scale Patient Assistance Program for patients that do not have  insurance, or whose insurance does not completely cover the cost of the ZIO monitor.  You must apply for the Patient  Assistance Program to qualify for this discounted rate.  To apply, please call [employer] at [phone], select option 4, select option 2, ask to apply for  Patient Assistance Program. Theodore Demark will ask your household income, and how many people  are in your household. They will quote your out-of-pocket cost based on that information.  [employer] will also be able to set up a 47-month, interest-free payment plan if needed.  Applying the monitor   Shave hair from upper left chest.  Hold abrader disc by orange tab. Rub abrader in 40 strokes over the upper left chest as  indicated in your monitor instructions.  Clean area with 4 enclosed alcohol pads. Let dry.  Apply patch as indicated in monitor instructions. Patch will be placed under collarbone on left  side of chest with arrow pointing upward.  Rub patch adhesive wings for 2 minutes. Remove white label marked "1". Remove the white  label marked "2". Rub patch adhesive wings for 2 additional minutes.  While looking in a mirror, press and release button in center of patch. A small green light will  flash 3-4 times. This will be your only indicator that the monitor has been turned on.  Do not shower for the first 24 hours. You may shower after the first 24 hours.  Press the button if you feel a symptom. You will hear a small click. Record Date, Time and  Symptom in the Patient Logbook.  When you are ready to remove the patch, follow instructions on the last 2 pages of Patient  Logbook. Stick patch monitor onto the last  page of Patient Logbook.  Place Patient Logbook in the blue and white box. Use locking tab on box and tape box closed  securely. The blue and white box has prepaid postage on it. Please place it in the mailbox as  soon as possible. Your physician should have your test results approximately 7 days after the  monitor has been mailed back to [facility].  Call [facility] at [phone] if you have questions  regarding  your ZIO XT patch monitor. Call them immediately if you see an orange light blinking on your  monitor.  If your monitor falls off in less than 4 days, contact our Monitor department at [phone].  If your monitor becomes loose or falls off after 4 days call [employer] at [phone] for  suggestions on securing your monitor   Follow-Up: At [facility], you and your health needs are our priority.  As part of our continuing mission to provide you with exceptional heart care, we have created designated Provider Care Teams.  These Care Teams include your primary Cardiologist (physician) and Advanced Practice Providers (APPs -  Physician Assistants and Nurse Practitioners) who all work together to provide you with the care you need, when you need it.  We recommend signing up for the patient portal called "MyChart".  Sign up information is provided on this After Visit Summary.  MyChart is used to connect with patients for Virtual Visits (Telemedicine).  Patients are able to view lab/test results, encounter notes, upcoming appointments, etc.  Non-urgent messages can be sent to your provider as well.   To learn more about what you can do with MyChart, go to [email or web address].    Your next appointment:   6 month(s)  The format for your next appointment:   In Person  Provider:   Coletta Memos, FNP       Then, Quay Burow, MD will plan to see you again in 12 month(s).

## 2021-06-16 ENCOUNTER — Ambulatory Visit (INDEPENDENT_AMBULATORY_CARE_PROVIDER_SITE_OTHER): Payer: Medicare HMO

## 2021-06-16 DIAGNOSIS — J4550 Severe persistent asthma, uncomplicated: Secondary | ICD-10-CM

## 2021-06-16 MED ORDER — BENRALIZUMAB 30 MG/ML ~~LOC~~ SOSY
30.0000 mg | PREFILLED_SYRINGE | SUBCUTANEOUS | Status: AC
  Administered 2021-06-16 – 2021-08-11 (×3): 30 mg via SUBCUTANEOUS

## 2021-06-16 NOTE — Addendum Note (Signed)
Addended by: Orma Render on: 06/16/2021 04:56 PM   Modules accepted: Orders

## 2021-06-16 NOTE — Progress Notes (Signed)
Immunotherapy   Patient Details  Name: Megan Hull MRN: [id number] Date of Birth: August 06, 1940  06/16/2021  Elsworth Soho started injections for Asthma. Patient received Berna Bue in her right arm and waited 30 minutes with no problems.   Frequency: every 4 weeks times 3 doses then every 8 weeks. Consent signed and patient instructions given.   Herbie Drape 06/16/2021, 4:19 PM

## 2021-06-19 DIAGNOSIS — R002 Palpitations: Secondary | ICD-10-CM | POA: Diagnosis not present

## 2021-06-21 ENCOUNTER — Telehealth: Payer: Self-pay | Admitting: Cardiovascular Disease

## 2021-06-21 NOTE — Telephone Encounter (Signed)
Left message for patient to return the call.

## 2021-06-21 NOTE — Telephone Encounter (Signed)
STAT if HR is under 50 or over 120 (normal HR is 60-100 beats per minute)  What is your heart rate? 40s this morning right now its in the low 60s  Do you have a log of your heart rate readings (document readings)?    Do you have any other symptoms? Lightheadedness (comes and go)

## 2021-06-23 NOTE — Telephone Encounter (Signed)
° °  [facility] calling in behalf of pt, she said pt continually have low HR ranging 40-42 bpm, she asked if the nurse can reach out again with the pt, she said pt tried calling back but doesn't want to wait on the phone, she asked if someone can cb pt today

## 2021-06-23 NOTE — Telephone Encounter (Signed)
Spoke to patient she stated she is wearing heart monitor.She is still having slow heart beat and she gets dizzy, lightheaded at times.Advised to continue to wear monitor.Dr.Berry's RN will call with results once Dr.Berry has reviewed final report.

## 2021-06-24 ENCOUNTER — Other Ambulatory Visit: Payer: Self-pay | Admitting: General Practice

## 2021-06-30 ENCOUNTER — Telehealth: Payer: Self-pay | Admitting: *Deleted

## 2021-06-30 NOTE — Telephone Encounter (Signed)
I called and spoke with the patient and advised of Prednisone and Mucinex DM, she states that she will get the Mucinex but wants to avoid the Prednisone because last time it made her blood sugar very high and she currently has an elevated blood glucose level and A1C that she is trying to bring down with proper diet and is avoiding trying to be put on diabetic medication. She states that she will use the Mucinex and her other medications.

## 2021-06-30 NOTE — Telephone Encounter (Signed)
Patient called and stated that she has been having a persistent cough for the last day and a half. She denies any fever, body aches, or chills. She tested herself for COVID and it is negative. She states when she is able to cough up mucous it is white. She confirmed she has been using her Breztri, I did advise she can add on her albuterol inhaler or nebulizer every 4-6 hours. She states that her PCP gave her tessalon pearles and they did help her sleep last night and subside her cough. She states she stopped the Flonase because it is causing her nosebleeds and she is going to add on Nasacort. She wanted to know if you had any further recommendations.

## 2021-07-01 ENCOUNTER — Other Ambulatory Visit (HOSPITAL_COMMUNITY): Payer: Medicare HMO

## 2021-07-01 ENCOUNTER — Other Ambulatory Visit: Payer: Self-pay

## 2021-07-01 ENCOUNTER — Ambulatory Visit (HOSPITAL_COMMUNITY): Payer: Medicare HMO | Attending: Cardiovascular Disease

## 2021-07-01 DIAGNOSIS — I2510 Atherosclerotic heart disease of native coronary artery without angina pectoris: Secondary | ICD-10-CM | POA: Diagnosis not present

## 2021-07-01 DIAGNOSIS — E119 Type 2 diabetes mellitus without complications: Secondary | ICD-10-CM | POA: Diagnosis not present

## 2021-07-01 DIAGNOSIS — H04123 Dry eye syndrome of bilateral lacrimal glands: Secondary | ICD-10-CM | POA: Diagnosis not present

## 2021-07-01 DIAGNOSIS — R002 Palpitations: Secondary | ICD-10-CM

## 2021-07-01 DIAGNOSIS — R0600 Dyspnea, unspecified: Secondary | ICD-10-CM | POA: Insufficient documentation

## 2021-07-01 DIAGNOSIS — R600 Localized edema: Secondary | ICD-10-CM | POA: Diagnosis not present

## 2021-07-01 DIAGNOSIS — I10 Essential (primary) hypertension: Secondary | ICD-10-CM | POA: Diagnosis not present

## 2021-07-01 DIAGNOSIS — Z961 Presence of intraocular lens: Secondary | ICD-10-CM | POA: Diagnosis not present

## 2021-07-01 DIAGNOSIS — H40013 Open angle with borderline findings, low risk, bilateral: Secondary | ICD-10-CM | POA: Diagnosis not present

## 2021-07-01 DIAGNOSIS — H1013 Acute atopic conjunctivitis, bilateral: Secondary | ICD-10-CM | POA: Diagnosis not present

## 2021-07-01 DIAGNOSIS — H0102A Squamous blepharitis right eye, upper and lower eyelids: Secondary | ICD-10-CM | POA: Diagnosis not present

## 2021-07-01 DIAGNOSIS — H0102B Squamous blepharitis left eye, upper and lower eyelids: Secondary | ICD-10-CM | POA: Diagnosis not present

## 2021-07-01 LAB — ECHOCARDIOGRAM COMPLETE
AR max vel: 0.98 cm2
AV Area VTI: 1.03 cm2
AV Area mean vel: 0.97 cm2
AV Mean grad: 13 mmHg
AV Peak grad: 23.7 mmHg
Ao pk vel: 2.44 m/s
Area-P 1/2: 2.64 cm2
S' Lateral: 2.95 cm

## 2021-07-08 DIAGNOSIS — R002 Palpitations: Secondary | ICD-10-CM | POA: Diagnosis not present

## 2021-07-12 DIAGNOSIS — E785 Hyperlipidemia, unspecified: Secondary | ICD-10-CM | POA: Diagnosis not present

## 2021-07-12 DIAGNOSIS — I519 Heart disease, unspecified: Secondary | ICD-10-CM | POA: Diagnosis not present

## 2021-07-12 DIAGNOSIS — E119 Type 2 diabetes mellitus without complications: Secondary | ICD-10-CM | POA: Diagnosis not present

## 2021-07-12 DIAGNOSIS — I10 Essential (primary) hypertension: Secondary | ICD-10-CM | POA: Diagnosis not present

## 2021-07-13 ENCOUNTER — Other Ambulatory Visit: Payer: Self-pay

## 2021-07-13 ENCOUNTER — Ambulatory Visit: Payer: Medicare HMO

## 2021-07-13 ENCOUNTER — Ambulatory Visit
Admission: RE | Admit: 2021-07-13 | Discharge: 2021-07-13 | Disposition: A | Discharge status: Home / Self Care | Payer: Medicare HMO | Source: Ambulatory Visit | Attending: Family Medicine | Admitting: Family Medicine

## 2021-07-13 DIAGNOSIS — R928 Other abnormal and inconclusive findings on diagnostic imaging of breast: Secondary | ICD-10-CM

## 2021-07-13 DIAGNOSIS — R922 Inconclusive mammogram: Secondary | ICD-10-CM | POA: Diagnosis not present

## 2021-07-14 ENCOUNTER — Ambulatory Visit (INDEPENDENT_AMBULATORY_CARE_PROVIDER_SITE_OTHER): Payer: Medicare HMO

## 2021-07-14 ENCOUNTER — Ambulatory Visit: Payer: Medicare HMO

## 2021-07-14 DIAGNOSIS — J4550 Severe persistent asthma, uncomplicated: Secondary | ICD-10-CM

## 2021-07-18 ENCOUNTER — Ambulatory Visit: Payer: Medicare HMO

## 2021-07-19 ENCOUNTER — Other Ambulatory Visit: Payer: Medicare HMO

## 2021-08-01 DIAGNOSIS — E119 Type 2 diabetes mellitus without complications: Secondary | ICD-10-CM | POA: Diagnosis not present

## 2021-08-01 DIAGNOSIS — E785 Hyperlipidemia, unspecified: Secondary | ICD-10-CM | POA: Diagnosis not present

## 2021-08-01 DIAGNOSIS — I10 Essential (primary) hypertension: Secondary | ICD-10-CM | POA: Diagnosis not present

## 2021-08-08 DIAGNOSIS — Z9109 Other allergy status, other than to drugs and biological substances: Secondary | ICD-10-CM | POA: Diagnosis not present

## 2021-08-08 DIAGNOSIS — J8283 Eosinophilic asthma: Secondary | ICD-10-CM | POA: Diagnosis not present

## 2021-08-08 DIAGNOSIS — E785 Hyperlipidemia, unspecified: Secondary | ICD-10-CM | POA: Diagnosis not present

## 2021-08-08 DIAGNOSIS — E876 Hypokalemia: Secondary | ICD-10-CM | POA: Diagnosis not present

## 2021-08-08 DIAGNOSIS — E1159 Type 2 diabetes mellitus with other circulatory complications: Secondary | ICD-10-CM | POA: Diagnosis not present

## 2021-08-08 DIAGNOSIS — Z0000 Encounter for general adult medical examination without abnormal findings: Secondary | ICD-10-CM | POA: Diagnosis not present

## 2021-08-08 DIAGNOSIS — I519 Heart disease, unspecified: Secondary | ICD-10-CM | POA: Diagnosis not present

## 2021-08-08 DIAGNOSIS — I10 Essential (primary) hypertension: Secondary | ICD-10-CM | POA: Diagnosis not present

## 2021-08-08 DIAGNOSIS — R946 Abnormal results of thyroid function studies: Secondary | ICD-10-CM | POA: Diagnosis not present

## 2021-08-09 DIAGNOSIS — I519 Heart disease, unspecified: Secondary | ICD-10-CM | POA: Diagnosis not present

## 2021-08-09 DIAGNOSIS — I10 Essential (primary) hypertension: Secondary | ICD-10-CM | POA: Diagnosis not present

## 2021-08-09 DIAGNOSIS — E785 Hyperlipidemia, unspecified: Secondary | ICD-10-CM | POA: Diagnosis not present

## 2021-08-09 DIAGNOSIS — E119 Type 2 diabetes mellitus without complications: Secondary | ICD-10-CM | POA: Diagnosis not present

## 2021-08-11 ENCOUNTER — Ambulatory Visit (INDEPENDENT_AMBULATORY_CARE_PROVIDER_SITE_OTHER): Payer: Medicare HMO | Admitting: *Deleted

## 2021-08-11 ENCOUNTER — Other Ambulatory Visit: Payer: Self-pay

## 2021-08-11 DIAGNOSIS — J4550 Severe persistent asthma, uncomplicated: Secondary | ICD-10-CM | POA: Diagnosis not present

## 2021-08-12 ENCOUNTER — Other Ambulatory Visit: Payer: Self-pay | Admitting: *Deleted

## 2021-08-12 MED ORDER — CETIRIZINE HCL 10 MG PO TABS: 10.0000 mg | ORAL_TABLET | Freq: Two times a day (BID) | ORAL | 1 refills | Status: DC | PRN

## 2021-08-15 ENCOUNTER — Telehealth: Payer: Self-pay | Admitting: Cardiovascular Disease

## 2021-08-15 NOTE — Telephone Encounter (Signed)
Spoke to patient she stated she has been having sob and slow heart beat since yesterday.Pulse at present 42.Stated she did not take Carvedilol today.Stated she is sob when she walks from room to room.Weight stable.She always has swelling in left foot no worse.Appointment scheduled with Diona Browner PA 3/7 at 8:40 am. ?

## 2021-08-15 NOTE — Telephone Encounter (Signed)
Follow Up: ? ? ?Patient is also short of breath., ?

## 2021-08-15 NOTE — Progress Notes (Signed)
Office Visit    Patient Name: Megan Hull Date of Encounter: 08/16/2021  Primary Care Provider:  [name], DO Primary Cardiologist:  Quay Burow, MD  Chief Complaint    81 year old female with a history of CAD, chronic dyspnea, palpitations, PACs, PVCs, PSVT, bradycardia, aortic valve stenosis, presyncope, hypertension, hyperlipidemia, angioedema and tobacco use who presents for follow-up related to shortness of breath and bradycardia.  Past Medical History    Past Medical History:  Diagnosis Date   Arthritis    CHF (congestive heart failure) (HCC)    Diabetes mellitus without complication (HCC)    borderline   Diarrhea    Diverticulosis    Dysrhythmia    controlled with Metoprolol-12-19-13 LOV -Dr. Gwenlyn Found sees yearly   Family history of premature CAD    GERD (gastroesophageal reflux disease)    Heart murmur    yrs ago   HLD (hyperlipidemia)    pt reports cholesterol normal now (07/21/19).   Hypertension    Lower extremity edema    Status post dilation of esophageal narrowing    Supraventricular arrhythmia    Tobacco abuse    Past Surgical History:  Procedure Laterality Date   ANTERIOR CERVICAL DECOMP/DISCECTOMY FUSION N/A 06/08/2015   Procedure: CERVICAL FIVE-SIX ,CERVICAL SIX-SEVEN ANTERIOR CERVICAL DECOMPRESSION/DISCECTOMY FUSION PLATING BONEGRAFT;  Surgeon: Kristeen Miss, MD;  Location: [facility];  Service: Neurosurgery;  Laterality: N/A;   CARDIAC CATHETERIZATION  01/15/2009   non-critical CAD, 60% mid LAD lesion (Dr. Adora Fridge)   CATARACT EXTRACTION, BILATERAL     most recent 11/19   COLONOSCOPY     ESOPHAGEAL DILATION  2014   infected lymph node surgery  80's   NM MYOCAR PERF WALL MOTION  04/2011   bruce myoview; normal pattern of perfusion, EF 80%, low risk scan   SINUS ENDO WITH FUSION Bilateral 03/04/2020   Procedure: SINUS ENDOSCOPY WITH FUSION NAVIGATION;  Surgeon: Jerrell Belfast, MD;  Location: [facility];  Service: ENT;   Laterality: Bilateral;   TONSILLECTOMY  70's   TRANSTHORACIC ECHOCARDIOGRAM  12/2007   mild conc LVH   TURBINATE REDUCTION Bilateral 03/04/2020   Procedure: TURBINATE REDUCTION;  Surgeon: Jerrell Belfast, MD;  Location: [facility];  Service: ENT;  Laterality: Bilateral;    Allergies  Allergies  Allergen Reactions   Nexium [Esomeprazole Magnesium] Anaphylaxis    Pt states she was unable to swallow for several hours after taking the nexium   Other Hives, Shortness Of Breath and Other (See Comments)    Red Fish (Hives) Bojangle's chicken (swelling and shortness of breath) PAPRIKA   Codeine Other (See Comments)    REACTION: chest pain   Ace Inhibitors Swelling    Angioedema    Latex Other (See Comments)    Causes skin to turn red per patient    Monosodium Glutamate Other (See Comments)    dizziness   Amlodipine Other (See Comments)    Lower extremity swelling, itching   Shellfish Allergy Hives   Tape Rash    Coban wrap turned her arm red (after stress test)    History of Present Illness    81 year old female with the above past medical history including CAD, chronic dyspnea, palpitations,  PACs, PVCs, PSVT, bradycardia, aortic valve stenosis, presyncope, hypertension, hyperlipidemia, angioedema and tobacco use.  She had a negative stress test in July 2009. She was hospitalized in August 2012. Cardiac catheterization at that time showed non-critical CAD with 60% mid LAD lesion.  Carotid Dopplers  in 2018 showed 1 to 39% bilateral ICA stenosis. Outpatient monitor in January 2023 showed sinus rhythm, sinus bradycardia, sinus tachycardia, runs of SVT, frequent PVCs, 20% burden. Additionally, she has chronic bilateral lower extremity edema. Echocardiogram in January 2023 showed EF 60 to 65%, mild asymmetric LVH of the septal and basal segments, right calcification of aortic valve, mild to moderate aortic valve stenosis. Repeat echocardiogram was recommended in 12 months.    She called our office on 08/15/2021 with complaints of low heart rate, acute shortness of breath.  She presents today for follow-up.  Since she called our office she reports isolated episode of acute shortness of breath on 08/15/2021 that started when she was outside working in her garden. She had a brief tightness in her chest, she went inside to rest heart.  Her chest tightness resolved but her heart rate was in the 40s based on her home BP cuff reading.  Breath for approximately 3 hours. Her BP was stable.  She denies weight gain, edema, PND, orthopnea, dizziness, presyncope, syncope.  She denies symptoms concerning for angina.  She is concerned that she has had varied HR readings at home she tends to feel bad when her home monitor shows a low heart rate.  Additionally, she is requesting to change her torsemide back to Lasix due to urinary frequency and urgency. Other than her low heart rate readings and episode of acute dyspnea yesterday, she denies any additional concerns or complaints today.  Home Medications    Current Outpatient Medications  Medication Sig Dispense Refill   albuterol (VENTOLIN HFA) 108 (90 Base) MCG/ACT inhaler Inhale 2 puffs into the lungs every 4 (four) hours as needed for wheezing or shortness of breath. 18 g 0   Alum & Mag Hydroxide-Simeth (ANTACID ADVANCED PO) Take 1 tablet by mouth daily as needed (indigestion).      aspirin EC 81 MG tablet Take 1 tablet (81 mg total) by mouth daily. Swallow whole. 90 tablet 3   Budeson-Glycopyrrol-Formoterol (BREZTRI AEROSPHERE) 160-9-4.8 MCG/ACT AERO Inhale 2 puffs into the lungs in the morning and at bedtime. 32.1 g 1   budesonide (PULMICORT) 0.25 MG/2ML nebulizer solution Take by nebulization 2 (two) times daily.     carvedilol (COREG) 12.5 MG tablet Take 1 tablet (12.5 mg total) by mouth every morning AND 0.5 tablets (6.25 mg total) every evening. 45 tablet 3   cetirizine (ZYRTEC) 10 MG tablet Take 1 tablet (10 mg total) by mouth 2  (two) times daily as needed for allergies (Can use an extra dose during flare ups.). 180 tablet 1   Cholecalciferol (VITAMIN D3 PO) Take 50 mg by mouth daily.     diphenhydrAMINE (BENADRYL) 25 MG tablet Take 2 tablets by mouth as needed for anaphylaxis.     EPINEPHrine 0.3 mg/0.3 mL IJ SOAJ injection Inject 0.3 mLs (0.3 mg total) into the muscle once as needed (anaphylaxis). 2 Device 0   famotidine (PEPCID) 40 MG tablet Take 1 tablet (40 mg total) by mouth at bedtime. 90 tablet 1   fluticasone (FLONASE) 50 MCG/ACT nasal spray Place 1 spray into both nostrils daily. 48 g 1   Fluticasone Furoate 50 MCG/ACT AEPB Inhale into the lungs.     furosemide (LASIX) 40 MG tablet Take 1 tablet (40 mg total) by mouth daily. 30 tablet 3   hydrALAZINE (APRESOLINE) 25 MG tablet TAKE 1 TABLET THREE TIMES DAILY AS NEEDED FOR SYSTOLIC BLOOD PRESSURE GREATER THAN 150 (NEED MD APPOINTMENT) (Patient taking differently: 3 (three) times  daily. TAKE 1 TABLET THREE TIMES DAILY (hold if systolic BP is less than 536) 90 tablet 11   ipratropium-albuterol (DUONEB) 0.5-2.5 (3) MG/3ML SOLN      isosorbide mononitrate (IMDUR) 30 MG 24 hr tablet Take 1 tablet (30 mg total) by mouth daily. 90 tablet 3   montelukast (SINGULAIR) 10 MG tablet Take 10 mg by mouth at bedtime.     Multiple Vitamins-Minerals (AIRBORNE PO) Take 1 tablet by mouth daily.     omeprazole (PRILOSEC) 40 MG capsule Take 1 capsule (40 mg total) by mouth every morning. 90 capsule 1   Probiotic Product (PROBIOTIC DAILY PO) Take by mouth.     simvastatin (ZOCOR) 40 MG tablet TAKE 1 TABLET EVERY DAY 90 tablet 3   Spacer/Aero-Holding Chambers (AEROCHAMBER MV) inhaler Use as instructed 1 each 2   torsemide (DEMADEX) 20 MG tablet TAKE ONE TABLET BY MOUTH IN THE MORNING AND HALF TABLET AT 2PM 90 tablet 0   No current facility-administered medications for this visit.     Review of Systems    She denies chest pain, pnd, orthopnea, n, v, syncope, edema, weight gain, or  early satiety. All other systems reviewed and are otherwise negative except as noted above.   Physical Exam    VS:  BP (!) 154/80    Pulse 81    Ht '5\' 6"'$  (1.676 m)    Wt 168 lb 12.8 oz (76.6 kg)    SpO2 97%    BMI 27.25 kg/m  GEN: Well nourished, well developed, in no acute distress. HEENT: normal. Neck: Supple, no JVD, carotid bruits, or masses. Cardiac: RRR, no murmurs, rubs, or gallops. No clubbing, cyanosis, edema.  Radials/DP/PT 2+ and equal bilaterally.  Respiratory:  Respirations regular and unlabored, clear to auscultation bilaterally. GI: Soft, nontender, nondistended, BS + x 4. MS: no deformity or atrophy. Skin: warm and dry, no rash. Neuro:  Strength and sensation are intact. Psych: Normal affect.  Accessory Clinical Findings    ECG personally reviewed by me today -sinus rhythm with frequent PVCs, 81 bpm- no acute changes.  Lab Results  Component Value Date   WBC 3.9 (L) 06/28/2019   HGB 12.8 06/28/2019   HCT 40.0 06/28/2019   MCV 94.8 06/28/2019   PLT 209 06/28/2019   Lab Results  Component Value Date   CREATININE 0.96 11/23/2020   BUN 15 11/23/2020   NA 140 11/23/2020   K 3.9 11/23/2020   CL 97 11/23/2020   CO2 28 11/23/2020   Lab Results  Component Value Date   ALT 12 06/28/2019   AST 29 06/28/2019   ALKPHOS 50 06/28/2019   BILITOT 0.8 06/28/2019   Lab Results  Component Value Date   CHOL 129 01/04/2016   HDL 51 01/04/2016   LDLCALC 57 01/04/2016   TRIG 104 01/04/2016   CHOLHDL 2.5 01/04/2016    Lab Results  Component Value Date   HGBA1C (H) 01/14/2009    6.3 (NOTE) The ADA recommends the following therapeutic goal for glycemic control related to Hgb A1c measurement: Goal of therapy: <6.5 Hgb A1c  Reference: American Diabetes Association: Clinical Practice Recommendations 2010, Diabetes Care, 2010, 33: (Suppl  1).    Assessment & Plan   1. Palpitations/symptomatic bradycardia/PVCs/ PACs/PSVT: Outpatient monitor in January 2023 showed sinus  rhythm, sinus bradycardia, sinus tachycardia, runs of SVT, frequent PVCs, 20% burden.  She reports low HR readings on her home BP cuff monitor with associated shortness of breath, chest tightness.  She held her  carvedilol given concern for low heart rate.  EKG today shows sinus rhythm with frequent PVCs, 81 bpm. Question if heart HR falsely low on home BP cuff in the setting of high PVC burden.  She does report lightheadedness when she has low HR readings at home.  Case discussed with primary cardiologist, Dr. Alvester Chou, who recommends EP referral. Will place referral to EP for symptomatic PVCs.  Continue carvedilol.   2. Chronic dyspnea: She has stable chronic dyspnea in the setting of reactive airway disease. Echo in Janurary 2023 showed EF 60 to 65%, mild asymmetric LVH of the septal and basal segments, right calcification of aortic valve, mild to moderate aortic valve stenosis. Repeat echocardiogram was recommended in 12 months. She did have an isolated episode of acute dyspnea at rest on 08/15/2021 likely in the setting of PVCs versus symptomatic bradycardia. Euvolemic and well compensated on exam. She does take torsemide 20 mg daily however she states that this is causing excessive urinary frequency and urgency.  She request to transition back to furosemide.  We will stop torsemide and start furosemide 40 mg daily.  Will check BMET today.  3. CAD: Cath in 2012 showed non-critical CAD with 60% mid LAD lesion.  She did have an episode of chest tightness and shortness of breath likely in the setting of PVCs/bradycardia.  No symptoms concerning for angina, denies exertional symptoms.  No indication for ischemic evaluation at this time.  Continue aspirin, carvedilol, hydralazine, isosorbide, furosemide as above, and simvastatin.   4. Mild to moderate aortic valve stenosis: Echo in January 2023 showed mild to moderate aortic valve stenosis.  She does have mild dizziness in the setting of low heart rate, PVCs.  She  denies presyncope, syncope.  Euvolemic as above.  Continue cu0rrent medications as above.  Plan for repeat echocardiogram in January 2024.   5. Carotid artery stenosis: Carotid Dopplers in 2018 showed 1 to 39% B ICA stenosis.  No bruit on exam.  Could consider repeat Dopplers if dizziness persist despite better control of PVCs/bradycardia as above.  6. Hypertension: BP is somewhat elevated in office today, though she has not taken her medication this morning.  Overall, home BP is well controlled. Recommend she take her hydralazine hydralazine 25 mg 3 times daily, hold for SBP <120.  Otherwise, continue current medications as above.  7. Hyperlipidemia: LDL was 68 in February 2022.  Repeat lipid profile at next visit, continue aspirin, simvastatin.  8. Tobacco use: Full cessation advised.  9. Disposition: EP consult, follow-up with general cardiology in 3 months.  Lenna Sciara, NP 08/16/2021, 9:47 AM

## 2021-08-15 NOTE — Telephone Encounter (Signed)
STAT if HR is under 50 or over 120 ?(normal HR is 60-100 beats per minute) ? ?What is your heart rate? 41 ? ?Do you have a log of your heart rate readings (document readings)?  ? ?Do you have any other symptoms? Blood 111/62 128/62 ? ?

## 2021-08-16 ENCOUNTER — Other Ambulatory Visit: Payer: Self-pay

## 2021-08-16 ENCOUNTER — Ambulatory Visit (INDEPENDENT_AMBULATORY_CARE_PROVIDER_SITE_OTHER): Payer: Medicare HMO | Admitting: Nurse Practitioner

## 2021-08-16 ENCOUNTER — Other Ambulatory Visit: Payer: Self-pay | Admitting: *Deleted

## 2021-08-16 ENCOUNTER — Encounter: Payer: Self-pay | Admitting: Nurse Practitioner

## 2021-08-16 VITALS — BP 154/80 | HR 81 | Ht 66.0 in | Wt 168.8 lb

## 2021-08-16 DIAGNOSIS — I6523 Occlusion and stenosis of bilateral carotid arteries: Secondary | ICD-10-CM | POA: Diagnosis not present

## 2021-08-16 DIAGNOSIS — I2510 Atherosclerotic heart disease of native coronary artery without angina pectoris: Secondary | ICD-10-CM

## 2021-08-16 DIAGNOSIS — I10 Essential (primary) hypertension: Secondary | ICD-10-CM

## 2021-08-16 DIAGNOSIS — I491 Atrial premature depolarization: Secondary | ICD-10-CM | POA: Diagnosis not present

## 2021-08-16 DIAGNOSIS — I471 Supraventricular tachycardia, unspecified: Secondary | ICD-10-CM

## 2021-08-16 DIAGNOSIS — R002 Palpitations: Secondary | ICD-10-CM | POA: Diagnosis not present

## 2021-08-16 DIAGNOSIS — R0600 Dyspnea, unspecified: Secondary | ICD-10-CM | POA: Diagnosis not present

## 2021-08-16 DIAGNOSIS — R001 Bradycardia, unspecified: Secondary | ICD-10-CM

## 2021-08-16 DIAGNOSIS — I350 Nonrheumatic aortic (valve) stenosis: Secondary | ICD-10-CM

## 2021-08-16 DIAGNOSIS — I493 Ventricular premature depolarization: Secondary | ICD-10-CM | POA: Diagnosis not present

## 2021-08-16 DIAGNOSIS — Z720 Tobacco use: Secondary | ICD-10-CM

## 2021-08-16 DIAGNOSIS — I5032 Chronic diastolic (congestive) heart failure: Secondary | ICD-10-CM | POA: Diagnosis not present

## 2021-08-16 DIAGNOSIS — E785 Hyperlipidemia, unspecified: Secondary | ICD-10-CM

## 2021-08-16 LAB — BASIC METABOLIC PANEL
BUN/Creatinine Ratio: 13 (ref 12–28)
BUN: 14 mg/dL (ref 8–27)
CO2: 27 mmol/L (ref 20–29)
Calcium: 8.9 mg/dL (ref 8.7–10.3)
Chloride: 102 mmol/L (ref 96–106)
Creatinine, Ser: 1.08 mg/dL — ABNORMAL HIGH (ref 0.57–1.00)
Glucose: 101 mg/dL — ABNORMAL HIGH (ref 70–99)
Potassium: 3.9 mmol/L (ref 3.5–5.2)
Sodium: 143 mmol/L (ref 134–144)
eGFR: 52 mL/min/{1.73_m2} — ABNORMAL LOW (ref >59)

## 2021-08-16 MED ORDER — FUROSEMIDE 40 MG PO TABS: 40.0000 mg | ORAL_TABLET | Freq: Every day | ORAL | 3 refills | Status: DC

## 2021-08-16 NOTE — Patient Instructions (Addendum)
Medication Instructions:  ?Start Lasix 40 mg daily ?Stop Torsemide  ?Hydralazine should be taken three times daily (hold if systolic (top number) is less than 120) ?Carvedilol take 1 tablet 12.5 mg in the morning and 0.5 mg in the evening. Hold when heart rate is less than 55 ? ?*If you need a refill on your cardiac medications before your next appointment, please call your pharmacy* ? ? ?Lab Work: ?Your physician recommends that you complete lab work today ?BMET ? ?If you have labs (blood work) drawn today and your tests are completely normal, you will receive your results only by: ?MyChart Message (if you have MyChart) OR ?A paper copy in the mail ?If you have any lab test that is abnormal or we need to change your treatment, we will call you to review the results. ? ? ?Testing/Procedures: ?NONE ordered at this time of appointment   ? ? ?Follow-Up: ?At [facility], you and your health needs are our priority.  As part of our continuing mission to provide you with exceptional heart care, we have created designated Provider Care Teams.  These Care Teams include your primary Cardiologist (physician) and Advanced Practice Providers (APPs -  Physician Assistants and Nurse Practitioners) who all work together to provide you with the care you need, when you need it. ? ?We recommend signing up for the patient portal called "MyChart".  Sign up information is provided on this After Visit Summary.  MyChart is used to connect with patients for Virtual Visits (Telemedicine).  Patients are able to view lab/test results, encounter notes, upcoming appointments, etc.  Non-urgent messages can be sent to your provider as well.   ?To learn more about what you can do with MyChart, go to [email or web address].   ? ?Your next appointment:   ?3 month(s) ? ?The format for your next appointment:   ?In Person ? ?Provider:   ?Quay Burow, MD  or Diona Browner, NP      ? ?Other ?Referral sent to Cardiac Electrophysiology  ? ? ? ? ? ?

## 2021-08-18 ENCOUNTER — Telehealth: Payer: Self-pay

## 2021-08-18 NOTE — Telephone Encounter (Signed)
Lmom, waiting on a return call to discuss lab results.  

## 2021-08-18 NOTE — Telephone Encounter (Signed)
-----   Message from Lenna Sciara, NP sent at 08/17/2021  3:10 PM EST ----- ?Recent labs show stable kidney functio and electrolytes. Continue current medications and follow-up as scheduled. Thank you.  ?

## 2021-08-18 NOTE — Telephone Encounter (Signed)
Pt returned called. Pt was notified of lab results.  ?

## 2021-08-31 ENCOUNTER — Observation Stay (HOSPITAL_BASED_OUTPATIENT_CLINIC_OR_DEPARTMENT_OTHER)
Admission: EM | Admit: 2021-08-31 | Discharge: 2021-09-02 | Disposition: A | Discharge status: Home / Self Care | Payer: Medicare HMO | Attending: Internal Medicine | Admitting: Internal Medicine

## 2021-08-31 ENCOUNTER — Other Ambulatory Visit: Payer: Self-pay

## 2021-08-31 ENCOUNTER — Encounter (HOSPITAL_BASED_OUTPATIENT_CLINIC_OR_DEPARTMENT_OTHER): Payer: Self-pay

## 2021-08-31 ENCOUNTER — Encounter: Payer: Self-pay | Admitting: Cardiology

## 2021-08-31 ENCOUNTER — Emergency Department (HOSPITAL_BASED_OUTPATIENT_CLINIC_OR_DEPARTMENT_OTHER): Payer: Medicare HMO

## 2021-08-31 ENCOUNTER — Ambulatory Visit (INDEPENDENT_AMBULATORY_CARE_PROVIDER_SITE_OTHER): Payer: Medicare HMO | Admitting: Cardiology

## 2021-08-31 VITALS — BP 108/64 | HR 67 | Ht 65.0 in | Wt 168.0 lb

## 2021-08-31 DIAGNOSIS — I499 Cardiac arrhythmia, unspecified: Secondary | ICD-10-CM | POA: Diagnosis present

## 2021-08-31 DIAGNOSIS — Z79899 Other long term (current) drug therapy: Secondary | ICD-10-CM | POA: Insufficient documentation

## 2021-08-31 DIAGNOSIS — Z7982 Long term (current) use of aspirin: Secondary | ICD-10-CM | POA: Diagnosis not present

## 2021-08-31 DIAGNOSIS — U071 COVID-19: Secondary | ICD-10-CM | POA: Insufficient documentation

## 2021-08-31 DIAGNOSIS — I10 Essential (primary) hypertension: Secondary | ICD-10-CM | POA: Diagnosis present

## 2021-08-31 DIAGNOSIS — N179 Acute kidney failure, unspecified: Secondary | ICD-10-CM | POA: Diagnosis not present

## 2021-08-31 DIAGNOSIS — E119 Type 2 diabetes mellitus without complications: Secondary | ICD-10-CM

## 2021-08-31 DIAGNOSIS — I110 Hypertensive heart disease with heart failure: Secondary | ICD-10-CM | POA: Insufficient documentation

## 2021-08-31 DIAGNOSIS — Z91040 Latex allergy status: Secondary | ICD-10-CM | POA: Insufficient documentation

## 2021-08-31 DIAGNOSIS — M5412 Radiculopathy, cervical region: Secondary | ICD-10-CM

## 2021-08-31 DIAGNOSIS — I509 Heart failure, unspecified: Secondary | ICD-10-CM | POA: Insufficient documentation

## 2021-08-31 DIAGNOSIS — Z87891 Personal history of nicotine dependence: Secondary | ICD-10-CM | POA: Diagnosis not present

## 2021-08-31 DIAGNOSIS — G459 Transient cerebral ischemic attack, unspecified: Principal | ICD-10-CM | POA: Diagnosis present

## 2021-08-31 DIAGNOSIS — I2510 Atherosclerotic heart disease of native coronary artery without angina pectoris: Secondary | ICD-10-CM | POA: Diagnosis not present

## 2021-08-31 DIAGNOSIS — E876 Hypokalemia: Secondary | ICD-10-CM | POA: Diagnosis present

## 2021-08-31 DIAGNOSIS — I493 Ventricular premature depolarization: Secondary | ICD-10-CM | POA: Diagnosis not present

## 2021-08-31 DIAGNOSIS — R002 Palpitations: Secondary | ICD-10-CM | POA: Diagnosis not present

## 2021-08-31 DIAGNOSIS — J45909 Unspecified asthma, uncomplicated: Secondary | ICD-10-CM | POA: Diagnosis not present

## 2021-08-31 DIAGNOSIS — M5481 Occipital neuralgia: Secondary | ICD-10-CM

## 2021-08-31 DIAGNOSIS — R519 Headache, unspecified: Secondary | ICD-10-CM | POA: Diagnosis not present

## 2021-08-31 DIAGNOSIS — E785 Hyperlipidemia, unspecified: Secondary | ICD-10-CM | POA: Diagnosis present

## 2021-08-31 LAB — CBC WITH DIFFERENTIAL/PLATELET
Abs Immature Granulocytes: 0 10*3/uL (ref 0.00–0.07)
Basophils Absolute: 0 10*3/uL (ref 0.0–0.1)
Basophils Relative: 0 %
Eosinophils Absolute: 0 10*3/uL (ref 0.0–0.5)
Eosinophils Relative: 0 %
HCT: 38.3 % (ref 36.0–46.0)
Hemoglobin: 12.6 g/dL (ref 12.0–15.0)
Immature Granulocytes: 0 %
Lymphocytes Relative: 41 %
Lymphs Abs: 2.3 10*3/uL (ref 0.7–4.0)
MCH: 29.4 pg (ref 26.0–34.0)
MCHC: 32.9 g/dL (ref 30.0–36.0)
MCV: 89.5 fL (ref 80.0–100.0)
Monocytes Absolute: 0.6 10*3/uL (ref 0.1–1.0)
Monocytes Relative: 11 %
Neutro Abs: 2.7 10*3/uL (ref 1.7–7.7)
Neutrophils Relative %: 48 %
Platelets: 226 10*3/uL (ref 150–400)
RBC: 4.28 MIL/uL (ref 3.87–5.11)
RDW: 13.6 % (ref 11.5–15.5)
WBC: 5.6 10*3/uL (ref 4.0–10.5)
nRBC: 0 % (ref 0.0–0.2)

## 2021-08-31 LAB — BASIC METABOLIC PANEL
Anion gap: 9 (ref 5–15)
BUN: 18 mg/dL (ref 8–23)
CO2: 25 mmol/L (ref 22–32)
Calcium: 9 mg/dL (ref 8.9–10.3)
Chloride: 103 mmol/L (ref 98–111)
Creatinine, Ser: 1.54 mg/dL — ABNORMAL HIGH (ref 0.44–1.00)
GFR, Estimated: 34 mL/min — ABNORMAL LOW (ref >60)
Glucose, Bld: 115 mg/dL — ABNORMAL HIGH (ref 70–99)
Potassium: 3.8 mmol/L (ref 3.5–5.1)
Sodium: 137 mmol/L (ref 135–145)

## 2021-08-31 MED ORDER — MEXILETINE HCL 250 MG PO CAPS: 250.0000 mg | ORAL_CAPSULE | Freq: Two times a day (BID) | ORAL | 11 refills | Status: DC

## 2021-08-31 NOTE — ED Triage Notes (Signed)
Patient here POV from Home. ? ?Approximately at 2100 the Patient was eating Dinner when she felt a Sudden Sharp in the Posterior Head/Neck.  ? ?Also endorsed Pins/Needles Sensation in Right Hand that has since subsided. ? ?BP of 173/91. Patient took 25 mg of Apresoline at Home. Currently, the Patient endorses Dull Sensation to Posterior Head/Neck. ? ?No N/V/D. No Fevers. No New SOB.  ? ?NAD Noted during Triage. A&Ox4. GCS 15. Ambulatory. ?

## 2021-08-31 NOTE — ED Notes (Signed)
Called [facility] to page hospitalist to Dr. Randal Buba ?

## 2021-08-31 NOTE — Patient Instructions (Addendum)
Medication Instructions:  ?Your physician has recommended you make the following change in your medication:  ? ? START mexiletine 250 mg-  Take one capsule by mouth twice a day ? ?Lab Work: ?None ordered. ?If you have labs (blood work) drawn today and your tests are completely normal, you will receive your results only by: ?MyChart Message (if you have MyChart) OR ?A paper copy in the mail ?If you have any lab test that is abnormal or we need to change your treatment, we will call you to review the results. ? ?Testing/Procedures: ?None ordered. ? ?Follow-Up: ?At [facility], you and your health needs are our priority.  As part of our continuing mission to provide you with exceptional heart care, we have created designated Provider Care Teams.  These Care Teams include your primary Cardiologist (physician) and Advanced Practice Providers (APPs -  Physician Assistants and Nurse Practitioners) who all work together to provide you with the care you need, when you need it. ? ?Your next appointment:   ?Your physician wants you to follow-up in: 3 MONTHS WITH Dr. Curt Bears ? ?Mexiletine capsules ?What is this medication? ?MEXILETINE (mex IL e teen) is an antiarrhythmic agent. This medicine is used to treat irregular heart rhythm and can slow rapid heartbeats. It can help your heart to return to and maintain a normal rhythm. Because of the side effects caused by this medicine, it is usually used for heartbeat problems that may be life-threatening. ?This medicine may be used for other purposes; ask your health care provider or pharmacist if you have questions. ?COMMON BRAND NAME(S): Mexitil ?What should I tell my care team before I take this medication? ?They need to know if you have any of these conditions: ?liver disease ?other heart problems ?previous heart attack ?an unusual or allergic reaction to mexiletine, other medicines, foods, dyes, or preservatives ?pregnant or trying to get pregnant ?breast-feeding ?How should I  use this medication? ?Take this medicine by mouth with a glass of water. Follow the directions on the prescription label. It is recommended that you take this medicine with food or an antacid. Take your doses at regular intervals. Do not take your medicine more often than directed. Do not stop taking except on the advice of your doctor or health care professional. ?Talk to your pediatrician regarding the use of this medicine in children. Special care may be needed. ?Overdosage: If you think you have taken too much of this medicine contact a poison control center or emergency room at once. ?NOTE: This medicine is only for you. Do not share this medicine with others. ?What if I miss a dose? ?If you miss a dose, take it as soon as you can. If it is almost time for your next dose, take only that dose. Do not take double or extra doses. ?What may interact with this medication? ?Do not take this medicine with any of the following medications: ?dofetilide ?This medicine may also interact with the following medications: ?caffeine ?cimetidine ?medicines for depression, anxiety, or psychotic disturbances ?medicines to control heart rhythm ?phenobarbital ?phenytoin ?rifampin ?theophylline ?This list may not describe all possible interactions. Give your health care provider a list of all the medicines, herbs, non-prescription drugs, or dietary supplements you use. Also tell them if you smoke, drink alcohol, or use illegal drugs. Some items may interact with your medicine. ?What should I watch for while using this medication? ?Your condition will be monitored closely when you first begin therapy. Often, this drug is first started in  a hospital or other monitored health care setting. Once you are on maintenance therapy, visit your doctor or health care provider for regular checks on your progress. Because your condition and use of this medicine carry some risk, it is a good idea to carry an identification card, necklace or  bracelet with details of your condition, medications, and doctor or health care provider. ?You may get drowsy or dizzy. Do not drive, use machinery, or do anything that needs mental alertness until you know how this medicine affects you. Do not stand or sit up quickly, especially if you are an older patient. This reduces the risk of dizzy or fainting spells. Alcohol can make you more dizzy, increase flushing and rapid heartbeats. Avoid alcoholic drinks. ?This medicine may cause serious skin reactions. They can happen weeks to months after starting the medicine. Contact your health care provider right away if you notice fevers or flu-like symptoms with a rash. The rash may be red or purple and then turn into blisters or peeling of the skin. Or, you might notice a red rash with swelling of the face, lips or lymph nodes in your neck or under your arms. ?What side effects may I notice from receiving this medication? ?Side effects that you should report to your doctor or health care professional as soon as possible: ?allergic reactions like skin rash, itching or hives, swelling of the face, lips, or tongue ?breathing problems ?chest pain, continued irregular heartbeats ?rash, fever, and swollen lymph nodes ?redness, blistering, peeling or loosening of the skin, including inside the mouth ?seizures ?skin rash ?trembling, shaking ?unusual bleeding or bruising ?unusually weak or tired ?Side effects that usually do not require medical attention (report to your doctor or health care professional if they continue or are bothersome): ?blurred vision ?difficulty walking ?heartburn ?nausea, vomiting ?nervousness ?numbness, or tingling in the fingers or toes ?This list may not describe all possible side effects. Call your doctor for medical advice about side effects. You may report side effects to FDA at 1-800-FDA-1088. ?Where should I keep my medication? ?Keep out of reach of children. ?Store at room temperature between 15 and 30  degrees C (59 and 86 degrees F). Throw away any unused medicine after the expiration date. ?NOTE: This sheet is a summary. It may not cover all possible information. If you have questions about this medicine, talk to your doctor, pharmacist, or health care provider. ?? 2022 Elsevier/Gold Standard (2018-09-05 00:00:00) ?  ? ?

## 2021-08-31 NOTE — Progress Notes (Signed)
? ?Electrophysiology Office Note ? ? ?Date:  08/31/2021  ? ?ID:  Megan Hull, DOB Jan 02, 1941, MRN [id number] ? ?PCP:  Megan Morning, DO  ?Cardiologist: Megan Hull ?Primary Electrophysiologist:  Megan Soulier Meredith Leeds, MD   ? ?Chief Complaint: PVCs ?  ?History of Present Illness: ?Megan Hull is a 81 y.o. female who is being seen today for the evaluation of PVCs at the request of Monge, Helane Gunther, NP. Presenting today for electrophysiology evaluation. ? ?She has a history significant for diabetes, hypertension, hyperlipidemia, coronary artery disease with a 60% LAD lesion, and PVCs.  She has been having consistent palpitations over the last few years.  She wore a cardiac monitor that showed a 20% PVC burden.  She states that she feels intermittently fatigued and short of breath with the palpitations.  She also notes that her heart rate has been slow at times.  She has checked her heart rate which has been in the 40s.  When she is not having PVCs she feels well and is able to do all of her daily activities.  She is generally quite active.  She is switched to a vegetarian diet and has had improvement in her diabetes control. ? ?Today, she denies symptoms of palpitations, chest pain, shortness of breath, orthopnea, PND, lower extremity edema, claudication, dizziness, presyncope, syncope, bleeding, or neurologic sequela. The patient is tolerating medications without difficulties.  ? ? ?Past Medical History:  ?Diagnosis Date  ? Arthritis   ? CHF (congestive heart failure) ([place])   ? Diabetes mellitus without complication ([place])   ? borderline  ? Diarrhea   ? Diverticulosis   ? Dysrhythmia   ? controlled with Metoprolol-12-19-13 LOV -Dr. Gwenlyn Hull sees yearly  ? Family history of premature CAD   ? GERD (gastroesophageal reflux disease)   ? Heart murmur   ? yrs ago  ? HLD (hyperlipidemia)   ? pt reports cholesterol normal now (07/21/19).  ? Hypertension   ? Lower extremity edema   ? Status post dilation of esophageal  narrowing   ? Supraventricular arrhythmia   ? Tobacco abuse   ? ?Past Surgical History:  ?Procedure Laterality Date  ? ANTERIOR CERVICAL DECOMP/DISCECTOMY FUSION N/A 06/08/2015  ? Procedure: CERVICAL FIVE-SIX ,CERVICAL SIX-SEVEN ANTERIOR CERVICAL DECOMPRESSION/DISCECTOMY FUSION PLATING BONEGRAFT;  Surgeon: Kristeen Miss, MD;  Location: [place] [facility];  Service: Neurosurgery;  Laterality: N/A;  ? CARDIAC CATHETERIZATION  01/15/2009  ? non-critical CAD, 60% mid LAD lesion (Dr. Adora Fridge)  ? CATARACT EXTRACTION, BILATERAL    ? most recent 11/19  ? COLONOSCOPY    ? ESOPHAGEAL DILATION  2014  ? infected lymph node surgery  80's  ? NM MYOCAR PERF WALL MOTION  04/2011  ? bruce myoview; normal pattern of perfusion, EF 80%, low risk scan  ? SINUS ENDO WITH FUSION Bilateral 03/04/2020  ? Procedure: SINUS ENDOSCOPY WITH FUSION NAVIGATION;  Surgeon: Jerrell Belfast, MD;  Location: [place];  Service: ENT;  Laterality: Bilateral;  ? TONSILLECTOMY  70's  ? TRANSTHORACIC ECHOCARDIOGRAM  12/2007  ? mild conc LVH  ? TURBINATE REDUCTION Bilateral 03/04/2020  ? Procedure: TURBINATE REDUCTION;  Surgeon: Jerrell Belfast, MD;  Location: [place];  Service: ENT;  Laterality: Bilateral;  ? ? ? ?Current Outpatient Medications  ?Medication Sig Dispense Refill  ? albuterol (VENTOLIN HFA) 108 (90 Base) MCG/ACT inhaler Inhale 2 puffs into the lungs every 4 (four) hours as needed for wheezing or shortness of breath. 18 g 0  ? Alum & Mag  Hydroxide-Simeth (ANTACID ADVANCED PO) Take 1 tablet by mouth daily as needed (indigestion).     ? aspirin EC 81 MG tablet Take 1 tablet (81 mg total) by mouth daily. Swallow whole. 90 tablet 3  ? Budeson-Glycopyrrol-Formoterol (BREZTRI AEROSPHERE) 160-9-4.8 MCG/ACT AERO Inhale 2 puffs into the lungs in the Hull and at bedtime. 32.1 g 1  ? budesonide (PULMICORT) 0.25 MG/2ML nebulizer solution Take by nebulization 2 (two) times daily.    ? carvedilol (COREG) 12.5 MG tablet Take 1  tablet (12.5 mg total) by mouth every Hull AND 0.5 tablets (6.25 mg total) every evening. 45 tablet 3  ? cetirizine (ZYRTEC) 10 MG tablet Take 1 tablet (10 mg total) by mouth 2 (two) times daily as needed for allergies (Can use an extra dose during flare ups.). 180 tablet 1  ? Cholecalciferol (VITAMIN D3 PO) Take 50 mg by mouth daily.    ? diphenhydrAMINE (BENADRYL) 25 MG tablet Take 2 tablets by mouth as needed for anaphylaxis.    ? EPINEPHrine 0.3 mg/0.3 mL IJ SOAJ injection Inject 0.3 mLs (0.3 mg total) into the muscle once as needed (anaphylaxis). 2 Device 0  ? famotidine (PEPCID) 40 MG tablet Take 1 tablet (40 mg total) by mouth at bedtime. 90 tablet 1  ? fluticasone (FLONASE) 50 MCG/ACT nasal spray Place 1 spray into both nostrils daily. 48 g 1  ? Fluticasone Furoate 50 MCG/ACT AEPB Inhale into the lungs.    ? furosemide (LASIX) 40 MG tablet Take 1 tablet (40 mg total) by mouth daily. 30 tablet 3  ? hydrALAZINE (APRESOLINE) 25 MG tablet TAKE 1 TABLET THREE TIMES DAILY AS NEEDED FOR SYSTOLIC BLOOD PRESSURE GREATER THAN 150 (NEED MD APPOINTMENT) 90 tablet 11  ? ipratropium-albuterol (DUONEB) 0.5-2.5 (3) MG/3ML SOLN     ? isosorbide mononitrate (IMDUR) 30 MG 24 hr tablet Take 1 tablet (30 mg total) by mouth daily. 90 tablet 3  ? mexiletine (MEXITIL) 250 MG capsule Take 1 capsule (250 mg total) by mouth 2 (two) times daily. 60 capsule 11  ? montelukast (SINGULAIR) 10 MG tablet Take 10 mg by mouth at bedtime.    ? Multiple Vitamins-Minerals (AIRBORNE PO) Take 1 tablet by mouth daily.    ? omeprazole (PRILOSEC) 40 MG capsule Take 1 capsule (40 mg total) by mouth every Hull. 90 capsule 1  ? Probiotic Product (PROBIOTIC DAILY PO) Take by mouth.    ? simvastatin (ZOCOR) 40 MG tablet TAKE 1 TABLET EVERY DAY 90 tablet 3  ? Spacer/Aero-Holding Chambers (AEROCHAMBER MV) inhaler Use as instructed 1 each 2  ? torsemide (DEMADEX) 20 MG tablet TAKE ONE TABLET BY MOUTH IN THE Hull AND HALF TABLET AT 2PM 90 tablet 0   ? ?No current facility-administered medications for this visit.  ? ? ?Allergies:   Nexium [esomeprazole magnesium], Other, Codeine, Ace inhibitors, Latex, Monosodium glutamate, Amlodipine, Shellfish allergy, and Tape  ? ?Social History:  The patient  reports that she quit smoking about 5 years ago. Her smoking use included cigarettes. She has a 3.75 pack-year smoking history. She has never used smokeless tobacco. She reports that she does not currently use alcohol. She reports that she does not use drugs.  ? ?Family History:  The patient's family history includes Alzheimer's disease in her sister; Breast cancer (age of onset: 5) in her sister; Cancer in her sister; Diabetes in her sister; Esophageal cancer in her brother; Heart disease in her brother, brother, father, mother, sister, and sister; Hyperlipidemia in her sister; Hypertension in  her brother and sister.  ? ? ?ROS:  Please see the history of present illness.   Otherwise, review of systems is positive for none.   All other systems are reviewed and negative.  ? ? ?PHYSICAL EXAM: ?VS:  BP 108/64   Pulse 67   Ht '5\' 5"'$  (1.651 m)   Wt 168 lb (76.2 kg)   SpO2 97%   BMI 27.96 kg/m?  , BMI Body mass index is 27.96 kg/m?. ?GEN: Well nourished, well developed, in no acute distress  ?HEENT: normal  ?Neck: no JVD, carotid bruits, or masses ?Cardiac: RRR; no murmurs, rubs, or gallops,no edema  ?Respiratory:  clear to auscultation bilaterally, normal work of breathing ?GI: soft, nontender, nondistended, + BS ?MS: no deformity or atrophy  ?Skin: warm and dry ?Neuro:  Strength and sensation are intact ?Psych: euthymic mood, full affect ? ?EKG:  EKG is ordered today. ?Personal review of the ekg ordered shows sinus rhythm ? ?Recent Labs: ?08/16/2021: BUN 14; Creatinine, Ser 1.08; Potassium 3.9; Sodium 143  ? ? ?Lipid Panel  ?   ?Component Value Date/Time  ? CHOL 129 01/04/2016 0853  ? TRIG 104 01/04/2016 0853  ? HDL 51 01/04/2016 0853  ? CHOLHDL 2.5 01/04/2016 0853  ?  VLDL 21 01/04/2016 0853  ? [place] 57 01/04/2016 0853  ? ? ? ?Wt Readings from Last 3 Encounters:  ?08/31/21 168 lb (76.2 kg)  ?08/16/21 168 lb 12.8 oz (76.6 kg)  ?06/15/21 169 lb 3.2 oz (76.7 kg)  ?  ? ? ?Other studies R

## 2021-09-01 ENCOUNTER — Observation Stay (HOSPITAL_COMMUNITY): Payer: Medicare HMO

## 2021-09-01 ENCOUNTER — Encounter (HOSPITAL_BASED_OUTPATIENT_CLINIC_OR_DEPARTMENT_OTHER): Payer: Self-pay | Admitting: Emergency Medicine

## 2021-09-01 ENCOUNTER — Emergency Department (HOSPITAL_BASED_OUTPATIENT_CLINIC_OR_DEPARTMENT_OTHER): Payer: Medicare HMO

## 2021-09-01 DIAGNOSIS — N189 Chronic kidney disease, unspecified: Secondary | ICD-10-CM | POA: Diagnosis not present

## 2021-09-01 DIAGNOSIS — E876 Hypokalemia: Secondary | ICD-10-CM | POA: Diagnosis not present

## 2021-09-01 DIAGNOSIS — Z7982 Long term (current) use of aspirin: Secondary | ICD-10-CM | POA: Diagnosis not present

## 2021-09-01 DIAGNOSIS — R519 Headache, unspecified: Secondary | ICD-10-CM

## 2021-09-01 DIAGNOSIS — I499 Cardiac arrhythmia, unspecified: Secondary | ICD-10-CM | POA: Diagnosis present

## 2021-09-01 DIAGNOSIS — I639 Cerebral infarction, unspecified: Secondary | ICD-10-CM | POA: Diagnosis not present

## 2021-09-01 DIAGNOSIS — Z91040 Latex allergy status: Secondary | ICD-10-CM | POA: Diagnosis not present

## 2021-09-01 DIAGNOSIS — E785 Hyperlipidemia, unspecified: Secondary | ICD-10-CM

## 2021-09-01 DIAGNOSIS — I509 Heart failure, unspecified: Secondary | ICD-10-CM | POA: Diagnosis not present

## 2021-09-01 DIAGNOSIS — N179 Acute kidney failure, unspecified: Secondary | ICD-10-CM | POA: Diagnosis present

## 2021-09-01 DIAGNOSIS — G459 Transient cerebral ischemic attack, unspecified: Secondary | ICD-10-CM | POA: Diagnosis present

## 2021-09-01 DIAGNOSIS — U071 COVID-19: Secondary | ICD-10-CM | POA: Diagnosis not present

## 2021-09-01 DIAGNOSIS — Z87891 Personal history of nicotine dependence: Secondary | ICD-10-CM | POA: Diagnosis not present

## 2021-09-01 DIAGNOSIS — I110 Hypertensive heart disease with heart failure: Secondary | ICD-10-CM | POA: Diagnosis not present

## 2021-09-01 DIAGNOSIS — G319 Degenerative disease of nervous system, unspecified: Secondary | ICD-10-CM | POA: Diagnosis not present

## 2021-09-01 DIAGNOSIS — I2510 Atherosclerotic heart disease of native coronary artery without angina pectoris: Secondary | ICD-10-CM | POA: Diagnosis not present

## 2021-09-01 DIAGNOSIS — I10 Essential (primary) hypertension: Secondary | ICD-10-CM | POA: Diagnosis not present

## 2021-09-01 DIAGNOSIS — E119 Type 2 diabetes mellitus without complications: Secondary | ICD-10-CM | POA: Diagnosis not present

## 2021-09-01 DIAGNOSIS — J45909 Unspecified asthma, uncomplicated: Secondary | ICD-10-CM | POA: Diagnosis not present

## 2021-09-01 DIAGNOSIS — Z79899 Other long term (current) drug therapy: Secondary | ICD-10-CM | POA: Diagnosis not present

## 2021-09-01 LAB — CBC
HCT: 40.6 % (ref 36.0–46.0)
Hemoglobin: 13.1 g/dL (ref 12.0–15.0)
MCH: 29.1 pg (ref 26.0–34.0)
MCHC: 32.3 g/dL (ref 30.0–36.0)
MCV: 90.2 fL (ref 80.0–100.0)
Platelets: 216 10*3/uL (ref 150–400)
RBC: 4.5 MIL/uL (ref 3.87–5.11)
RDW: 13.5 % (ref 11.5–15.5)
WBC: 4.6 10*3/uL (ref 4.0–10.5)
nRBC: 0 % (ref 0.0–0.2)

## 2021-09-01 LAB — URINALYSIS, COMPLETE (UACMP) WITH MICROSCOPIC
Bilirubin Urine: NEGATIVE
Glucose, UA: NEGATIVE mg/dL
Hgb urine dipstick: NEGATIVE
Ketones, ur: NEGATIVE mg/dL
Nitrite: NEGATIVE
Protein, ur: NEGATIVE mg/dL
Specific Gravity, Urine: 1.016 (ref 1.005–1.030)
pH: 6 (ref 5.0–8.0)

## 2021-09-01 LAB — COMPREHENSIVE METABOLIC PANEL
ALT: 7 U/L (ref 0–44)
AST: 12 U/L — ABNORMAL LOW (ref 15–41)
Albumin: 2.7 g/dL — ABNORMAL LOW (ref 3.5–5.0)
Alkaline Phosphatase: 46 U/L (ref 38–126)
Anion gap: 7 (ref 5–15)
BUN: 13 mg/dL (ref 8–23)
CO2: 20 mmol/L — ABNORMAL LOW (ref 22–32)
Calcium: 6.1 mg/dL — CL (ref 8.9–10.3)
Chloride: 117 mmol/L — ABNORMAL HIGH (ref 98–111)
Creatinine, Ser: 0.72 mg/dL (ref 0.44–1.00)
GFR, Estimated: >60 mL/min (ref >60)
Glucose, Bld: 61 mg/dL — ABNORMAL LOW (ref 70–99)
Potassium: 2.6 mmol/L — CL (ref 3.5–5.1)
Sodium: 144 mmol/L (ref 135–145)
Total Bilirubin: 0.5 mg/dL (ref 0.3–1.2)
Total Protein: 4.7 g/dL — ABNORMAL LOW (ref 6.5–8.1)

## 2021-09-01 LAB — CBG MONITORING, ED: Glucose-Capillary: 113 mg/dL — ABNORMAL HIGH (ref 70–99)

## 2021-09-01 MED ORDER — POTASSIUM CHLORIDE CRYS ER 20 MEQ PO TBCR
40.0000 meq | EXTENDED_RELEASE_TABLET | Freq: Once | ORAL | Status: AC
  Administered 2021-09-01: 40 meq via ORAL
  Filled 2021-09-01: qty 2

## 2021-09-01 MED ORDER — SIMVASTATIN 20 MG PO TABS
40.0000 mg | ORAL_TABLET | Freq: Every day | ORAL | Status: DC
Start: 2021-09-02 — End: 2021-09-02

## 2021-09-01 MED ORDER — OYSTER SHELL CALCIUM/D3 500-5 MG-MCG PO TABS
1.0000 | ORAL_TABLET | Freq: Every day | ORAL | Status: DC
  Administered 2021-09-01 – 2021-09-02 (×2): 1 via ORAL
  Filled 2021-09-01 (×2): qty 1

## 2021-09-01 MED ORDER — ENOXAPARIN SODIUM 40 MG/0.4ML IJ SOSY
40.0000 mg | PREFILLED_SYRINGE | INTRAMUSCULAR | Status: DC
  Filled 2021-09-01: qty 0.4

## 2021-09-01 MED ORDER — ACETAMINOPHEN 160 MG/5ML PO SOLN: 650.0000 mg | ORAL | Status: DC | PRN

## 2021-09-01 MED ORDER — CALCIUM CARBONATE 1250 (500 CA) MG PO TABS
1.0000 | ORAL_TABLET | Freq: Every day | ORAL | Status: DC
  Filled 2021-09-01: qty 1

## 2021-09-01 MED ORDER — ACETAMINOPHEN 500 MG PO TABS
1000.0000 mg | ORAL_TABLET | Freq: Once | ORAL | Status: AC
  Administered 2021-09-01: 1000 mg via ORAL
  Filled 2021-09-01: qty 2

## 2021-09-01 MED ORDER — STROKE: EARLY STAGES OF RECOVERY BOOK
Freq: Once | Status: AC
  Filled 2021-09-01: qty 1

## 2021-09-01 MED ORDER — CALCIUM GLUCONATE-NACL 2-0.675 GM/100ML-% IV SOLN
2.0000 g | Freq: Once | INTRAVENOUS | Status: AC
  Administered 2021-09-01: 2000 mg via INTRAVENOUS
  Filled 2021-09-01: qty 100

## 2021-09-01 MED ORDER — FLUTICASONE PROPIONATE 50 MCG/ACT NA SUSP
1.0000 | Freq: Every day | NASAL | Status: DC
  Administered 2021-09-01: 1 via NASAL
  Filled 2021-09-01: qty 16

## 2021-09-01 MED ORDER — ACETAMINOPHEN 325 MG PO TABS
650.0000 mg | ORAL_TABLET | ORAL | Status: DC | PRN
  Administered 2021-09-01 (×2): 650 mg via ORAL
  Filled 2021-09-01 (×3): qty 2

## 2021-09-01 MED ORDER — UMECLIDINIUM BROMIDE 62.5 MCG/ACT IN AEPB
1.0000 | INHALATION_SPRAY | Freq: Every day | RESPIRATORY_TRACT | Status: DC
  Administered 2021-09-02: 1 via RESPIRATORY_TRACT
  Filled 2021-09-01: qty 7

## 2021-09-01 MED ORDER — ASPIRIN EC 81 MG PO TBEC
81.0000 mg | DELAYED_RELEASE_TABLET | Freq: Every day | ORAL | Status: DC
  Administered 2021-09-01: 81 mg via ORAL
  Filled 2021-09-01: qty 1

## 2021-09-01 MED ORDER — SODIUM CHLORIDE 0.9 % IV SOLN: INTRAVENOUS | Status: DC

## 2021-09-01 MED ORDER — POTASSIUM CHLORIDE CRYS ER 10 MEQ PO TBCR
40.0000 meq | EXTENDED_RELEASE_TABLET | ORAL | Status: AC
  Administered 2021-09-02: 40 meq via ORAL
  Filled 2021-09-01: qty 4

## 2021-09-01 MED ORDER — ACETAMINOPHEN 650 MG RE SUPP: 650.0000 mg | RECTAL | Status: DC | PRN

## 2021-09-01 MED ORDER — MOMETASONE FURO-FORMOTEROL FUM 100-5 MCG/ACT IN AERO
2.0000 | INHALATION_SPRAY | Freq: Two times a day (BID) | RESPIRATORY_TRACT | Status: DC
  Administered 2021-09-01 – 2021-09-02 (×2): 2 via RESPIRATORY_TRACT
  Filled 2021-09-01: qty 8.8

## 2021-09-01 MED ORDER — LORATADINE 10 MG PO TABS
10.0000 mg | ORAL_TABLET | Freq: Every day | ORAL | Status: DC
Start: 2021-09-02 — End: 2021-09-02

## 2021-09-01 MED ORDER — STROKE: EARLY STAGES OF RECOVERY BOOK
Status: AC
  Filled 2021-09-01: qty 1

## 2021-09-01 MED ORDER — PANTOPRAZOLE SODIUM 40 MG PO TBEC
40.0000 mg | DELAYED_RELEASE_TABLET | Freq: Every day | ORAL | Status: DC
  Administered 2021-09-02: 40 mg via ORAL
  Filled 2021-09-01: qty 1

## 2021-09-01 MED ORDER — BUDESON-GLYCOPYRROL-FORMOTEROL 160-9-4.8 MCG/ACT IN AERO: 2.0000 | INHALATION_SPRAY | Freq: Two times a day (BID) | RESPIRATORY_TRACT | Status: DC

## 2021-09-01 MED ORDER — MONTELUKAST SODIUM 10 MG PO TABS
10.0000 mg | ORAL_TABLET | Freq: Every day | ORAL | Status: DC
  Administered 2021-09-01: 10 mg via ORAL
  Filled 2021-09-01: qty 1

## 2021-09-01 MED ORDER — BUDESONIDE 0.25 MG/2ML IN SUSP: 0.2500 mg | Freq: Two times a day (BID) | RESPIRATORY_TRACT | Status: DC | PRN

## 2021-09-01 MED ORDER — ENOXAPARIN SODIUM 30 MG/0.3ML IJ SOSY
30.0000 mg | PREFILLED_SYRINGE | INTRAMUSCULAR | Status: DC
  Administered 2021-09-01: 30 mg via SUBCUTANEOUS
  Filled 2021-09-01: qty 0.3

## 2021-09-01 NOTE — Consult Note (Signed)
?                    NEURO HOSPITALIST CONSULT NOTE  ? ?Requestig physician: Dr. Tamala Julian ? ?Reason for Consult: Transient episodes of severe left neck and head pain, the second accompanied by paresthesias of digits 2-5 of the right hand.  ? ?History obtained from:  Patient and Chart    ? ?HPI:                                                                                                                                         ? Megan Hull is an 81 y.o. female with a PMHx of CHF, DM, HLD, HTN, heart murmur, supraventricular arrhythmia and tobacco abuse who presended to the ED yesterday for evaluation of acute onset posterior head and neck pain in conjunction with pins/needles sensation in right hand that has since subsided. She states that the only thing unusual that has occurred recently is that she has been doing some Spring gardening, requiring her to be bent over and lifting things with her arms, but notes that she did not experience any pain during this activity. Her first episode of pain started at the base of her neck along the left posterior-lateral aspect, radiating up the neck to also involve the back of her head, the top of her head and hear forehead. The pain was steady and stabbing, "like a sword" going through her neck and head. Pain was initially 10/10, then decreased to 4/10 prior to her going to bed for the night. The next morning the pain was gone and she felt fine. However, later in the day she had a recurrent episode of the pain with identical symptoms to the first episode, except for additional symptom of numbness and paresthesias of digits 2-5 of her right hand, sparing the palm and the dorsum of the hand. These symptoms also spontaneously subsided, but she decided to be seen for what she felt might have been a stroke.  ? ?She does note that she has a prior history of neck surgery for disc disease.  ? ?Past Medical History:  ?Diagnosis Date  ? Arthritis   ? CHF (congestive heart  failure) ([place])   ? Diabetes mellitus without complication ([place])   ? borderline  ? Diarrhea   ? Diverticulosis   ? Dysrhythmia   ? controlled with Metoprolol-12-19-13 LOV -Dr. Gwenlyn Found sees yearly  ? Family history of premature CAD   ? GERD (gastroesophageal reflux disease)   ? Heart murmur   ? yrs ago  ? HLD (hyperlipidemia)   ? pt reports cholesterol normal now (07/21/19).  ? Hypertension   ? Lower extremity edema   ? Status post dilation of esophageal narrowing   ? Supraventricular arrhythmia   ? Tobacco abuse   ? ? ?Past Surgical History:  ?Procedure Laterality Date  ? ANTERIOR CERVICAL DECOMP/DISCECTOMY FUSION N/A 06/08/2015  ?  Procedure: CERVICAL FIVE-SIX ,CERVICAL SIX-SEVEN ANTERIOR CERVICAL DECOMPRESSION/DISCECTOMY FUSION PLATING BONEGRAFT;  Surgeon: Kristeen Miss, MD;  Location: [facility];  Service: Neurosurgery;  Laterality: N/A;  ? CARDIAC CATHETERIZATION  01/15/2009  ? non-critical CAD, 60% mid LAD lesion (Dr. Adora Fridge)  ? CATARACT EXTRACTION, BILATERAL    ? most recent 11/19  ? COLONOSCOPY    ? ESOPHAGEAL DILATION  2014  ? infected lymph node surgery  80's  ? NM MYOCAR PERF WALL MOTION  04/2011  ? bruce myoview; normal pattern of perfusion, EF 80%, low risk scan  ? SINUS ENDO WITH FUSION Bilateral 03/04/2020  ? Procedure: SINUS ENDOSCOPY WITH FUSION NAVIGATION;  Surgeon: Jerrell Belfast, MD;  Location: [facility] [facility];  Service: ENT;  Laterality: Bilateral;  ? TONSILLECTOMY  70's  ? TRANSTHORACIC ECHOCARDIOGRAM  12/2007  ? mild conc LVH  ? TURBINATE REDUCTION Bilateral 03/04/2020  ? Procedure: TURBINATE REDUCTION;  Surgeon: Jerrell Belfast, MD;  Location: [facility];  Service: ENT;  Laterality: Bilateral;  ? ? ?Family History  ?Problem Relation Age of Onset  ? Heart disease Brother   ?     MI @ 24, HTN @ 54, DM @ 39   ? Heart disease Father   ?     MI  ? Heart disease Mother   ?     MI, CVA @ 85  ? Heart disease Sister   ?     CHF, CVA  ? Breast cancer Sister 14  ? Esophageal cancer  Brother   ? Alzheimer's disease Sister   ? Heart disease Brother   ?     MI in 39s, lung cancer  ? Heart disease Sister   ? Hypertension Sister   ?     x2  ? Hyperlipidemia Sister   ?     x2  ? Diabetes Sister   ?     x2  ? Cancer Sister   ? Hypertension Brother   ? Colon cancer Neg Hx   ?          ? ?Social History:  reports that she quit smoking about 5 years ago. Her smoking use included cigarettes. She has a 3.75 pack-year smoking history. She has never used smokeless tobacco. She reports that she does not currently use alcohol. She reports that she does not use drugs. ? ?Allergies  ?Allergen Reactions  ? Nexium [Esomeprazole Magnesium] Anaphylaxis  ?  Pt states she was unable to swallow for several hours after taking the nexium  ? Other Hives, Shortness Of Breath and Other (See Comments)  ?  Red Fish (Hives) ?Bojangle's chicken (swelling and shortness of breath) PAPRIKA  ? Codeine Other (See Comments)  ?  REACTION: chest pain  ? Ace Inhibitors Swelling  ?  Angioedema ?  ? Latex Other (See Comments)  ?  Causes skin to turn red per patient   ? Monosodium Glutamate Other (See Comments)  ?  dizziness  ? Amlodipine Other (See Comments)  ?  Lower extremity swelling, itching  ? Shellfish Allergy Hives  ? Tape Rash  ?  Coban wrap turned her arm red (after stress test)  ? ? ?HOME MEDICATIONS:                                                                                                                     ? ?  No current facility-administered medications on file prior to encounter.  ? ?Current Outpatient Medications on File Prior to Encounter  ?Medication Sig Dispense Refill  ? albuterol (VENTOLIN HFA) 108 (90 Base) MCG/ACT inhaler Inhale 2 puffs into the lungs every 4 (four) hours as needed for wheezing or shortness of breath. 18 g 0  ? Alum & Mag Hydroxide-Simeth (ANTACID ADVANCED PO) Take 1 tablet by mouth daily as needed (indigestion).     ? Budeson-Glycopyrrol-Formoterol (BREZTRI AEROSPHERE) 160-9-4.8 MCG/ACT AERO  Inhale 2 puffs into the lungs in the morning and at bedtime. 32.1 g 1  ? budesonide (PULMICORT) 0.25 MG/2ML nebulizer solution Take 0.25 mg by nebulization 2 (two) times daily as needed (shortness of breath, wheezing).    ? carvedilol (COREG) 12.5 MG tablet Take 1 tablet (12.5 mg total) by mouth every morning AND 0.5 tablets (6.25 mg total) every evening. 45 tablet 3  ? cetirizine (ZYRTEC) 10 MG tablet Take 1 tablet (10 mg total) by mouth 2 (two) times daily as needed for allergies (Can use an extra dose during flare ups.). 180 tablet 1  ? Cholecalciferol (VITAMIN D3 PO) Take 50 mg by mouth daily.    ? diphenhydrAMINE (BENADRYL) 25 MG tablet Take 2 tablets by mouth as needed for anaphylaxis.    ? EPINEPHrine 0.3 mg/0.3 mL IJ SOAJ injection Inject 0.3 mLs (0.3 mg total) into the muscle once as needed (anaphylaxis). 2 Device 0  ? famotidine (PEPCID) 40 MG tablet Take 1 tablet (40 mg total) by mouth at bedtime. 90 tablet 1  ? fluticasone (FLONASE) 50 MCG/ACT nasal spray Place 1 spray into both nostrils daily. 48 g 1  ? furosemide (LASIX) 40 MG tablet Take 1 tablet (40 mg total) by mouth daily. 30 tablet 3  ? hydrALAZINE (APRESOLINE) 25 MG tablet TAKE 1 TABLET THREE TIMES DAILY AS NEEDED FOR SYSTOLIC BLOOD PRESSURE GREATER THAN 150 (NEED MD APPOINTMENT) (Patient taking differently: Take 25 mg by mouth 3 (three) times daily as needed (Systolic blood pressure greater than 150).) 90 tablet 11  ? ipratropium-albuterol (DUONEB) 0.5-2.5 (3) MG/3ML SOLN Take 3 mLs by nebulization every 6 (six) hours as needed (wheezing, shortness of breath).    ? isosorbide mononitrate (IMDUR) 30 MG 24 hr tablet Take 1 tablet (30 mg total) by mouth daily. 90 tablet 3  ? mexiletine (MEXITIL) 250 MG capsule Take 1 capsule (250 mg total) by mouth 2 (two) times daily. 60 capsule 11  ? montelukast (SINGULAIR) 10 MG tablet Take 10 mg by mouth at bedtime.    ? Multiple Vitamins-Minerals (AIRBORNE PO) Take 1 tablet by mouth daily.    ? omeprazole  (PRILOSEC) 40 MG capsule Take 1 capsule (40 mg total) by mouth every morning. 90 capsule 1  ? Probiotic Product (PROBIOTIC DAILY PO) Take 1 capsule by mouth daily.    ? simvastatin (ZOCOR) 40 MG tablet TAKE 1 TABLE

## 2021-09-01 NOTE — H&P (Addendum)
?History and Physical  ? ? ?Patient: GLESSIE EUSTICE VOZ:[id number] DOB: [birth year] ?DOA: 08/31/2021 ?DOS: the patient was seen and examined on 09/01/2021 ?PCP: [name], DO  ?Patient coming from: [facility] ? ?Chief Complaint:  ?Chief Complaint  ?Patient presents with  ? Head Pain  ? ?HPI: ADELAE YODICE is a 81 y.o. female with medical history significant of hypertension, hyperlipidemia, CAD, diabetes mellitus type 2, aarrhythmia, nonrheumatic aortic valve stenosis, bilateral carotid artery stenosis, persistent asthma, and tobacco abuse who presented with complaints of headache started after she ate dinner between 8:30-9 PM last night.  She describes having a sharp piercing pain started in the back of her head radiating to the front with a pins and needle sensation in her right hand.  The pins and needle sensation lasted approximately 30 minutes prior to self resolving.  She had had similar symptoms the night before, but without the sensation in her right hand.  She reports that last night when she checked her blood pressure it was elevated up to 191/80.  She had taken her home blood pressure medications at that time.  However, patient notes that she has had very sporadic blood pressures where it is sometimes very high and then sometimes very low.  She takes her blood pressure medications it seems as needed. ? ?Patient was seen by Dr. Curt Bears yesterday for PVCs after wearing Holter monitor and 07/10/2021.  Monitor noted sinus rhythm, sinus bradycardia, sinus tachycardia, runs of SVT, and frequent PVCs (20%).  After discussions patient wanted to avoid ablation if possible and therefore was started on mexiletine 250 mg twice daily. ? ?Upon admission into the emergency department patient was noted to be afebrile, pulse 44-97, blood pressure 108/64 -183/69, and all other vital signs maintained.  CT scan of the head noted no acute intercranial abnormality and chronic small vessel disease with frontal and  ethmoid sinusitis.  Labs from 3/22 significant for BUN 18, creatinine 1.54.  Not a candidate for tPA as symptoms were resolving.  Case was discussed with Dr. Cheral Marker based off the ABCDE score recommended admission for TIA work-up.  MRI of the brain did not note any acute abnormalities.  Repeat blood work done this morning revealed potassium 2.4, creatinine 0.72, glucose 61, and calcium 6.1.  Patient was given 40 mEq of potassium chloride p.o. ? ?Review of Systems: As mentioned in the history of present illness. All other systems reviewed and are negative. ?Past Medical History:  ?Diagnosis Date  ? Arthritis   ? CHF (congestive heart failure) ([place])   ? Diabetes mellitus without complication ([place])   ? borderline  ? Diarrhea   ? Diverticulosis   ? Dysrhythmia   ? controlled with Metoprolol-12-19-13 LOV -Dr. Gwenlyn Found sees yearly  ? Family history of premature CAD   ? GERD (gastroesophageal reflux disease)   ? Heart murmur   ? yrs ago  ? HLD (hyperlipidemia)   ? pt reports cholesterol normal now (07/21/19).  ? Hypertension   ? Lower extremity edema   ? Status post dilation of esophageal narrowing   ? Supraventricular arrhythmia   ? Tobacco abuse   ? ?Past Surgical History:  ?Procedure Laterality Date  ? ANTERIOR CERVICAL DECOMP/DISCECTOMY FUSION N/A 06/08/2015  ? Procedure: CERVICAL FIVE-SIX ,CERVICAL SIX-SEVEN ANTERIOR CERVICAL DECOMPRESSION/DISCECTOMY FUSION PLATING BONEGRAFT;  Surgeon: Kristeen Miss, MD;  Location: [facility];  Service: Neurosurgery;  Laterality: N/A;  ? CARDIAC CATHETERIZATION  01/15/2009  ? non-critical CAD, 60% mid LAD lesion (Dr. Adora Fridge)  ? CATARACT  EXTRACTION, BILATERAL    ? most recent 11/19  ? COLONOSCOPY    ? ESOPHAGEAL DILATION  2014  ? infected lymph node surgery  80's  ? NM MYOCAR PERF WALL MOTION  04/2011  ? bruce myoview; normal pattern of perfusion, EF 80%, low risk scan  ? SINUS ENDO WITH FUSION Bilateral 03/04/2020  ? Procedure: SINUS ENDOSCOPY WITH FUSION NAVIGATION;  Surgeon: Jerrell Belfast, MD;  Location: [facility];  Service: ENT;  Laterality: Bilateral;  ? TONSILLECTOMY  70's  ? TRANSTHORACIC ECHOCARDIOGRAM  12/2007  ? mild conc LVH  ? TURBINATE REDUCTION Bilateral 03/04/2020  ? Procedure: TURBINATE REDUCTION;  Surgeon: Jerrell Belfast, MD;  Location: [facility];  Service: ENT;  Laterality: Bilateral;  ? ?Social History:  reports that she quit smoking about 5 years ago. Her smoking use included cigarettes. She has a 3.75 pack-year smoking history. She has never used smokeless tobacco. She reports that she does not currently use alcohol. She reports that she does not use drugs. ? ?Allergies  ?Allergen Reactions  ? Nexium [Esomeprazole Magnesium] Anaphylaxis  ?  Pt states she was unable to swallow for several hours after taking the nexium  ? Other Hives, Shortness Of Breath and Other (See Comments)  ?  Red Fish (Hives) ?Bojangle's chicken (swelling and shortness of breath) PAPRIKA  ? Codeine Other (See Comments)  ?  REACTION: chest pain  ? Ace Inhibitors Swelling  ?  Angioedema ?  ? Latex Other (See Comments)  ?  Causes skin to turn red per patient   ? Monosodium Glutamate Other (See Comments)  ?  dizziness  ? Amlodipine Other (See Comments)  ?  Lower extremity swelling, itching  ? Shellfish Allergy Hives  ? Tape Rash  ?  Coban wrap turned her arm red (after stress test)  ? ? ?Family History  ?Problem Relation Age of Onset  ? Heart disease Brother   ?     MI @ 7, HTN @ 44, DM @ 78   ? Heart disease Father   ?     MI  ? Heart disease Mother   ?     MI, CVA @ 28  ? Heart disease Sister   ?     CHF, CVA  ? Breast cancer Sister 69  ? Esophageal cancer Brother   ? Alzheimer's disease Sister   ? Heart disease Brother   ?     MI in 24s, lung cancer  ? Heart disease Sister   ? Hypertension Sister   ?     x2  ? Hyperlipidemia Sister   ?     x2  ? Diabetes Sister   ?     x2  ? Cancer Sister   ? Hypertension Brother   ? Colon cancer Neg Hx   ? ? ?Prior to Admission medications    ?Medication Sig Start Date End Date Taking? Authorizing Provider  ?albuterol (VENTOLIN HFA) 108 (90 Base) MCG/ACT inhaler Inhale 2 puffs into the lungs every 4 (four) hours as needed for wheezing or shortness of breath. 06/25/20   Margarette Canada, NP  ?Alum & Mag Hydroxide-Simeth (ANTACID ADVANCED PO) Take 1 tablet by mouth daily as needed (indigestion).     [provider]  ?aspirin EC 81 MG tablet Take 1 tablet (81 mg total) by mouth daily. Swallow whole. 09/03/20   Deberah Pelton, NP  ?Budeson-Glycopyrrol-Formoterol (BREZTRI AEROSPHERE) 160-9-4.8 MCG/ACT AERO Inhale 2 puffs into the lungs in the morning  and at bedtime. 05/24/21   Kozlow, Donnamarie Poag, MD  ?budesonide (PULMICORT) 0.25 MG/2ML nebulizer solution Take by nebulization 2 (two) times daily. 12/02/20   [provider]  ?carvedilol (COREG) 12.5 MG tablet Take 1 tablet (12.5 mg total) by mouth every morning AND 0.5 tablets (6.25 mg total) every evening. 03/08/21   Cleaver, Jossie Ng, NP  ?cetirizine (ZYRTEC) 10 MG tablet Take 1 tablet (10 mg total) by mouth 2 (two) times daily as needed for allergies (Can use an extra dose during flare ups.). 08/12/21   Kozlow, Donnamarie Poag, MD  ?Cholecalciferol (VITAMIN D3 PO) Take 50 mg by mouth daily.    [provider]  ?diphenhydrAMINE (BENADRYL) 25 MG tablet Take 2 tablets by mouth as needed for anaphylaxis.    [provider]  ?EPINEPHrine 0.3 mg/0.3 mL IJ SOAJ injection Inject 0.3 mLs (0.3 mg total) into the muscle once as needed (anaphylaxis). 12/11/15   Patrecia Pour, MD  ?famotidine (PEPCID) 40 MG tablet Take 1 tablet (40 mg total) by mouth at bedtime. 05/24/21   Kozlow, Donnamarie Poag, MD  ?fluticasone (FLONASE) 50 MCG/ACT nasal spray Place 1 spray into both nostrils daily. 05/24/21   Kozlow, Donnamarie Poag, MD  ?Fluticasone Furoate 50 MCG/ACT AEPB Inhale into the lungs.    [provider]  ?furosemide (LASIX) 40 MG tablet Take 1 tablet (40 mg total) by mouth daily. 08/16/21   Lenna Sciara, NP   ?hydrALAZINE (APRESOLINE) 25 MG tablet TAKE 1 TABLET THREE TIMES DAILY AS NEEDED FOR SYSTOLIC BLOOD PRESSURE GREATER THAN 150 (NEED MD APPOINTMENT) 12/10/20   Deberah Pelton, NP  ?ipratropium-albuterol (DUONEB)

## 2021-09-01 NOTE — Assessment & Plan Note (Signed)
Acute.  Calcium 6.9.  Patient had been started on oral calcium supplement. ?-Give 2 g of calcium gluconate IV ?-Continue to monitor and replace as needed ? ?

## 2021-09-01 NOTE — ED Notes (Signed)
Patient's lights turned down for comfort. ?

## 2021-09-01 NOTE — Assessment & Plan Note (Addendum)
On admission blood pressures range from 108/64-183/69.  Home blood pressure regimen includes Coreg 12.5 mg every morning/6.25 mg nightly, torsemide 20 mg every morning/10 mg q. evening, hydralazine 25 mg 3 times daily, isosorbide mononitrate 30 mg daily, ?- ?

## 2021-09-01 NOTE — Evaluation (Signed)
Physical Therapy Evaluation ?Patient Details ?Name: Megan Hull ?MRN: [id number] ?DOB: Oct 02, 1940 ?Today's Date: 09/01/2021 ? ?History of Present Illness ? 81 y.o. female past medical history significant for diabetes, hypertension, hyperlipidemia, coronary artery disease with a 60% LAD lesion, and PVCs.  Admitted with c/o sharp pain emanating from the L side of her neck, near C7, radiating to her forehead, with an episode of tingling in the Right hand.  ?Clinical Impression ? Pt was able to ambulate in the hallway independently. 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 Occasionally supervision assist or min guard was provided for max safety and line management but pt was capable of independence for all tasks performed. Pt is at her baseline mobility and has no mobility concerns. No physical therapy follow-up needed in the acute setting or post acute.   ?   ? ?Recommendations for follow up therapy are one component of a multi-disciplinary discharge planning process, led by the attending physician.  Recommendations may be updated based on patient status, additional functional criteria and insurance authorization. ? ?Follow Up Recommendations No PT follow up ? ?  ?Assistance Recommended at Discharge None  ?Patient can return home with the following ?   ? ?  ?Equipment Recommendations None recommended by PT  ?Recommendations for Other Services ?    ?  ?Functional Status Assessment Patient has not had a recent decline in their functional status  ? ?  ?Precautions / Restrictions Precautions ?Precautions: None ?Restrictions ?Weight Bearing Restrictions: No  ? ?  ? ?Mobility ? Bed Mobility ?Overal bed mobility: Independent ?  ?  ?  ?  ?  ?  ?  ?  ? ?Transfers ?Overall transfer level: Independent ?Equipment used: None ?  ?  ?  ?  ?  ?  ?  ?  ?  ? ?Ambulation/Gait ?Ambulation/Gait assistance: Independent ?Gait Distance (Feet): 250 Feet ?Assistive device: None ?Gait  Pattern/deviations: WFL(Within Functional Limits), Step-through pattern ?  ?  ?  ?General Gait Details: Pt reported typically wearing shoes for ambulation but otherwise felt normal. ? ?Stairs ?Stairs: Yes ?Stairs assistance: Supervision ?Stair Management: No rails, Alternating pattern, Forwards ?Number of Stairs: 2 (x2) ?General stair comments: Pt required supervision assist on stairs for line management. ? ?Wheelchair Mobility ?  ? ?Modified Rankin (Stroke Patients Only) ?  ? ?  ? ?Balance Overall balance assessment: Independent ?  ?  ?  ?  ?  ?  ?  ?  ?  ?  ?  ?  ?  ?  ?  ?Standardized Balance Assessment ?Standardized Balance Assessment : Dynamic Gait Index ?  ?Dynamic Gait Index ?Level Surface: Normal ?Change in Gait Speed: Normal ?Gait with Horizontal Head Turns: Normal ?Gait with Vertical Head Turns: Normal ?Gait and Pivot Turn: Normal ?Step Over Obstacle: Normal ?Step Around Obstacles: Normal ?Steps: Normal ?Total Score: 24 ?   ? ? ? ?Pertinent Vitals/Pain Pain Assessment ?Pain Assessment: No/denies pain  ? ? ?Home Living Family/patient expects to be discharged to:: Private residence ?Living Arrangements: Alone ?Available Help at Discharge: Family;Available PRN/intermittently ?Type of Home: House ?Home Access: Stairs to enter ?Entrance Stairs-Rails: None ?Entrance Stairs-Number of Steps: 1 ?  ?Home Layout: Laundry or work area in basement;Able to live on main level with bedroom/bathroom ?Home Equipment: Advice worker (2 wheels) ?Additional Comments: May go to her sister's house which has one step to enter. Pt utilizes side step technique on stairs facing the railing to  the basement.  ?  ?Prior Function Prior Level of Function : Independent/Modified Independent;Driving ?  ?  ?  ?  ?  ?  ?  ?ADLs Comments: Continues to be very active, drives, mows her own grass, and relies on no one for ADL/IADL or mobility. Involved in church activities daily. ?  ? ? ?Hand Dominance  ? Dominant Hand: Right ? ?   ?Extremity/Trunk Assessment  ? Upper Extremity Assessment ?Upper Extremity Assessment: Defer to OT evaluation ?  ? ?Lower Extremity Assessment ?Lower Extremity Assessment: RLE deficits/detail;LLE deficits/detail ?RLE Deficits / Details: Hip Flexion 4+/5, all other MMT were 5/5 ?RLE Sensation: WNL ?LLE Deficits / Details: Hip Flexion 4+/5, all other MMT were 5/5.  numbness and tingling sensation that increases with touch from previous sciatic nerve injury. ?  ? ?Cervical / Trunk Assessment ?Cervical / Trunk Assessment: Normal  ?Communication  ? Communication: No difficulties  ?Cognition Arousal/Alertness: Awake/alert ?Behavior During Therapy: [facility] for tasks assessed/performed ?Overall Cognitive Status: Within Functional Limits for tasks assessed ?  ?  ?  ?  ?  ?  ?  ?  ?  ?  ?  ?  ?  ?  ?  ?  ?General Comments: A+Ox4 ?  ?  ? ?  ?General Comments   ? ?  ?Exercises    ? ?Assessment/Plan  ?  ?PT Assessment Patient does not need any further PT services  ?PT Problem List   ? ?   ?  ?PT Treatment Interventions     ? ?PT Goals (Current goals can be found in the Care Plan section)  ?  ? ?  ?Frequency   ?  ? ? ?Co-evaluation   ?  ?  ?  ?  ? ? ?  ?AM-PAC PT "6 Clicks" Mobility  ?Outcome Measure Help needed turning from your back to your side while in a flat bed without using bedrails?: None ?Help needed moving from lying on your back to sitting on the side of a flat bed without using bedrails?: None ?Help needed moving to and from a bed to a chair (including a wheelchair)?: None ?Help needed standing up from a chair using your arms (e.g., wheelchair or bedside chair)?: None ?Help needed to walk in hospital room?: None ?Help needed climbing 3-5 steps with a railing? : None ?6 Click Score: 24 ? ?  ?End of Session Equipment Utilized During Treatment: Gait belt ?Activity Tolerance: Patient tolerated treatment well ?Patient left: in bed;with call bell/phone within reach ?  ?  ?  ? ?Time: 1655-3748 ?PT Time Calculation (min) (ACUTE  ONLY): 26 min ? ? ?Charges:   PT Evaluation ?$PT Eval Low Complexity: 1 Low ?  ?  ?   ? ? ?Quenton Fetter, SPT ? ? ?Quenton Fetter ?09/01/2021, 5:16 PM ? ?

## 2021-09-01 NOTE — Assessment & Plan Note (Addendum)
Home medication regimen includes simvastatin 40 mg daily. ?-Follow-up lipid panel ?-Continue simvastatin ?-Goal LDL less than 70.  Consider need of adjustment depending on findings. ?

## 2021-09-01 NOTE — Assessment & Plan Note (Addendum)
Acute.  Repeat potassium noted to be 2.6.  Patient has been given potassium chloride 40 mEq. ?-Check magnesium level ?-Recheck potassium levels and replace as needed ?

## 2021-09-01 NOTE — Assessment & Plan Note (Signed)
Heart rates appear to range from 44-97. Patient had been just been evaluated by Dr. Curt Bears electrophysiology yesterday after wearing heart monitor 07/10/2021. Monitor noted sinus rhythm, sinus bradycardia, sinus tachycardia, runs of SVT, and frequent PVCs (20%).  Patient did not want to undergo ablation at that time and mexiletine 250 mg twice daily.   ?-Follow-up telemetry ?

## 2021-09-01 NOTE — Progress Notes (Signed)
?  Transition of Care (TOC) Screening Note ? ? ?Patient Details  ?Name: Megan Hull ?Date of Birth: 07/09/1940 ? ? ?Transition of Care (TOC) CM/SW Contact:    ?Pollie Friar, RN ?Phone Number: ?09/01/2021, 3:49 PM ? ? ? ?Transition of Care Department [facility]) has reviewed patient. We will continue to monitor patient advancement through interdisciplinary progression rounds. If new patient transition needs arise, please place a TOC consult. ?  ?

## 2021-09-01 NOTE — Assessment & Plan Note (Signed)
Acute.  Patient presented with complaints of a posterior headache and right-sided pins/needles sensation in her head and neck quickly resolved.  CT scan of the brain without contrast did not note any acute signs of stroke.  She is not a candidate for tPA due to symptoms resolving.  Case had been discussed with Dr. Cheral Marker who recommended admission for completion of TIA work-up. ?-Admit to telemetry bed ?-Stroke order set initiated ?-Neuro checks ?-Check Hemoglobin A1c and lipid panel  ?-Check  MRI  ?-Check echocardiogram ?- PT/OT/Speech to eval and treat ?-ASA ?- [place] [facility] neurology consultative services, will follow-up for any further recommendations ? ?

## 2021-09-01 NOTE — ED Notes (Signed)
Patient resting, eyes closed.  Respirations even and unlabored, in NAD.   ?

## 2021-09-01 NOTE — Evaluation (Signed)
Occupational Therapy Evaluation ?Patient Details ?Name: Megan Hull ?MRN: [id number] ?DOB: 19-Mar-1941 ?Today's Date: 09/01/2021 ? ? ?History of Present Illness 81 y.o. female past medical history significant for diabetes, hypertension, hyperlipidemia, coronary artery disease with a 60% LAD lesion, and PVCs.  Admitted with c/o sharp pain emanating from the L side of her neck, near C7, radiating to her forehead, with an episode of tingling in the Right hand.  ? ?Clinical Impression ?  ?Patient admitted for the diagnosis above.  PTA she lives alone, and needs no assist for any aspect of mobility, ADL/IADL.  Currently she endorses a resolution of all symptoms, and is hoping to go home soon.  PT assessment pending, but no OT needs identified.  Patient is up and moving in her room without any assist.  No post acute OT needs are anticipated.    ?   ? ?Recommendations for follow up therapy are one component of a multi-disciplinary discharge planning process, led by the attending physician.  Recommendations may be updated based on patient status, additional functional criteria and insurance authorization.  ? ?Follow Up Recommendations ? No OT follow up  ?  ?Assistance Recommended at Discharge None  ?Patient can return home with the following   ? ?  ?Functional Status Assessment ? Patient has not had a recent decline in their functional status  ?Equipment Recommendations ? None recommended by OT  ?  ?Recommendations for Other Services   ? ? ?  ?Precautions / Restrictions Precautions ?Precautions: None ?Restrictions ?Weight Bearing Restrictions: No  ? ?  ? ?Mobility Bed Mobility ?Overal bed mobility: Independent ?  ?  ?  ?  ?  ?  ?  ?  ? ?Transfers ?Overall transfer level: Independent ?  ?  ?  ?  ?  ?  ?  ?  ?  ?  ? ?  ?Balance Overall balance assessment: No apparent balance deficits (not formally assessed) ?  ?  ?  ?  ?  ?  ?  ?  ?  ?  ?  ?  ?  ?  ?  ?  ?  ?  ?   ? ?ADL either performed or assessed with clinical  judgement  ? ?ADL Overall ADL's : At baseline ?  ?  ?  ?  ?  ?  ?  ?  ?  ?  ?  ?  ?  ?  ?  ?  ?  ?  ?  ?   ? ? ? ?Vision Patient Visual Report: No change from baseline ?   ?   ?Perception Perception ?Perception: Within Functional Limits ?  ?Praxis Praxis ?Praxis: Intact ?  ? ?Pertinent Vitals/Pain Pain Assessment ?Pain Assessment: No/denies pain  ? ? ? ?Hand Dominance Right ?  ?Extremity/Trunk Assessment Upper Extremity Assessment ?Upper Extremity Assessment: Overall WFL for tasks assessed ?  ?Lower Extremity Assessment ?Lower Extremity Assessment: Defer to PT evaluation ?  ?Cervical / Trunk Assessment ?Cervical / Trunk Assessment: Normal ?  ?Communication Communication ?Communication: No difficulties ?  ?Cognition Arousal/Alertness: Awake/alert ?Behavior During Therapy: [facility] for tasks assessed/performed ?Overall Cognitive Status: Within Functional Limits for tasks assessed ?  ?  ?  ?  ?  ?  ?  ?  ?  ?  ?  ?  ?  ?  ?  ?  ?  ?  ?  ?General Comments   VSS on RA ? ?  ?Exercises   ?  ?Shoulder Instructions    ? ? ?  Home Living Family/patient expects to be discharged to:: Private residence ?Living Arrangements: Alone ?Available Help at Discharge: Family;Available PRN/intermittently ?Type of Home: House ?Home Access: Stairs to enter ?Entrance Stairs-Number of Steps: 1 ?Entrance Stairs-Rails: None ?Home Layout: Laundry or work area in basement;Able to live on main level with bedroom/bathroom ?  ?  ?Bathroom Shower/Tub: Tub/shower unit ?  ?Bathroom Toilet: Standard ?Bathroom Accessibility: Yes ?  ?Home Equipment: Advice worker (2 wheels) ?  ?  ?  ? ?  ?Prior Functioning/Environment Prior Level of Function : Independent/Modified Independent;Driving ?  ?  ?  ?  ?  ?  ?  ?ADLs Comments: Continues to be very active, drives, mows her own grass, and relies on no one for ADL/IADL or mobility. ?  ? ?  ?  ?OT Problem List: Pain ?  ?   ?OT Treatment/Interventions:    ?  ?OT Goals(Current goals can be found in the care plan  section) Acute Rehab OT Goals ?Patient Stated Goal: hoping to go home tomorrow ?OT Goal Formulation: With patient ?Time For Goal Achievement: 09/05/21 ?Potential to Achieve Goals: Good  ?OT Frequency:   ?  ? ?Co-evaluation   ?  ?  ?  ?  ? ?  ?AM-PAC OT "6 Clicks" Daily Activity     ?Outcome Measure Help from another person eating meals?: None ?Help from another person taking care of personal grooming?: None ?Help from another person toileting, which includes using toliet, bedpan, or urinal?: None ?Help from another person bathing (including washing, rinsing, drying)?: None ?Help from another person to put on and taking off regular upper body clothing?: None ?Help from another person to put on and taking off regular lower body clothing?: None ?6 Click Score: 24 ?  ?End of Session Nurse Communication: Mobility status ? ?Activity Tolerance: Patient tolerated treatment well ?Patient left: in chair;with call bell/phone within reach ? ?OT Visit Diagnosis: Pain  ?              ?Time: 9562-1308 ?OT Time Calculation (min): 19 min ?Charges:  OT General Charges ?$OT Visit: 1 Visit ?OT Evaluation ?$OT Eval Moderate Complexity: 1 Mod ? ?09/01/2021 ? ?RP, OTR/L ? ?Acute Rehabilitation Services ? ?Office:  [phone] ? ? ?Dawaun Brancato D Teka Chanda ?09/01/2021, 3:30 PM ?

## 2021-09-01 NOTE — Progress Notes (Signed)
CCMD reported pt had a 5 beat run of ventricular trigeminy and saved strip. Walking with PT/OT.  Pt states she has history.  Md notified. ?

## 2021-09-01 NOTE — Assessment & Plan Note (Addendum)
Resolved.  Patient presents with creatinine elevated up to 1.54 with BUN 18 on lab work obtained 3/22.  Home medication regimen includes diuretics.  Baseline creatinine previously had been around 1.  This is greater than a 0.3 increased signifying acute kidney injury.  Orders were placed to start patient on normal saline IV fluids.  Repeat blood work this morning revealed creatinine now within normal limits at 0.72. ?-Continue to monitor ?

## 2021-09-01 NOTE — ED Provider Notes (Signed)
[facility] EMERGENCY DEPT Provider Note   CSN: [id number] Arrival date & time: 08/31/21  2150     History  Chief Complaint  Patient presents with   Head Pain    Megan Hull is a 81 y.o. female.  The history is provided by the patient.  Headache Pain location:  Occipital Quality:  Sharp Radiates to: comes from the neck and into the head. Onset quality:  Sudden Duration: hours, had an episode yesterday associated with tingling in the Right hand and then one today. Timing:  Constant Progression:  Partially resolved Chronicity:  Recurrent Context: not intercourse, not loud noise and not straining   Context comment:  BP being elevated Relieved by: Blood pressure medications. Associated symptoms: neck pain and numbness   Associated symptoms: no abdominal pain, no fever, no neck stiffness, no URI, no visual change and no weakness   Risk factors: no anger       Home Medications Prior to Admission medications   Medication Sig Start Date End Date Taking? Authorizing Provider  albuterol (VENTOLIN HFA) 108 (90 Base) MCG/ACT inhaler Inhale 2 puffs into the lungs every 4 (four) hours as needed for wheezing or shortness of breath. 06/25/20   Becky Augusta, NP  Alum & Mag Hydroxide-Simeth (ANTACID ADVANCED PO) Take 1 tablet by mouth daily as needed (indigestion).     [provider]  aspirin EC 81 MG tablet Take 1 tablet (81 mg total) by mouth daily. Swallow whole. 09/03/20   Ronney Asters, NP  Budeson-Glycopyrrol-Formoterol (BREZTRI AEROSPHERE) 160-9-4.8 MCG/ACT AERO Inhale 2 puffs into the lungs in the morning and at bedtime. 05/24/21   Kozlow, Alvira Philips, MD  budesonide (PULMICORT) 0.25 MG/2ML nebulizer solution Take by nebulization 2 (two) times daily. 12/02/20   [provider]  carvedilol (COREG) 12.5 MG tablet Take 1 tablet (12.5 mg total) by mouth every morning AND 0.5 tablets (6.25 mg total) every evening. 03/08/21   Cleaver, Thomasene Ripple, NP   cetirizine (ZYRTEC) 10 MG tablet Take 1 tablet (10 mg total) by mouth 2 (two) times daily as needed for allergies (Can use an extra dose during flare ups.). 08/12/21   Kozlow, Alvira Philips, MD  Cholecalciferol (VITAMIN D3 PO) Take 50 mg by mouth daily.    [provider]  diphenhydrAMINE (BENADRYL) 25 MG tablet Take 2 tablets by mouth as needed for anaphylaxis.    [provider]  EPINEPHrine 0.3 mg/0.3 mL IJ SOAJ injection Inject 0.3 mLs (0.3 mg total) into the muscle once as needed (anaphylaxis). 12/11/15   Tyrone Nine, MD  famotidine (PEPCID) 40 MG tablet Take 1 tablet (40 mg total) by mouth at bedtime. 05/24/21   Kozlow, Alvira Philips, MD  fluticasone (FLONASE) 50 MCG/ACT nasal spray Place 1 spray into both nostrils daily. 05/24/21   Kozlow, Alvira Philips, MD  Fluticasone Furoate 50 MCG/ACT AEPB Inhale into the lungs.    [provider]  furosemide (LASIX) 40 MG tablet Take 1 tablet (40 mg total) by mouth daily. 08/16/21   Joylene Grapes, NP  hydrALAZINE (APRESOLINE) 25 MG tablet TAKE 1 TABLET THREE TIMES DAILY AS NEEDED FOR SYSTOLIC BLOOD PRESSURE GREATER THAN 150 (NEED MD APPOINTMENT) 12/10/20   Ronney Asters, NP  ipratropium-albuterol (DUONEB) 0.5-2.5 (3) MG/3ML SOLN  03/08/21   [provider]  isosorbide mononitrate (IMDUR) 30 MG 24 hr tablet Take 1 tablet (30 mg total) by mouth daily. 11/17/20   Ronney Asters, NP  mexiletine (MEXITIL) 250 MG capsule Take  1 capsule (250 mg total) by mouth 2 (two) times daily. 08/31/21   Camnitz, Will Daphine Deutscher, MD  montelukast (SINGULAIR) 10 MG tablet Take 10 mg by mouth at bedtime.    [provider]  Multiple Vitamins-Minerals (AIRBORNE PO) Take 1 tablet by mouth daily.    [provider]  omeprazole (PRILOSEC) 40 MG capsule Take 1 capsule (40 mg total) by mouth every morning. 05/24/21   Kozlow, Alvira Philips, MD  Probiotic Product (PROBIOTIC DAILY PO) Take by mouth.    [provider]  simvastatin (ZOCOR) 40 MG tablet TAKE 1  TABLET EVERY DAY 03/25/21   Runell Gess, MD  Spacer/Aero-Holding Chambers (AEROCHAMBER MV) inhaler Use as instructed 06/25/20   Becky Augusta, NP  torsemide (DEMADEX) 20 MG tablet TAKE ONE TABLET BY MOUTH IN THE MORNING AND HALF TABLET AT 2PM 06/24/21   Runell Gess, MD      Allergies    Nexium [esomeprazole magnesium], Other, Codeine, Ace inhibitors, Latex, Monosodium glutamate, Amlodipine, Shellfish allergy, and Tape    Review of Systems   Review of Systems  Constitutional:  Negative for fever.  HENT:  Negative for facial swelling.   Eyes:  Negative for redness and visual disturbance.  Respiratory:  Negative for wheezing and stridor.   Cardiovascular:  Negative for leg swelling.  Gastrointestinal:  Negative for abdominal pain.  Musculoskeletal:  Positive for neck pain. Negative for neck stiffness.  Neurological:  Positive for numbness and headaches. Negative for weakness.  All other systems reviewed and are negative.  Physical Exam Updated Vital Signs BP 122/66   Pulse 70   Temp 98.6 F (37 C)   Resp 18   Ht 5\' 5"  (1.651 m)   Wt 76.2 kg   SpO2 97%   BMI 27.96 kg/m  Physical Exam Vitals and nursing note reviewed.  Constitutional:      General: She is not in acute distress.    Appearance: Normal appearance.  HENT:     Head: Normocephalic and atraumatic.     Nose: Nose normal.     Mouth/Throat:     Mouth: Mucous membranes are moist.     Pharynx: Oropharynx is clear.  Eyes:     Extraocular Movements: Extraocular movements intact.     Conjunctiva/sclera: Conjunctivae normal.     Pupils: Pupils are equal, round, and reactive to light.  Neck:     Vascular: No carotid bruit.  Cardiovascular:     Rate and Rhythm: Normal rate and regular rhythm.     Pulses: Normal pulses.     Heart sounds: Normal heart sounds.  Pulmonary:     Effort: Pulmonary effort is normal.     Breath sounds: Normal breath sounds.  Abdominal:     General: Bowel sounds are normal.      Palpations: Abdomen is soft.     Tenderness: There is no abdominal tenderness. There is no guarding.  Musculoskeletal:        General: Normal range of motion.     Cervical back: Normal range of motion and neck supple. No rigidity.  Skin:    General: Skin is warm and dry.     Capillary Refill: Capillary refill takes less than 2 seconds.  Neurological:     General: No focal deficit present.     Mental Status: She is alert and oriented to person, place, and time.     Cranial Nerves: No cranial nerve deficit.     Deep Tendon Reflexes: Reflexes normal.  Psychiatric:  Mood and Affect: Mood normal.        Behavior: Behavior normal.    ED Results / Procedures / Treatments   Labs (all labs ordered are listed, but only abnormal results are displayed) Results for orders placed or performed during the hospital encounter of 08/31/21  CBC with Differential/Platelet  Result Value Ref Range   WBC 5.6 4.0 - 10.5 K/uL   RBC 4.28 3.87 - 5.11 MIL/uL   Hemoglobin 12.6 12.0 - 15.0 g/dL   HCT 60.6 30.1 - 60.1 %   MCV 89.5 80.0 - 100.0 fL   MCH 29.4 26.0 - 34.0 pg   MCHC 32.9 30.0 - 36.0 g/dL   RDW 09.3 23.5 - 57.3 %   Platelets 226 150 - 400 K/uL   nRBC 0.0 0.0 - 0.2 %   Neutrophils Relative % 48 %   Neutro Abs 2.7 1.7 - 7.7 K/uL   Lymphocytes Relative 41 %   Lymphs Abs 2.3 0.7 - 4.0 K/uL   Monocytes Relative 11 %   Monocytes Absolute 0.6 0.1 - 1.0 K/uL   Eosinophils Relative 0 %   Eosinophils Absolute 0.0 0.0 - 0.5 K/uL   Basophils Relative 0 %   Basophils Absolute 0.0 0.0 - 0.1 K/uL   Immature Granulocytes 0 %   Abs Immature Granulocytes 0.00 0.00 - 0.07 K/uL  Basic metabolic panel  Result Value Ref Range   Sodium 137 135 - 145 mmol/L   Potassium 3.8 3.5 - 5.1 mmol/L   Chloride 103 98 - 111 mmol/L   CO2 25 22 - 32 mmol/L   Glucose, Bld 115 (H) 70 - 99 mg/dL   BUN 18 8 - 23 mg/dL   Creatinine, Ser 2.20 (H) 0.44 - 1.00 mg/dL   Calcium 9.0 8.9 - 25.4 mg/dL   GFR, Estimated 34  (L) >60 mL/min   Anion gap 9 5 - 15   CT Head Wo Contrast  Result Date: 08/31/2021 CLINICAL DATA:  Sudden onset headache EXAM: CT HEAD WITHOUT CONTRAST TECHNIQUE: Contiguous axial images were obtained from the base of the skull through the vertex without intravenous contrast. RADIATION DOSE REDUCTION: This exam was performed according to the departmental dose-optimization program which includes automated exposure control, adjustment of the mA and/or kV according to patient size and/or use of iterative reconstruction technique. COMPARISON:  None. FINDINGS: Brain: There is no mass, hemorrhage or extra-axial collection. The size and configuration of the ventricles and extra-axial CSF spaces are normal. There is hypoattenuation of the white matter, most commonly indicating chronic small vessel disease. Vascular: No abnormal hyperdensity of the major intracranial arteries or dural venous sinuses. No intracranial atherosclerosis. Skull: The visualized skull base, calvarium and extracranial soft tissues are normal. Sinuses/Orbits: Chronic frontal and ethmoid sinusitis. The orbits are normal. IMPRESSION: 1. No acute intracranial abnormality. 2. Chronic small vessel disease. 3. Chronic frontal and ethmoid sinusitis. Electronically Signed   By: Deatra Robinson M.D.   On: 08/31/2021 22:56     Radiology CT Head Wo Contrast  Result Date: 08/31/2021 CLINICAL DATA:  Sudden onset headache EXAM: CT HEAD WITHOUT CONTRAST TECHNIQUE: Contiguous axial images were obtained from the base of the skull through the vertex without intravenous contrast. RADIATION DOSE REDUCTION: This exam was performed according to the departmental dose-optimization program which includes automated exposure control, adjustment of the mA and/or kV according to patient size and/or use of iterative reconstruction technique. COMPARISON:  None. FINDINGS: Brain: There is no mass, hemorrhage or extra-axial collection. The size and configuration  of the  ventricles and extra-axial CSF spaces are normal. There is hypoattenuation of the white matter, most commonly indicating chronic small vessel disease. Vascular: No abnormal hyperdensity of the major intracranial arteries or dural venous sinuses. No intracranial atherosclerosis. Skull: The visualized skull base, calvarium and extracranial soft tissues are normal. Sinuses/Orbits: Chronic frontal and ethmoid sinusitis. The orbits are normal. IMPRESSION: 1. No acute intracranial abnormality. 2. Chronic small vessel disease. 3. Chronic frontal and ethmoid sinusitis. Electronically Signed   By: Deatra Robinson M.D.   On: 08/31/2021 22:56    Procedures Procedures    Medications Ordered in ED Medications - No data to display  ED Course/ Medical Decision Making/ A&P                           Medical Decision Making Neck and occipital head pain with numbness I nthe right hand x 2   Amount and/or Complexity of Data Reviewed External Data Reviewed: notes.    Details: previous notes reviewed Labs: ordered.    Details: all labs reviewed by me:  Normal CBC, white count and hemoglobin are normal; normal sodium and potassium on chemistry panel creatinin slightly elevated at 1.54 Radiology: ordered and independent interpretation performed.    Details: CT of head by me without bleed or acute stroke Discussion of management or test interpretation with external provider(s): Case d/w Dr. Otelia Limes based on ABCDE score admit for TIA work up   Risk OTC drugs. Decision regarding hospitalization. Risk Details: Will need inpatient TIA work up.      Final Clinical Impression(s) / ED Diagnoses Final diagnoses:  TIA (transient ischemic attack)   The patient appears reasonably stabilized for admission considering the current resources, flow, and capabilities available in the ED at this time, and I doubt any other [facility] requiring further screening and/or treatment in the ED prior to admission.  Rx / DC Orders ED  Discharge Orders     None         Aireona Torelli, MD 09/01/21 9147

## 2021-09-01 NOTE — Progress Notes (Addendum)
SLP Cancellation Note ? ?Patient Details ?Name: Megan Hull ?MRN: [id number] ?DOB: July 29, 1940 ? ? ?Cancelled treatment:       Reason Eval/Treat Not Completed: SLP screened, no needs identified, will sign off Pt did not present with any acute speech, language, or cognitive-linguistic deficits on admission, MRI was negative for acute changes, pt denied any changes in these areas, and no overt communication or cognitive-linguistic deficits were noted by occupational therapy. A formal evaluation does not appear to be clinically indicated at this time. SLP will sign off. ? ?Shawntelle Ungar I. Hardin Negus, [place], CCC-SLP ?Acute Rehabilitation Services ?Office number [phone] ?Pager [phone] ? ?[name] ?09/01/2021, 3:55 PM ?

## 2021-09-01 NOTE — Assessment & Plan Note (Signed)
Repeat labs this morning had noted that her hemoglobin was down to 61.  She is not on any diabetic medications.  Patient reported last hemoglobin A1c was 6.6. ?-Follow-up hemoglobin A1c ?

## 2021-09-02 ENCOUNTER — Observation Stay (HOSPITAL_COMMUNITY): Payer: Medicare HMO

## 2021-09-02 DIAGNOSIS — E119 Type 2 diabetes mellitus without complications: Secondary | ICD-10-CM | POA: Diagnosis not present

## 2021-09-02 DIAGNOSIS — N179 Acute kidney failure, unspecified: Secondary | ICD-10-CM | POA: Diagnosis not present

## 2021-09-02 DIAGNOSIS — M5481 Occipital neuralgia: Secondary | ICD-10-CM | POA: Diagnosis not present

## 2021-09-02 DIAGNOSIS — Z8673 Personal history of transient ischemic attack (TIA), and cerebral infarction without residual deficits: Secondary | ICD-10-CM | POA: Diagnosis not present

## 2021-09-02 DIAGNOSIS — N189 Chronic kidney disease, unspecified: Secondary | ICD-10-CM | POA: Diagnosis not present

## 2021-09-02 DIAGNOSIS — I6523 Occlusion and stenosis of bilateral carotid arteries: Secondary | ICD-10-CM | POA: Diagnosis not present

## 2021-09-02 DIAGNOSIS — M5412 Radiculopathy, cervical region: Secondary | ICD-10-CM

## 2021-09-02 DIAGNOSIS — E785 Hyperlipidemia, unspecified: Secondary | ICD-10-CM | POA: Diagnosis not present

## 2021-09-02 DIAGNOSIS — M50121 Cervical disc disorder at C4-C5 level with radiculopathy: Secondary | ICD-10-CM | POA: Diagnosis not present

## 2021-09-02 LAB — BASIC METABOLIC PANEL
Anion gap: 5 (ref 5–15)
BUN: 15 mg/dL (ref 8–23)
CO2: 28 mmol/L (ref 22–32)
Calcium: 9 mg/dL (ref 8.9–10.3)
Chloride: 106 mmol/L (ref 98–111)
Creatinine, Ser: 1.1 mg/dL — ABNORMAL HIGH (ref 0.44–1.00)
GFR, Estimated: 50 mL/min — ABNORMAL LOW (ref >60)
Glucose, Bld: 105 mg/dL — ABNORMAL HIGH (ref 70–99)
Potassium: 4.9 mmol/L (ref 3.5–5.1)
Sodium: 139 mmol/L (ref 135–145)

## 2021-09-02 LAB — MAGNESIUM: Magnesium: 2.1 mg/dL (ref 1.7–2.4)

## 2021-09-02 LAB — LIPID PANEL
Cholesterol: 116 mg/dL (ref 0–200)
HDL: 41 mg/dL (ref >40)
LDL Cholesterol: 63 mg/dL (ref 0–99)
Total CHOL/HDL Ratio: 2.8 RATIO
Triglycerides: 61 mg/dL (ref <150)
VLDL: 12 mg/dL (ref 0–40)

## 2021-09-02 LAB — HEMOGLOBIN A1C
Hgb A1c MFr Bld: 6.4 % — ABNORMAL HIGH (ref 4.8–5.6)
Mean Plasma Glucose: 136.98 mg/dL

## 2021-09-02 MED ORDER — IOHEXOL 350 MG/ML SOLN
100.0000 mL | Freq: Once | INTRAVENOUS | Status: AC | PRN
  Administered 2021-09-02: 75 mL via INTRAVENOUS

## 2021-09-02 MED ORDER — OYSTER SHELL CALCIUM/D3 500-5 MG-MCG PO TABS: 1.0000 | ORAL_TABLET | Freq: Every day | ORAL | Status: DC

## 2021-09-02 MED ORDER — MEXILETINE HCL 250 MG PO CAPS
250.0000 mg | ORAL_CAPSULE | Freq: Two times a day (BID) | ORAL | Status: DC
  Filled 2021-09-02 (×2): qty 1

## 2021-09-02 MED ORDER — ASPIRIN EC 81 MG PO TBEC: 81.0000 mg | DELAYED_RELEASE_TABLET | ORAL | Status: DC

## 2021-09-02 NOTE — Progress Notes (Incomplete)
? ? ? [facility] ?                                                                            ? ? ?Megan Hull, is a 81 y.o. female, DOB - August 13, 1940, SEG:[id number] ?Admit date - 08/31/2021    ?Outpatient Primary MD for the patient is [name], DO ? ?LOS - 0  days ? ? ? ?Brief summary  ? ?No notes on file ? ? ?Assessment & Plan  ? ? ?Assessment and Plan: ?* TIA (transient ischemic attack) ?Acute.  Patient presented with complaints of a posterior headache and right-sided pins/needles sensation in her head and neck quickly resolved.  CT scan of the brain without contrast did not note any acute signs of stroke.  She is not a candidate for tPA due to symptoms resolving.  Case had been discussed with Dr. Cheral Marker who recommended admission for completion of TIA work-up. ?Stroke work up has been negative.  ?CTA of the head and neck does not show any large vessel occlusion.  ?MRI brain is negative for stroke.  ?MRI cervical spine ordered for further eval.  ?Therapy eval no follow up .  ?Echocardiogram from 1//23 unremarkable.  ?Ldl is less than 70. On zocor at home at 40 mg daily.  ?Hemoglobin A1c is 6.4% ?Continue with aspirin 81 mg daily.  ?Neurology on board and awaiting recommendations.  ? ? ? ?Hypocalcemia ?Improved with supplementation.  ? ? ?Hypokalemia ?Replace as needed.  ? ?Controlled type 2 diabetes mellitus without complication, without long-term current use of insulin ([place]) ?CBG (last 3)  ?Recent Labs  ?  09/01/21 ?1144  ?GLUCAP 113*  ? ?A1c is 6.4% ? ? ?Acute kidney injury superimposed on chronic kidney disease ([place]) ?Resolved.  Patient presents with creatinine elevated up to 1.54 with BUN 18 on lab work obtained 3/22.   ?Baseline creatinine around 1. Currently around 1.1. ?Continue to monitor.  ? ?Dysrhythmia ?Heart rates appear to range from 44-97. Patient had been just been evaluated by Dr. Curt Bears electrophysiology yesterday after wearing heart monitor 07/10/2021. Monitor noted sinus  rhythm, sinus bradycardia, sinus tachycardia, runs of SVT, and frequent PVCs (20%).  Patient did not want to undergo ablation at that time and is on  mexiletine 250 mg twice daily.   ?-Follow-up telemetry ? ?Dyslipidemia ?Home medication regimen includes simvastatin 40 mg daily. ?-Follow-up lipid panel ?-Continue simvastatin 40 mg daily.  ?-Goal LDL less than 70.   ? ?Essential hypertension ?Bp parameters are optimal.  ?At home she is on coreg 12.5 mg BID, lasix 40 mg daily, Imdur 30 mg daily. Prn hydralazine which have been on hold for now.  ? ?  ? ?Nutrition Interventions: ? ?  ? ?Estimated body mass index is 27.96 kg/m? as calculated from the following: ?  Height as of this encounter: '5\' 5"'$  (1.651 m). ?  Weight as of this encounter: 76.2 kg. ? ?Code Status: full code.  ?DVT Prophylaxis:  enoxaparin (LOVENOX) injection 40 mg Start: 09/02/21 1000 ? ? ?Level of Care: Level of care: Telemetry Medical ?Family Communication: none at bedside ?Disposition Plan:     pending MRI cervical spine and neurology recommendations.  ? ?Procedures:  ?CTA head and  neck.  ?MRI brain.  ?MRI cervical spine.  ? ?Consultants:   ?neurology ? ?Antimicrobials:  ? ?Anti-infectives (From admission, onward)  ? ? None  ? ?  ? ? ? ?Medications ? ?Scheduled Meds: ? aspirin EC  81 mg Oral Q24H  ? calcium-vitamin D  1 tablet Oral Q breakfast  ? enoxaparin (LOVENOX) injection  40 mg Subcutaneous Q24H  ? fluticasone  1 spray Each Nare Daily  ? loratadine  10 mg Oral Daily  ? mometasone-formoterol  2 puff Inhalation BID  ? And  ? umeclidinium bromide  1 puff Inhalation Daily  ? montelukast  10 mg Oral QHS  ? pantoprazole  40 mg Oral Daily  ? simvastatin  40 mg Oral q1800  ? ?Continuous Infusions: ?PRN Meds:.acetaminophen **OR** acetaminophen (TYLENOL) oral liquid 160 mg/5 mL **OR** acetaminophen, budesonide ? ? ? ?Subjective:  ? ?Megan Hull was seen and examined today.  Tingling and numbness of the upper extremity.  ? ?Objective:  ? ?Vitals:   ? 09/01/21 2037 09/01/21 2048 09/01/21 2333 09/02/21 0322  ?BP: (!) 151/118  112/64 113/68  ?Pulse: 65  (!) 48 65  ?Resp: '17  15 17  '$ ?Temp:   97.6 ?F (36.4 ?C) 97.9 ?F (36.6 ?C)  ?TempSrc:   Oral Oral  ?SpO2: 99% 99% 95% 96%  ?Weight:      ?Height:      ? ? ?Intake/Output Summary (Last 24 hours) at 09/02/2021 1023 ?Last data filed at 09/01/2021 1830 ?Gross per 24 hour  ?Intake 540 ml  ?Output --  ?Net 540 ml  ? ?Filed Weights  ? 08/31/21 2201  ?Weight: 76.2 kg  ? ? ? ?Exam ?General exam: Appears calm and comfortable  ?Respiratory system: Clear to auscultation. Respiratory effort normal. ?Cardiovascular system: S1 & S2 heard, RRR. No JVD,  No pedal edema. ?Gastrointestinal system: Abdomen is nondistended, soft and nontender. Normal bowel sounds heard. ?Central nervous system: Alert and oriented. No focal neurological deficits. ?Extremities: Symmetric 5 x 5 power. ?Skin: No rashes, lesions or ulcers ?Psychiatry:  Mood & affect appropriate.  ? ? ? ?Data Reviewed:  I have personally reviewed following labs and imaging studies ? ? ?CBC ?Lab Results  ?Component Value Date  ? WBC 4.6 09/01/2021  ? RBC 4.50 09/01/2021  ? HGB 13.1 09/01/2021  ? HCT 40.6 09/01/2021  ? MCV 90.2 09/01/2021  ? MCH 29.1 09/01/2021  ? PLT 216 09/01/2021  ? MCHC 32.3 09/01/2021  ? RDW 13.5 09/01/2021  ? LYMPHSABS 2.3 08/31/2021  ? MONOABS 0.6 08/31/2021  ? EOSABS 0.0 08/31/2021  ? BASOSABS 0.0 08/31/2021  ? ? ? ?Last metabolic panel ?Lab Results  ?Component Value Date  ? NA 139 09/02/2021  ? K 4.9 09/02/2021  ? CL 106 09/02/2021  ? CO2 28 09/02/2021  ? BUN 15 09/02/2021  ? CREATININE 1.10 (H) 09/02/2021  ? GLUCOSE 105 (H) 09/02/2021  ? GFRNONAA 50 (L) 09/02/2021  ? GFRAA >60 03/02/2020  ? CALCIUM 9.0 09/02/2021  ? PHOS 2.9 01/25/2016  ? PROT 4.7 (L) 09/01/2021  ? ALBUMIN 2.7 (L) 09/01/2021  ? BILITOT 0.5 09/01/2021  ? ALKPHOS 46 09/01/2021  ? AST 12 (L) 09/01/2021  ? ALT 7 09/01/2021  ? ANIONGAP 5 09/02/2021  ? ? ?CBG (last 3)  ?Recent Labs  ?   09/01/21 ?1144  ?GLUCAP 113*  ?  ? ? ?Coagulation Profile: ?No results for input(s): INR, PROTIME in the last 168 hours. ? ? ?Radiology Studies: ?CT ANGIO HEAD NECK W WO CM ? ?  Result Date: 09/02/2021 ?CLINICAL DATA:  Stroke follow-up.  TIA. EXAM: CT ANGIOGRAPHY HEAD AND NECK TECHNIQUE: Multidetector CT imaging of the head and neck was performed using the standard protocol during bolus administration of intravenous contrast. Multiplanar CT image reconstructions and MIPs were obtained to evaluate the vascular anatomy. Carotid stenosis measurements (when applicable) are obtained utilizing NASCET criteria, using the distal internal carotid diameter as the denominator. RADIATION DOSE REDUCTION: This exam was performed according to the departmental dose-optimization program which includes automated exposure control, adjustment of the mA and/or kV according to patient size and/or use of iterative reconstruction technique. CONTRAST:  51m OMNIPAQUE IOHEXOL 350 MG/ML SOLN COMPARISON:  None. FINDINGS: CT HEAD FINDINGS Brain: There is no mass, hemorrhage or extra-axial collection. The size and configuration of the ventricles and extra-axial CSF spaces are normal. There is no acute or chronic infarction. The brain parenchyma is normal. Skull: The visualized skull base, calvarium and extracranial soft tissues are normal. Sinuses/Orbits: No fluid levels or advanced mucosal thickening of the visualized paranasal sinuses. No mastoid or middle ear effusion. The orbits are normal. CTA NECK FINDINGS SKELETON: There is no bony spinal canal stenosis. No lytic or blastic lesion. OTHER NECK: Normal pharynx, larynx and major salivary glands. No cervical lymphadenopathy. Unremarkable thyroid gland. UPPER CHEST: No pneumothorax or pleural effusion. No nodules or masses. AORTIC ARCH: There is calcific atherosclerosis of the aortic arch. There is no aneurysm, dissection or hemodynamically significant stenosis of the visualized portion of the  aorta. Conventional 3 vessel aortic branching pattern. The visualized proximal subclavian arteries are widely patent. RIGHT CAROTID SYSTEM: No dissection, occlusion or aneurysm. Mild atherosclerotic calcificat

## 2021-09-02 NOTE — TOC Transition Note (Signed)
Transition of Care (TOC) - CM/SW Discharge Note ? ? ?Patient Details  ?Name: Megan Hull ?MRN: [id number] ?Date of Birth: 1941/04/13 ? ?Transition of Care (TOC) CM/SW Contact:  ?Tom-Johnson, Renea Ee, RN ?Phone Number: ?09/02/2021, 12:44 PM ? ? ?Clinical Narrative:    ? ?Patient is scheduled for discharge today. No TOC recommendations or needs noted. Family to transport at discharge. No further TOC needs noted. ? ?  ?  ? ? ?Patient Goals and CMS Choice ?  ?  ?  ? ?Discharge Placement ?  ?           ?  ?  ?  ?  ? ?Discharge Plan and Services ?  ?  ?           ?  ?  ?  ?  ?  ?  ?  ?  ?  ?  ? ?Social Determinants of Health (SDOH) Interventions ?  ? ? ?Readmission Risk Interventions ?   ? View : No data to display.  ?  ?  ?  ? ? ? ? ? ?

## 2021-09-02 NOTE — Progress Notes (Addendum)
STROKE TEAM PROGRESS NOTE  ? ?ATTENDING NOTE: ?I reviewed above note and agree with the assessment and plan. Pt was seen and examined.  ? ?81 year old female with history of hypertension, hyperlipidemia, diabetes, CHF, SVT, status post C5-7 ACDF admitted for recurrent left sided occipital neuralgia with shooting pain and one-time right hand tingling for 3 minutes.  CT, MRI, CTA head and neck unremarkable, no acute stroke.  MRI C-spine showed C7-T1 biforaminal stenosis, C8 impingement greater on the right.  EF 60 to 65% in 06/2021.  LDL 63, A1c 6.4.  Creatinine 1.54-> 0.72. ? ?On exam, no family at bedside, patient sitting in chair, patient still have slight left occipital ridge tenderness on touch but no frank occipital neuralgia at this time.  Otherwise neuro intact. ? ?Patient symptoms likely due to left occipital neuralgia with cervical radiculopathy.  Currently symptoms largely resolved, no occipital nerve block needed.  Recommend follow-up with Dr. Jaynee Eagles per neurologist at [facility] for further management.  Continue home aspirin 81 and Zocor 40. ? ?For detailed assessment and plan, please refer to below as I have made changes wherever appropriate.  ? ?Neurology will sign off. Please call with questions. Pt will follow up with Dr. Jaynee Eagles at [facility] in about 4 weeks. Thanks for the consult. ? ?Rosalin Hawking, MD PhD ?Stroke Neurology ?09/02/2021 ?5:42 PM ? ? ? ?INTERVAL HISTORY ?No family at bedside.  Patient was awake, alert, sitting up in bed comfortable. ?08/30/2021 pm she first noted left neck sharp pain that radiates to the left side of her head, while she was at rest.  She then took a Tylenol and went to bed.  Stated that the pain resolved the next morning. ?08/31/2021 pm she noted the same left-sided sharp pain in the rate of the left side of her head, and she also had right hand "pins-and-needles sensation".  Reported this lasted about 20-30 minutes.  Prior to 3/21, she noted very fleeting numbness and tingling in her right  hand.  ?Discussed with patient that her symptoms are consistent with occipital neuralgia and nerve impingement given her history of neck surgery and MR c spine showed C8 impingement (R<L). Otherwise patient was instructed to take breaks while working, and if pain arises, to lay down to relieve compression and as needed Tylenol. Discussed with patient treatment options for if pain is worsening, follow up with Dr. 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  ?Currently patient denies weakness, paresthesia, headache, dizziness. ?Patient had no further concerns. ? ?Vitals:  ? 09/01/21 2048 09/01/21 2333 09/02/21 0322 09/02/21 1107  ?BP:  112/64 113/68 127/66  ?Pulse:  (!) 48 65 73  ?Resp:  '15 17 15  '$ ?Temp:  97.6 ?F (36.4 ?C) 97.9 ?F (36.6 ?C) (!) 97.5 ?F (36.4 ?C)  ?TempSrc:  Oral Oral Oral  ?SpO2: 99% 95% 96% 96%  ?Weight:      ?Height:      ? ?CBC:  ?Recent Labs  ?Lab 08/31/21 ?2308 09/01/21 ?0840  ?WBC 5.6 4.6  ?NEUTROABS 2.7  --   ?HGB 12.6 13.1  ?HCT 38.3 40.6  ?MCV 89.5 90.2  ?PLT 226 216  ? ?Basic Metabolic Panel:  ?Recent Labs  ?Lab 09/01/21 ?5726 09/02/21 ?0216  ?NA 144 139  ?K 2.6* 4.9  ?CL 117* 106  ?CO2 20* 28  ?GLUCOSE 61* 105*  ?BUN 13 15  ?CREATININE 0.72 1.10*  ?CALCIUM 6.1* 9.0  ?MG  --  2.1  ? ?Lipid Panel:  ?Recent Labs  ?  Lab 09/02/21 ?0216  ?CHOL 116  ?TRIG 61  ?HDL 41  ?CHOLHDL 2.8  ?VLDL 12  ?[place] 63  ? ?HgbA1c:  ?Recent Labs  ?Lab 09/02/21 ?0216  ?HGBA1C 6.4*  ? ?Urine Drug Screen:  ?No results for input(s): LABOPIA, COCAINSCRNUR, LABBENZ, AMPHETMU, THCU, LABBARB in the last 168 hours. ?Alcohol Level No results for input(s): ETH in the last 168 hours. ? ?IMAGING past 24 hours ?CT ANGIO HEAD NECK W WO CM ? ?Result Date: 09/02/2021 ?CLINICAL DATA:  Stroke follow-up.  TIA. EXAM: CT ANGIOGRAPHY HEAD AND NECK TECHNIQUE: Multidetector CT imaging of the head and neck was performed using the standard protocol during bolus administration of intravenous contrast. Multiplanar CT  image reconstructions and MIPs were obtained to evaluate the vascular anatomy. Carotid stenosis measurements (when applicable) are obtained utilizing NASCET criteria, using the distal internal carotid diameter as the denominator. RADIATION DOSE REDUCTION: This exam was performed according to the departmental dose-optimization program which includes automated exposure control, adjustment of the mA and/or kV according to patient size and/or use of iterative reconstruction technique. CONTRAST:  73m OMNIPAQUE IOHEXOL 350 MG/ML SOLN COMPARISON:  None. FINDINGS: CT HEAD FINDINGS Brain: There is no mass, hemorrhage or extra-axial collection. The size and configuration of the ventricles and extra-axial CSF spaces are normal. There is no acute or chronic infarction. The brain parenchyma is normal. Skull: The visualized skull base, calvarium and extracranial soft tissues are normal. Sinuses/Orbits: No fluid levels or advanced mucosal thickening of the visualized paranasal sinuses. No mastoid or middle ear effusion. The orbits are normal. CTA NECK FINDINGS SKELETON: There is no bony spinal canal stenosis. No lytic or blastic lesion. OTHER NECK: Normal pharynx, larynx and major salivary glands. No cervical lymphadenopathy. Unremarkable thyroid gland. UPPER CHEST: No pneumothorax or pleural effusion. No nodules or masses. AORTIC ARCH: There is calcific atherosclerosis of the aortic arch. There is no aneurysm, dissection or hemodynamically significant stenosis of the visualized portion of the aorta. Conventional 3 vessel aortic branching pattern. The visualized proximal subclavian arteries are widely patent. RIGHT CAROTID SYSTEM: No dissection, occlusion or aneurysm. Mild atherosclerotic calcification at the carotid bifurcation without hemodynamically significant stenosis. LEFT CAROTID SYSTEM: No dissection, occlusion or aneurysm. Mild atherosclerotic calcification at the carotid bifurcation without hemodynamically significant  stenosis. VERTEBRAL ARTERIES: Left dominant configuration. Both origins are clearly patent. There is no dissection, occlusion or flow-limiting stenosis to the skull base (V1-V3 segments). CTA HEAD FINDINGS POSTERIOR CIRCULATION: --Vertebral arteries: Normal V4 segments. --Inferior cerebellar arteries: Normal. --Basilar artery: Normal. --Superior cerebellar arteries: Normal. --Posterior cerebral arteries (PCA): Normal. ANTERIOR CIRCULATION: --Intracranial internal carotid arteries: Normal. --Anterior cerebral arteries (ACA): Normal. Both A1 segments are present. Patent anterior communicating artery (a-comm). --Middle cerebral arteries (MCA): Normal. VENOUS SINUSES: As permitted by contrast timing, patent. ANATOMIC VARIANTS: None Review of the MIP images confirms the above findings. IMPRESSION: 1. No emergent large vessel occlusion or hemodynamically significant stenosis of the head or neck. 2. Mild bilateral carotid bifurcation atherosclerosis without hemodynamically significant stenosis. Aortic Atherosclerosis (ICD10-I70.0). Electronically Signed   By: KUlyses JarredM.D.   On: 09/02/2021 00:14  ? ?MR CERVICAL SPINE WO CONTRAST ? ?Result Date: 09/02/2021 ?CLINICAL DATA:  Cervical radiculopathy. EXAM: MRI CERVICAL SPINE WITHOUT CONTRAST TECHNIQUE: Multiplanar, multisequence MR imaging of the cervical spine was performed. No intravenous contrast was administered. COMPARISON:  12/22/2014 FINDINGS: Alignment: Degenerative anterolisthesis at C7-T1 measuring 2 mm. Vertebrae: No fracture, evidence of discitis, or bone lesion. Solid arthrodesis at C5-6 and C6-7. Cord: Normal signal and morphology. Posterior  Fossa, vertebral arteries, paraspinal tissues: Negative for perispinal mass or inflammation. Disc levels: C2-3: Mild facet spurring.  No impingement C3-4: Mild facet spurring and left uncovertebral ridging. No neural impingement C4-5: Mild disc bulging and facet spurring. Mild bilateral foraminal narrowing C5-6: ACDF with  solid arthrodesis and no impingement C6-7: ACDF with solid arthrodesis and no impingement C7-T1:Degeneration primarily affecting the facets with spurring and anterolisthesis. Biforaminal stenosis with

## 2021-09-02 NOTE — Care Management Obs Status (Signed)
MEDICARE OBSERVATION STATUS NOTIFICATION ? ? ?Patient Details  ?Name: Megan Hull ?MRN: [id number] ?Date of Birth: 1940/09/30 ? ? ?Medicare Observation Status Notification Given:  Yes ? ? ? ?Tom-Johnson, Renea Ee, RN ?09/02/2021, 12:42 PM ?

## 2021-09-04 NOTE — Discharge Summary (Signed)
?Physician Discharge Summary ?  ?Patient: Megan Hull MRN: [id number] DOB: [birth year]  ?Admit date:     08/31/2021  ?Discharge date: 09/02/2021  ?Discharge Physician: Hosie Poisson  ? ?PCP: [name], DO  ? ?Recommendations at discharge:  ?Recommend follow-up with Dr. Jaynee Eagles per neurologist at [facility] for further management.  ?Recommend outpatient follow up with PCP in one week.  ? ?Discharge Diagnoses: ?Principal Problem: ?  TIA (transient ischemic attack) ?Active Problems: ?  Essential hypertension ?  Dyslipidemia ?  Dysrhythmia ?  Acute kidney injury superimposed on chronic kidney disease ([place]) ?  Controlled type 2 diabetes mellitus without complication, without long-term current use of insulin ([place]) ?  Hypokalemia ?  Hypocalcemia ? ? ? ?Hospital Course: ? ?Megan Hull is a 81 y.o. female with medical history significant of hypertension, hyperlipidemia, CAD, diabetes mellitus type 2, aarrhythmia, nonrheumatic aortic valve stenosis, bilateral carotid artery stenosis, persistent asthma, and tobacco abuse who presented with complaints of headache started after she ate dinner between 8:30-9 PM last night. Her symptoms probably due to left occipital neuralgia with cervical radiculopathy.  ? ?Assessment and Plan: ?* TIA (transient ischemic attack) ?Acute.  Patient presented with complaints of a posterior headache and right-sided pins/needles sensation in her head and neck quickly resolved.  CT scan of the brain without contrast did not note any acute signs of stroke.  She is not a candidate for tPA due to symptoms resolving.  Case had been discussed with Dr. Cheral Marker who recommended admission for completion of TIA work-up. ? So far work up is negative.  ?Neurology recommended aspirin and follow up with Dr Jaynee Eagles.  ? ? ? ?Hypocalcemia ?Improved .  ? ? ?Hypokalemia ?Replaced.  ? ?Controlled type 2 diabetes mellitus without complication, without long-term current use of insulin ([place]) ?CBG (last 3)  ?Resume home  meds.  ? ? ?Acute kidney injury superimposed on chronic kidney disease ([place]) ?Resolved.  ? ?Dysrhythmia ?Heart rates appear to range from 44-97. Patient had been just been evaluated by Dr. Curt Bears electrophysiology yesterday after wearing heart monitor 07/10/2021. Monitor noted sinus rhythm, sinus bradycardia, sinus tachycardia, runs of SVT, and frequent PVCs (20%).  Patient did not want to undergo ablation at that time and mexiletine 250 mg twice daily.   ?She was on coreg and mexiletine,  ?She does not want to be on either of them at this time.  ?She wants to talk to Dr Curt Bears office and get PA for the mexiletine before starting it.  ? ? ?Dyslipidemia ?Home medication regimen includes simvastatin 40 mg daily. ? ? ?Essential hypertension ?Optimally controlled.  ?- ? ? ? ? ?  ? ?Consultants: neurology.  ?Procedures performed: MRI BRAIN.   ?Disposition: Home ?Diet recommendation:  ?Discharge Diet Orders (From admission, onward)  ? ?  Start     Ordered  ? 09/02/21 0000  Diet - low sodium heart healthy       ? 09/02/21 1159  ? ?  ?  ? ?  ? ?Cardiac diet ?DISCHARGE MEDICATION: ?Allergies as of 09/02/2021   ? ?   Reactions  ? Nexium [esomeprazole Magnesium] Anaphylaxis  ? Pt states she was unable to swallow for several hours after taking the nexium  ? Other Hives, Shortness Of Breath, Other (See Comments)  ? Red Fish (Hives) ?Bojangle's chicken (swelling and shortness of breath) PAPRIKA  ? Codeine Other (See Comments)  ? REACTION: chest pain  ? Ace Inhibitors Swelling  ? Angioedema  ? Latex Other (See Comments)  ?  Causes skin to turn red per patient   ? Monosodium Glutamate Other (See Comments)  ? dizziness  ? Amlodipine Other (See Comments)  ? Lower extremity swelling, itching  ? Shellfish Allergy Hives  ? Tape Rash  ? Coban wrap turned her arm red (after stress test)  ? ?  ? ?  ?Medication List  ?  ? ?STOP taking these medications   ? ?carvedilol 12.5 MG tablet ?Commonly known as: COREG ?  ?VITAMIN D3 PO ?  ? ?  ? ?TAKE  these medications   ? ?AeroChamber MV inhaler ?Use as instructed ?  ?AIRBORNE PO ?Take 1 tablet by mouth daily. ?  ?albuterol 108 (90 Base) MCG/ACT inhaler ?Commonly known as: VENTOLIN HFA ?Inhale 2 puffs into the lungs every 4 (four) hours as needed for wheezing or shortness of breath. ?  ?ANTACID ADVANCED PO ?Take 1 tablet by mouth daily as needed (indigestion). ?  ?aspirin EC 81 MG tablet ?Take 1 tablet (81 mg total) by mouth daily. Swallow whole. ?  ?Breztri Aerosphere 160-9-4.8 MCG/ACT Aero ?Generic drug: Budeson-Glycopyrrol-Formoterol ?Inhale 2 puffs into the lungs in the morning and at bedtime. ?  ?budesonide 0.25 MG/2ML nebulizer solution ?Commonly known as: PULMICORT ?Take 0.25 mg by nebulization 2 (two) times daily as needed (shortness of breath, wheezing). ?  ?calcium-vitamin D 500-5 MG-MCG tablet ?Commonly known as: OSCAL WITH D ?Take 1 tablet by mouth daily with breakfast. ?  ?cetirizine 10 MG tablet ?Commonly known as: ZYRTEC ?Take 1 tablet (10 mg total) by mouth 2 (two) times daily as needed for allergies (Can use an extra dose during flare ups.). ?  ?diphenhydrAMINE 25 MG tablet ?Commonly known as: BENADRYL ?Take 2 tablets by mouth as needed for anaphylaxis. ?  ?EPINEPHrine 0.3 mg/0.3 mL Soaj injection ?Commonly known as: EPI-PEN ?Inject 0.3 mLs (0.3 mg total) into the muscle once as needed (anaphylaxis). ?  ?famotidine 40 MG tablet ?Commonly known as: PEPCID ?Take 1 tablet (40 mg total) by mouth at bedtime. ?  ?fluticasone 50 MCG/ACT nasal spray ?Commonly known as: Flonase ?Place 1 spray into both nostrils daily. ?  ?furosemide 40 MG tablet ?Commonly known as: Lasix ?Take 1 tablet (40 mg total) by mouth daily. ?  ?hydrALAZINE 25 MG tablet ?Commonly known as: APRESOLINE ?TAKE 1 TABLET THREE TIMES DAILY AS NEEDED FOR SYSTOLIC BLOOD PRESSURE GREATER THAN 150 (NEED MD APPOINTMENT) ?What changed:  ?how much to take ?how to take this ?when to take this ?reasons to take this ?additional instructions ?   ?ipratropium-albuterol 0.5-2.5 (3) MG/3ML Soln ?Commonly known as: DUONEB ?Take 3 mLs by nebulization every 6 (six) hours as needed (wheezing, shortness of breath). ?  ?isosorbide mononitrate 30 MG 24 hr tablet ?Commonly known as: IMDUR ?Take 1 tablet (30 mg total) by mouth daily. ?  ?mexiletine 250 MG capsule ?Commonly known as: MEXITIL ?Take 1 capsule (250 mg total) by mouth 2 (two) times daily. ?  ?montelukast 10 MG tablet ?Commonly known as: SINGULAIR ?Take 10 mg by mouth at bedtime. ?  ?omeprazole 40 MG capsule ?Commonly known as: PRILOSEC ?Take 1 capsule (40 mg total) by mouth every morning. ?  ?PROBIOTIC DAILY PO ?Take 1 capsule by mouth daily. ?  ?simvastatin 40 MG tablet ?Commonly known as: ZOCOR ?TAKE 1 TABLET EVERY DAY ?What changed: when to take this ?  ? ?  ? ? Follow-up Information   ? ? [name], DO. Schedule an appointment as soon as possible for a visit in 1 week(s).   ?Specialty:  Family Medicine ?Contact information: ?[address] ?[address] ?[place] [address] ?[phone] ? ? ?  ?  ? ? Lorretta Harp, MD .   ?Specialties: Cardiology, Radiology ?Contact information: ?[address] ?[address] ?[place] [address] ?[phone] ? ? ?  ?  ? ? Melvenia Beam, MD. Schedule an appointment as soon as possible for a visit in 1 month(s).   ?Specialty: Neurology ?Contact information: ?[address] ?[address] ?[place] [address] ?[phone] ? ? ?  ?  ? ?  ?  ? ?  ? ?Discharge Exam: ?Filed Weights  ? 08/31/21 2201  ?Weight: 76.2 kg  ? ?General exam: Appears calm and comfortable  ?Respiratory system: Clear to auscultation. Respiratory effort normal. ?Cardiovascular system: S1 & S2 heard, RRR. No JVD, murmurs, rubs, gallops or clicks. No pedal edema. ?Gastrointestinal system: Abdomen is nondistended, soft and nontender. No organomegaly or masses felt. Normal bowel sounds heard. ?Central nervous system: Alert and oriented. No focal neurological deficits. ?Extremities: Symmetric 5 x 5  power. ?Skin: No rashes, lesions or ulcers ?Psychiatry: Judgement and insight appear normal. Mood & affect appropriate.  ? ? ?Condition at discharge: fair ? ?The results of significant diagnostics from th

## 2021-09-05 ENCOUNTER — Telehealth: Payer: Self-pay | Admitting: Cardiology

## 2021-09-05 NOTE — Telephone Encounter (Signed)
**Note De-Identified Rosalene Wardrop Obfuscation** Urgent Mexiletine PA done through covermymeds. ?Key: [id number] ?

## 2021-09-05 NOTE — Telephone Encounter (Signed)
Pt c/o medication issue: ? ?1. Name of Medication: mexiletine (MEXITIL) 250 MG capsule ? ?2. How are you currently taking this medication (dosage and times per day)? Patient has not started taking it yet ? ?3. Are you having a reaction (difficulty breathing--STAT)?  ? ?4. What is your medication issue? The patient's insurance will not cover this medication without a letter from Dr. Curt Bears stating the medical necessity. She would like his Nurse to call her to follow up ?

## 2021-09-06 NOTE — Telephone Encounter (Signed)
**Note De-Identified Quiera Diffee Obfuscation** Ledell Noss Key: [id number] - PA Case ID: [id number] Need help? Call us at [phone] ?Outcome: Approved on March 27 ?Coverage Starts on: 06/12/2021 12:00:00 AM, Coverage Ends on: 06/11/2022 12:00:00 AM.  ?Drug: Mexiletine HCl '250MG'$  capsules ?Form: Administrator, sports PA Form ? ?I have notified [place] and the pt of this approval. ? ?The pt is requesting a call from scheduling as she needs to change her f/u appointment with Dr Curt Bears as she will be out of town on 12/05/21. ?She is aware that a scheduler will be calling her to re-schedule this appointment. ? ?She thanked me for my assistance. ?

## 2021-09-09 DIAGNOSIS — I10 Essential (primary) hypertension: Secondary | ICD-10-CM | POA: Diagnosis not present

## 2021-09-09 DIAGNOSIS — E785 Hyperlipidemia, unspecified: Secondary | ICD-10-CM | POA: Diagnosis not present

## 2021-09-09 DIAGNOSIS — E119 Type 2 diabetes mellitus without complications: Secondary | ICD-10-CM | POA: Diagnosis not present

## 2021-09-09 DIAGNOSIS — I519 Heart disease, unspecified: Secondary | ICD-10-CM | POA: Diagnosis not present

## 2021-09-15 DIAGNOSIS — M5030 Other cervical disc degeneration, unspecified cervical region: Secondary | ICD-10-CM | POA: Diagnosis not present

## 2021-09-15 DIAGNOSIS — M4802 Spinal stenosis, cervical region: Secondary | ICD-10-CM | POA: Diagnosis not present

## 2021-09-15 DIAGNOSIS — Z09 Encounter for follow-up examination after completed treatment for conditions other than malignant neoplasm: Secondary | ICD-10-CM | POA: Diagnosis not present

## 2021-09-27 ENCOUNTER — Ambulatory Visit: Payer: Medicare HMO | Admitting: Allergy and Immunology

## 2021-09-27 ENCOUNTER — Telehealth: Payer: Self-pay | Admitting: Neurology

## 2021-09-27 ENCOUNTER — Encounter: Payer: Self-pay | Admitting: Allergy and Immunology

## 2021-09-27 ENCOUNTER — Other Ambulatory Visit: Payer: Self-pay | Admitting: Neurology

## 2021-09-27 VITALS — BP 122/66 | HR 68 | Temp 97.6°F | Resp 16 | Ht 65.0 in | Wt 167.2 lb

## 2021-09-27 DIAGNOSIS — J324 Chronic pansinusitis: Secondary | ICD-10-CM

## 2021-09-27 DIAGNOSIS — J339 Nasal polyp, unspecified: Secondary | ICD-10-CM | POA: Diagnosis not present

## 2021-09-27 DIAGNOSIS — J3089 Other allergic rhinitis: Secondary | ICD-10-CM

## 2021-09-27 DIAGNOSIS — J4550 Severe persistent asthma, uncomplicated: Secondary | ICD-10-CM | POA: Diagnosis not present

## 2021-09-27 DIAGNOSIS — I493 Ventricular premature depolarization: Secondary | ICD-10-CM

## 2021-09-27 DIAGNOSIS — T7800XA Anaphylactic reaction due to unspecified food, initial encounter: Secondary | ICD-10-CM

## 2021-09-27 DIAGNOSIS — K219 Gastro-esophageal reflux disease without esophagitis: Secondary | ICD-10-CM | POA: Diagnosis not present

## 2021-09-27 DIAGNOSIS — M5412 Radiculopathy, cervical region: Secondary | ICD-10-CM

## 2021-09-27 MED ORDER — IPRATROPIUM-ALBUTEROL 0.5-2.5 (3) MG/3ML IN SOLN: 3.0000 mL | Freq: Four times a day (QID) | RESPIRATORY_TRACT | 1 refills | Status: DC | PRN

## 2021-09-27 MED ORDER — FLUTICASONE PROPIONATE 50 MCG/ACT NA SUSP: 1.0000 | Freq: Every day | NASAL | 1 refills | Status: DC

## 2021-09-27 MED ORDER — MONTELUKAST SODIUM 10 MG PO TABS: 10.0000 mg | ORAL_TABLET | Freq: Every evening | ORAL | 1 refills | Status: DC

## 2021-09-27 MED ORDER — CETIRIZINE HCL 10 MG PO TABS: 10.0000 mg | ORAL_TABLET | Freq: Two times a day (BID) | ORAL | 1 refills | Status: DC | PRN

## 2021-09-27 MED ORDER — AEROCHAMBER MV MISC: 1.0000 | Freq: Every day | 1 refills | Status: DC

## 2021-09-27 MED ORDER — OMEPRAZOLE 40 MG PO CPDR: 40.0000 mg | DELAYED_RELEASE_CAPSULE | Freq: Every morning | ORAL | 1 refills | Status: DC

## 2021-09-27 MED ORDER — FAMOTIDINE 40 MG PO TABS: 40.0000 mg | ORAL_TABLET | Freq: Every evening | ORAL | 1 refills | Status: DC

## 2021-09-27 MED ORDER — ALBUTEROL SULFATE HFA 108 (90 BASE) MCG/ACT IN AERS: 2.0000 | INHALATION_SPRAY | RESPIRATORY_TRACT | 1 refills | Status: DC | PRN

## 2021-09-27 MED ORDER — EPINEPHRINE 0.3 MG/0.3ML IJ SOAJ: 0.3000 mg | INTRAMUSCULAR | 1 refills | Status: DC | PRN

## 2021-09-27 MED ORDER — IPRATROPIUM-ALBUTEROL 0.5-2.5 (3) MG/3ML IN SOLN
3.0000 mL | Freq: Four times a day (QID) | RESPIRATORY_TRACT | 1 refills | Status: DC | PRN
Start: 2021-09-27 — End: 2022-05-16

## 2021-09-27 MED ORDER — BUDESONIDE 0.25 MG/2ML IN SUSP: 0.2500 mg | Freq: Two times a day (BID) | RESPIRATORY_TRACT | 1 refills | Status: DC | PRN

## 2021-09-27 MED ORDER — BREZTRI AEROSPHERE 160-9-4.8 MCG/ACT IN AERO: 2.0000 | INHALATION_SPRAY | Freq: Two times a day (BID) | RESPIRATORY_TRACT | 1 refills | Status: DC

## 2021-09-27 NOTE — Patient Instructions (Addendum)
?  1.  Allergen avoidance measures - dust mite, cat, dog, mammal consumption ? ?2.  Continue to treat and prevent inflammation: ? ?A. Breztri - 2 inhalations 1-2 times per day w/spacer (empty lungs) ?B. Flonase / Nasacort - 1-2 sprays each nostril 1-2 times per day ?C. Montelukast 10 mg - 1 tablet 1 time per day ?D. Benralizumab injections  ? ?3.  Continue to treat and prevent reflux/LPR: ? ?A. Minimize caffeine / chocolate consumption ?B. Omeprazole 40 mg - 1 tablet in AM ?C. Famotidine 40 mg - 1 tablet in PM ? ?4.  If needed: ? ?A. Albuterol MDI or nebulization ?B. Nasal saline ?C. Antihistamine ?D. Epi-Pen ? ?5.  Progressive hoarseness??? ? ?6. Eliminate all forms of caffeine consumption ? ?7.  Return to clinic in 12 weeks or earlier if problem ?

## 2021-09-27 NOTE — Telephone Encounter (Signed)
Sent to [facility] ph # [phone]. 

## 2021-09-27 NOTE — Progress Notes (Signed)
? ?Megan Hull ? ? ?Follow-up Note ? ?Referring Provider: [name], DO ?Primary Provider: [name], DO ?Date of Office Visit: 09/27/2021 ? ?Subjective:  ? ?Megan Hull (DOB: 1941-05-03) is a 81 y.o. female who returns to the [facility] on 09/27/2021 in re-evaluation of the following: ? ?HPI: Megan Hull returns to this clinic in evaluation of asthma, allergic rhinitis, chronic sinusitis, history of nasal polyposis, LPR, and alpha gal syndrome.  Her last visit to this clinic was 24 May 2021. ? ?While using benralizumab injections, having completed a total of 3 injections, she has noticed dramatic improvement regarding her asthma.  She rarely uses a short acting bronchodilator.  She also continues to use her Megan Hull twice a day. ? ?She believes that her upper airway is doing quite well while using a nasal steroid and montelukast. ? ?Since I have seen her in this clinic she has not required a systemic steroid or antibiotic for any type of airway issue. ? ?She does believe that she has had some persistent hoarseness.  She does use a spacer with her Breztri. ? ?She believes that her reflux is under very good control on her current plan. ? ?She informs me that she has had a arrhythmia and she was given a medication to address that issue and it made her feel terrible and thus she is not using this medication.  It should be noted that her sensation of irregular beats appears to occur in the Hull after consuming her coffee. ? ?She does not consume mammal. ? ?Allergies as of 09/27/2021   ? ?   Reactions  ? Nexium [esomeprazole Magnesium] Anaphylaxis  ? Pt states she was unable to swallow for several hours after taking the nexium  ? Other Hives, Shortness Of Breath, Other (See Comments)  ? Red Fish (Hives) ?Bojangle's chicken (swelling and shortness of breath) PAPRIKA  ? Codeine Other (See Comments)  ? REACTION: chest pain  ? Ace Inhibitors  Swelling  ? Angioedema  ? Latex Other (See Comments)  ? Causes skin to turn red per patient   ? Monosodium Glutamate Other (See Comments)  ? dizziness  ? Amlodipine Other (See Comments)  ? Lower extremity swelling, itching  ? Shellfish Allergy Hives  ? Tape Rash  ? Coban wrap turned her arm red (after stress test)  ? ?  ? ?  ?Medication List  ? ? ?AeroChamber MV inhaler ?Use as instructed ?  ?AIRBORNE PO ?Take 1 tablet by mouth daily. ?  ?albuterol 108 (90 Base) MCG/ACT inhaler ?Commonly known as: VENTOLIN HFA ?Inhale 2 puffs into the lungs every 4 (four) hours as needed for wheezing or shortness of breath. ?  ?ANTACID ADVANCED PO ?Take 1 tablet by mouth daily as needed (indigestion). ?  ?aspirin EC 81 MG tablet ?Take 1 tablet (81 mg total) by mouth daily. Swallow whole. ?  ?Breztri Aerosphere 160-9-4.8 MCG/ACT Aero ?Generic drug: Budeson-Glycopyrrol-Formoterol ?Inhale 2 puffs into the lungs in the Hull and at bedtime. ?  ?budesonide 0.25 MG/2ML nebulizer solution ?Commonly known as: PULMICORT ?Take 0.25 mg by nebulization 2 (two) times daily as needed (shortness of breath, wheezing). ?  ?cetirizine 10 MG tablet ?Commonly known as: ZYRTEC ?Take 1 tablet (10 mg total) by mouth 2 (two) times daily as needed for allergies (Can use an extra dose during flare ups.). ?  ?diphenhydrAMINE 25 MG tablet ?Commonly known as: BENADRYL ?Take 2 tablets by mouth as needed for anaphylaxis. ?  ?  EPINEPHrine 0.3 mg/0.3 mL Soaj injection ?Commonly known as: EPI-PEN ?Inject 0.3 mLs (0.3 mg total) into the muscle once as needed (anaphylaxis). ?  ?famotidine 40 MG tablet ?Commonly known as: PEPCID ?Take 1 tablet (40 mg total) by mouth at bedtime. ?  ?fluticasone 50 MCG/ACT nasal spray ?Commonly known as: Flonase ?Place 1 spray into both nostrils daily. ?  ?furosemide 40 MG tablet ?Commonly known as: Lasix ?Take 1 tablet (40 mg total) by mouth daily. ?  ?hydrALAZINE 25 MG tablet ?Commonly known as: APRESOLINE ?TAKE 1 TABLET THREE TIMES  DAILY AS NEEDED FOR SYSTOLIC BLOOD PRESSURE GREATER THAN 150 (NEED MD APPOINTMENT) ?  ?ipratropium-albuterol 0.5-2.5 (3) MG/3ML Soln ?Commonly known as: DUONEB ?Take 3 mLs by nebulization every 6 (six) hours as needed (wheezing, shortness of breath). ?  ?isosorbide mononitrate 30 MG 24 hr tablet ?Commonly known as: IMDUR ?Take 1 tablet (30 mg total) by mouth daily. ?  ?mexiletine 250 MG capsule ?Commonly known as: MEXITIL ?Take 1 capsule (250 mg total) by mouth 2 (two) times daily. ?  ?montelukast 10 MG tablet ?Commonly known as: SINGULAIR ?Take 10 mg by mouth at bedtime. ?  ?omeprazole 40 MG capsule ?Commonly known as: PRILOSEC ?Take 1 capsule (40 mg total) by mouth every Hull. ?  ?PROBIOTIC DAILY PO ?Take 1 capsule by mouth daily. ?  ?simvastatin 40 MG tablet ?Commonly known as: ZOCOR ?TAKE 1 TABLET EVERY DAY ?What changed: when to take this ?  ? ?Past Medical History:  ?Diagnosis Date  ? Arthritis   ? CHF (congestive heart failure) ([place])   ? Diabetes mellitus without complication ([place])   ? borderline  ? Diarrhea   ? Diverticulosis   ? Dysrhythmia   ? controlled with Metoprolol-12-19-13 LOV -Dr. Gwenlyn Found sees yearly  ? Family history of premature CAD   ? GERD (gastroesophageal reflux disease)   ? Heart murmur   ? yrs ago  ? HLD (hyperlipidemia)   ? pt reports cholesterol normal now (07/21/19).  ? Hypertension   ? Lower extremity edema   ? Status post dilation of esophageal narrowing   ? Supraventricular arrhythmia   ? Tobacco abuse   ? ? ?Past Surgical History:  ?Procedure Laterality Date  ? ANTERIOR CERVICAL DECOMP/DISCECTOMY FUSION N/A 06/08/2015  ? Procedure: CERVICAL FIVE-SIX ,CERVICAL SIX-SEVEN ANTERIOR CERVICAL DECOMPRESSION/DISCECTOMY FUSION PLATING BONEGRAFT;  Surgeon: Megan Miss, MD;  Location: [facility];  Service: Neurosurgery;  Laterality: N/A;  ? CARDIAC CATHETERIZATION  01/15/2009  ? non-critical CAD, 60% mid LAD lesion (Dr. Adora Hull)  ? CATARACT EXTRACTION, BILATERAL    ? most recent 11/19  ?  COLONOSCOPY    ? ESOPHAGEAL DILATION  2014  ? infected lymph node surgery  80's  ? NM MYOCAR PERF WALL MOTION  04/2011  ? bruce myoview; normal pattern of perfusion, EF 80%, low risk scan  ? SINUS ENDO WITH FUSION Bilateral 03/04/2020  ? Procedure: SINUS ENDOSCOPY WITH FUSION NAVIGATION;  Surgeon: Jerrell Belfast, MD;  Location: [place];  Service: ENT;  Laterality: Bilateral;  ? TONSILLECTOMY  70's  ? TRANSTHORACIC ECHOCARDIOGRAM  12/2007  ? mild conc LVH  ? TURBINATE REDUCTION Bilateral 03/04/2020  ? Procedure: TURBINATE REDUCTION;  Surgeon: Jerrell Belfast, MD;  Location: [place];  Service: ENT;  Laterality: Bilateral;  ? ? ?Review of systems negative except as noted in HPI / PMHx or noted below: ? ?Review of Systems  ?Constitutional: Negative.   ?HENT: Negative.    ?Eyes: Negative.   ?Respiratory: Negative.    ?  Cardiovascular: Negative.   ?Gastrointestinal: Negative.   ?Genitourinary: Negative.   ?Musculoskeletal: Negative.   ?Skin: Negative.   ?Neurological: Negative.   ?Endo/Heme/Allergies: Negative.   ?Psychiatric/Behavioral: Negative.    ? ? ?Objective:  ? ?Vitals:  ? 09/27/21 1139  ?BP: 122/66  ?Pulse: 68  ?Resp: 16  ?Temp: 97.6 ?F (36.4 ?C)  ?SpO2: 98%  ? ?Height: '5\' 5"'$  (165.1 cm)  ?Weight: 167 lb 3.2 oz (75.8 kg)  ? ?Physical Exam ?Constitutional:   ?   Appearance: She is not diaphoretic.  ?HENT:  ?   Head: Normocephalic.  ?   Right Ear: Tympanic membrane, ear canal and external ear normal.  ?   Left Ear: Tympanic membrane, ear canal and external ear normal.  ?   Nose: Nose normal. No mucosal edema or rhinorrhea.  ?   Mouth/Throat:  ?   Pharynx: Uvula midline. No oropharyngeal exudate.  ?Eyes:  ?   Conjunctiva/sclera: Conjunctivae normal.  ?Neck:  ?   Thyroid: No thyromegaly.  ?   Trachea: Trachea normal. No tracheal tenderness or tracheal deviation.  ?Cardiovascular:  ?   Rate and Rhythm: Normal rate and regular rhythm.  ?   Heart sounds: Normal heart sounds, S1 normal  and S2 normal. No murmur heard. ?Pulmonary:  ?   Effort: No respiratory distress.  ?   Breath sounds: Normal breath sounds. No stridor. No wheezing or rales.  ?Lymphadenopathy:  ?   Head:  ?   Right side of hea

## 2021-09-28 ENCOUNTER — Encounter: Payer: Self-pay | Admitting: Allergy and Immunology

## 2021-09-29 ENCOUNTER — Institutional Professional Consult (permissible substitution): Payer: Medicare HMO | Admitting: Neurology

## 2021-10-06 ENCOUNTER — Ambulatory Visit (INDEPENDENT_AMBULATORY_CARE_PROVIDER_SITE_OTHER): Payer: Medicare HMO

## 2021-10-06 DIAGNOSIS — J4550 Severe persistent asthma, uncomplicated: Secondary | ICD-10-CM

## 2021-10-06 MED ORDER — BENRALIZUMAB 30 MG/ML ~~LOC~~ SOSY
30.0000 mg | PREFILLED_SYRINGE | SUBCUTANEOUS | Status: AC
Start: 2021-10-06 — End: ?
  Administered 2021-10-06 – 2024-07-18 (×17): 30 mg via SUBCUTANEOUS

## 2021-10-09 DIAGNOSIS — E119 Type 2 diabetes mellitus without complications: Secondary | ICD-10-CM | POA: Diagnosis not present

## 2021-10-09 DIAGNOSIS — I10 Essential (primary) hypertension: Secondary | ICD-10-CM | POA: Diagnosis not present

## 2021-10-09 DIAGNOSIS — E785 Hyperlipidemia, unspecified: Secondary | ICD-10-CM | POA: Diagnosis not present

## 2021-10-09 DIAGNOSIS — I519 Heart disease, unspecified: Secondary | ICD-10-CM | POA: Diagnosis not present

## 2021-10-10 ENCOUNTER — Telehealth: Payer: Self-pay | Admitting: Cardiovascular Disease

## 2021-10-10 NOTE — Telephone Encounter (Signed)
Spoke with Naida Sleight from pt's PCP office who wanted to make [place] aware that pt started taking the mexiletine '250mg'$  BID for 2 days and because of side effects she decided to stop taking this medication. Pt was told to call Dr. Curt Bears office to see if she could be seen in the next week or 2 to discuss this and possible other options for lower dose of this medication or another medication to treat PVCs. Pt felt disoriented and dizzy. Pt told Naida Sleight she thought she was going to possibly fall and she didn't want that to happen. Pt coming on Monday, May 8 to see Dian Situ to discuss medication changes. Will route to Dr. Curt Bears and Dian Situ. ?

## 2021-10-10 NOTE — Telephone Encounter (Signed)
Pt c/o medication issue: ? ?1. Name of Medication:  ? mexiletine (MEXITIL) 250 MG capsule  ? ?2. How are you currently taking this medication (dosage and times per day)? Take 1 capsule (250 mg total) by mouth 2 (two) times daily. ? ?3. Are you having a reaction (difficulty breathing--STAT)? No ? ?4. What is your medication issue? Avery from pt's PCP office called and stated that pt notified them that mediation is causing dizziness, drowsiness, and disoriented. She said pt stated that she took medication for 2 days and stopped due to this. Please advise ? ?

## 2021-10-14 NOTE — Telephone Encounter (Signed)
Attempted to reach pt to instruct to start Amiodarone (normal loading dose). ?No answer. ?Pt scheduled to see EP PA on Monday. ?Will forward to him so they may start it at that appt. ? ?

## 2021-10-17 ENCOUNTER — Ambulatory Visit: Payer: Medicare HMO | Admitting: Student

## 2021-10-17 ENCOUNTER — Encounter: Payer: Self-pay | Admitting: Student

## 2021-10-17 VITALS — BP 94/56 | HR 68 | Ht 65.0 in | Wt 164.0 lb

## 2021-10-17 DIAGNOSIS — I493 Ventricular premature depolarization: Secondary | ICD-10-CM | POA: Diagnosis not present

## 2021-10-17 DIAGNOSIS — I2510 Atherosclerotic heart disease of native coronary artery without angina pectoris: Secondary | ICD-10-CM | POA: Diagnosis not present

## 2021-10-17 DIAGNOSIS — I10 Essential (primary) hypertension: Secondary | ICD-10-CM | POA: Diagnosis not present

## 2021-10-17 LAB — COMPREHENSIVE METABOLIC PANEL
ALT: 15 IU/L (ref 0–32)
AST: 19 IU/L (ref 0–40)
Albumin/Globulin Ratio: 1.6 (ref 1.2–2.2)
Albumin: 3.9 g/dL (ref 3.6–4.6)
Alkaline Phosphatase: 80 IU/L (ref 44–121)
BUN/Creatinine Ratio: 14 (ref 12–28)
BUN: 15 mg/dL (ref 8–27)
Bilirubin Total: 0.5 mg/dL (ref 0.0–1.2)
CO2: 26 mmol/L (ref 20–29)
Calcium: 8.8 mg/dL (ref 8.7–10.3)
Chloride: 102 mmol/L (ref 96–106)
Creatinine, Ser: 1.06 mg/dL — ABNORMAL HIGH (ref 0.57–1.00)
Globulin, Total: 2.5 g/dL (ref 1.5–4.5)
Glucose: 113 mg/dL — ABNORMAL HIGH (ref 70–99)
Potassium: 4.1 mmol/L (ref 3.5–5.2)
Sodium: 139 mmol/L (ref 134–144)
Total Protein: 6.4 g/dL (ref 6.0–8.5)
eGFR: 53 mL/min/{1.73_m2} — ABNORMAL LOW (ref >59)

## 2021-10-17 LAB — TSH: TSH: 0.544 u[IU]/mL (ref 0.450–4.500)

## 2021-10-17 MED ORDER — METOPROLOL SUCCINATE ER 50 MG PO TB24: 50.0000 mg | ORAL_TABLET | Freq: Every day | ORAL | 1 refills | Status: DC

## 2021-10-17 MED ORDER — AMIODARONE HCL 200 MG PO TABS: ORAL_TABLET | ORAL | 1 refills | Status: DC

## 2021-10-17 NOTE — Progress Notes (Signed)
? ?Electrophysiology Office Note ? ? ?Date:  10/17/2021  ? ?ID:  VERLIE LIOTTA, DOB [birth year], MRN [id number] ? ?PCP:  [name], DO  ?Cardiologist: Gwenlyn Found ?Primary Electrophysiologist:  Shirley Friar, PA-C   ? ?Chief Complaint: PVCs ?  ?History of Present Illness: ?Megan Hull is a 81 y.o. 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 Presenting today for routine electrophysiology follow up.  ? 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 After starting, she felt severe dizziness and disorientation. This occurred with the evening dose and the following am dose.  ? ?Her symptoms of PVCs are intermittent fatigue and SOB. Also feels like her HR is "slow". When her PVCs are quiescent she has no issues with her ADLs.  She denies symptoms of palpitations, chest pain, shortness of breath, orthopnea, PND, lower extremity edema, claudication, dizziness, presyncope, syncope, bleeding, or neurologic sequela. The patient is tolerating medications without difficulties.  She has felt better overall after limiting sweets and caffeine.  ? ?Past Medical History:  ?Diagnosis Date  ? Arthritis   ? CHF (congestive heart failure) ([place])   ? Diabetes mellitus without complication ([place])   ? borderline  ? Diarrhea   ? Diverticulosis   ? Dysrhythmia   ? controlled with Metoprolol-12-19-13 LOV -Dr. Gwenlyn Found sees yearly  ? Family history of premature CAD   ? GERD (gastroesophageal reflux disease)   ? Heart murmur   ? yrs ago  ? HLD (hyperlipidemia)   ? pt reports cholesterol normal now (07/21/19).  ? Hypertension   ? Lower extremity edema   ? Status post dilation of esophageal narrowing   ? Supraventricular arrhythmia   ? Tobacco abuse   ? ?Past Surgical History:  ?Procedure Laterality Date  ? ANTERIOR CERVICAL DECOMP/DISCECTOMY FUSION N/A 06/08/2015  ? Procedure: CERVICAL FIVE-SIX ,CERVICAL SIX-SEVEN ANTERIOR CERVICAL DECOMPRESSION/DISCECTOMY FUSION  PLATING BONEGRAFT;  Surgeon: Kristeen Miss, MD;  Location: [facility];  Service: Neurosurgery;  Laterality: N/A;  ? CARDIAC CATHETERIZATION  01/15/2009  ? non-critical CAD, 60% mid LAD lesion (Dr. Adora Fridge)  ? CATARACT EXTRACTION, BILATERAL    ? most recent 11/19  ? COLONOSCOPY    ? ESOPHAGEAL DILATION  2014  ? infected lymph node surgery  80's  ? NM MYOCAR PERF WALL MOTION  04/2011  ? bruce myoview; normal pattern of perfusion, EF 80%, low risk scan  ? SINUS ENDO WITH FUSION Bilateral 03/04/2020  ? Procedure: SINUS ENDOSCOPY WITH FUSION NAVIGATION;  Surgeon: Jerrell Belfast, MD;  Location: [place] [facility];  Service: ENT;  Laterality: Bilateral;  ? TONSILLECTOMY  70's  ? TRANSTHORACIC ECHOCARDIOGRAM  12/2007  ? mild conc LVH  ? TURBINATE REDUCTION Bilateral 03/04/2020  ? Procedure: TURBINATE REDUCTION;  Surgeon: Jerrell Belfast, MD;  Location: [facility];  Service: ENT;  Laterality: Bilateral;  ? ? ? ?Current Outpatient Medications  ?Medication Sig Dispense Refill  ? albuterol (VENTOLIN HFA) 108 (90 Base) MCG/ACT inhaler Inhale 2 puffs into the lungs every 4 (four) hours as needed for wheezing or shortness of breath. 18 g 1  ? Alum & Mag Hydroxide-Simeth (ANTACID ADVANCED PO) Take 1 tablet by mouth daily as needed (indigestion).     ? aspirin EC 81 MG tablet Take 1 tablet (81 mg total) by mouth daily. Swallow whole. 90 tablet 3  ? Budeson-Glycopyrrol-Formoterol (BREZTRI AEROSPHERE) 160-9-4.8 MCG/ACT AERO Inhale 2 puffs into the lungs in the morning and  at bedtime. 32.1 g 1  ? budesonide (PULMICORT) 0.25 MG/2ML nebulizer solution Take 2 mLs (0.25 mg total) by nebulization 2 (two) times daily as needed (shortness of breath, wheezing). 120 mL 1  ? cetirizine (ZYRTEC) 10 MG tablet Take 1 tablet (10 mg total) by mouth 2 (two) times daily as needed for allergies (Can use an extra dose during flare ups.). 180 tablet 1  ? diphenhydrAMINE (BENADRYL) 25 MG tablet Take 2 tablets by mouth as needed for  anaphylaxis.    ? EPINEPHrine 0.3 mg/0.3 mL IJ SOAJ injection Inject 0.3 mg into the muscle as needed (anaphylaxis). 2 each 1  ? famotidine (PEPCID) 40 MG tablet Take 1 tablet (40 mg total) by mouth at bedtime. 90 tablet 1  ? fluticasone (FLONASE) 50 MCG/ACT nasal spray Place 1 spray into both nostrils daily. 48 g 1  ? furosemide (LASIX) 40 MG tablet Take 1 tablet (40 mg total) by mouth daily. 30 tablet 3  ? hydrALAZINE (APRESOLINE) 25 MG tablet TAKE 1 TABLET THREE TIMES DAILY AS NEEDED FOR SYSTOLIC BLOOD PRESSURE GREATER THAN 150 (NEED MD APPOINTMENT) (Patient taking differently: Take 25 mg by mouth 3 (three) times daily as needed (Systolic blood pressure greater than 150).) 90 tablet 11  ? ipratropium-albuterol (DUONEB) 0.5-2.5 (3) MG/3ML SOLN Take 3 mLs by nebulization every 6 (six) hours as needed (wheezing, shortness of breath). 150 mL 1  ? isosorbide mononitrate (IMDUR) 30 MG 24 hr tablet Take 1 tablet (30 mg total) by mouth daily. 90 tablet 3  ? montelukast (SINGULAIR) 10 MG tablet Take 1 tablet (10 mg total) by mouth at bedtime. 90 tablet 1  ? Multiple Vitamins-Minerals (AIRBORNE PO) Take 1 tablet by mouth daily.    ? omeprazole (PRILOSEC) 40 MG capsule Take 1 capsule (40 mg total) by mouth every morning. 90 capsule 1  ? Probiotic Product (PROBIOTIC DAILY PO) Take 1 capsule by mouth daily.    ? simvastatin (ZOCOR) 40 MG tablet TAKE 1 TABLET EVERY DAY 90 tablet 3  ? Spacer/Aero-Holding Chambers (AEROCHAMBER MV) inhaler 1 each by Other route daily. Use as instructed 1 each 1  ? ?Current Facility-Administered Medications  ?Medication Dose Route Frequency Provider Last Rate Last Admin  ? Benralizumab SOSY 30 mg  30 mg Subcutaneous Q8 weeks Kennith Gain, MD   30 mg at 10/06/21 1125  ? ? ?Allergies:   Nexium [esomeprazole magnesium], Other, Codeine, Ace inhibitors, Latex, Monosodium glutamate, Amlodipine, Shellfish allergy, and Tape  ? ?Social History:  The patient  reports that she quit smoking about  5 years ago. Her smoking use included cigarettes. She has a 3.75 pack-year smoking history. She has never used smokeless tobacco. She reports that she does not currently use alcohol. She reports that she does not use drugs.  ? ?Family History:  The patient's family history includes Alzheimer's disease in her sister; Breast cancer (age of onset: 63) in her sister; Cancer in her sister; Diabetes in her sister; Esophageal cancer in her brother; Heart disease in her brother, brother, father, mother, sister, and sister; Hyperlipidemia in her sister; Hypertension in her brother and sister.  ? ?Review of systems complete and found to be negative unless listed in HPI.   ? ?PHYSICAL EXAM: ?VS:  BP (!) 94/56   Pulse 68   Ht '5\' 5"'$  (1.651 m)   Wt 164 lb (74.4 kg)   SpO2 94%   BMI 27.29 kg/m?  , BMI Body mass index is 27.29 kg/m?. ?General: Pleasant, NAD. No resp  difficulty ?Psych: Normal affect. ?HEENT:  Normal, without mass or lesion.         ?Neck: Supple, no bruits or JVD. Carotids 2+. No lymphadenopathy/thyromegaly appreciated. ?Heart: PMI nondisplaced. RRR no s3, s4, or murmurs. ?Lungs:  Resp regular and unlabored, CTA. ?Abdomen: Soft, non-tender, non-distended, No HSM, BS + x 4.   ?Extremities: No clubbing, cyanosis or edema. DP/PT/Radials 2+ and equal bilaterally. ?Neuro: Alert and oriented X 3. Moves all extremities spontaneously.  ? ?EKG:  EKG is not ordered today. ? ?Recent Labs: ?09/01/2021: ALT 7; Hemoglobin 13.1; Platelets 216 ?09/02/2021: BUN 15; Creatinine, Ser 1.10; Magnesium 2.1; Potassium 4.9; Sodium 139  ? ? ?Lipid Panel  ?   ?Component Value Date/Time  ? CHOL 116 09/02/2021 0216  ? TRIG 61 09/02/2021 0216  ? HDL 41 09/02/2021 0216  ? CHOLHDL 2.8 09/02/2021 0216  ? VLDL 12 09/02/2021 0216  ? [place] 63 09/02/2021 0216  ? ? ? ?Wt Readings from Last 3 Encounters:  ?10/17/21 164 lb (74.4 kg)  ?09/27/21 167 lb 3.2 oz (75.8 kg)  ?08/31/21 167 lb 15.9 oz (76.2 kg)  ?  ? ? ?Other studies Reviewed: ?Additional  studies/ records that were reviewed today include: TTE 07/01/21  ?Review of the above records today demonstrates:  ? 1. Left ventricular ejection fraction, by estimation, is 60 to 65%. The  ?left ventricle has norma

## 2021-10-17 NOTE — Patient Instructions (Signed)
Medication Instructions:  ?Your physician has recommended you make the following change in your medication:  ? ?DO NOT TAKE CARVEDILOL ?START: Amiodarone '200mg'$  twice daily for 1 week then 1 tablet ('200mg'$ ) daily ?START: Metoprolol Succinate '50mg'$  daily at bedtime ? ?*If you need a refill on your cardiac medications before your next appointment, please call your pharmacy* ? ? ?Lab Work: ?TODAY: CMET, TSH ? ?If you have labs (blood work) drawn today and your tests are completely normal, you will receive your results only by: ?MyChart Message (if you have MyChart) OR ?A paper copy in the mail ?If you have any lab test that is abnormal or we need to change your treatment, we will call you to review the results. ? ? ?Follow-Up: ?At [facility], you and your health needs are our priority.  As part of our continuing mission to provide you with exceptional heart care, we have created designated Provider Care Teams.  These Care Teams include your primary Cardiologist (physician) and Advanced Practice Providers (APPs -  Physician Assistants and Nurse Practitioners) who all work together to provide you with the care you need, when you need it. ? ? ?Your next appointment:   ?2 month(s) ? ?The format for your next appointment:   ?In Person ? ?Provider:   ?Allegra Lai, MD or Lollie Marrow, PA-C{ ? ?  ?

## 2021-10-24 ENCOUNTER — Telehealth: Payer: Self-pay | Admitting: Cardiology

## 2021-10-24 NOTE — Telephone Encounter (Signed)
Pt c/o medication issue: ? ?1. Name of Medication:  ?simvastatin (ZOCOR) 40 MG tablet ?amiodarone (PACERONE) 200 MG tablet ? ?2. How are you currently taking this medication (dosage and times per day)? Simvastatin 1 tablet daily ? ?3. Are you having a reaction (difficulty breathing--STAT)? no ? ?4. What is your medication issue? Patient states her pharmacy will not refill her simvastatin, because it conflicts with the amiodarone . She states she is running out of the simvastatin, please advise.  ?

## 2021-10-24 NOTE — Telephone Encounter (Signed)
Spoke with Megan Hull. She states the interaction between Simvastatin and Amiodarone will increase Simvastatin, and it is usually only recommended to prescribe 20 mg. She wants clarification that 40 mg is the correct dose the pt is to be on. Will get message to Dr. Gwenlyn Found for review.  ?

## 2021-10-25 ENCOUNTER — Telehealth: Payer: Self-pay | Admitting: Cardiovascular Disease

## 2021-10-25 NOTE — Telephone Encounter (Signed)
Done. See additional encounter for more details. ?

## 2021-10-25 NOTE — Telephone Encounter (Signed)
See previous phone note:  ? ?Per Dr. Gwenlyn Found: Lorretta Harp, MD ? ?   5:48 PM ?OK to be on 40 of Simva  ? ? ? ?Patient made aware and verbalized understanding.  Called [facility] to make aware-they state the simvastatin will need a PA in order to fill due to the drug-to-drug interaction.    ? ?Routed to primary nurse to make aware.  ?

## 2021-10-25 NOTE — Telephone Encounter (Signed)
Pt c/o medication issue: ? ?1. Name of Medication:  ? simvastatin (ZOCOR) 40 MG tablet  ? ? ?2. How are you currently taking this medication (dosage and times per day)? TAKE 1 TABLET EVERY DAY ? ?3. Are you having a reaction (difficulty breathing--STAT)? No ? ?4. What is your medication issue?  Pt states that pharmacy told her that there is an interaction with this medication and  ? amiodarone (PACERONE) 200 MG tablet  ?Pt wants to know what is she suppose to do. Please advise ? ?

## 2021-10-25 NOTE — Telephone Encounter (Signed)
**Note De-Identified Neomia Herbel Obfuscation** Because another Simvastatin PA has already been started through covermymeds I called [employer] and did a Simvastatin PA with Joylene John.  ?Per Donnel Saxon they will fax [place] a determination letter to fax machine [phone] within 24 to 72 hours. ?

## 2021-11-09 DIAGNOSIS — E785 Hyperlipidemia, unspecified: Secondary | ICD-10-CM | POA: Diagnosis not present

## 2021-11-09 DIAGNOSIS — I498 Other specified cardiac arrhythmias: Secondary | ICD-10-CM | POA: Diagnosis not present

## 2021-11-09 DIAGNOSIS — I10 Essential (primary) hypertension: Secondary | ICD-10-CM | POA: Diagnosis not present

## 2021-11-09 DIAGNOSIS — G8929 Other chronic pain: Secondary | ICD-10-CM | POA: Diagnosis not present

## 2021-11-09 DIAGNOSIS — E1159 Type 2 diabetes mellitus with other circulatory complications: Secondary | ICD-10-CM | POA: Diagnosis not present

## 2021-11-09 DIAGNOSIS — M5412 Radiculopathy, cervical region: Secondary | ICD-10-CM | POA: Diagnosis not present

## 2021-11-09 DIAGNOSIS — M5442 Lumbago with sciatica, left side: Secondary | ICD-10-CM | POA: Diagnosis not present

## 2021-11-10 NOTE — Telephone Encounter (Addendum)
Followed up with [employer], simvastatin '40mg'$  approved thru 06/11/22.  Auth#: [id number].

## 2021-11-11 DIAGNOSIS — L821 Other seborrheic keratosis: Secondary | ICD-10-CM | POA: Diagnosis not present

## 2021-11-16 ENCOUNTER — Ambulatory Visit: Payer: Medicare HMO | Admitting: Cardiovascular Disease

## 2021-11-16 ENCOUNTER — Encounter: Payer: Self-pay | Admitting: Cardiovascular Disease

## 2021-11-16 DIAGNOSIS — I10 Essential (primary) hypertension: Secondary | ICD-10-CM

## 2021-11-16 DIAGNOSIS — R600 Localized edema: Secondary | ICD-10-CM

## 2021-11-16 DIAGNOSIS — I2510 Atherosclerotic heart disease of native coronary artery without angina pectoris: Secondary | ICD-10-CM

## 2021-11-16 DIAGNOSIS — E785 Hyperlipidemia, unspecified: Secondary | ICD-10-CM | POA: Diagnosis not present

## 2021-11-16 DIAGNOSIS — I350 Nonrheumatic aortic (valve) stenosis: Secondary | ICD-10-CM | POA: Diagnosis not present

## 2021-11-16 DIAGNOSIS — R002 Palpitations: Secondary | ICD-10-CM | POA: Diagnosis not present

## 2021-11-16 MED ORDER — FUROSEMIDE 40 MG PO TABS: 60.0000 mg | ORAL_TABLET | Freq: Every day | ORAL | 4 refills | Status: DC

## 2021-11-16 MED ORDER — METOPROLOL SUCCINATE ER 50 MG PO TB24: 50.0000 mg | ORAL_TABLET | Freq: Every day | ORAL | 4 refills | Status: DC

## 2021-11-16 MED ORDER — ISOSORBIDE MONONITRATE ER 30 MG PO TB24: 30.0000 mg | ORAL_TABLET | Freq: Every day | ORAL | 4 refills | Status: DC

## 2021-11-16 NOTE — Assessment & Plan Note (Signed)
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  She did have an anterior takeoff of RCA.  She denies chest pain.

## 2021-11-16 NOTE — Assessment & Plan Note (Signed)
History of essential hypertension a blood pressure measured today at 120/72.  She is on metoprolol.

## 2021-11-16 NOTE — Progress Notes (Signed)
11/16/2021 Megan Hull   [birth year]  [id number]  Primary Physician [name], DO Primary Cardiologist: Lorretta Harp MD FACP, [place], [place], [place]  HPI:  Megan Hull is a 81 y.o.   mildly overweight, single [name] with no children whom I last saw in the office 1//23.Marland KitchenHer problems include hypertension, hyperlipidemia, continued tobacco abuse of one-third pack per day, and family history of premature heart disease. She had a negative stress test on December 24, 2007. She was admitted to the [facility] on January 15, 2011, with jaw pain, thought to be an anginal equivalent. I catheterized her, revealing noncritical CAD with a 60% mid LAD lesion. She had anterior takeoff RCA. She has been asymptomatic since. She had carotid Dopplers performed at Dr. Roland Earl office which apparently were unremarkable. A lipid profile performed in March revealed total cholesterol of 142, LDL of 76, and HDL of 47. Her major complaint is of lower extremity edema, which gets worse during the day. I had done venous reflux studies on her back in November which were normal.since I saw her last a year and a half ago she has noticed increasing lower extremity edema which is dependent as well as increasing dyspnea on exertion. She denies chest pain. Her weight is stable same. She does admit to dietary indiscretion with regard to salt. She empirically increased her diuretic therapy. This resulted in improvement in her edema. She had recently come back from spending 5 weeks in [place] when I saw her last in July of last year and was anticipating going to [place]. I did do a Myoview stress test on her because of chest pain 01/12/16 which was normal. She developed an angioedema in August of last year probably related to her ARB. She didn't require intubation for airway protection. She had no allergy testing and ARB allergy was confirmed. She was begun on amlodipine which resulted in increasing lower  extremity edema, fluid retention and dyspnea. Her amlodipine was discontinued and she was begun on hydralazine. She returns today with complaints of increasing dyspnea on exertion and lower extremity edema. Her PCP recently increased her diuretic which resulted in some improvement in her edema. Since I saw her back 6 months ago she continues to complain of dyspnea on exertion which is progressive.    She saw  Kerin Ransom in the office 05/30/2018 complaining some neck pain and shortness of breath.  A Myoview stress test was ordered which was completely normal and a 2D echo revealed normal LV systolic function with grade 1 diastolic dysfunction.      She was seen in the emergency room by Dr. Christy Gentles on 05/16/2019 with tachypalpitations which began at night while she was watching TV.  She she thought that she was in bigeminy or had irregular heart rhythm.  She tried a Valsalva maneuver which did not work.  When she arrived to the emergency room she was in sinus rhythm.  She does admit to using decongestants but no pseudoephedrine.  She denies chest pain or shortness of breath.   Since I saw her in the office 6 months ago she has done well.  She was seen in the hospital in March for question TIA which was ruled out.  She was seen by EP for palpitations.  Her event monitor showed frequent PVCs with 20% burden and when she was started on mexiletine which she did not tolerate and ultimately was placed on amiodarone which has conferred clinical benefit.  Current Meds  Medication Sig   albuterol (VENTOLIN HFA) 108 (90 Base) MCG/ACT inhaler Inhale 2 puffs into the lungs every 4 (four) hours as needed for wheezing or shortness of breath.   Alum & Mag Hydroxide-Simeth (ANTACID ADVANCED PO) Take 1 tablet by mouth daily as needed (indigestion).    amiodarone (PACERONE) 200 MG tablet Take 1 tablet twice daily for 1 week then take 1 tablet daily (Patient taking differently: Take 200 mg by mouth daily. take 1 tablet  daily)   aspirin EC 81 MG tablet Take 1 tablet (81 mg total) by mouth daily. Swallow whole.   Budeson-Glycopyrrol-Formoterol (BREZTRI AEROSPHERE) 160-9-4.8 MCG/ACT AERO Inhale 2 puffs into the lungs in the morning and at bedtime.   budesonide (PULMICORT) 0.25 MG/2ML nebulizer solution Take 2 mLs (0.25 mg total) by nebulization 2 (two) times daily as needed (shortness of breath, wheezing).   cetirizine (ZYRTEC) 10 MG tablet Take 1 tablet (10 mg total) by mouth 2 (two) times daily as needed for allergies (Can use an extra dose during flare ups.).   diphenhydrAMINE (BENADRYL) 25 MG tablet Take 2 tablets by mouth as needed for anaphylaxis.   EPINEPHrine 0.3 mg/0.3 mL IJ SOAJ injection Inject 0.3 mg into the muscle as needed (anaphylaxis).   famotidine (PEPCID) 40 MG tablet Take 1 tablet (40 mg total) by mouth at bedtime.   fluticasone (FLONASE) 50 MCG/ACT nasal spray Place 1 spray into both nostrils daily.   hydrALAZINE (APRESOLINE) 25 MG tablet TAKE 1 TABLET THREE TIMES DAILY AS NEEDED FOR SYSTOLIC BLOOD PRESSURE GREATER THAN 150 (NEED MD APPOINTMENT) (Patient taking differently: Take 25 mg by mouth 3 (three) times daily as needed (Systolic blood pressure greater than 150).)   ipratropium-albuterol (DUONEB) 0.5-2.5 (3) MG/3ML SOLN Take 3 mLs by nebulization every 6 (six) hours as needed (wheezing, shortness of breath).   montelukast (SINGULAIR) 10 MG tablet Take 1 tablet (10 mg total) by mouth at bedtime.   Multiple Vitamins-Minerals (AIRBORNE PO) Take 1 tablet by mouth daily.   omeprazole (PRILOSEC) 40 MG capsule Take 1 capsule (40 mg total) by mouth every morning.   Probiotic Product (PROBIOTIC DAILY PO) Take 1 capsule by mouth daily.   simvastatin (ZOCOR) 40 MG tablet TAKE 1 TABLET EVERY DAY   Spacer/Aero-Holding Chambers (AEROCHAMBER MV) inhaler 1 each by Other route daily. Use as instructed   [DISCONTINUED] furosemide (LASIX) 40 MG tablet Take 1 tablet (40 mg total) by mouth daily. (Patient taking  differently: Take 60 mg by mouth daily.)   [DISCONTINUED] isosorbide mononitrate (IMDUR) 30 MG 24 hr tablet Take 1 tablet (30 mg total) by mouth daily.   [DISCONTINUED] metoprolol succinate (TOPROL-XL) 50 MG 24 hr tablet Take 1 tablet (50 mg total) by mouth at bedtime. Take with or immediately following a meal.   Current Facility-Administered Medications for the 11/16/21 encounter (Office Visit) with Lorretta Harp, MD  Medication   Benralizumab SOSY 30 mg     Allergies  Allergen Reactions   Nexium [Esomeprazole Magnesium] Anaphylaxis    Pt states she was unable to swallow for several hours after taking the nexium   Other Hives, Shortness Of Breath and Other (See Comments)    Red Fish (Hives) Bojangle's chicken (swelling and shortness of breath) PAPRIKA   Codeine Other (See Comments)    REACTION: chest pain   Ace Inhibitors Swelling    Angioedema    Latex Other (See Comments)    Causes skin to turn red per patient    Monosodium Glutamate  Other (See Comments)    dizziness   Amlodipine Other (See Comments)    Lower extremity swelling, itching   Shellfish Allergy Hives   Tape Rash    Coban wrap turned her arm red (after stress test)    Social History   Socioeconomic History   Marital status: Single    Spouse name: Not on file   Number of children: 0   Years of education: 12+   Highest education level: Not on file  Occupational History   Occupation: [employer]  Tobacco Use   Smoking status: Former    Packs/day: 0.25    Years: 15.00    Pack years: 3.75    Types: Cigarettes    Quit date: 02/04/2016    Years since quitting: 5.7   Smokeless tobacco: Never  Vaping Use   Vaping Use: Never used  Substance and Sexual Activity   Alcohol use: Not Currently    Comment: occasional - tequila shots on holidays   Drug use: No   Sexual activity: Not on file  Other Topics Concern   Not on file  Social History Narrative   Lives alone    Caffeine use: 2 cups coffee per day (mixed  decaf/regular)   Social Determinants of Health   Financial Resource Strain: Not on file  Food Insecurity: Not on file  Transportation Needs: Not on file  Physical Activity: Not on file  Stress: Not on file  Social Connections: Not on file  Intimate Partner Violence: Not on file     Review of Systems: General: negative for chills, fever, night sweats or weight changes.  Cardiovascular: negative for chest pain, dyspnea on exertion, edema, orthopnea, palpitations, paroxysmal nocturnal dyspnea or shortness of breath Dermatological: negative for rash Respiratory: negative for cough or wheezing Urologic: negative for hematuria Abdominal: negative for nausea, vomiting, diarrhea, bright red blood per rectum, melena, or hematemesis Neurologic: negative for visual changes, syncope, or dizziness All other systems reviewed and are otherwise negative except as noted above.    Blood pressure 120/72, pulse (!) 59, height '5\' 5"'$  (1.651 m), weight 164 lb 9.6 oz (74.7 kg), SpO2 96 %.  General appearance: alert and no distress Neck: no adenopathy, no JVD, supple, symmetrical, trachea midline, thyroid not enlarged, symmetric, no tenderness/mass/nodules, and soft carotid bruits versus transmitted murmur Lungs: clear to auscultation bilaterally Heart: Soft outflow tract murmur consistent with aortic stenosis Extremities: extremities normal, atraumatic, no cyanosis or edema Pulses: 2+ and symmetric Skin: Skin color, texture, turgor normal. No rashes or lesions Neurologic: Grossly normal  EKG not performed today  ASSESSMENT AND PLAN:   Essential hypertension History of essential hypertension a blood pressure measured today at 120/72.  She is on metoprolol.  Dyslipidemia History of dyslipidemia on simvastatin 40 mg a day with lipid profile performed 08/29/2021 revealing total cholesterol 116, LDL of 63 and HDL 41.  Lower extremity edema History of bilateral lower extremity edema on  furosemide.  Coronary artery disease History of nonobstructive CAD by cath 01/15/2011 with a 60% mid LAD lesion.  She did have an anterior takeoff of RCA.  She denies chest pain.  Palpitations History of chronic palpitations with event monitor that showed frequent PVCs (20% burden).  She was seen by EP.  She was tried on mexiletine which resulted in side effects and was ultimately started on amiodarone which she is tolerating and which has resulted in improvement in the frequency of her PVCs.  Aortic stenosis Mild to moderate aortic stenosis by 2D echo performed  07/01/2021 with an aortic valve area measuring 1 cm and a peak gradient of 23 mmHg.  We will continue to follow this noninvasively by 2D echocardiography on annual basis.     Lorretta Harp MD FACP,FACC,FAHA, [facility] 11/16/2021 12:18 PM

## 2021-11-16 NOTE — Assessment & Plan Note (Signed)
Mild to moderate aortic stenosis by 2D echo performed 07/01/2021 with an aortic valve area measuring 1 cm and a peak gradient of 23 mmHg.  We will continue to follow this noninvasively by 2D echocardiography on annual basis.

## 2021-11-16 NOTE — Assessment & Plan Note (Signed)
History of dyslipidemia on simvastatin 40 mg a day with lipid profile performed 08/29/2021 revealing total cholesterol 116, LDL of 63 and HDL 41.

## 2021-11-16 NOTE — Assessment & Plan Note (Signed)
History of bilateral lower extremity edema on furosemide.

## 2021-11-16 NOTE — Assessment & Plan Note (Signed)
History of chronic palpitations with event monitor that showed frequent PVCs (20% burden).  She was seen by EP.  She was tried on mexiletine which resulted in side effects and was ultimately started on amiodarone which she is tolerating and which has resulted in improvement in the frequency of her PVCs.

## 2021-11-16 NOTE — Patient Instructions (Signed)
Medication Instructions:  Your physician recommends that you continue on your current medications as directed. Please refer to the Current Medication list given to you today.   *If you need a refill on your cardiac medications before your next appointment, please call your pharmacy*   Testing/Procedures: Your physician has requested that you have a carotid duplex. This test is an ultrasound of the carotid arteries in your neck. It looks at blood flow through these arteries that supply the brain with blood. Allow one hour for this exam. There are no restrictions or special instructions. This procedure will be done at [address]  Your physician has requested that you have an echocardiogram. Echocardiography is a painless test that uses sound waves to create images of your heart. It provides your doctor with information about the size and shape of your heart and how well your heart's chambers and valves are working. This procedure takes approximately one hour. There are no restrictions for this procedure. To be done in January 2024. This procedure will be done at [address]    Follow-Up: At [facility], you and your health needs are our priority.  As part of our continuing mission to provide you with exceptional heart care, we have created designated Provider Care Teams.  These Care Teams include your primary Cardiologist (physician) and Advanced Practice Providers (APPs -  Physician Assistants and Nurse Practitioners) who all work together to provide you with the care you need, when you need it.  We recommend signing up for the patient portal called "MyChart".  Sign up information is provided on this After Visit Summary.  MyChart is used to connect with patients for Virtual Visits (Telemedicine).  Patients are able to view lab/test results, encounter notes, upcoming appointments, etc.  Non-urgent messages can be sent to your provider as well.   To learn more about  what you can do with MyChart, go to [email or web address].    Your next appointment:   6 month(s)  The format for your next appointment:   In Person  Provider:   Fabian Sharp, PA-C, Sande Rives, PA-C, Caron Presume, PA-C, Jory Sims, DNP, ANP, Almyra Deforest, PA-C, or Diona Browner, NP        Then, Quay Burow, MD will plan to see you again in 12 month(s).

## 2021-11-23 ENCOUNTER — Telehealth: Payer: Self-pay | Admitting: Cardiovascular Disease

## 2021-11-23 MED ORDER — AMIODARONE HCL 200 MG PO TABS: 200.0000 mg | ORAL_TABLET | Freq: Every day | ORAL | 3 refills | Status: DC

## 2021-11-23 NOTE — Telephone Encounter (Signed)
Pt's medication was sent to pt's pharmacy as requested. Confirmation received.  °

## 2021-11-23 NOTE — Telephone Encounter (Signed)
*  STAT* If patient is at the pharmacy, call can be transferred to refill team.   1. Which medications need to be refilled? (please list name of each medication and dose if known)  amiodarone (PACERONE) 200 MG tablet  2. Which pharmacy/location (including street and city if local pharmacy) is medication to be sent to?   [place] ([place]), [place] - [address]  3. Do they need a 30 day or 90 day supply? Patient was told to call if she wanted to change her script to 90 day instead of 30 day. She would like 90 called in.

## 2021-11-24 ENCOUNTER — Other Ambulatory Visit: Payer: Self-pay | Admitting: Cardiovascular Disease

## 2021-11-24 ENCOUNTER — Ambulatory Visit (HOSPITAL_COMMUNITY)
Admission: RE | Admit: 2021-11-24 | Discharge: 2021-11-24 | Disposition: A | Discharge status: Home / Self Care | Payer: Medicare HMO | Source: Ambulatory Visit | Attending: Internal Medicine | Admitting: Internal Medicine

## 2021-11-24 DIAGNOSIS — I2510 Atherosclerotic heart disease of native coronary artery without angina pectoris: Secondary | ICD-10-CM

## 2021-11-24 DIAGNOSIS — I10 Essential (primary) hypertension: Secondary | ICD-10-CM

## 2021-11-24 DIAGNOSIS — I350 Nonrheumatic aortic (valve) stenosis: Secondary | ICD-10-CM | POA: Diagnosis not present

## 2021-11-24 DIAGNOSIS — R600 Localized edema: Secondary | ICD-10-CM

## 2021-11-24 DIAGNOSIS — R002 Palpitations: Secondary | ICD-10-CM

## 2021-11-24 DIAGNOSIS — I6523 Occlusion and stenosis of bilateral carotid arteries: Secondary | ICD-10-CM | POA: Diagnosis not present

## 2021-11-24 DIAGNOSIS — E785 Hyperlipidemia, unspecified: Secondary | ICD-10-CM | POA: Diagnosis not present

## 2021-11-25 ENCOUNTER — Other Ambulatory Visit: Payer: Self-pay

## 2021-11-25 MED ORDER — AMIODARONE HCL 200 MG PO TABS: 200.0000 mg | ORAL_TABLET | Freq: Every day | ORAL | 0 refills | Status: DC

## 2021-11-29 ENCOUNTER — Ambulatory Visit: Payer: Medicare HMO | Admitting: Cardiology

## 2021-11-30 ENCOUNTER — Ambulatory Visit: Payer: Medicare HMO

## 2021-12-01 ENCOUNTER — Ambulatory Visit: Payer: Medicare HMO

## 2021-12-05 ENCOUNTER — Ambulatory Visit: Payer: Medicare HMO | Admitting: Cardiology

## 2021-12-09 DIAGNOSIS — I498 Other specified cardiac arrhythmias: Secondary | ICD-10-CM | POA: Diagnosis not present

## 2021-12-09 DIAGNOSIS — E1159 Type 2 diabetes mellitus with other circulatory complications: Secondary | ICD-10-CM | POA: Diagnosis not present

## 2021-12-09 DIAGNOSIS — I10 Essential (primary) hypertension: Secondary | ICD-10-CM | POA: Diagnosis not present

## 2021-12-09 DIAGNOSIS — E785 Hyperlipidemia, unspecified: Secondary | ICD-10-CM | POA: Diagnosis not present

## 2021-12-23 ENCOUNTER — Ambulatory Visit (INDEPENDENT_AMBULATORY_CARE_PROVIDER_SITE_OTHER): Payer: Medicare HMO | Admitting: *Deleted

## 2021-12-23 ENCOUNTER — Telehealth: Payer: Self-pay | Admitting: Allergy and Immunology

## 2021-12-23 DIAGNOSIS — J4550 Severe persistent asthma, uncomplicated: Secondary | ICD-10-CM

## 2021-12-23 NOTE — Telephone Encounter (Signed)
Patient came into the office and stated that she left her bedtime medication for indigestion in [place] when she was on vacation. I looked in the chart and it looks like she takes Pepcid at bedtime. Patient could not remember the name of the medication. Patient states that she has been out of this medication for a few days. Patient would like prescription called in to [facility] on [address]. Patients call back number is [phone].

## 2021-12-23 NOTE — Telephone Encounter (Signed)
Message left for patient to return my call.  

## 2021-12-27 NOTE — Telephone Encounter (Signed)
Called and spoke with patient and she stated that she found her prescription for her pepcid and did not need further assistance at this time.

## 2022-01-10 ENCOUNTER — Ambulatory Visit: Payer: Medicare HMO | Admitting: Student

## 2022-01-15 NOTE — Progress Notes (Signed)
Cardiology Office Note Date:  01/18/2022  Patient ID:  Megan Hull, Megan Hull 03/05/1941, MRN [id number] PCP:  [name], DO  Cardiologist:  Dr. Gwenlyn Found Electrophysiologist: Dr. Curt Bears    Chief Complaint: 2 mo  History of Present Illness: Megan Hull is a 81 y.o. female with history of DM, HTN, HLD, CAD (60% LAD), PVCs  She comers in today to be seen for Dr. Curt Bears, last seen by him 08/31/21, discussed monitoring with a 20% PVC burden that she was symptomatic with, at that time she had adjusted her diet to vegetarian, her BS better and feeling better. She wasted to avoid ablation and started on mexiletine '250mg'$  BID  She saw A. Tillery, PA-C 10/17/21, developed significant dizziness/disorientation with the Mexiletine this was stopped and she was started on amiodarone. '200mg'$  BID x7 days then daily Her BP was low and coreg was changed to Toprol  She saw Dr. Gwenlyn Found 11/16/21, she reported feeling better on the amiodarone, BP was good, planned on annual echos to monitor her mild-mod AS.  TODAY She is doing well The only thing that is new is that infrequently she will have a bit of a stuttered breath.  Not SOB, but a sudden/almost reflexive quick couple breaths. I don't think this is amiodarone or cardiac, advised to d/w her PMD.  No cough, no SOB or DOE. She no longer feels the PVCs but when checking her pulse ox will still see an irregularity though they are less. No CP No near syncope or syncope. She feels well  AAD hx Intolerant of mexiletine with dizziness Amiodarone started may 2023   Past Medical History:  Diagnosis Date   Arthritis    CHF (congestive heart failure) (HCC)    Diabetes mellitus without complication (HCC)    borderline   Diarrhea    Diverticulosis    Dysrhythmia    controlled with Metoprolol-12-19-13 LOV -Dr. Gwenlyn Found sees yearly   Family history of premature CAD    GERD (gastroesophageal reflux disease)    Heart murmur    yrs ago   HLD  (hyperlipidemia)    pt reports cholesterol normal now (07/21/19).   Hypertension    Lower extremity edema    Status post dilation of esophageal narrowing    Supraventricular arrhythmia    Tobacco abuse     Past Surgical History:  Procedure Laterality Date   ANTERIOR CERVICAL DECOMP/DISCECTOMY FUSION N/A 06/08/2015   Procedure: CERVICAL FIVE-SIX ,CERVICAL SIX-SEVEN ANTERIOR CERVICAL DECOMPRESSION/DISCECTOMY FUSION PLATING BONEGRAFT;  Surgeon: Kristeen Miss, MD;  Location: [facility];  Service: Neurosurgery;  Laterality: N/A;   CARDIAC CATHETERIZATION  01/15/2009   non-critical CAD, 60% mid LAD lesion (Dr. Adora Fridge)   CATARACT EXTRACTION, BILATERAL     most recent 11/19   COLONOSCOPY     ESOPHAGEAL DILATION  2014   infected lymph node surgery  80's   NM MYOCAR PERF WALL MOTION  04/2011   bruce myoview; normal pattern of perfusion, EF 80%, low risk scan   SINUS ENDO WITH FUSION Bilateral 03/04/2020   Procedure: SINUS ENDOSCOPY WITH FUSION NAVIGATION;  Surgeon: Jerrell Belfast, MD;  Location: [place];  Service: ENT;  Laterality: Bilateral;   TONSILLECTOMY  70's   TRANSTHORACIC ECHOCARDIOGRAM  12/2007   mild conc LVH   TURBINATE REDUCTION Bilateral 03/04/2020   Procedure: TURBINATE REDUCTION;  Surgeon: Jerrell Belfast, MD;  Location: [facility];  Service: ENT;  Laterality: Bilateral;    Current Outpatient Medications  Medication Sig Dispense  Refill   albuterol (VENTOLIN HFA) 108 (90 Base) MCG/ACT inhaler Inhale 2 puffs into the lungs every 4 (four) hours as needed for wheezing or shortness of breath. 18 g 1   Alum & Mag Hydroxide-Simeth (ANTACID ADVANCED PO) Take 1 tablet by mouth daily as needed (indigestion).      amiodarone (PACERONE) 200 MG tablet Take 1 tablet (200 mg total) by mouth daily. 90 tablet 0   aspirin EC 81 MG tablet Take 1 tablet (81 mg total) by mouth daily. Swallow whole. 90 tablet 3   Budeson-Glycopyrrol-Formoterol (BREZTRI AEROSPHERE)  160-9-4.8 MCG/ACT AERO Inhale 2 puffs into the lungs in the morning and at bedtime. 32.1 g 1   budesonide (PULMICORT) 0.25 MG/2ML nebulizer solution Take 2 mLs (0.25 mg total) by nebulization 2 (two) times daily as needed (shortness of breath, wheezing). 120 mL 1   cetirizine (ZYRTEC) 10 MG tablet Take 10 mg by mouth daily.     EPINEPHrine 0.3 mg/0.3 mL IJ SOAJ injection Inject 0.3 mg into the muscle as needed (anaphylaxis). 2 each 1   famotidine (PEPCID) 40 MG tablet Take 1 tablet (40 mg total) by mouth at bedtime. 90 tablet 1   fluticasone (FLONASE) 50 MCG/ACT nasal spray Place 1 spray into both nostrils daily. 48 g 1   furosemide (LASIX) 40 MG tablet Take 1.5 tablets (60 mg total) by mouth daily. 135 tablet 4   hydrALAZINE (APRESOLINE) 25 MG tablet TAKE 1 TABLET THREE TIMES DAILY AS NEEDED FOR SYSTOLIC BLOOD PRESSURE GREATER THAN 150 (NEED MD APPOINTMENT) 90 tablet 11   ipratropium-albuterol (DUONEB) 0.5-2.5 (3) MG/3ML SOLN Take 3 mLs by nebulization every 6 (six) hours as needed (wheezing, shortness of breath). 150 mL 1   isosorbide mononitrate (IMDUR) 30 MG 24 hr tablet Take 1 tablet (30 mg total) by mouth daily. 90 tablet 4   metoprolol succinate (TOPROL-XL) 50 MG 24 hr tablet Take 1 tablet (50 mg total) by mouth at bedtime. Take with or immediately following a meal. 90 tablet 4   montelukast (SINGULAIR) 10 MG tablet Take 1 tablet (10 mg total) by mouth at bedtime. 90 tablet 1   Multiple Vitamins-Minerals (AIRBORNE PO) Take 1 tablet by mouth daily.     omeprazole (PRILOSEC) 40 MG capsule Take 1 capsule (40 mg total) by mouth every morning. 90 capsule 1   Probiotic Product (PROBIOTIC DAILY PO) Take 1 capsule by mouth daily.     simvastatin (ZOCOR) 40 MG tablet TAKE 1 TABLET EVERY DAY 90 tablet 3   Spacer/Aero-Holding Chambers (AEROCHAMBER MV) inhaler 1 each by Other route daily. Use as instructed 1 each 1   Current Facility-Administered Medications  Medication Dose Route Frequency Provider  Last Rate Last Admin   Benralizumab SOSY 30 mg  30 mg Subcutaneous Q8 weeks Kennith Gain, MD   30 mg at 12/23/21 1029    Allergies:   Nexium [esomeprazole magnesium], Other, Codeine, Ace inhibitors, Latex, Monosodium glutamate, Amlodipine, Shellfish allergy, and Tape   Social History:  The patient  reports that she quit smoking about 5 years ago. Her smoking use included cigarettes. She has a 3.75 pack-year smoking history. She has never used smokeless tobacco. She reports that she does not currently use alcohol. She reports that she does not use drugs.   Family History:  The patient's family history includes Alzheimer's disease in her sister; Breast cancer (age of onset: 37) in her sister; Cancer in her sister; Diabetes in her sister; Esophageal cancer in her brother; Heart disease in  her brother, brother, father, mother, sister, and sister; Hyperlipidemia in her sister; Hypertension in her brother and sister.  ROS:  Please see the history of present illness.    All other systems are reviewed and otherwise negative.   PHYSICAL EXAM:  VS:  BP (!) 90/50 (BP Location: Left Arm, Patient Position: Sitting, Cuff Size: Normal)   Pulse (!) 58   Ht '5\' 5"'$  (1.651 m)   Wt 166 lb 12.8 oz (75.7 kg)   SpO2 94%   BMI 27.76 kg/m  BMI: Body mass index is 27.76 kg/m. Well nourished, well developed, in no acute distress HEENT: normocephalic, atraumatic Neck: no JVD, carotid bruits or masses Cardiac:  RRR; with prolonged ascultation I herd 2 extrasystoles only, no significant murmurs, no rubs, or gallops Lungs:  CTA b/l, no wheezing, rhonchi or rales Abd: soft, nontender MS: no deformity or atrophy Ext: no edema Skin: warm and dry, no rash Neuro:  No gross deficits appreciated Psych: euthymic mood, full affect   EKG:  not done today   07/01/2021: TTE  1. Left ventricular ejection fraction, by estimation, is 60 to 65%. The  left ventricle has normal function. The left ventricle has no  regional  wall motion abnormalities. There is mild asymmetric left ventricular  hypertrophy of the septal and basal  segments. Left ventricular diastolic parameters were normal.   2. Right ventricular systolic function is normal. The right ventricular  size is normal.   3. Left atrial size was mildly dilated.   4. The mitral valve is degenerative. Trivial mitral valve regurgitation.  No evidence of mitral stenosis.   5. The aortic valve is normal in structure. There is moderate  calcification of the aortic valve. There is moderate thickening of the  aortic valve. Aortic valve regurgitation is trivial. Mild to moderate  aortic valve stenosis.   6. The inferior vena cava is normal in size with greater than 50%  respiratory variability, suggesting right atrial pressure of 3 mmHg.   06/14/2018: stress myoview Nuclear stress EF: 73%. The left ventricular ejection fraction is hyperdynamic (>65%). There was no ST segment deviation noted during stress. The study is normal. This is a low risk study.   Normal stress nuclear study with no ischemia or infarction.  Gated ejection fraction 73% with normal wall motion.   Recent Labs: 09/01/2021: Hemoglobin 13.1; Platelets 216 09/02/2021: Magnesium 2.1 10/17/2021: ALT 15; BUN 15; Creatinine, Ser 1.06; Potassium 4.1; Sodium 139; TSH 0.544  09/02/2021: Cholesterol 116; HDL 41; LDL Cholesterol 63; Total CHOL/HDL Ratio 2.8; Triglycerides 61; VLDL 12   CrCl cannot be calculated (Patient's most recent lab result is older than the maximum 21 days allowed.).   Wt Readings from Last 3 Encounters:  01/18/22 166 lb 12.8 oz (75.7 kg)  01/17/22 162 lb (73.5 kg)  11/16/21 164 lb 9.6 oz (74.7 kg)     Other studies reviewed: Additional studies/records reviewed today include: summarized above  ASSESSMENT AND PLAN:  PVCs on amiodarone She asks that we have her PMD do her labs, she is due to have full labs in a couple weeks, and reports her insurance will only  pay for once in a certain time-frame.  I have asked that she request for [place] LFTs and TSH be included and written on her AVS for her.  HTN Repeat by myself is 120/60    Disposition: F/u with [place] in 69mo sooner if needed  Current medicines are reviewed at length with the patient today.  The patient did  not have any concerns regarding medicines.  Venetia Night, PA-C 01/18/2022 5:36 PM     [address] [place] [id number] [phone] (office)  [phone] (fax)

## 2022-01-17 ENCOUNTER — Ambulatory Visit: Payer: Medicare HMO | Admitting: Allergy and Immunology

## 2022-01-17 VITALS — BP 118/62 | HR 60 | Temp 98.2°F | Resp 16 | Ht 65.0 in | Wt 162.0 lb

## 2022-01-17 DIAGNOSIS — E559 Vitamin D deficiency, unspecified: Secondary | ICD-10-CM | POA: Insufficient documentation

## 2022-01-17 DIAGNOSIS — J339 Nasal polyp, unspecified: Secondary | ICD-10-CM | POA: Diagnosis not present

## 2022-01-17 DIAGNOSIS — J4550 Severe persistent asthma, uncomplicated: Secondary | ICD-10-CM

## 2022-01-17 DIAGNOSIS — R319 Hematuria, unspecified: Secondary | ICD-10-CM | POA: Insufficient documentation

## 2022-01-17 DIAGNOSIS — M8580 Other specified disorders of bone density and structure, unspecified site: Secondary | ICD-10-CM | POA: Insufficient documentation

## 2022-01-17 DIAGNOSIS — M5432 Sciatica, left side: Secondary | ICD-10-CM | POA: Insufficient documentation

## 2022-01-17 DIAGNOSIS — H269 Unspecified cataract: Secondary | ICD-10-CM | POA: Insufficient documentation

## 2022-01-17 DIAGNOSIS — T7800XA Anaphylactic reaction due to unspecified food, initial encounter: Secondary | ICD-10-CM

## 2022-01-17 DIAGNOSIS — J324 Chronic pansinusitis: Secondary | ICD-10-CM

## 2022-01-17 DIAGNOSIS — J3089 Other allergic rhinitis: Secondary | ICD-10-CM | POA: Diagnosis not present

## 2022-01-17 DIAGNOSIS — G8929 Other chronic pain: Secondary | ICD-10-CM | POA: Insufficient documentation

## 2022-01-17 MED ORDER — FLUTICASONE PROPIONATE 50 MCG/ACT NA SUSP
1.0000 | Freq: Every day | NASAL | 1 refills | Status: DC
Start: 2022-01-17 — End: 2022-05-16

## 2022-01-17 MED ORDER — OMEPRAZOLE 40 MG PO CPDR: 40.0000 mg | DELAYED_RELEASE_CAPSULE | Freq: Every morning | ORAL | 1 refills | Status: DC

## 2022-01-17 MED ORDER — ALBUTEROL SULFATE HFA 108 (90 BASE) MCG/ACT IN AERS: 2.0000 | INHALATION_SPRAY | RESPIRATORY_TRACT | 1 refills | Status: DC | PRN

## 2022-01-17 MED ORDER — BUDESONIDE 0.25 MG/2ML IN SUSP
0.2500 mg | Freq: Two times a day (BID) | RESPIRATORY_TRACT | 1 refills | Status: DC | PRN
Start: 2022-01-17 — End: 2023-09-18

## 2022-01-17 MED ORDER — MONTELUKAST SODIUM 10 MG PO TABS: 10.0000 mg | ORAL_TABLET | Freq: Every evening | ORAL | 1 refills | Status: DC

## 2022-01-17 MED ORDER — FAMOTIDINE 40 MG PO TABS: 40.0000 mg | ORAL_TABLET | Freq: Every evening | ORAL | 1 refills | Status: DC

## 2022-01-17 MED ORDER — BREZTRI AEROSPHERE 160-9-4.8 MCG/ACT IN AERO
2.0000 | INHALATION_SPRAY | Freq: Two times a day (BID) | RESPIRATORY_TRACT | 1 refills | Status: DC
Start: 2022-01-17 — End: 2022-05-16

## 2022-01-17 NOTE — Patient Instructions (Addendum)
  1.  Allergen avoidance measures - dust mite, cat, dog, mammal consumption  2.  Continue to treat and prevent inflammation:  A. Breztri - 2 inhalations 1-2 times per day w/spacer (empty lungs) B. Flonase - 2 sprays each nostril in evening C. Montelukast 10 mg - 1 tablet 1 time per day D. Benralizumab injections   3.  Continue to treat and prevent reflux/LPR:  A. Omeprazole 40 mg - 1 tablet in AM B. Famotidine 40 mg - 1 tablet in PM if needed  4.  If needed:  A. Albuterol MDI or nebulization B. Nasal saline C. Antihistamine D. Epi-Pen  5.  Obtain alpha-gal panel at primary care doctor with upcoming visit  6.  Return to clinic in December or earlier if problem  7. Obtain fall flu vaccine and RSV vaccine

## 2022-01-17 NOTE — Progress Notes (Unsigned)
[facility]   Follow-up Note  Referring Provider: [name], DO Primary Provider: [name], DO Date of Office Visit: 01/17/2022  Subjective:   Megan Hull (DOB: [birth year]) is a 81 y.o. female who returns to the [facility] and [place] on 01/17/2022 in re-evaluation of the following:  HPI: Shardae returns to this clinic in evaluation of asthma, allergic rhinitis, chronic sinusitis, nasal polyposis, LPR, and alpha gal syndrome.  I last saw her in this clinic on 27 September 2021.  She has done well with her lower airway and has not required any systemic steroids and can exercise without any problem while she is using Breo used mostly once a day and she continues on benralizumab injections.  Her nose is doing very well and she has not required an antibiotic to treat an episode of sinusitis while using a nasal steroid and montelukast.  She had problems with hoarseness which is much better since she decreased her Breztri to just 1 time per day.  Her reflux is going quite well on her current plan of therapy which includes a proton pump inhibitor and H2 receptor blocker.  She remains away from all mammal consumption.  Allergies as of 01/17/2022       Reactions   Nexium [esomeprazole Magnesium] Anaphylaxis   Pt states she was unable to swallow for several hours after taking the nexium   Other Hives, Shortness Of Breath, Other (See Comments)   Red Fish (Hives) Bojangle's chicken (swelling and shortness of breath) PAPRIKA   Codeine Other (See Comments)   REACTION: chest pain   Ace Inhibitors Swelling   Angioedema   Latex Other (See Comments)   Causes skin to turn red per patient    Monosodium Glutamate Other (See Comments)   dizziness   Amlodipine Other (See Comments)   Lower extremity swelling, itching   Shellfish Allergy Hives   Tape Rash   Coban wrap turned her arm red (after stress test)        Medication  List    AeroChamber MV inhaler 1 each by Other route daily. Use as instructed   AIRBORNE PO Take 1 tablet by mouth daily.   albuterol 108 (90 Base) MCG/ACT inhaler Commonly known as: VENTOLIN HFA Inhale 2 puffs into the lungs every 4 (four) hours as needed for wheezing or shortness of breath.   amiodarone 200 MG tablet Commonly known as: PACERONE Take 1 tablet (200 mg total) by mouth daily.   ANTACID ADVANCED PO Take 1 tablet by mouth daily as needed (indigestion).   aspirin EC 81 MG tablet Take 1 tablet (81 mg total) by mouth daily. Swallow whole.   Breztri Aerosphere 160-9-4.8 MCG/ACT Aero Generic drug: Budeson-Glycopyrrol-Formoterol Inhale 2 puffs into the lungs in the morning and at bedtime.   budesonide 0.25 MG/2ML nebulizer solution Commonly known as: PULMICORT Take 2 mLs (0.25 mg total) by nebulization 2 (two) times daily as needed (shortness of breath, wheezing).   cetirizine 10 MG tablet Commonly known as: ZYRTEC Take 1 tablet (10 mg total) by mouth 2 (two) times daily as needed for allergies (Can use an extra dose during flare ups.).   EPINEPHrine 0.3 mg/0.3 mL Soaj injection Commonly known as: EPI-PEN Inject 0.3 mg into the muscle as needed (anaphylaxis).   famotidine 40 MG tablet Commonly known as: PEPCID Take 1 tablet (40 mg total) by mouth at bedtime.   fluticasone 50 MCG/ACT nasal spray Commonly known as: Megan representative  1 spray into both nostrils daily.   furosemide 40 MG tablet Commonly known as: Lasix Take 1.5 tablets (60 mg total) by mouth daily.   hydrALAZINE 25 MG tablet Commonly known as: APRESOLINE TAKE 1 TABLET THREE TIMES DAILY AS NEEDED FOR SYSTOLIC BLOOD PRESSURE GREATER THAN 150 (NEED MD APPOINTMENT)   ipratropium-albuterol 0.5-2.5 (3) MG/3ML Soln Commonly known as: DUONEB Take 3 mLs by nebulization every 6 (six) hours as needed (wheezing, shortness of breath).   isosorbide mononitrate 30 MG 24 hr tablet Commonly known as:  IMDUR Take 1 tablet (30 mg total) by mouth daily.   metoprolol succinate 50 MG 24 hr tablet Commonly known as: TOPROL-XL Take 1 tablet (50 mg total) by mouth at bedtime. Take with or immediately following a meal.   montelukast 10 MG tablet Commonly known as: SINGULAIR Take 1 tablet (10 mg total) by mouth at bedtime.   omeprazole 40 MG capsule Commonly known as: PRILOSEC Take 1 capsule (40 mg total) by mouth every morning.   PROBIOTIC DAILY PO Take 1 capsule by mouth daily.   simvastatin 40 MG tablet Commonly known as: ZOCOR TAKE 1 TABLET EVERY DAY    Past Medical History:  Diagnosis Date   Arthritis    CHF (congestive heart failure) (HCC)    Diabetes mellitus without complication (HCC)    borderline   Diarrhea    Diverticulosis    Dysrhythmia    controlled with Metoprolol-12-19-13 LOV -Dr. Gwenlyn Found sees yearly   Family history of premature CAD    GERD (gastroesophageal reflux disease)    Heart murmur    yrs ago   HLD (hyperlipidemia)    pt reports cholesterol normal now (07/21/19).   Hypertension    Lower extremity edema    Status post dilation of esophageal narrowing    Supraventricular arrhythmia    Tobacco abuse     Past Surgical History:  Procedure Laterality Date   ANTERIOR CERVICAL DECOMP/DISCECTOMY FUSION N/A 06/08/2015   Procedure: CERVICAL FIVE-SIX ,CERVICAL SIX-SEVEN ANTERIOR CERVICAL DECOMPRESSION/DISCECTOMY FUSION PLATING BONEGRAFT;  Surgeon: Kristeen Miss, MD;  Location: [facility];  Service: Neurosurgery;  Laterality: N/A;   CARDIAC CATHETERIZATION  01/15/2009   non-critical CAD, 60% mid LAD lesion (Dr. Adora Fridge)   CATARACT EXTRACTION, BILATERAL     most recent 11/19   COLONOSCOPY     ESOPHAGEAL DILATION  2014   infected lymph node surgery  80's   NM MYOCAR PERF WALL MOTION  04/2011   bruce myoview; normal pattern of perfusion, EF 80%, low risk scan   SINUS ENDO WITH FUSION Bilateral 03/04/2020   Procedure: SINUS ENDOSCOPY WITH FUSION NAVIGATION;   Surgeon: Jerrell Belfast, MD;  Location: [facility];  Service: ENT;  Laterality: Bilateral;   TONSILLECTOMY  70's   TRANSTHORACIC ECHOCARDIOGRAM  12/2007   mild conc LVH   TURBINATE REDUCTION Bilateral 03/04/2020   Procedure: TURBINATE REDUCTION;  Surgeon: Jerrell Belfast, MD;  Location: [facility];  Service: ENT;  Laterality: Bilateral;    Review of systems negative except as noted in HPI / PMHx or noted below:  Review of Systems  Constitutional: Negative.   HENT: Negative.    Eyes: Negative.   Respiratory: Negative.    Cardiovascular: Negative.   Gastrointestinal: Negative.   Genitourinary: Negative.   Musculoskeletal: Negative.   Skin: Negative.   Neurological: Negative.   Endo/Heme/Allergies: Negative.   Psychiatric/Behavioral: Negative.       Objective:   Vitals:   01/17/22 1156  BP: 118/62  Pulse: 60  Resp: 16  Temp: 98.2 F (36.8 C)  SpO2: 96%   Height: '5\' 5"'$  (165.1 cm)  Weight: 162 lb (73.5 kg)   Physical Exam Constitutional:      Appearance: She is not diaphoretic.  HENT:     Head: Normocephalic.     Right Ear: Tympanic membrane, ear canal and external ear normal.     Left Ear: Tympanic membrane, ear canal and external ear normal.     Nose: Nose normal. No mucosal edema or rhinorrhea.     Mouth/Throat:     Pharynx: Uvula midline. No oropharyngeal exudate.  Eyes:     Conjunctiva/sclera: Conjunctivae normal.  Neck:     Thyroid: No thyromegaly.     Trachea: Trachea normal. No tracheal tenderness or tracheal deviation.  Cardiovascular:     Rate and Rhythm: Normal rate and regular rhythm.     Heart sounds: Normal heart sounds, S1 normal and S2 normal. No murmur heard. Pulmonary:     Effort: No respiratory distress.     Breath sounds: Normal breath sounds. No stridor. No wheezing or rales.  Lymphadenopathy:     Head:     Right side of head: No tonsillar adenopathy.     Left side of head: No tonsillar adenopathy.      Cervical: No cervical adenopathy.  Skin:    Findings: No erythema or rash.     Nails: There is no clubbing.  Neurological:     Mental Status: She is alert.     Diagnostics:    Spirometry was performed and demonstrated an FEV1 of 1.25 at 74 % of predicted.  Assessment and Plan:   1. Asthma, severe persistent, well-controlled   2. Perennial allergic rhinitis   3. Chronic pansinusitis   4. 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  Allergen avoidance measures - dust mite, cat, dog, mammal consumption  2.  Continue to treat and prevent inflammation:  A. Breztri - 2 inhalations 1-2 times per day w/spacer (empty lungs) B. Flonase - 2 sprays each nostril in evening C. Montelukast 10 mg - 1 tablet 1 time per day D. Benralizumab injections   3.  Continue to treat and prevent reflux/LPR:  A. Omeprazole 40 mg - 1 tablet in AM B. Famotidine 40 mg - 1 tablet in PM if needed  4.  If needed:  A. Albuterol MDI or nebulization B. Nasal saline C. Antihistamine D. Epi-Pen  5.  Obtain alpha-gal panel at primary care doctor with upcoming visit  6.  Return to clinic in December or earlier if problem  7. Obtain fall flu vaccine and RSV vaccine  Anjelita appears to be doing quite well at this point in time and she will continue to use anti-inflammatory agents for her airway including the use of benralizumab injections and therapy directed against reflux.  I think there may be an opportunity to consolidate some of her treatment and she can attempt to discontinue her famotidine.  Assuming she does well with this plan I will see her back in this clinic and December 2023 or earlier if there is a problem  Allena Katz, MD Allergy / [address]

## 2022-01-18 ENCOUNTER — Encounter: Payer: Self-pay | Admitting: Physician Assistant

## 2022-01-18 ENCOUNTER — Encounter: Payer: Self-pay | Admitting: Allergy and Immunology

## 2022-01-18 ENCOUNTER — Ambulatory Visit: Payer: Medicare HMO | Admitting: Physician Assistant

## 2022-01-18 VITALS — BP 90/50 | HR 58 | Ht 65.0 in | Wt 166.8 lb

## 2022-01-18 DIAGNOSIS — I493 Ventricular premature depolarization: Secondary | ICD-10-CM

## 2022-01-18 DIAGNOSIS — I10 Essential (primary) hypertension: Secondary | ICD-10-CM

## 2022-01-18 NOTE — Patient Instructions (Addendum)
Medication Instructions:   Your physician recommends that you continue on your current medications as directed. Please refer to the Current Medication list given to you today.   *If you need a refill on your cardiac medications before your next appointment, please call your pharmacy*   Lab Work:  REQUESTING LIVER AND TSH PANEL TO BE DRAWN FAXED BACK TO [phone]  ATTENTION: RENEE URSUY:  If you have labs (blood work) drawn today and your tests are completely normal, you will receive your results only by: [place] (if you have MyChart) OR A paper copy in the mail If you have any lab test that is abnormal or we need to change your treatment, we will call you to review the results.   Testing/Procedures: NONE ORDERED  TODAY    Follow-Up: At [facility], you and your health needs are our priority.  As part of our continuing mission to provide you with exceptional heart care, we have created designated Provider Care Teams.  These Care Teams include your primary Cardiologist (physician) and Advanced Practice Providers (APPs -  Physician Assistants and Nurse Practitioners) who all work together to provide you with the care you need, when you need it.  We recommend signing up for the patient portal called "MyChart".  Sign up information is provided on this After Visit Summary.  MyChart is used to connect with patients for Virtual Visits (Telemedicine).  Patients are able to view lab/test results, encounter notes, upcoming appointments, etc.  Non-urgent messages can be sent to your provider as well.   To learn more about what you can do with MyChart, go to [email or web address].    Your next appointment:    6 month(s)  The format for your next appointment:   In Person  Provider:   You may see Dr.Camnitz or one of the following Advanced Practice Providers on your designated Care Team:   Tommye Standard, [place] Legrand Como "Jonni Sanger" Chalmers Cater, [place]    Other Instructions   Important  Information About Sugar

## 2022-01-30 DIAGNOSIS — I519 Heart disease, unspecified: Secondary | ICD-10-CM | POA: Diagnosis not present

## 2022-01-30 DIAGNOSIS — E876 Hypokalemia: Secondary | ICD-10-CM | POA: Diagnosis not present

## 2022-01-30 DIAGNOSIS — I10 Essential (primary) hypertension: Secondary | ICD-10-CM | POA: Diagnosis not present

## 2022-01-30 DIAGNOSIS — E119 Type 2 diabetes mellitus without complications: Secondary | ICD-10-CM | POA: Diagnosis not present

## 2022-01-30 DIAGNOSIS — R946 Abnormal results of thyroid function studies: Secondary | ICD-10-CM | POA: Diagnosis not present

## 2022-01-30 DIAGNOSIS — J8283 Eosinophilic asthma: Secondary | ICD-10-CM | POA: Diagnosis not present

## 2022-02-07 DIAGNOSIS — I471 Supraventricular tachycardia: Secondary | ICD-10-CM | POA: Diagnosis not present

## 2022-02-07 DIAGNOSIS — N289 Disorder of kidney and ureter, unspecified: Secondary | ICD-10-CM | POA: Diagnosis not present

## 2022-02-07 DIAGNOSIS — J45909 Unspecified asthma, uncomplicated: Secondary | ICD-10-CM | POA: Diagnosis not present

## 2022-02-07 DIAGNOSIS — I2510 Atherosclerotic heart disease of native coronary artery without angina pectoris: Secondary | ICD-10-CM | POA: Diagnosis not present

## 2022-02-07 DIAGNOSIS — I493 Ventricular premature depolarization: Secondary | ICD-10-CM | POA: Diagnosis not present

## 2022-02-07 DIAGNOSIS — E1159 Type 2 diabetes mellitus with other circulatory complications: Secondary | ICD-10-CM | POA: Diagnosis not present

## 2022-02-07 DIAGNOSIS — Z9109 Other allergy status, other than to drugs and biological substances: Secondary | ICD-10-CM | POA: Diagnosis not present

## 2022-02-07 DIAGNOSIS — R946 Abnormal results of thyroid function studies: Secondary | ICD-10-CM | POA: Diagnosis not present

## 2022-02-07 DIAGNOSIS — I519 Heart disease, unspecified: Secondary | ICD-10-CM | POA: Diagnosis not present

## 2022-02-20 ENCOUNTER — Ambulatory Visit (INDEPENDENT_AMBULATORY_CARE_PROVIDER_SITE_OTHER): Payer: Medicare HMO

## 2022-02-20 DIAGNOSIS — J4550 Severe persistent asthma, uncomplicated: Secondary | ICD-10-CM

## 2022-03-21 NOTE — Progress Notes (Unsigned)
Cardiology Office Note:    Date:  03/22/2022   ID:  Megan Hull, DOB [birth year], MRN [id number]  PCP:  [name], [place] Cardiologist: Quay Burow, MD   Reason for visit: Follow-up  History of Present Illness:    Megan Hull is a 81 y.o. female with a hx of hypertension, hyperlipidemia, tobacco use - quit 2017, CAD, frequent PVCs.  Last saw Dr. Gwenlyn Found in June 2023.  She was doing well without chest pain.  PVC burden had improved with amiodarone.  Today, she states she called in with some pinching pain in her left chest.  She mentions pinching sensation under her left breast that occurs at rest.  This occurs rarely, nonexertional.  When it occurs, she rubs it and takes a probiotic.  She wonders if it secondary to gas which she has a history of.  She also gets associated back tightness with it.  Next, she mentions sometimes spontaneously having to take a deep breath.  She wonders if this has started after taking amiodarone.  She does feel like amiodarone has been very effective and her palpitations are greatly improved.  Lastly, she mentions increased dyspnea on exertion for the last 4 months since her Lasix was decreased from 60 mg to 40 mg daily.  She states her PCP had decreased her Lasix secondary to increased creatinine in August.  She feels like she is retaining some fluid in her arms, belly and thighs.  She wears compression stockings and therefore does not get ankle edema.  She is very self-conscious when she is making her own food.  She admits to recent increased salt intake on a trip.  Her dry weight at home 167 pounds.  She is 170 pounds today so she thinks she is retaining approximately 3 pounds.  Her PCP has advised her to take her extra Lasix '20mg'$  every other day when she retains fluid.  Patient mentions that when she takes PRN hydralazine for systolic blood pressure over 150, her blood pressure will bottom to systolic 35K-093G.  Her blood  pressure is usually slightly high in the morning and better later in the day after her medications.    Past Medical History:  Diagnosis Date   Arthritis    CHF (congestive heart failure) (HCC)    Diabetes mellitus without complication (HCC)    borderline   Diarrhea    Diverticulosis    Dysrhythmia    controlled with Metoprolol-12-19-13 LOV -Dr. Gwenlyn Found sees yearly   Family history of premature CAD    GERD (gastroesophageal reflux disease)    Heart murmur    yrs ago   HLD (hyperlipidemia)    pt reports cholesterol normal now (07/21/19).   Hypertension    Lower extremity edema    Status post dilation of esophageal narrowing    Supraventricular arrhythmia    Tobacco abuse     Past Surgical History:  Procedure Laterality Date   ANTERIOR CERVICAL DECOMP/DISCECTOMY FUSION N/A 06/08/2015   Procedure: CERVICAL FIVE-SIX ,CERVICAL SIX-SEVEN ANTERIOR CERVICAL DECOMPRESSION/DISCECTOMY FUSION PLATING BONEGRAFT;  Surgeon: Kristeen Miss, MD;  Location: [facility];  Service: Neurosurgery;  Laterality: N/A;   CARDIAC CATHETERIZATION  01/15/2009   non-critical CAD, 60% mid LAD lesion (Dr. Adora Fridge)   CATARACT EXTRACTION, BILATERAL     most recent 11/19   COLONOSCOPY     ESOPHAGEAL DILATION  2014   infected lymph node surgery  80's   NM MYOCAR PERF WALL MOTION  04/2011   bruce  myoview; normal pattern of perfusion, EF 80%, low risk scan   SINUS ENDO WITH FUSION Bilateral 03/04/2020   Procedure: SINUS ENDOSCOPY WITH FUSION NAVIGATION;  Surgeon: Jerrell Belfast, MD;  Location: [facility];  Service: ENT;  Laterality: Bilateral;   TONSILLECTOMY  70's   TRANSTHORACIC ECHOCARDIOGRAM  12/2007   mild conc LVH   TURBINATE REDUCTION Bilateral 03/04/2020   Procedure: TURBINATE REDUCTION;  Surgeon: Jerrell Belfast, MD;  Location: [facility];  Service: ENT;  Laterality: Bilateral;    Current Medications: Current Meds  Medication Sig   albuterol (VENTOLIN HFA) 108 (90 Base)  MCG/ACT inhaler Inhale 2 puffs into the lungs every 4 (four) hours as needed for wheezing or shortness of breath.   Alum & Mag Hydroxide-Simeth (ANTACID ADVANCED PO) Take 1 tablet by mouth daily as needed (indigestion).    amiodarone (PACERONE) 200 MG tablet Take 1 tablet (200 mg total) by mouth daily.   aspirin EC 81 MG tablet Take 1 tablet (81 mg total) by mouth daily. Swallow whole.   Budeson-Glycopyrrol-Formoterol (BREZTRI AEROSPHERE) 160-9-4.8 MCG/ACT AERO Inhale 2 puffs into the lungs in the morning and at bedtime.   budesonide (PULMICORT) 0.25 MG/2ML nebulizer solution Take 2 mLs (0.25 mg total) by nebulization 2 (two) times daily as needed (shortness of breath, wheezing).   cetirizine (ZYRTEC) 10 MG tablet Take 10 mg by mouth daily.   EPINEPHrine 0.3 mg/0.3 mL IJ SOAJ injection Inject 0.3 mg into the muscle as needed (anaphylaxis).   famotidine (PEPCID) 40 MG tablet Take 1 tablet (40 mg total) by mouth at bedtime.   fluticasone (FLONASE) 50 MCG/ACT nasal spray Place 1 spray into both nostrils daily.   furosemide (LASIX) 40 MG tablet Take 1.5 tablets (60 mg total) by mouth daily.   hydrALAZINE (APRESOLINE) 25 MG tablet Take 25 mg by mouth as needed (Take 1/2 Tablet As Needed for Systolic Blood Pressure above 160).   ipratropium-albuterol (DUONEB) 0.5-2.5 (3) MG/3ML SOLN Take 3 mLs by nebulization every 6 (six) hours as needed (wheezing, shortness of breath).   isosorbide mononitrate (IMDUR) 30 MG 24 hr tablet Take 1 tablet (30 mg total) by mouth daily.   metoprolol succinate (TOPROL-XL) 50 MG 24 hr tablet Take 1 tablet (50 mg total) by mouth at bedtime. Take with or immediately following a meal.   montelukast (SINGULAIR) 10 MG tablet Take 1 tablet (10 mg total) by mouth at bedtime.   Multiple Vitamins-Minerals (AIRBORNE PO) Take 1 tablet by mouth daily.   omeprazole (PRILOSEC) 40 MG capsule Take 1 capsule (40 mg total) by mouth every morning.   Probiotic Product (PROBIOTIC DAILY PO) Take 1  capsule by mouth daily.   simvastatin (ZOCOR) 40 MG tablet TAKE 1 TABLET EVERY DAY   Spacer/Aero-Holding Chambers (AEROCHAMBER MV) inhaler 1 each by Other route daily. Use as instructed   Current Facility-Administered Medications for the 03/22/22 encounter (Office Visit) with Warren Lacy, PA-C  Medication   Benralizumab SOSY 30 mg     Allergies:   Nexium [esomeprazole magnesium], Other, Codeine, Ace inhibitors, Latex, Monosodium glutamate, Amlodipine, Shellfish allergy, and Tape   Social History   Socioeconomic History   Marital status: Single    Spouse name: Not on file   Number of children: 0   Years of education: 12+   Highest education level: Not on file  Occupational History   Occupation: [employer]  Tobacco Use   Smoking status: Former    Packs/day: 0.25    Years: 15.00  Total pack years: 3.75    Types: Cigarettes    Quit date: 02/04/2016    Years since quitting: 6.1   Smokeless tobacco: Never  Vaping Use   Vaping Use: Never used  Substance and Sexual Activity   Alcohol use: Not Currently    Comment: occasional - tequila shots on holidays   Drug use: No   Sexual activity: Not on file  Other Topics Concern   Not on file  Social History Narrative   Lives alone    Caffeine use: 2 cups coffee per day (mixed decaf/regular)   Social Determinants of Health   Financial Resource Strain: Not on file  Food Insecurity: Not on file  Transportation Needs: Not on file  Physical Activity: Not on file  Stress: Not on file  Social Connections: Not on file     Family History: The patient's family history includes Alzheimer's disease in her sister; Breast cancer (age of onset: 34) in her sister; Cancer in her sister; Diabetes in her sister; Esophageal cancer in her brother; Heart disease in her brother, brother, father, mother, sister, and sister; Hyperlipidemia in her sister; Hypertension in her brother and sister. There is no history of Colon cancer.  ROS:   Please  see the history of present illness.     EKGs/Labs/Other Studies Reviewed:    EKG:  The ekg ordered today demonstrates sinus bradycardia with first-degree AV block, heart rate 56.  QRS duration 82 ms.  Recent Labs: 09/01/2021: Hemoglobin 13.1; Platelets 216 09/02/2021: Magnesium 2.1 10/17/2021: ALT 15; BUN 15; Creatinine, Ser 1.06; Potassium 4.1; Sodium 139; TSH 0.544   Recent Lipid Panel Lab Results  Component Value Date/Time   CHOL 116 09/02/2021 02:16 AM   TRIG 61 09/02/2021 02:16 AM   HDL 41 09/02/2021 02:16 AM   LDLCALC 63 09/02/2021 02:16 AM    Physical Exam:    VS:  BP 102/60   Pulse (!) 56   Ht '5\' 5"'$  (1.651 m)   Wt 170 lb 9.6 oz (77.4 kg)   SpO2 99%   BMI 28.39 kg/m    No data found.       Wt Readings from Last 3 Encounters:  03/22/22 170 lb 9.6 oz (77.4 kg)  01/18/22 166 lb 12.8 oz (75.7 kg)  01/17/22 162 lb (73.5 kg)     GEN:  Well nourished, well developed in no acute distress HEENT: Normal NECK: No JVD; No carotid bruits CARDIAC: RRR, II/VI systolic murmur best heard at the RUSB RESPIRATORY:  Clear to auscultation without rales, wheezing or rhonchi  ABDOMEN: Soft, non-tender, non-distended MUSCULOSKELETAL: No edema SKIN: Warm and dry NEUROLOGIC:  Alert and oriented PSYCHIATRIC:  Normal affect     ASSESSMENT AND PLAN   CAD, no significant angina -LHC 01/15/2011 with a 60% mid LAD lesion -Continue Imdur and Toprol.  Continue aspirin and statin therapy. -Believe her pinching left chest pain is related to gas.  EKG unremarkable.  No ischemic work-up recommended at this time.  Palpitations Frequent PVCs -Monitor showed 20% PVC burden.  Did not tolerate mexiletine.  PVC burden improvement with amiodarone -Followed by EP, last seen in August 2023. -She request lab prescription for TSH with reflexive T4 for her next blood work with her PCP.  Aortic stenosis -Mild to moderate aortic stenosis by 2D echo performed 07/01/2021 with an aortic valve area  measuring 1 cm and a peak gradient of 23 mmHg -Euvolemic on exam.   -Scheduled for 2D echo in January for reeval.  Hypertension,  well controlled -Continue Toprol and Imdur.  Recommend she take 1/2 tablet hydralazine if systolic blood pressure over 160. -Goal BP is <130/80.  Recommend DASH diet (high in vegetables, fruits, low-fat dairy products, whole grains, poultry, fish, and nuts and low in sweets, sugar-sweetened beverages, and red meats), salt restriction and increase physical activity.  Hyperlipidemia with goal LDL less than 70 -LDL 63 in March 2023.  Continue Zocor.  Disposition - Follow-up in 6 months with Dr. Gwenlyn Found.   Medication Adjustments/Labs and Tests Ordered: Current medicines are reviewed at length with the patient today.  Concerns regarding medicines are outlined above.  Orders Placed This Encounter  Procedures   TSH Rfx on Abnormal to Free T4   TSH   EKG 12-Lead   No orders of the defined types were placed in this encounter.   Patient Instructions  Medication Instructions:  Hydralazine 25 mg ( Take 1/2 Tablet As Need for Systolic Blood Pressure above 160) *If you need a refill on your cardiac medications before your next appointment, please call your pharmacy*   Lab Work: TSH, TSH with reflex If you have labs (blood work) drawn today and your tests are completely normal, you will receive your results only by: [place] (if you have MyChart) OR A paper copy in the mail If you have any lab test that is abnormal or we need to change your treatment, we will call you to review the results.   Testing/Procedures: No Testing    Follow-Up: At [facility], you and your health needs are our priority.  As part of our continuing mission to provide you with exceptional heart care, we have created designated Provider Care Teams.  These Care Teams include your primary Cardiologist (physician) and Advanced Practice Providers (APPs -  Physician Assistants  and Nurse Practitioners) who all work together to provide you with the care you need, when you need it.  We recommend signing up for the patient portal called "MyChart".  Sign up information is provided on this After Visit Summary.  MyChart is used to connect with patients for Virtual Visits (Telemedicine).  Patients are able to view lab/test results, encounter notes, upcoming appointments, etc.  Non-urgent messages can be sent to your provider as well.   To learn more about what you can do with MyChart, go to [email or web address].    Your next appointment:   6 month(s)  The format for your next appointment:   In Person  Provider:   Quay Burow, MD       Signed, Warren Lacy, PA-C  03/22/2022 12:06 PM    [place]

## 2022-03-22 ENCOUNTER — Encounter: Payer: Self-pay | Admitting: Physician Assistant

## 2022-03-22 ENCOUNTER — Ambulatory Visit: Payer: Medicare HMO | Attending: Physician Assistant | Admitting: Physician Assistant

## 2022-03-22 VITALS — BP 102/60 | HR 56 | Ht 65.0 in | Wt 170.6 lb

## 2022-03-22 DIAGNOSIS — I10 Essential (primary) hypertension: Secondary | ICD-10-CM

## 2022-03-22 DIAGNOSIS — I2510 Atherosclerotic heart disease of native coronary artery without angina pectoris: Secondary | ICD-10-CM | POA: Diagnosis not present

## 2022-03-22 DIAGNOSIS — R002 Palpitations: Secondary | ICD-10-CM

## 2022-03-22 DIAGNOSIS — I350 Nonrheumatic aortic (valve) stenosis: Secondary | ICD-10-CM | POA: Diagnosis not present

## 2022-03-22 DIAGNOSIS — I493 Ventricular premature depolarization: Secondary | ICD-10-CM | POA: Diagnosis not present

## 2022-03-22 NOTE — Patient Instructions (Signed)
Medication Instructions:  Hydralazine 25 mg ( Take 1/2 Tablet As Need for Systolic Blood Pressure above 160) *If you need a refill on your cardiac medications before your next appointment, please call your pharmacy*   Lab Work: TSH, TSH with reflex If you have labs (blood work) drawn today and your tests are completely normal, you will receive your results only by: [place] (if you have MyChart) OR A paper copy in the mail If you have any lab test that is abnormal or we need to change your treatment, we will call you to review the results.   Testing/Procedures: No Testing    Follow-Up: At [facility], you and your health needs are our priority.  As part of our continuing mission to provide you with exceptional heart care, we have created designated Provider Care Teams.  These Care Teams include your primary Cardiologist (physician) and Advanced Practice Providers (APPs -  Physician Assistants and Nurse Practitioners) who all work together to provide you with the care you need, when you need it.  We recommend signing up for the patient portal called "MyChart".  Sign up information is provided on this After Visit Summary.  MyChart is used to connect with patients for Virtual Visits (Telemedicine).  Patients are able to view lab/test results, encounter notes, upcoming appointments, etc.  Non-urgent messages can be sent to your provider as well.   To learn more about what you can do with MyChart, go to [email or web address].    Your next appointment:   6 month(s)  The format for your next appointment:   In Person  Provider:   Quay Burow, MD

## 2022-04-18 ENCOUNTER — Ambulatory Visit: Payer: Medicare HMO

## 2022-04-28 ENCOUNTER — Ambulatory Visit (INDEPENDENT_AMBULATORY_CARE_PROVIDER_SITE_OTHER): Payer: Medicare HMO

## 2022-04-28 ENCOUNTER — Other Ambulatory Visit: Payer: Self-pay | Admitting: Cardiovascular Disease

## 2022-04-28 DIAGNOSIS — J4550 Severe persistent asthma, uncomplicated: Secondary | ICD-10-CM | POA: Diagnosis not present

## 2022-05-02 ENCOUNTER — Ambulatory Visit: Payer: Medicare HMO

## 2022-05-03 ENCOUNTER — Other Ambulatory Visit: Payer: Self-pay | Admitting: General Practice

## 2022-05-16 ENCOUNTER — Ambulatory Visit: Payer: Medicare HMO | Admitting: Allergy and Immunology

## 2022-05-16 VITALS — BP 118/60 | HR 59 | Temp 98.2°F | Resp 14 | Ht 65.25 in | Wt 176.4 lb

## 2022-05-16 DIAGNOSIS — J324 Chronic pansinusitis: Secondary | ICD-10-CM

## 2022-05-16 DIAGNOSIS — T7800XA Anaphylactic reaction due to unspecified food, initial encounter: Secondary | ICD-10-CM | POA: Diagnosis not present

## 2022-05-16 DIAGNOSIS — J339 Nasal polyp, unspecified: Secondary | ICD-10-CM | POA: Diagnosis not present

## 2022-05-16 DIAGNOSIS — K219 Gastro-esophageal reflux disease without esophagitis: Secondary | ICD-10-CM | POA: Diagnosis not present

## 2022-05-16 DIAGNOSIS — J3089 Other allergic rhinitis: Secondary | ICD-10-CM | POA: Diagnosis not present

## 2022-05-16 DIAGNOSIS — J4550 Severe persistent asthma, uncomplicated: Secondary | ICD-10-CM

## 2022-05-16 MED ORDER — BREZTRI AEROSPHERE 160-9-4.8 MCG/ACT IN AERO: 2.0000 | INHALATION_SPRAY | Freq: Two times a day (BID) | RESPIRATORY_TRACT | 1 refills | Status: DC

## 2022-05-16 MED ORDER — FLUTICASONE PROPIONATE 50 MCG/ACT NA SUSP
1.0000 | Freq: Every day | NASAL | 1 refills | Status: DC
Start: 2022-05-16 — End: 2022-11-21

## 2022-05-16 MED ORDER — OMEPRAZOLE 40 MG PO CPDR: 40.0000 mg | DELAYED_RELEASE_CAPSULE | Freq: Every morning | ORAL | 1 refills | Status: DC

## 2022-05-16 MED ORDER — ALBUTEROL SULFATE HFA 108 (90 BASE) MCG/ACT IN AERS: 2.0000 | INHALATION_SPRAY | RESPIRATORY_TRACT | 1 refills | Status: DC | PRN

## 2022-05-16 MED ORDER — METHYLPREDNISOLONE ACETATE 80 MG/ML IJ SUSP
80.0000 mg | Freq: Once | INTRAMUSCULAR | Status: AC
  Administered 2022-05-16: 80 mg via INTRAMUSCULAR

## 2022-05-16 MED ORDER — IPRATROPIUM-ALBUTEROL 0.5-2.5 (3) MG/3ML IN SOLN
3.0000 mL | Freq: Four times a day (QID) | RESPIRATORY_TRACT | 1 refills | Status: DC | PRN
Start: 2022-05-16 — End: 2023-05-22

## 2022-05-16 MED ORDER — AZITHROMYCIN 500 MG PO TABS: 500.0000 mg | ORAL_TABLET | Freq: Every day | ORAL | 0 refills | Status: AC

## 2022-05-16 MED ORDER — MONTELUKAST SODIUM 10 MG PO TABS: 10.0000 mg | ORAL_TABLET | Freq: Every evening | ORAL | 1 refills | Status: DC

## 2022-05-16 MED ORDER — EPINEPHRINE 0.3 MG/0.3ML IJ SOAJ: 0.3000 mg | INTRAMUSCULAR | 1 refills | Status: DC | PRN

## 2022-05-16 NOTE — Patient Instructions (Addendum)
  1.  Allergen avoidance measures - dust mite, cat, dog, mammal consumption  2.  Continue to treat and prevent inflammation:  A. Breztri - 2 inhalations 1-2 times per day w/spacer (empty lungs) B. Flonase - 2 sprays each nostril in evening C. Montelukast 10 mg - 1 tablet 1 time per day D. Benralizumab injections   3.  Continue to treat and prevent reflux/LPR:  A. Omeprazole 40 mg - 1 tablet in AM  4.  If needed:  A. Albuterol MDI or nebulization B. Nasal saline C. Antihistamine D. Epi-Pen E. Famotidine 40 mg - 1 tablet in PM   5.  For this most recent event:  A.  Depo-Medrol 80 IM delivered in clinic today B.  Azithromycin 500 mg - 1 tablet 1 time per day for 3 days only C.  Nasal saline multiple times per day  6.  Obtain alpha-gal panel    7.  Return to clinic in 6 months or earlier if problem  8. Obtain RSV vaccine

## 2022-05-16 NOTE — Progress Notes (Unsigned)
[place]   Follow-up Note  Referring Provider: [name], DO Primary Provider: [name], DO Date of Office Visit: 05/16/2022  Subjective:   Megan Hull (DOB: Nov 10, 1940) is a 81 y.o. female who returns to [facility] on 05/16/2022 in re-evaluation of the following:  HPI: Hephzibah returns to this clinic in evaluation of asthma, allergic rhinitis, chronic sinusitis, nasal polyposis, LPR, and alpha gal syndrome.  I last saw her in this clinic 17 January 2022.  Over the course of the past month she has developed increasing postnasal drip and throat clearing blowing yellow stuff out of her nose and yellow postnasal drip and she now has some issues with some cough and some shortness of breath requiring her to use a nebulizer 1 time per day over the course of the past week or so.  She is somewhat limited in the use of her Judithann Sauger for she uses this medication twice a day she gets hoarseness and thus she is only using Breztri 1 time per day.  She continues on her benralizumab injections every 8 weeks.  She believes that her reflux is under excellent control with a proton pump inhibitor and she has no need to use an H2 receptor blocker.  She remains away from consumption of mammal meat.  She has obtained a flu vaccine and a COVID-vaccine.z  Allergies as of 05/16/2022       Reactions   Nexium [esomeprazole Magnesium] Anaphylaxis   Pt states she was unable to swallow for several hours after taking the nexium   Ace Inhibitors Swelling   Angioedema   Codeine Other (See Comments)   REACTION: chest pain   Monosodium Glutamate Other (See Comments)   dizziness   Other Hives, Swelling, Other (See Comments)   Red Fish (Hives) Bojangle's chicken (swelling and shortness of breath) PAPRIKA   Amlodipine Other (See Comments)   Lower extremity swelling, itching   Latex Rash   Causes skin to turn red per patient     Shellfish Allergy Hives   Tape Rash   Coban wrap turned her arm red (after stress test)        Medication List    AeroChamber MV inhaler 1 each by Other route daily. Use as instructed   AIRBORNE PO Take 1 tablet by mouth daily.   albuterol 108 (90 Base) MCG/ACT inhaler Commonly known as: VENTOLIN HFA Inhale 2 puffs into the lungs every 4 (four) hours as needed for wheezing or shortness of breath.   amiodarone 200 MG tablet Commonly known as: PACERONE Take 1 tablet (200 mg total) by mouth daily.   ANTACID ADVANCED PO Take 1 tablet by mouth daily as needed (indigestion).   aspirin EC 81 MG tablet Take 1 tablet (81 mg total) by mouth daily. Swallow whole.   Breztri Aerosphere 160-9-4.8 MCG/ACT Aero Generic drug: Budeson-Glycopyrrol-Formoterol Inhale 2 puffs into the lungs in the morning and at bedtime.   budesonide 0.25 MG/2ML nebulizer solution Commonly known as: PULMICORT Take 2 mLs (0.25 mg total) by nebulization 2 (two) times daily as needed (shortness of breath, wheezing).   cetirizine 10 MG tablet Commonly known as: ZYRTEC Take 10 mg by mouth daily.   EPINEPHrine 0.3 mg/0.3 mL Soaj injection Commonly known as: EPI-PEN Inject 0.3 mg into the muscle as needed (anaphylaxis).   famotidine 40 MG tablet Commonly known as: PEPCID Take 1 tablet (40 mg total) by mouth at bedtime.   Fluad  Quadrivalent 0.5 ML injection Generic drug: influenza vaccine adjuvanted Inject 0.5 mLs into the muscle once.   fluticasone 50 MCG/ACT nasal spray Commonly known as: Flonase Place 1 spray into both nostrils daily.   furosemide 40 MG tablet Commonly known as: Lasix Take 1.5 tablets (60 mg total) by mouth daily.   hydrALAZINE 25 MG tablet Commonly known as: APRESOLINE TAKE 1 TABLET THREE TIMES DAILY AS NEEDED FOR SYSTOLIC BLOOD PRESSURE GREATER THAN 150 (NEED MD APPOINTMENT)   ipratropium-albuterol 0.5-2.5 (3) MG/3ML Soln Commonly known as: DUONEB Take 3 mLs by nebulization  every 6 (six) hours as needed (wheezing, shortness of breath).   isosorbide mononitrate 30 MG 24 hr tablet Commonly known as: IMDUR Take 1 tablet (30 mg total) by mouth daily.   metoprolol succinate 50 MG 24 hr tablet Commonly known as: TOPROL-XL Take 1 tablet (50 mg total) by mouth at bedtime. Take with or immediately following a meal.   montelukast 10 MG tablet Commonly known as: SINGULAIR Take 1 tablet (10 mg total) by mouth at bedtime.   omeprazole 40 MG capsule Commonly known as: PRILOSEC Take 1 capsule (40 mg total) by mouth every morning.   PROBIOTIC DAILY PO Take 1 capsule by mouth daily.   simvastatin 40 MG tablet Commonly known as: ZOCOR TAKE 1 TABLET EVERY DAY   Spikevax injection Generic drug: COVID-19 mRNA vaccine (Moderna, >/= 60yr) Inject 0.5 mLs into the muscle once.    Past Medical History:  Diagnosis Date   Arthritis    CHF (congestive heart failure) (HCC)    Diabetes mellitus without complication (HCC)    borderline   Diarrhea    Diverticulosis    Dysrhythmia    controlled with Metoprolol-12-19-13 LOV -Dr. BGwenlyn Foundsees yearly   Family history of premature CAD    GERD (gastroesophageal reflux disease)    Heart murmur    yrs ago   HLD (hyperlipidemia)    pt reports cholesterol normal now (07/21/19).   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 JAdora Fridge   CATARACT EXTRACTION, BILATERAL     most recent 11/19   COLONOSCOPY     ESOPHAGEAL DILATION  2014    infected lymph node surgery  80's   NM MYOCAR PERF WALL MOTION  04/2011   bruce myoview; normal pattern of perfusion, EF 80%, low risk scan   SINUS ENDO WITH FUSION Bilateral 03/04/2020   Procedure: SINUS ENDOSCOPY WITH FUSION NAVIGATION;  Surgeon: SJerrell Belfast MD;  [facility] [facility]  Service: ENT;  Laterality: Bilateral;   TONSILLECTOMY  70's   TRANSTHORACIC ECHOCARDIOGRAM  12/2007   mild conc LVH   TURBINATE REDUCTION Bilateral 03/04/2020   Procedure: TURBINATE REDUCTION;  Surgeon: SJerrell Belfast MD;  [facility]  Service: ENT;  Laterality: Bilateral;    Review of systems negative except as noted in HPI / PMHx or noted below:  Review of Systems  Constitutional: Negative.   HENT: Negative.    Eyes: Negative.   Respiratory: Negative.  Cardiovascular: Negative.   Gastrointestinal: Negative.   Genitourinary: Negative.   Musculoskeletal: Negative.   Skin: Negative.   Neurological: Negative.   Endo/Heme/Allergies: Negative.   Psychiatric/Behavioral: Negative.       Objective:   Vitals:   05/16/22 0918  BP: 118/60  Pulse: (!) 59  Resp: 14  Temp: 98.2 F (36.8 C)  SpO2: 98%   Height: 5' 5.25" (165.7 cm)  Weight: 176 lb 6.4 oz (80 kg)   Physical Exam Constitutional:      Appearance: She is not diaphoretic.  HENT:     Head: Normocephalic.     Right Ear: Tympanic membrane, ear canal and external ear normal.     Left Ear: Tympanic membrane, ear canal and external ear normal.     Nose: Nose normal. No mucosal edema or rhinorrhea.     Mouth/Throat:     Pharynx: Uvula midline. No oropharyngeal exudate.  Eyes:     Conjunctiva/sclera: Conjunctivae normal.  Neck:     Thyroid: No thyromegaly.     Trachea: Trachea normal. No tracheal tenderness or tracheal deviation.  Cardiovascular:     Rate and Rhythm: Normal rate and regular rhythm.     Heart sounds: Normal heart sounds, S1 normal and S2 normal. No murmur  heard. Pulmonary:     Effort: No respiratory distress.     Breath sounds: Normal breath sounds. No stridor. No wheezing or rales.  Lymphadenopathy:     Head:     Right side of head: No tonsillar adenopathy.     Left side of head: No tonsillar adenopathy.     Cervical: No cervical adenopathy.  Skin:    Findings: No erythema or rash.     Nails: There is no clubbing.  Neurological:     Mental Status: She is alert.     Diagnostics:    Spirometry was performed and demonstrated an FEV1 of 1.01 at 56% of predicted.  Her previous FEV1 was 1.25.  Assessment and Plan:   1. Not well controlled severe persistent asthma   2. Perennial allergic rhinitis   3. Chronic pansinusitis   4. 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 LPRD (laryngopharyngeal reflux disease)    1.  Allergen avoidance measures - dust mite, cat, dog, mammal consumption  2.  Continue to treat and prevent inflammation:  A. Breztri - 2 inhalations 1-2 times per day w/spacer (empty lungs) B. Flonase - 2 sprays each nostril in evening C. Montelukast 10 mg - 1 tablet 1 time per day D. Benralizumab injections   3.  Continue to treat and prevent reflux/LPR:  A. Omeprazole 40 mg - 1 tablet in AM  4.  If needed:  A. Albuterol MDI or nebulization B. Nasal saline C. Antihistamine D. Epi-Pen E. Famotidine 40 mg - 1 tablet in PM   5.  For this most recent event:  A.  Depo-Medrol 80 IM delivered in clinic today B.  Azithromycin 500 mg - 1 tablet 1 time per day for 3 days only C.  Nasal saline multiple times per day  6.  Obtain alpha-gal panel    7.  Return to clinic in 6 months or earlier if problem  8. Obtain RSV vaccine  Elenie has had a lingering respiratory tract issue for over a month and now she has developed some cough and shortness of breath and going to assume that she might have a low-grade infection as well as some inflammation of her  airway and we will treat her with a  systemic steroid and a broad-spectrum antibiotic as noted above.  She will continue to use a collection of anti-inflammatory agents for her airway including the use of anti-IL-5 biologic agent.  She will continue to address the issue with reflux as noted above.  We will work through her alpha-gal syndrome in more detail by checking alpha gal titer.  I will contact her with the results of her blood test once they are available for review.  Assuming she does well with this plan I will see her back in this clinic in 6 months or earlier if there is a problem.  Allena Katz, MD Allergy / Immunology [place]

## 2022-05-17 ENCOUNTER — Encounter: Payer: Self-pay | Admitting: Allergy and Immunology

## 2022-05-17 ENCOUNTER — Other Ambulatory Visit: Payer: Self-pay | Admitting: Family Medicine

## 2022-05-17 DIAGNOSIS — Z1231 Encounter for screening mammogram for malignant neoplasm of breast: Secondary | ICD-10-CM

## 2022-05-19 LAB — ALPHA-GAL PANEL
Allergen Lamb IgE: 0.64 kU/L — AB
Beef IgE: 0.7 kU/L — AB
IgE (Immunoglobulin E), Serum: 188 IU/mL (ref 6–495)
O215-IgE Alpha-Gal: 0.77 kU/L — AB
Pork IgE: 0.57 kU/L — AB

## 2022-06-23 ENCOUNTER — Ambulatory Visit: Payer: Medicare HMO

## 2022-06-27 ENCOUNTER — Ambulatory Visit (HOSPITAL_COMMUNITY): Payer: Medicare HMO | Attending: Cardiology

## 2022-06-27 DIAGNOSIS — I2510 Atherosclerotic heart disease of native coronary artery without angina pectoris: Secondary | ICD-10-CM | POA: Insufficient documentation

## 2022-06-27 DIAGNOSIS — I350 Nonrheumatic aortic (valve) stenosis: Secondary | ICD-10-CM | POA: Insufficient documentation

## 2022-06-27 DIAGNOSIS — I10 Essential (primary) hypertension: Secondary | ICD-10-CM | POA: Insufficient documentation

## 2022-06-27 DIAGNOSIS — R600 Localized edema: Secondary | ICD-10-CM | POA: Insufficient documentation

## 2022-06-27 DIAGNOSIS — E785 Hyperlipidemia, unspecified: Secondary | ICD-10-CM | POA: Insufficient documentation

## 2022-06-27 DIAGNOSIS — R002 Palpitations: Secondary | ICD-10-CM

## 2022-06-27 LAB — ECHOCARDIOGRAM COMPLETE
AR max vel: 1.25 cm2
AV Area VTI: 1.34 cm2
AV Area mean vel: 1.28 cm2
AV Mean grad: 11 mmHg
AV Peak grad: 19.2 mmHg
Ao pk vel: 2.19 m/s
Area-P 1/2: 3.06 cm2
S' Lateral: 1.9 cm

## 2022-06-29 ENCOUNTER — Ambulatory Visit: Payer: Self-pay

## 2022-07-03 ENCOUNTER — Ambulatory Visit (INDEPENDENT_AMBULATORY_CARE_PROVIDER_SITE_OTHER): Payer: Medicare HMO

## 2022-07-03 DIAGNOSIS — J4550 Severe persistent asthma, uncomplicated: Secondary | ICD-10-CM

## 2022-07-12 ENCOUNTER — Ambulatory Visit
Admission: RE | Admit: 2022-07-12 | Discharge: 2022-07-12 | Disposition: A | Discharge status: Home / Self Care | Payer: Medicare HMO | Source: Ambulatory Visit | Attending: Family Medicine | Admitting: Family Medicine

## 2022-07-12 DIAGNOSIS — Z1231 Encounter for screening mammogram for malignant neoplasm of breast: Secondary | ICD-10-CM

## 2022-07-12 NOTE — Progress Notes (Signed)
Cardiology Clinic Note   Patient Name: Megan Hull Date of Encounter: 07/14/2022  Primary Care Provider:  [name], DO Primary Cardiologist:  Megan Burow, MD  Patient Profile    Megan Hull 82 year old female presents to the clinic today for an evaluation of her low blood pressure and heart rate.   Past Medical History    Past Medical History:  Diagnosis Date   Arthritis    CHF (congestive heart failure) (HCC)    Diabetes mellitus without complication (HCC)    borderline   Diarrhea    Diverticulosis    Dysrhythmia    controlled with Metoprolol-12-19-13 LOV -Dr. Gwenlyn Hull sees yearly   Family history of premature CAD    GERD (gastroesophageal reflux disease)    Heart murmur    yrs ago   HLD (hyperlipidemia)    pt reports cholesterol normal now (07/21/19).   Hypertension    Lower extremity edema    Status post dilation of esophageal narrowing    Supraventricular arrhythmia    Tobacco abuse    Past Surgical History:  Procedure Laterality Date   ANTERIOR CERVICAL DECOMP/DISCECTOMY FUSION N/A 06/08/2015   Procedure: CERVICAL FIVE-SIX ,CERVICAL SIX-SEVEN ANTERIOR CERVICAL DECOMPRESSION/DISCECTOMY FUSION PLATING BONEGRAFT;  Surgeon: Megan Miss, MD;  Location: [place] [facility];  Service: Neurosurgery;  Laterality: N/A;   CARDIAC CATHETERIZATION  01/15/2009   non-critical CAD, 60% mid LAD lesion (Dr. Adora Hull)   CATARACT EXTRACTION, BILATERAL     most recent 11/19   COLONOSCOPY     ESOPHAGEAL DILATION  2014   infected lymph node surgery  80's   NM MYOCAR PERF WALL MOTION  04/2011   bruce myoview; normal pattern of perfusion, EF 80%, low risk scan   SINUS ENDO WITH FUSION Bilateral 03/04/2020   Procedure: SINUS ENDOSCOPY WITH FUSION NAVIGATION;  Surgeon: Megan Belfast, MD;  Location: [place];  Service: ENT;  Laterality: Bilateral;   TONSILLECTOMY  70's   TRANSTHORACIC ECHOCARDIOGRAM  12/2007   mild conc LVH   TURBINATE REDUCTION  Bilateral 03/04/2020   Procedure: TURBINATE REDUCTION;  Surgeon: Megan Belfast, MD;  Location: [place];  Service: ENT;  Laterality: Bilateral;    Allergies  Allergies  Allergen Reactions   Nexium [Esomeprazole Magnesium] Anaphylaxis    Pt states she was unable to swallow for several hours after taking the nexium   Ace Inhibitors Swelling    Angioedema    Codeine Other (See Comments)    REACTION: chest pain   Monosodium Glutamate Other (See Comments)    dizziness   Other Hives, Swelling and Other (See Comments)    Red Fish (Hives) Bojangle's chicken (swelling and shortness of breath) PAPRIKA   Amlodipine Other (See Comments)    Lower extremity swelling, itching   Latex Rash    Causes skin to turn red per patient    Shellfish Allergy Hives   Tape Rash    Coban wrap turned her arm red (after stress test)    History of Present Illness    [name] has a PMH of essential hypertension, coronary artery disease, chronic diastolic CHF, SVT, esophageal reflux, DJD, dyslipidemia, lower extremity numbness, lower extremity edema, dyspnea, and left foot drop.  Cardiac catheterization 812 showed 60% mid LAD narrowing.  Angioedema secondary to ACE inhibitor.   She was seen by Dr. Gwenlyn Hull 12/20.  An echocardiogram 12/19 and nuclear stress test 1/20 after her complaints of neck pain and dyspnea on exertion showed low risk.  Her echocardiogram showed an LVEF of 60-65%, mild LVH and G1 DD.  She was also evaluated by pulmonary and was not felt to have contributed COPD.   When she last presented to the clinic on 07/30/2019 she brought blood pressure readings that showed consistent elevated systolic blood pressure 782-423 systolic.  She brought her blood pressure monitor from home which showed an increase in systolic pressure by around 40 points.  Her blood pressure in the office was 120s over 60s.  She was grateful to discover this.  She was doing well from a cardiac standpoint.  She  continued to have chronic dyspnea on exertion she had been prescribed albuterol however had not used the medication.  She had not had any recurrent episodes of PSVT.   She presented to the clinic 12/03/2019 for follow-up evaluation and stated she felt well.  She stated that she  had episodes where her blood pressure was in the 115's over 60s and 70s and she had taken her hydralazine which further dropped down her blood pressure and maked her feel not well.  She requested that she have 25 mg tablets to be able to take in the evening when her blood pressure is lower.  She continuee to be very physically active in her church and followed a heart healthy diet.  She brought a blood pressure log  that showed well-controlled blood pressure.  She stated that she had an appointment with Dr. Gwenlyn Hull in August for clearance for her sinus surgery.  I  gave her a prescription for an additional 25 mg of hydralazine, gave her the salty 6 diet sheet, and had her follow-up as scheduled.   She was seen by Dr. Gwenlyn Hull 02/10/2020 in follow-up.  She continued to do well.  She did complain of allergies and sinus drainage.  Her sinus surgery was discussed and deemed to be a safe procedure from a cardiac standpoint.  Her blood pressure was well managed at that time.   She presented to the clinic 08/09/2020 for follow-up evaluation and stated around 1 month ago she had asthma exacerbation that required nebulizer treatments and steroids.  She reported that the treatment greatly improved her breathing.  However currently she was still working to get over her exacerbation.  She was taking nebulizer treatments when she noticed wheezing.  She was managing her blood pressure with her hydralazine.  Her carvedilol was held by her PCP and she was taking 12.5 mg when her heart rate increased to 95.  I agreed with the change at the current time.  I  had her continue her current medication regimen.  She reported that she was going to [place] for the  entire month of May and she would not be available for follow-up until the beginning of June.  I  gave her the salty 6 diet sheet, had her maintain her blood pressure log and follow-up early June.  Planned to repeat her fasting lipid panel at that time.   She presented to the [facility] emergency department 08/28/2020 with complaints of lower extremity swelling.  Her blood pressure was 140/74 with pulse of 67.  Her furosemide was increased to 40 mg twice daily x2 days.  She was then instructed to resume her 40 mg Hull and 20 mg nightly dosing.  Lower extremity compression stockings, low-salt diet were reviewed.  She was given 40 mg IV Lasix x1.  CBC/CMP/troponins unremarkable.  She reported that her symptoms had improved and her neck pain had resolved after IV  diuresis, lidocaine patch, and Tylenol.  She was discharged in stable condition.   She presented to the clinic 11/17/2020 for follow-up evaluation stated she was having more difficulty with routine daily activities.  She reported that she noticed increased shortness of breath with increased physical activity.  Her weight was up about 3 pounds from her dry weight.  She felt that she was not urinating as much with her furosemide.  She continued to follow a heart healthy low-sodium diet, wear her support stockings, and limit her fluid intake.  I will prescribe torsemide 20 mg in a.m. and 10 mg in the p.m.   Her furosemide was stopped.  I  ordered a BMP in 1 week.   She contacted the nurse triage line on 03/01/2021.  She reported having COVID on 8/23.  During the time of the call, she reported low blood pressures with systolic pressures ranging in the 109-112 range with heart rates in the 50s.  I instructed her to decrease her carvedilol to 6.25 mg daily, continue her Imdur, and only take hydralazine for systolic blood pressure greater than 150.   She presented the clinic 03/08/2021 for follow-up evaluation stated she has noticed that her blood pressure was  higher  in the mornings.  Her blood pressure log shows blood pressures starting now in the low 100s and increasing over the weekend to the mid 140s and occasionally in the 160s.  She states that she can tell when her blood pressure is elevated due to a hot left year.  We will increase her carvedilol to 12.5 mg in the Hull and continue her 6.25 mg afternoon/evening dose.    She was seen in follow-up by Dr. Gwenlyn Hull on 11/16/2021.  She was stable from a cardiac standpoint at that time.  She had been hospitalized in March due to questionable TIA.  She was seen by EP for palpitations.  She wore a cardiac event monitor which showed frequent PVCs with a 20% burden.  She was started on mexiletine and did not tolerate the medication.  Mexiletine was discontinued and she was placed on amiodarone.  She was seen in follow-up by Riner C PA-C on 01/18/2022.  She was doing well at that time.  She reported new and frequent stuttering breath.  She denied shortness of breath.  It was not felt that her symptoms were related to amiodarone or cardiac in nature.  She was advised to follow-up with her PCP.  She denied cough, shortness of breath, dyspnea and chest pain.  She presents to the clinic today for follow-up evaluation and states she occasionally notices elevated blood pressures in the mornings.  She notices her blood pressures are more elevated when she retains more fluid.  She was instructed by her PCP to only take 40 mg of furosemide daily due to her increased creatinine.  We reviewed the importance of balancing hydration and medication.  She expressed understanding.  Her blood pressure initially today is 144/62 and on recheck is 134/82.  I will change her hydralazine prescription to 12.5 mg 3 times daily as needed for systolic blood pressure greater than 160.  We will have her maintain her blood pressure log.  She reports that she is traveling to [facility] to stay during [employer] time.  She is excited about the trip.  We reviewed  her echocardiogram.  She expressed understanding.  I will plan follow-up in 6 months, refill her amiodarone and continue her current diet.   Today she denies chest  pain, shortness of breath, lower extremity edema, fatigue, palpitations, melena, hematuria, hemoptysis, diaphoresis, weakness, presyncope, syncope, orthopnea, and PND.   Home Medications    Prior to Admission medications   Medication Sig Start Date End Date Taking? Authorizing Provider  albuterol (VENTOLIN HFA) 108 (90 Base) MCG/ACT inhaler Inhale 2 puffs into the lungs every 4 (four) hours as needed for wheezing or shortness of breath. 05/16/22   Kozlow, Donnamarie Poag, MD  Alum & Mag Hydroxide-Simeth (ANTACID ADVANCED PO) Take 1 tablet by mouth daily as needed (indigestion).     [provider]  amiodarone (PACERONE) 200 MG tablet Take 1 tablet (200 mg total) by mouth daily. 11/25/21   Shirley Friar, PA-C  aspirin EC 81 MG tablet Take 1 tablet (81 mg total) by mouth daily. Swallow whole. 09/03/20   Deberah Pelton, NP  Budeson-Glycopyrrol-Formoterol (BREZTRI AEROSPHERE) 160-9-4.8 MCG/ACT AERO Inhale 2 puffs into the lungs in the Hull and at bedtime. 05/16/22   Kozlow, Donnamarie Poag, MD  budesonide (PULMICORT) 0.25 MG/2ML nebulizer solution Take 2 mLs (0.25 mg total) by nebulization 2 (two) times daily as needed (shortness of breath, wheezing). 01/17/22   Kozlow, Donnamarie Poag, MD  cetirizine (ZYRTEC) 10 MG tablet Take 10 mg by mouth daily.    [provider]  EPINEPHrine 0.3 mg/0.3 mL IJ SOAJ injection Inject 0.3 mg into the muscle as needed (anaphylaxis). 05/16/22   Kozlow, Donnamarie Poag, MD  famotidine (PEPCID) 40 MG tablet Take 1 tablet (40 mg total) by mouth at bedtime. 01/17/22   Kozlow, Donnamarie Poag, MD  FLUAD QUADRIVALENT 0.5 ML injection Inject 0.5 mLs into the muscle once. 03/21/22   [provider]  fluticasone (FLONASE) 50 MCG/ACT nasal spray Place 1 spray into both nostrils daily. 05/16/22   Kozlow, Donnamarie Poag, MD  furosemide  (LASIX) 40 MG tablet Take 1.5 tablets (60 mg total) by mouth daily. 11/16/21   Lorretta Harp, MD  hydrALAZINE (APRESOLINE) 25 MG tablet TAKE 1 TABLET THREE TIMES DAILY AS NEEDED FOR SYSTOLIC BLOOD PRESSURE GREATER THAN 150 (NEED MD APPOINTMENT) 05/03/22   Lorretta Harp, MD  ipratropium-albuterol (DUONEB) 0.5-2.5 (3) MG/3ML SOLN Take 3 mLs by nebulization every 6 (six) hours as needed (wheezing, shortness of breath). 05/16/22   Kozlow, Donnamarie Poag, MD  isosorbide mononitrate (IMDUR) 30 MG 24 hr tablet Take 1 tablet (30 mg total) by mouth daily. 11/16/21   Lorretta Harp, MD  metoprolol succinate (TOPROL-XL) 50 MG 24 hr tablet Take 1 tablet (50 mg total) by mouth at bedtime. Take with or immediately following a meal. 11/16/21   Lorretta Harp, MD  montelukast (SINGULAIR) 10 MG tablet Take 1 tablet (10 mg total) by mouth at bedtime. 05/16/22   Kozlow, Donnamarie Poag, MD  Multiple Vitamins-Minerals (AIRBORNE PO) Take 1 tablet by mouth daily.    [provider]  omeprazole (PRILOSEC) 40 MG capsule Take 1 capsule (40 mg total) by mouth every Hull. 05/16/22   Kozlow, Donnamarie Poag, MD  Probiotic Product (PROBIOTIC DAILY PO) Take 1 capsule by mouth daily.    [provider]  simvastatin (ZOCOR) 40 MG tablet TAKE 1 TABLET EVERY DAY 04/28/22   Lorretta Harp, MD  Spacer/Aero-Holding Chambers (AEROCHAMBER MV) inhaler 1 each by Other route daily. Use as instructed 09/27/21   Kozlow, Donnamarie Poag, MD  [facility] injection Inject 0.5 mLs into the muscle once. 03/31/22   [provider]    Family History    Family History  Problem Relation Age  of Onset   Heart disease Brother        MI @ 55, HTN @ 75, DM @ 4    Heart disease Father        MI   Heart disease Mother        MI, CVA @ 17   Heart disease Sister        CHF, CVA   Breast cancer Sister 50   Esophageal cancer Brother    Alzheimer's disease Sister    Heart disease Brother        MI in 73s, lung cancer   Heart disease Sister     Hypertension Sister        x2   Hyperlipidemia Sister        x2   Diabetes Sister        x2   Cancer Sister    Hypertension Brother    Colon cancer Neg Hx    She indicated that her mother is deceased. She indicated that her father is deceased. She indicated that two of her ten sisters are alive. She indicated that only one of her six brothers is alive. She indicated that her maternal grandmother is deceased. She indicated that her maternal grandfather is deceased. She indicated that her paternal grandmother is deceased. She indicated that her paternal grandfather is deceased. She indicated that the status of her neg hx is unknown.  Social History    Social History   Socioeconomic History   Marital status: Single    Spouse name: Not on file   Number of children: 0   Years of education: 12+   Highest education level: Not on file  Occupational History   Occupation: [employer]  Tobacco Use   Smoking status: Former    Packs/day: 0.25    Years: 15.00    Total pack years: 3.75    Types: Cigarettes    Quit date: 02/04/2016    Years since quitting: 6.4   Smokeless tobacco: Never  Vaping Use   Vaping Use: Never used  Substance and Sexual Activity   Alcohol use: Not Currently    Comment: occasional - tequila shots on holidays   Drug use: No   Sexual activity: Not on file  Other Topics Concern   Not on file  Social History Narrative   Lives alone    Caffeine use: 2 cups coffee per day (mixed decaf/regular)   Social Determinants of Health   Financial Resource Strain: Not on file  Food Insecurity: Not on file  Transportation Needs: Not on file  Physical Activity: Not on file  Stress: Not on file  Social Connections: Not on file  Intimate Partner Violence: Not on file     Review of Systems    General:  No chills, fever, night sweats or weight changes.  Cardiovascular:  No chest pain, dyspnea on exertion, edema, orthopnea, palpitations, paroxysmal nocturnal  dyspnea. Dermatological: No rash, lesions/masses Respiratory: No cough, dyspnea Urologic: No hematuria, dysuria Abdominal:   No nausea, vomiting, diarrhea, bright red blood per rectum, melena, or hematemesis Neurologic:  No visual changes, wkns, changes in mental status. All other systems reviewed and are otherwise negative except as noted above.  Physical Exam    VS:  BP 134/82   Pulse 60   Ht '5\' 5"'$  (1.651 m)   Wt 175 lb 12.8 oz (79.7 kg)   SpO2 97%   BMI 29.25 kg/m  , BMI Body mass index is 29.25 kg/m. GEN: Well nourished, well  developed, in no acute distress. HEENT: normal. Neck: Supple, no JVD, carotid bruits, or masses. Cardiac: RRR, systolic murmur heard best along right sternal border , rubs, or gallops. No clubbing, cyanosis, edema.  Radials/DP/PT 2+ and equal bilaterally.  Respiratory:  Respirations regular and unlabored, clear to auscultation bilaterally. GI: Soft, nontender, nondistended, BS + x 4. MS: no deformity or atrophy. Skin: warm and dry, no rash. Neuro:  Strength and sensation are intact. Psych: Normal affect.  Accessory Clinical Findings    Recent Labs: 09/01/2021: Hemoglobin 13.1; Platelets 216 09/02/2021: Magnesium 2.1 10/17/2021: ALT 15; BUN 15; Creatinine, Ser 1.06; Potassium 4.1; Sodium 139; TSH 0.544   Recent Lipid Panel    Component Value Date/Time   CHOL 116 09/02/2021 0216   TRIG 61 09/02/2021 0216   HDL 41 09/02/2021 0216   CHOLHDL 2.8 09/02/2021 0216   VLDL 12 09/02/2021 0216   LDLCALC 63 09/02/2021 0216         ECG personally reviewed by me today-none today.  Echocardiogram 07/01/2021    1. Left ventricular ejection fraction, by estimation, is 60 to 65%. The  left ventricle has normal function. The left ventricle has no regional  wall motion abnormalities. There is mild asymmetric left ventricular  hypertrophy of the septal and basal  segments. Left ventricular diastolic parameters were normal.   2. Right ventricular systolic  function is normal. The right ventricular  size is normal.   3. Left atrial size was mildly dilated.   4. The mitral valve is degenerative. Trivial mitral valve regurgitation.  No evidence of mitral stenosis.   5. The aortic valve is normal in structure. There is moderate  calcification of the aortic valve. There is moderate thickening of the  aortic valve. Aortic valve regurgitation is trivial. Mild to moderate  aortic valve stenosis.   6. The inferior vena cava is normal in size with greater than 50%  respiratory variability, suggesting right atrial pressure of 3 mmHg.    06/14/2018: stress myoview Nuclear stress EF: 73%. The left ventricular ejection fraction is hyperdynamic (>65%). There was no ST segment deviation noted during stress. The study is normal. This is a low risk study.   Normal stress nuclear study with no ischemia or infarction.  Gated ejection fraction 73% with normal wall motion.    Assessment & Plan   1.  Essential hypertension-BP today 134/82.  Well-controlled at home. Continue isosorbide, carvedilol Reduce hydralazine 12.5 mg 3 times daily as needed for systolic blood pressure greater than 160 Heart healthy low-sodium diet Increase physical activity as tolerated  Chronic diastolic CHF-weight stable.  Euvolemic.  NYHA class I-2.  Echocardiogram 07/01/2021 showed EF of 60-65%.  Details above. Continue current medical therapy Continue fluid restriction Daily weights-contact office with a weight increase of 2 to 3 pounds overnight or 5 pounds in 1 week. Request BMP, CBC, LIpids and lfts  Coronary artery disease-denies chest pain today and recent episodes of chest pressure.  Somewhat physically active.  Cardiac catheterization 2012 showed 60% mid LAD stenosis.  Underwent nuclear stress testing 1/20 which showed low risk and no ischemia. Continue aspirin, Imdur, simvastatin, carvedilol Heart healthy low-sodium diet  Palpitations, PVCs-follows with EP.  Previously  did not tolerate mexiletine and was transitioned to amiodarone.  Tolerating medication well without side effects. 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  She required 48 hours of intubation.  Subsequent allergy testing showed allergy to ARB/ACE and red meat.  Disposition: Follow-up  with Dr.Berry or me in 6 months.   Jossie Ng. Janiylah Hannis NP-C     07/14/2022, 12:05 PM [place] [address] Office [phone] Fax [phone]    I spent 14 minutes examining this patient, reviewing medications, and using patient centered shared decision making involving her cardiac care.  Prior to her visit I spent greater than 20 minutes reviewing her past medical history,  medications, and prior cardiac tests.

## 2022-07-14 ENCOUNTER — Ambulatory Visit: Payer: Medicare HMO | Attending: General Practice | Admitting: General Practice

## 2022-07-14 ENCOUNTER — Encounter: Payer: Self-pay | Admitting: General Practice

## 2022-07-14 VITALS — BP 134/82 | HR 60 | Ht 65.0 in | Wt 175.8 lb

## 2022-07-14 DIAGNOSIS — I5032 Chronic diastolic (congestive) heart failure: Secondary | ICD-10-CM | POA: Diagnosis not present

## 2022-07-14 DIAGNOSIS — R002 Palpitations: Secondary | ICD-10-CM | POA: Diagnosis not present

## 2022-07-14 DIAGNOSIS — I2510 Atherosclerotic heart disease of native coronary artery without angina pectoris: Secondary | ICD-10-CM

## 2022-07-14 DIAGNOSIS — I493 Ventricular premature depolarization: Secondary | ICD-10-CM

## 2022-07-14 DIAGNOSIS — I10 Essential (primary) hypertension: Secondary | ICD-10-CM

## 2022-07-14 MED ORDER — HYDRALAZINE HCL 25 MG PO TABS
12.5000 mg | ORAL_TABLET | Freq: Three times a day (TID) | ORAL | 3 refills | Status: DC | PRN
Start: 2022-07-14 — End: 2023-07-19

## 2022-07-14 MED ORDER — AMIODARONE HCL 200 MG PO TABS: 200.0000 mg | ORAL_TABLET | Freq: Every day | ORAL | 3 refills | Status: DC

## 2022-07-14 NOTE — Patient Instructions (Signed)
Medication Instructions:  Take Hydralazine 12.5 mg (1/2 tablet) up to three times daily as needed for Systolic BP greater than 983   *If you need a refill on your cardiac medications before your next appointment, please call your pharmacy*  Testing/Procedures: None  Follow-Up: At [facility], you and your health needs are our priority.  As part of our continuing mission to provide you with exceptional heart care, we have created designated Provider Care Teams.  These Care Teams include your primary Cardiologist (physician) and Advanced Practice Providers (APPs -  Physician Assistants and Nurse Practitioners) who all work together to provide you with the care you need, when you need it.  We recommend signing up for the patient portal called "MyChart".  Sign up information is provided on this After Visit Summary.  MyChart is used to connect with patients for Virtual Visits (Telemedicine).  Patients are able to view lab/test results, encounter notes, upcoming appointments, etc.  Non-urgent messages can be sent to your provider as well.   To learn more about what you can do with MyChart, go to [email or web address].    Your next appointment:   6 month(s)  Provider:   With Coletta Memos, NP or Dr Gwenlyn Found

## 2022-08-03 DIAGNOSIS — I493 Ventricular premature depolarization: Secondary | ICD-10-CM | POA: Diagnosis not present

## 2022-08-03 DIAGNOSIS — N289 Disorder of kidney and ureter, unspecified: Secondary | ICD-10-CM | POA: Diagnosis not present

## 2022-08-03 DIAGNOSIS — Z13220 Encounter for screening for lipoid disorders: Secondary | ICD-10-CM | POA: Diagnosis not present

## 2022-08-03 DIAGNOSIS — I2510 Atherosclerotic heart disease of native coronary artery without angina pectoris: Secondary | ICD-10-CM | POA: Diagnosis not present

## 2022-08-03 DIAGNOSIS — R946 Abnormal results of thyroid function studies: Secondary | ICD-10-CM | POA: Diagnosis not present

## 2022-08-03 DIAGNOSIS — E1159 Type 2 diabetes mellitus with other circulatory complications: Secondary | ICD-10-CM | POA: Diagnosis not present

## 2022-08-05 LAB — LAB REPORT - SCANNED
A1c: 6.3
Creatinine, POC: 125.5 mg/dL
EGFR: 41
Microalb Creat Ratio: 2
Microalbumin, Urine: 0.3

## 2022-08-10 DIAGNOSIS — R7401 Elevation of levels of liver transaminase levels: Secondary | ICD-10-CM | POA: Diagnosis not present

## 2022-08-10 DIAGNOSIS — I498 Other specified cardiac arrhythmias: Secondary | ICD-10-CM | POA: Diagnosis not present

## 2022-08-10 DIAGNOSIS — Z9109 Other allergy status, other than to drugs and biological substances: Secondary | ICD-10-CM | POA: Diagnosis not present

## 2022-08-10 DIAGNOSIS — Z0000 Encounter for general adult medical examination without abnormal findings: Secondary | ICD-10-CM | POA: Diagnosis not present

## 2022-08-10 DIAGNOSIS — E1159 Type 2 diabetes mellitus with other circulatory complications: Secondary | ICD-10-CM | POA: Diagnosis not present

## 2022-08-10 DIAGNOSIS — I350 Nonrheumatic aortic (valve) stenosis: Secondary | ICD-10-CM | POA: Diagnosis not present

## 2022-08-15 ENCOUNTER — Other Ambulatory Visit: Payer: Self-pay | Admitting: *Deleted

## 2022-08-15 MED ORDER — FASENRA 30 MG/ML ~~LOC~~ SOSY: 30.0000 mg | PREFILLED_SYRINGE | SUBCUTANEOUS | 6 refills | Status: DC

## 2022-08-17 DIAGNOSIS — N8110 Cystocele, unspecified: Secondary | ICD-10-CM | POA: Diagnosis not present

## 2022-08-17 DIAGNOSIS — N950 Postmenopausal bleeding: Secondary | ICD-10-CM | POA: Diagnosis not present

## 2022-08-17 DIAGNOSIS — Z01419 Encounter for gynecological examination (general) (routine) without abnormal findings: Secondary | ICD-10-CM | POA: Diagnosis not present

## 2022-08-17 DIAGNOSIS — Z124 Encounter for screening for malignant neoplasm of cervix: Secondary | ICD-10-CM | POA: Diagnosis not present

## 2022-08-17 DIAGNOSIS — R61 Generalized hyperhidrosis: Secondary | ICD-10-CM | POA: Diagnosis not present

## 2022-08-18 DIAGNOSIS — E119 Type 2 diabetes mellitus without complications: Secondary | ICD-10-CM | POA: Diagnosis not present

## 2022-08-18 DIAGNOSIS — H04123 Dry eye syndrome of bilateral lacrimal glands: Secondary | ICD-10-CM | POA: Diagnosis not present

## 2022-08-18 DIAGNOSIS — Z961 Presence of intraocular lens: Secondary | ICD-10-CM | POA: Diagnosis not present

## 2022-08-18 DIAGNOSIS — H0102A Squamous blepharitis right eye, upper and lower eyelids: Secondary | ICD-10-CM | POA: Diagnosis not present

## 2022-08-18 DIAGNOSIS — H0102B Squamous blepharitis left eye, upper and lower eyelids: Secondary | ICD-10-CM | POA: Diagnosis not present

## 2022-08-18 DIAGNOSIS — H40013 Open angle with borderline findings, low risk, bilateral: Secondary | ICD-10-CM | POA: Diagnosis not present

## 2022-08-18 DIAGNOSIS — H1013 Acute atopic conjunctivitis, bilateral: Secondary | ICD-10-CM | POA: Diagnosis not present

## 2022-08-22 DIAGNOSIS — N950 Postmenopausal bleeding: Secondary | ICD-10-CM | POA: Diagnosis not present

## 2022-08-29 ENCOUNTER — Ambulatory Visit (INDEPENDENT_AMBULATORY_CARE_PROVIDER_SITE_OTHER): Payer: Medicare HMO

## 2022-08-29 DIAGNOSIS — J4550 Severe persistent asthma, uncomplicated: Secondary | ICD-10-CM | POA: Diagnosis not present

## 2022-09-04 ENCOUNTER — Ambulatory Visit: Payer: Medicare HMO | Attending: Cardiology | Admitting: Cardiology

## 2022-09-04 ENCOUNTER — Encounter: Payer: Self-pay | Admitting: Cardiology

## 2022-09-04 VITALS — BP 114/70 | HR 57 | Ht 65.0 in | Wt 175.0 lb

## 2022-09-04 DIAGNOSIS — I493 Ventricular premature depolarization: Secondary | ICD-10-CM

## 2022-09-04 DIAGNOSIS — Z79899 Other long term (current) drug therapy: Secondary | ICD-10-CM | POA: Diagnosis not present

## 2022-09-04 MED ORDER — AMIODARONE HCL 200 MG PO TABS: 100.0000 mg | ORAL_TABLET | Freq: Every day | ORAL | 3 refills | Status: DC

## 2022-09-04 NOTE — Progress Notes (Signed)
Electrophysiology Office Note   Date:  09/04/2022   ID:  Megan Hull, Megan Hull [birth year], MRN [id number]  PCP:  [name], DO  Cardiologist: Gwenlyn Found Primary Electrophysiologist:  Sander Remedios Meredith Leeds, MD    Chief Complaint: PVCs   History of Present Illness: Megan Hull is a 82 y.o. 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 Presenting today for electrophysiology evaluation.  She has a history seen for diabetes, hypertension, hyperlipidemia, coronary artery disease with 60% LAD lesion, PVCs.  She was having more consistent palpitations.  She wore a cardiac monitor that showed a 20% PVC burden.  She has intermittent fatigue and shortness of breath.  She has since been started on mexiletine.  Today, denies symptoms of palpitations, chest pain, shortness of breath, orthopnea, PND, lower extremity edema, claudication, dizziness, presyncope, syncope, bleeding, or neurologic sequela. The patient is tolerating medications without difficulties.  Being seen she has done well.  She has noted no further PVCs.  Happy with her control.  She did have recent labs done with her primary physician that showed a very mildly elevated liver function tests.  Aside from that no complaints.     Past Medical History:  Diagnosis Date   Arthritis    CHF (congestive heart failure) (HCC)    Diabetes mellitus without complication (HCC)    borderline   Diarrhea    Diverticulosis    Dysrhythmia    controlled with Metoprolol-12-19-13 LOV -Dr. Gwenlyn Found sees yearly   Family history of premature CAD    GERD (gastroesophageal reflux disease)    Heart murmur    yrs ago   HLD (hyperlipidemia)    pt reports cholesterol normal now (07/21/19).   Hypertension    Lower extremity edema    Status post dilation of esophageal narrowing    Supraventricular arrhythmia    Tobacco abuse    Past Surgical History:  Procedure Laterality Date   ANTERIOR CERVICAL  DECOMP/DISCECTOMY FUSION N/A 06/08/2015   Procedure: CERVICAL FIVE-SIX ,CERVICAL SIX-SEVEN ANTERIOR CERVICAL DECOMPRESSION/DISCECTOMY FUSION PLATING BONEGRAFT;  Surgeon: Kristeen Miss, MD;  Location: [facility];  Service: Neurosurgery;  Laterality: N/A;   CARDIAC CATHETERIZATION  01/15/2009   non-critical CAD, 60% mid LAD lesion (Dr. Adora Fridge)   CATARACT EXTRACTION, BILATERAL     most recent 11/19   COLONOSCOPY     ESOPHAGEAL DILATION  2014   infected lymph node surgery  80's   NM MYOCAR PERF WALL MOTION  04/2011   bruce myoview; normal pattern of perfusion, EF 80%, low risk scan   SINUS ENDO WITH FUSION Bilateral 03/04/2020   Procedure: SINUS ENDOSCOPY WITH FUSION NAVIGATION;  Surgeon: Jerrell Belfast, MD;  Location: [facility];  Service: ENT;  Laterality: Bilateral;   TONSILLECTOMY  70's   TRANSTHORACIC ECHOCARDIOGRAM  12/2007   mild conc LVH   TURBINATE REDUCTION Bilateral 03/04/2020   Procedure: TURBINATE REDUCTION;  Surgeon: Jerrell Belfast, MD;  Location: [facility];  Service: ENT;  Laterality: Bilateral;     Current Outpatient Medications  Medication Sig Dispense Refill   albuterol (VENTOLIN HFA) 108 (90 Base) MCG/ACT inhaler Inhale 2 puffs into the lungs every 4 (four) hours as needed for wheezing or shortness of breath. 18 g 1   Alum & Mag Hydroxide-Simeth (ANTACID ADVANCED PO) Take 1 tablet by mouth daily as needed (indigestion).      amiodarone (PACERONE) 200 MG tablet Take 0.5 tablets (100 mg total)  by mouth daily. 45 tablet 3   aspirin EC 81 MG tablet Take 1 tablet (81 mg total) by mouth daily. Swallow whole. 90 tablet 3   Benralizumab (FASENRA) 30 MG/ML SOSY Inject 1 mL (30 mg total) into the skin every 8 (eight) weeks. 1 mL 6   Budeson-Glycopyrrol-Formoterol (BREZTRI AEROSPHERE) 160-9-4.8 MCG/ACT AERO Inhale 2 puffs into the lungs in the morning and at bedtime. 32.1 g 1   budesonide (PULMICORT) 0.25 MG/2ML nebulizer solution Take 2 mLs (0.25 mg  total) by nebulization 2 (two) times daily as needed (shortness of breath, wheezing). 120 mL 1   cetirizine (ZYRTEC) 10 MG tablet Take 10 mg by mouth daily.     EPINEPHrine 0.3 mg/0.3 mL IJ SOAJ injection Inject 0.3 mg into the muscle as needed (anaphylaxis). 2 each 1   famotidine (PEPCID) 40 MG tablet Take 1 tablet (40 mg total) by mouth at bedtime. 90 tablet 1   FLUAD QUADRIVALENT 0.5 ML injection Inject 0.5 mLs into the muscle once.     fluticasone (FLONASE) 50 MCG/ACT nasal spray Place 1 spray into both nostrils daily. 48 g 1   furosemide (LASIX) 40 MG tablet Take 1.5 tablets (60 mg total) by mouth daily. 135 tablet 4   hydrALAZINE (APRESOLINE) 25 MG tablet Take 0.5 tablets (12.5 mg total) by mouth 3 (three) times daily as needed (12.5 mg up to three times daily as needed for Systolic BP greater than 0000000). 90 tablet 3   ipratropium-albuterol (DUONEB) 0.5-2.5 (3) MG/3ML SOLN Take 3 mLs by nebulization every 6 (six) hours as needed (wheezing, shortness of breath). 150 mL 1   isosorbide mononitrate (IMDUR) 30 MG 24 hr tablet Take 1 tablet (30 mg total) by mouth daily. 90 tablet 4   metoprolol succinate (TOPROL-XL) 50 MG 24 hr tablet Take 1 tablet (50 mg total) by mouth at bedtime. Take with or immediately following a meal. 90 tablet 4   montelukast (SINGULAIR) 10 MG tablet Take 1 tablet (10 mg total) by mouth at bedtime. 90 tablet 1   Multiple Vitamins-Minerals (AIRBORNE PO) Take 1 tablet by mouth daily.     omeprazole (PRILOSEC) 40 MG capsule Take 1 capsule (40 mg total) by mouth every morning. 90 capsule 1   Probiotic Product (PROBIOTIC DAILY PO) Take 1 capsule by mouth daily.     simvastatin (ZOCOR) 40 MG tablet TAKE 1 TABLET EVERY DAY 90 tablet 3   Spacer/Aero-Holding Chambers (AEROCHAMBER MV) inhaler 1 each by Other route daily. Use as instructed 1 each 1   SPIKEVAX injection Inject 0.5 mLs into the muscle once.     Current Facility-Administered Medications  Medication Dose Route Frequency  Provider Last Rate Last Admin   Benralizumab SOSY 30 mg  30 mg Subcutaneous Q8 weeks Kennith Gain, MD   30 mg at 08/29/22 1050    Allergies:   Nexium [esomeprazole magnesium], Ace inhibitors, Codeine, Monosodium glutamate, Other, Amlodipine, Latex, Shellfish allergy, and Tape   Social History:  The patient  reports that she quit smoking about 6 years ago. Her smoking use included cigarettes. She has a 3.75 pack-year smoking history. She has never used smokeless tobacco. She reports that she does not currently use alcohol. She reports that she does not use drugs.   Family History:  The patient's family history includes Alzheimer's disease in her sister; Breast cancer (age of onset: 32) in her sister; Cancer in her sister; Diabetes in her sister; Esophageal cancer in her brother; Heart disease in her brother, brother,  father, mother, sister, and sister; Hyperlipidemia in her sister; Hypertension in her brother and sister.   ROS:  Please see the history of present illness.   Otherwise, review of systems is positive for none.   All other systems are reviewed and negative.   PHYSICAL EXAM: VS:  BP 114/70   Pulse (!) 57   Ht 5\' 5"  (1.651 m)   Wt 175 lb (79.4 kg)   SpO2 98%   BMI 29.12 kg/m  , BMI Body mass index is 29.12 kg/m. GEN: Well nourished, well developed, in no acute distress  HEENT: normal  Neck: no JVD, carotid bruits, or masses Cardiac: RRR; no murmurs, rubs, or gallops,no edema  Respiratory:  clear to auscultation bilaterally, normal work of breathing GI: soft, nontender, nondistended, + BS MS: no deformity or atrophy  Skin: warm and dry Neuro:  Strength and sensation are intact Psych: euthymic mood, full affect  EKG:  EKG is ordered today. Personal review of the ekg ordered shows sinus rhythm   Recent Labs: 10/17/2021: ALT 15; BUN 15; Creatinine, Ser 1.06; Potassium 4.1; Sodium 139; TSH 0.544    Lipid Panel     Component Value Date/Time   CHOL 116  09/02/2021 0216   TRIG 61 09/02/2021 0216   HDL 41 09/02/2021 0216   CHOLHDL 2.8 09/02/2021 0216   VLDL 12 09/02/2021 0216   LDLCALC 63 09/02/2021 0216     Wt Readings from Last 3 Encounters:  09/04/22 175 lb (79.4 kg)  07/14/22 175 lb 12.8 oz (79.7 kg)  05/16/22 176 lb 6.4 oz (80 kg)      Other studies Reviewed: Additional studies/ records that were reviewed today include: TTE 07/01/21  Review of the above records today demonstrates:   1. Left ventricular ejection fraction, by estimation, is 60 to 65%. The  left ventricle has normal function. The left ventricle has no regional  wall motion abnormalities. There is mild asymmetric left ventricular  hypertrophy of the septal and basal  segments. Left ventricular diastolic parameters were normal.   2. Right ventricular systolic function is normal. The right ventricular  size is normal.   3. Left atrial size was mildly dilated.   4. The mitral valve is degenerative. Trivial mitral valve regurgitation.  No evidence of mitral stenosis.   5. The aortic valve is normal in structure. There is moderate  calcification of the aortic valve. There is moderate thickening of the  aortic valve. Aortic valve regurgitation is trivial. Mild to moderate  aortic valve stenosis.   6. The inferior vena cava is normal in size with greater than 50%  respiratory variability, suggesting right atrial pressure of 3 mmHg.   Cardiac monitor 07/10/2021 personally reviewed 1. SR/SB/ST 2. Runs of SVT 3. Freq PVCs (20%)  ASSESSMENT AND PLAN:  1.  PVCs: 20% burden on cardiac monitor.  Currently on amiodarone.  No further PVCs today.  She is currently feeling well.  Her LFTs were very mildly elevated.  Jarry Manon reduce amiodarone to 100 mg daily and recheck LFTs in 1 month.  2.  Coronary artery disease: Has a 60% LAD lesion.  No current chest pain.  Continue with current management.  3.  Hypertension: Currently well-controlled  4.  High risk medication  monitoring: Currently on amiodarone.  Checking LFTs in 1 month as above.  Current medicines are reviewed at length with the patient today.   The patient does not have concerns regarding her medicines.  The following changes were made today: None  Labs/ tests ordered today include:  Orders Placed This Encounter  Procedures   Comp Met (CMET)   EKG 12-Lead     Disposition:   FU 6 months  Signed, Ranald Alessio Meredith Leeds, MD  09/04/2022 12:07 PM     [place] [address] [place] [address] [phone] (office) [phone] (fax)

## 2022-09-04 NOTE — Patient Instructions (Addendum)
Medication Instructions:  Your physician has recommended you make the following change in your medication:  DECREASE Amiodarone to 100 mg once a day  *If you need a refill on your cardiac medications before your next appointment, please call your pharmacy*   Lab Work: Your physician recommends that you return for lab work in: one month for a CMET, on 10/03/22.  You do NOT need to be fasting.  If you have labs (blood work) drawn today and your tests are completely normal, you will receive your results only by: [place] (if you have MyChart) OR A paper copy in the mail If you have any lab test that is abnormal or we need to change your treatment, we will call you to review the results.   Testing/Procedures: None ordered   Follow-Up: At [facility], you and your health needs are our priority.  As part of our continuing mission to provide you with exceptional heart care, we have created designated Provider Care Teams.  These Care Teams include your primary Cardiologist (physician) and Advanced Practice Providers (APPs -  Physician Assistants and Nurse Practitioners) who all work together to provide you with the care you need, when you need it.  Your next appointment:   6 month(s)  The format for your next appointment:   In Person  Provider:   You will see one of the following Advanced Practice Providers on your designated Care Team:   Tommye Standard, [place] Legrand Como "Oda Kilts, [place] {   Thank you for choosing [facility]!!   Trinidad Curet, RN [phone]

## 2022-09-11 DIAGNOSIS — S76012A Strain of muscle, fascia and tendon of left hip, initial encounter: Secondary | ICD-10-CM | POA: Diagnosis not present

## 2022-09-11 DIAGNOSIS — J449 Chronic obstructive pulmonary disease, unspecified: Secondary | ICD-10-CM | POA: Diagnosis not present

## 2022-09-14 DIAGNOSIS — M25562 Pain in left knee: Secondary | ICD-10-CM | POA: Diagnosis not present

## 2022-09-14 DIAGNOSIS — R1030 Lower abdominal pain, unspecified: Secondary | ICD-10-CM | POA: Diagnosis not present

## 2022-09-14 DIAGNOSIS — M7918 Myalgia, other site: Secondary | ICD-10-CM | POA: Diagnosis not present

## 2022-10-03 ENCOUNTER — Ambulatory Visit: Payer: Medicare HMO | Attending: Cardiology

## 2022-10-03 DIAGNOSIS — Z79899 Other long term (current) drug therapy: Secondary | ICD-10-CM

## 2022-10-03 DIAGNOSIS — I493 Ventricular premature depolarization: Secondary | ICD-10-CM

## 2022-10-04 LAB — COMPREHENSIVE METABOLIC PANEL
ALT: 37 IU/L — ABNORMAL HIGH (ref 0–32)
AST: 30 IU/L (ref 0–40)
Albumin/Globulin Ratio: 1.6 (ref 1.2–2.2)
Albumin: 3.9 g/dL (ref 3.7–4.7)
Alkaline Phosphatase: 80 IU/L (ref 44–121)
BUN/Creatinine Ratio: 14 (ref 12–28)
BUN: 17 mg/dL (ref 8–27)
Bilirubin Total: 0.7 mg/dL (ref 0.0–1.2)
CO2: 23 mmol/L (ref 20–29)
Calcium: 8.8 mg/dL (ref 8.7–10.3)
Chloride: 102 mmol/L (ref 96–106)
Creatinine, Ser: 1.24 mg/dL — ABNORMAL HIGH (ref 0.57–1.00)
Globulin, Total: 2.4 g/dL (ref 1.5–4.5)
Glucose: 161 mg/dL — ABNORMAL HIGH (ref 70–99)
Potassium: 4.2 mmol/L (ref 3.5–5.2)
Sodium: 142 mmol/L (ref 134–144)
Total Protein: 6.3 g/dL (ref 6.0–8.5)
eGFR: 43 mL/min/{1.73_m2} — ABNORMAL LOW (ref >59)

## 2022-10-24 ENCOUNTER — Ambulatory Visit (INDEPENDENT_AMBULATORY_CARE_PROVIDER_SITE_OTHER): Payer: Medicare HMO

## 2022-10-24 DIAGNOSIS — J4550 Severe persistent asthma, uncomplicated: Secondary | ICD-10-CM

## 2022-11-04 ENCOUNTER — Other Ambulatory Visit: Payer: Self-pay | Admitting: Allergy and Immunology

## 2022-11-21 ENCOUNTER — Ambulatory Visit: Payer: Medicare HMO | Admitting: Allergy and Immunology

## 2022-11-21 VITALS — BP 122/56 | HR 63 | Temp 97.8°F | Resp 16 | Ht 65.0 in | Wt 176.1 lb

## 2022-11-21 DIAGNOSIS — J339 Nasal polyp, unspecified: Secondary | ICD-10-CM

## 2022-11-21 DIAGNOSIS — T7800XD Anaphylactic reaction due to unspecified food, subsequent encounter: Secondary | ICD-10-CM

## 2022-11-21 DIAGNOSIS — T7800XA Anaphylactic reaction due to unspecified food, initial encounter: Secondary | ICD-10-CM

## 2022-11-21 DIAGNOSIS — J4550 Severe persistent asthma, uncomplicated: Secondary | ICD-10-CM | POA: Diagnosis not present

## 2022-11-21 DIAGNOSIS — J3089 Other allergic rhinitis: Secondary | ICD-10-CM

## 2022-11-21 DIAGNOSIS — K219 Gastro-esophageal reflux disease without esophagitis: Secondary | ICD-10-CM | POA: Diagnosis not present

## 2022-11-21 DIAGNOSIS — J324 Chronic pansinusitis: Secondary | ICD-10-CM | POA: Diagnosis not present

## 2022-11-21 MED ORDER — FLUTICASONE PROPIONATE 50 MCG/ACT NA SUSP: 1.0000 | Freq: Every day | NASAL | 1 refills | Status: DC

## 2022-11-21 MED ORDER — BREZTRI AEROSPHERE 160-9-4.8 MCG/ACT IN AERO: INHALATION_SPRAY | RESPIRATORY_TRACT | 1 refills | Status: DC

## 2022-11-21 MED ORDER — OMEPRAZOLE 40 MG PO CPDR: DELAYED_RELEASE_CAPSULE | ORAL | 1 refills | Status: DC

## 2022-11-21 MED ORDER — CETIRIZINE HCL 10 MG PO TABS: ORAL_TABLET | ORAL | 1 refills | Status: DC

## 2022-11-21 MED ORDER — MONTELUKAST SODIUM 10 MG PO TABS: 10.0000 mg | ORAL_TABLET | Freq: Every evening | ORAL | 1 refills | Status: DC

## 2022-11-21 MED ORDER — ALBUTEROL SULFATE HFA 108 (90 BASE) MCG/ACT IN AERS: 2.0000 | INHALATION_SPRAY | RESPIRATORY_TRACT | 1 refills | Status: DC | PRN

## 2022-11-21 NOTE — Patient Instructions (Signed)
  1.  Allergen avoidance measures - dust mite, cat, dog, mammal consumption  2.  Continue to treat and prevent inflammation:  A. Breztri - 2 inhalations 1-2 times per day w/spacer (empty lungs) B. Flonase - 2 sprays each nostril 1-7 times per week C. Montelukast 10 mg - 1 tablet 1 time per day D. Benralizumab injections   3.  Continue to treat and prevent reflux/LPR:  A. Omeprazole 40 mg - 1 tablet 3-7 times per week  4.  If needed:  A. Albuterol MDI or nebulization B. Nasal saline C. Antihistamine D. Epi-Pen  5.  Return to clinic in 6 months or earlier if problem  6. Plan for fall flu vaccine

## 2022-11-21 NOTE — Progress Notes (Unsigned)
[place] - [place] - [place] - [place] - [place]   Follow-up Note  Referring Provider: Irena Reichmann, DO Primary Provider: Irena Reichmann, DO Date of Office Visit: 11/21/2022  Subjective:   Megan Hull (DOB: June 16, 1940) is a 82 y.o. female who [facility] in re-evaluation of the following:  HPI: Megan Hull returns to this clinic in evaluation of asthma, allergic rhinitis, chronic sinusitis, nasal polyposis, LPR, alpha gal syndrome.  I last saw her in this clinic 16 May 2022.  She has had an excellent interval of time regarding her respiratory tract without the need for systemic steroid or antibiotic and rare use of a short acting bronchodilator and she has been able to taper down her Breztri to just 1 time per day while she continues on benralizumab injections.  She has had very little problems with her upper airways while continuing on a nasal steroid.  She had very little problems with reflux while using omeprazole every day.  She remains away from consumption of mammal.  Allergies as of 11/21/2022       Reactions   Nexium [esomeprazole Magnesium] Anaphylaxis   Pt states she was unable to swallow for several hours after taking the nexium   Ace Inhibitors Swelling   Angioedema   Codeine Other (See Comments)   REACTION: chest pain   Monosodium Glutamate Other (See Comments)   dizziness   Other Hives, Swelling, Other (See Comments)   Red Fish (Hives) Bojangle's chicken (swelling and shortness of breath) PAPRIKA   Amlodipine Other (See Comments)   Lower extremity swelling, itching   Latex Rash   Causes skin to turn red per patient    Shellfish Allergy Hives   Tape Rash   Coban wrap turned her arm red (after stress test)        Medication List    AeroChamber MV inhaler 1 each by Other route daily. Use as instructed   albuterol 108 (90 Base) MCG/ACT inhaler Commonly known as: VENTOLIN HFA Inhale 2  puffs into the lungs every 4 (four) hours as needed for wheezing or shortness of breath.   amiodarone 200 MG tablet Commonly known as: PACERONE Take 0.5 tablets (100 mg total) by mouth daily.   aspirin EC 81 MG tablet Take 1 tablet (81 mg total) by mouth daily. Swallow whole.   Breztri Aerosphere 160-9-4.8 MCG/ACT Aero Generic drug: Budeson-Glycopyrrol-Formoterol 2 puffs 1-2 times per day   budesonide 0.25 MG/2ML nebulizer solution Commonly known as: PULMICORT Take 2 mLs (0.25 mg total) by nebulization 2 (two) times daily as needed (shortness of breath, wheezing).   cetirizine 10 MG tablet Commonly known as: ZYRTEC TAKE 1 TABLET 2 (TWO) TIMES DAILY AS NEEDED FOR ALLERGIES (CAN USE AN EXTRA DOSE DURING FLARE UPS.)   EPINEPHrine 0.3 mg/0.3 mL Soaj injection Commonly known as: EPI-PEN Inject 0.3 mg into the muscle as needed (anaphylaxis).   famotidine 40 MG tablet Commonly known as: PEPCID TAKE 1 TABLET AT BEDTIME   Fasenra 30 MG/ML Sosy Generic drug: Benralizumab Inject 1 mL (30 mg total) into the skin every 8 (eight) weeks.   Fluad Quadrivalent 0.5 ML injection Generic drug: influenza vaccine adjuvanted Inject 0.5 mLs into the muscle once.   fluticasone 50 MCG/ACT nasal spray Commonly known as: Flonase Place 1 spray into both nostrils daily.   furosemide 40 MG tablet Commonly known as: Lasix Take 1.5 tablets (60 mg total) by mouth daily.   hydrALAZINE 25 MG tablet Commonly known  as: APRESOLINE Take 0.5 tablets (12.5 mg total) by mouth 3 (three) times daily as needed (12.5 mg up to three times daily as needed for Systolic BP greater than 160).   ipratropium-albuterol 0.5-2.5 (3) MG/3ML Soln Commonly known as: DUONEB Take 3 mLs by nebulization every 6 (six) hours as needed (wheezing, shortness of breath).   metoprolol succinate 50 MG 24 hr tablet Commonly known as: TOPROL-XL Take 1 tablet (50 mg total) by mouth at bedtime. Take with or immediately following a  meal.   montelukast 10 MG tablet Commonly known as: SINGULAIR Take 1 tablet (10 mg total) by mouth at bedtime.   omeprazole 40 MG capsule Commonly known as: PRILOSEC 1 tablet 3-7 times per week.   PROBIOTIC DAILY PO Take 1 capsule by mouth daily.   simvastatin 40 MG tablet Commonly known as: ZOCOR TAKE 1 TABLET EVERY DAY   Spikevax injection Generic drug: COVID-19 mRNA vaccine (Moderna, >/= 37yrs) Inject 0.5 mLs into the muscle once.    Past Medical History:  Diagnosis Date   Arthritis    CHF (congestive heart failure) (HCC)    Diabetes mellitus without complication (HCC)    borderline   Diarrhea    Diverticulosis    Dysrhythmia    controlled with Metoprolol-12-19-13 LOV -Dr. Allyson Sabal sees yearly   Family history of premature CAD    GERD (gastroesophageal reflux disease)    Heart murmur    yrs ago   HLD (hyperlipidemia)    pt reports cholesterol normal now (07/21/19).   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 Erlene Quan)   CATARACT EXTRACTION, BILATERAL     most recent 11/19   COLONOSCOPY     ESOPHAGEAL DILATION  2014   infected lymph node surgery  80's   NM MYOCAR PERF WALL MOTION  04/2011   bruce myoview; normal pattern of perfusion, EF 80%, low risk scan   SINUS ENDO WITH FUSION Bilateral 03/04/2020   Procedure: SINUS ENDOSCOPY WITH FUSION NAVIGATION;  Surgeon: Osborn Coho, MD;  Location: [facility];  Service: ENT;  Laterality: Bilateral;    TONSILLECTOMY  70's   TRANSTHORACIC ECHOCARDIOGRAM  12/2007   mild conc LVH   TURBINATE REDUCTION Bilateral 03/04/2020   Procedure: TURBINATE REDUCTION;  Surgeon: Osborn Coho, MD;  Location: [facility];  Service: ENT;  Laterality: Bilateral;    Review of systems negative except as noted in HPI / PMHx or noted below:  Review of Systems  Constitutional: Negative.   HENT: Negative.    Eyes: Negative.   Respiratory: Negative.    Cardiovascular: Negative.   Gastrointestinal: Negative.   Genitourinary: Negative.   Musculoskeletal: Negative.   Skin: Negative.   Neurological: Negative.   Endo/Heme/Allergies: Negative.   Psychiatric/Behavioral: Negative.       Objective:   Vitals:   11/21/22 1013  BP: (!) 122/56  Pulse: 63  Resp: 16  Temp: 97.8 F (36.6 C)  SpO2: 96%   Height: 5\' 5"  (165.1 cm)  Weight: 176 lb 1.6 oz (  79.9 kg)   Physical Exam Constitutional:      Appearance: She is not diaphoretic.  HENT:     Head: Normocephalic.     Right Ear: Tympanic membrane, ear canal and external ear normal.     Left Ear: Tympanic membrane, ear canal and external ear normal.     Nose: Nose normal. No mucosal edema or rhinorrhea.     Mouth/Throat:     Pharynx: Uvula midline. No oropharyngeal exudate.  Eyes:     Conjunctiva/sclera: Conjunctivae normal.  Neck:     Thyroid: No thyromegaly.     Trachea: Trachea normal. No tracheal tenderness or tracheal deviation.  Cardiovascular:     Rate and Rhythm: Normal rate and regular rhythm.     Heart sounds: Normal heart sounds, S1 normal and S2 normal. No murmur heard. Pulmonary:     Effort: No respiratory distress.     Breath sounds: Normal breath sounds. No stridor. No wheezing or rales.  Lymphadenopathy:     Head:     Right side of head: No tonsillar adenopathy.     Left side of head: No tonsillar adenopathy.     Cervical: No cervical adenopathy.  Skin:    Findings: No erythema or rash.     Nails: There is no  clubbing.  Neurological:     Mental Status: She is alert.     Diagnostics:    Spirometry was performed and demonstrated an FEV1 of 1.40 at 84 % of predicted.  Results of blood tests obtained 16 May 2022 identified alpha gal IgE 0.77 MGs/DL, pork 2.53 MGs/DL, beef 6.64 Mg/DL, lamb 4.03 Mg/DL.  Assessment and Plan:   1. Asthma, severe persistent, well-controlled   2. Perennial allergic rhinitis   3. Chronic pansinusitis   4. 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 LPRD (laryngopharyngeal reflux disease)     1.  Allergen avoidance measures - dust mite, cat, dog, mammal consumption  2.  Continue to treat and prevent inflammation:  A. Breztri - 2 inhalations 1-2 times per day w/spacer (empty lungs) B. Flonase - 2 sprays each nostril 1-7 times per week C. Montelukast 10 mg - 1 tablet 1 time per day D. Benralizumab injections   3.  Continue to treat and prevent reflux/LPR:  A. Omeprazole 40 mg - 1 tablet 3-7 times per week  4.  If needed:  A. Albuterol MDI or nebulization B. Nasal saline C. Antihistamine D. Epi-Pen  5.  Return to clinic in 6 months or earlier if problem  6. Plan for fall flu vaccine  Kyndle appears to be doing quite well while using benralizumab and a collection of other anti-inflammatory agents to manage her respiratory tract inflammatory condition and we will keep her on this plan at this point in time.  As well her reflux is under good control while using omeprazole and she has the opportunity to consolidate the use of omeprazole aiming for about 3 times per week.  I will see her back in this clinic in approximately 6 months or earlier if there is a problem.  Laurette Schimke, MD Allergy / Immunology [facility]

## 2022-11-22 ENCOUNTER — Encounter: Payer: Self-pay | Admitting: Allergy and Immunology

## 2022-12-19 ENCOUNTER — Ambulatory Visit (INDEPENDENT_AMBULATORY_CARE_PROVIDER_SITE_OTHER): Payer: Medicare HMO | Admitting: *Deleted

## 2022-12-19 DIAGNOSIS — J4550 Severe persistent asthma, uncomplicated: Secondary | ICD-10-CM

## 2023-01-12 ENCOUNTER — Encounter: Payer: Self-pay | Admitting: Cardiovascular Disease

## 2023-01-12 ENCOUNTER — Ambulatory Visit: Payer: Medicare HMO | Attending: Cardiovascular Disease | Admitting: Cardiovascular Disease

## 2023-01-12 VITALS — BP 102/64 | HR 67 | Ht 65.0 in | Wt 177.4 lb

## 2023-01-12 DIAGNOSIS — I2510 Atherosclerotic heart disease of native coronary artery without angina pectoris: Secondary | ICD-10-CM | POA: Diagnosis not present

## 2023-01-12 DIAGNOSIS — R7989 Other specified abnormal findings of blood chemistry: Secondary | ICD-10-CM | POA: Diagnosis not present

## 2023-01-12 DIAGNOSIS — R002 Palpitations: Secondary | ICD-10-CM | POA: Diagnosis not present

## 2023-01-12 DIAGNOSIS — I350 Nonrheumatic aortic (valve) stenosis: Secondary | ICD-10-CM

## 2023-01-12 DIAGNOSIS — E785 Hyperlipidemia, unspecified: Secondary | ICD-10-CM | POA: Diagnosis not present

## 2023-01-12 MED ORDER — SIMVASTATIN 40 MG PO TABS
40.0000 mg | ORAL_TABLET | Freq: Every day | ORAL | 3 refills | Status: DC
Start: 2023-01-12 — End: 2023-07-19

## 2023-01-12 MED ORDER — FUROSEMIDE 40 MG PO TABS
ORAL_TABLET | ORAL | 3 refills | Status: DC
Start: 2023-01-12 — End: 2023-07-19

## 2023-01-12 NOTE — Assessment & Plan Note (Signed)
Mild to moderate aortic stenosis by 2D echo performed 06/27/2022 with normal LV systolic function and grade 1 diastolic dysfunction.  Will repeat this on annual basis.

## 2023-01-12 NOTE — Addendum Note (Signed)
Addended by: Keeghan Bialy,  C on: 01/12/2023 12:23 PM   Modules accepted: Orders

## 2023-01-12 NOTE — Assessment & Plan Note (Addendum)
History of essential hypertension blood pressure measured today at 102/64.  She is on metoprolol, and hydralazine.

## 2023-01-12 NOTE — Assessment & Plan Note (Signed)
History of palpitations with a monitor performed 07/10/2021 that showed runs of SVT with frequent PVCs (20% burden).  She was placed on amiodarone by Dr. Elberta Fortis who recently saw her 09/04/2022 and reduced the dosage to 100 mg because of increased LFTs.  We will recheck her LFTs.

## 2023-01-12 NOTE — Progress Notes (Signed)
01/12/2023 Megan Hull   [birth year]  [id number]  Primary Physician Irena Reichmann, DO Primary Cardiologist: Runell Gess MD FACP, [place], [place], [place]  HPI:  Megan Hull is a 82 y.o.    mildly overweight, single [place] American female with no children whom I last saw in the office 11/16/2021.Her problems include hypertension, hyperlipidemia, discontinued tobacco abuse of one-third pack per day and stopped in 2017, and family history of premature heart disease. She had a negative stress test on December 24, 2007. She was admitted to the [facility] on January 15, 2011, with jaw pain, thought to be an anginal equivalent. I catheterized her, revealing noncritical CAD with a 60% mid LAD lesion. She had anterior takeoff RCA. She has been asymptomatic since. She had carotid Dopplers performed at Dr. Mathews Robinsons office which apparently were unremarkable. A lipid profile performed in March revealed total cholesterol of 142, LDL of 76, and HDL of 47. Her major complaint is of lower extremity edema, which gets worse during the day. I had done venous reflux studies on her back in November which were normal.since I saw her last a year and a half ago she has noticed increasing lower extremity edema which is dependent as well as increasing dyspnea on exertion. She denies chest pain. Her weight is stable same. She does admit to dietary indiscretion with regard to salt. She empirically increased her diuretic therapy. This resulted in improvement in her edema. She had recently come back from spending 5 weeks in [place] when I saw her last in July of last year and was anticipating going to [place]. I did do a Myoview stress test on her because of chest pain 01/12/16 which was normal. She developed an angioedema in August of last year probably related to her ARB. She didn't require intubation for airway protection. She had no allergy testing and ARB allergy was confirmed. She was begun on amlodipine which  resulted in increasing lower extremity edema, fluid retention and dyspnea. Her amlodipine was discontinued and she was begun on hydralazine. She returns today with complaints of increasing dyspnea on exertion and lower extremity edema. Her PCP recently increased her diuretic which resulted in some improvement in her edema. Since I saw her back 6 months ago she continues to complain of dyspnea on exertion which is progressive.    She saw  Corine Shelter in the office 05/30/2018 complaining some neck pain and shortness of breath.  A Myoview stress test was ordered which was completely normal and a 2D echo revealed normal LV systolic function with grade 1 diastolic dysfunction.      She was seen in the emergency room by Dr. Bebe Shaggy on 05/16/2019 with tachypalpitations which began at night while she was watching TV.  She she thought that she was in bigeminy or had irregular heart rhythm.  She tried a Valsalva maneuver which did not work.  When she arrived to the emergency room she was in sinus rhythm.  She does admit to using decongestants but no pseudoephedrine.  She denies chest pain or shortness of breath.   Since I saw her in the office a year ago she continues to do well.  She was seen by Dr. Elberta Fortis who placed her on amiodarone for her PVC burden of 20% which resulted in marked improvement.  She did have elevated LFTs and he reduced her dose from 200 to 100 mg a day.  Will recheck LFT levels.  Otherwise, she does complain of some dyspnea  but denies chest pain.  She did spend 2 weeks in [place] this past summer including [place] and had a great vacation.   Current Meds  Medication Sig   albuterol (VENTOLIN HFA) 108 (90 Base) MCG/ACT inhaler Inhale 2 puffs into the lungs every 4 (four) hours as needed for wheezing or shortness of breath.   amiodarone (PACERONE) 200 MG tablet Take 0.5 tablets (100 mg total) by mouth daily.   aspirin EC 81 MG tablet Take 1 tablet (81 mg total) by mouth daily.  Swallow whole.   Benralizumab (FASENRA) 30 MG/ML SOSY Inject 1 mL (30 mg total) into the skin every 8 (eight) weeks.   Budeson-Glycopyrrol-Formoterol (BREZTRI AEROSPHERE) 160-9-4.8 MCG/ACT AERO 2 puffs 1-2 times per day   budesonide (PULMICORT) 0.25 MG/2ML nebulizer solution Take 2 mLs (0.25 mg total) by nebulization 2 (two) times daily as needed (shortness of breath, wheezing).   cetirizine (ZYRTEC) 10 MG tablet TAKE 1 TABLET 2 (TWO) TIMES DAILY AS NEEDED FOR ALLERGIES (CAN USE AN EXTRA DOSE DURING FLARE UPS.)   EPINEPHrine 0.3 mg/0.3 mL IJ SOAJ injection Inject 0.3 mg into the muscle as needed (anaphylaxis).   famotidine (PEPCID) 40 MG tablet TAKE 1 TABLET AT BEDTIME   FLUAD QUADRIVALENT 0.5 ML injection Inject 0.5 mLs into the muscle once.   fluticasone (FLONASE) 50 MCG/ACT nasal spray Place 1 spray into both nostrils daily.   furosemide (LASIX) 40 MG tablet Take 1.5 tablets (60 mg total) by mouth daily. (Patient taking differently: Take 60 mg by mouth daily. Takes a tablet daily and an extra half as needed)   hydrALAZINE (APRESOLINE) 25 MG tablet Take 0.5 tablets (12.5 mg total) by mouth 3 (three) times daily as needed (12.5 mg up to three times daily as needed for Systolic BP greater than 160).   ipratropium-albuterol (DUONEB) 0.5-2.5 (3) MG/3ML SOLN Take 3 mLs by nebulization every 6 (six) hours as needed (wheezing, shortness of breath).   metoprolol succinate (TOPROL-XL) 50 MG 24 hr tablet Take 1 tablet (50 mg total) by mouth at bedtime. Take with or immediately following a meal.   montelukast (SINGULAIR) 10 MG tablet Take 1 tablet (10 mg total) by mouth at bedtime.   omeprazole (PRILOSEC) 40 MG capsule 1 tablet 3-7 times per week.   Probiotic Product (PROBIOTIC DAILY PO) Take 1 capsule by mouth daily.   simvastatin (ZOCOR) 40 MG tablet TAKE 1 TABLET EVERY DAY   Spacer/Aero-Holding Chambers (AEROCHAMBER MV) inhaler 1 each by Other route daily. Use as instructed   SPIKEVAX injection Inject  0.5 mLs into the muscle once.   Current Facility-Administered Medications for the 01/12/23 encounter (Office Visit) with Runell Gess, MD  Medication   Benralizumab SOSY 30 mg     Allergies  Allergen Reactions   Nexium [Esomeprazole Magnesium] Anaphylaxis    Pt states she was unable to swallow for several hours after taking the nexium   Ace Inhibitors Swelling    Angioedema    Codeine Other (See Comments)    REACTION: chest pain   Monosodium Glutamate Other (See Comments)    dizziness   Other Hives, Swelling and Other (See Comments)    Red Fish (Hives) Bojangle's chicken (swelling and shortness of breath) PAPRIKA   Amlodipine Other (See Comments)    Lower extremity swelling, itching   Latex Rash    Causes skin to turn red per patient    Shellfish Allergy Hives   Tape Rash    Coban wrap turned her arm red (  after stress test)    Social History   Socioeconomic History   Marital status: Single    Spouse name: Not on file   Number of children: 0   Years of education: 12+   Highest education level: Not on file  Occupational History   Occupation: Reitred  Tobacco Use   Smoking status: Former    Current packs/day: 0.00    Average packs/day: 0.3 packs/day for 15.0 years (3.8 ttl pk-yrs)    Types: Cigarettes    Start date: 02/03/2001    Quit date: 02/04/2016    Years since quitting: 6.9   Smokeless tobacco: Never  Vaping Use   Vaping status: Never Used  Substance and Sexual Activity   Alcohol use: Not Currently    Comment: occasional - tequila shots on holidays   Drug use: No   Sexual activity: Not on file  Other Topics Concern   Not on file  Social History Narrative   Lives alone    Caffeine use: 2 cups coffee per day (mixed decaf/regular)   Social Determinants of Health   Financial Resource Strain: Not on file  Food Insecurity: Not on file  Transportation Needs: Not on file  Physical Activity: Not on file  Stress: Not on file  Social Connections: Not on  file  Intimate Partner Violence: Not on file     Review of Systems: General: negative for chills, fever, night sweats or weight changes.  Cardiovascular: negative for chest pain, dyspnea on exertion, edema, orthopnea, palpitations, paroxysmal nocturnal dyspnea or shortness of breath Dermatological: negative for rash Respiratory: negative for cough or wheezing Urologic: negative for hematuria Abdominal: negative for nausea, vomiting, diarrhea, bright red blood per rectum, melena, or hematemesis Neurologic: negative for visual changes, syncope, or dizziness All other systems reviewed and are otherwise negative except as noted above.    Blood pressure 102/64, pulse 67, height 5\' 5"  (1.651 m), weight 177 lb 6.4 oz (80.5 kg), SpO2 97%.  General appearance: alert and no distress Neck: no adenopathy, no JVD, supple, symmetrical, trachea midline, thyroid not enlarged, symmetric, no tenderness/mass/nodules, and soft bilateral carotid bruits versus transmitted murmur Lungs: clear to auscultation bilaterally Heart: Soft outflow tract murmur consistent with aortic stenosis Extremities: extremities normal, atraumatic, no cyanosis or edema Pulses: 2+ and symmetric Skin: Skin color, texture, turgor normal. No rashes or lesions Neurologic: Grossly normal  EKG not performed today      ASSESSMENT AND PLAN:   Essential hypertension History of essential hypertension blood pressure measured today at 102/64.  She is on metoprolol, and hydralazine.  Dyslipidemia History of dyslipidemia on simvastatin with lipid profile performed 09/02/2021 revealing a total cholesterol of 116, LDL 63 and HDL 41.  Coronary artery disease History of CAD status post cardiac catheterization wounds I performed 01/15/2011 revealing a 60% mid LAD lesion.  She had an anterior takeoff RCA but otherwise no significant CAD.  She denies chest pain.  Palpitations History of palpitations with a monitor performed 07/10/2021 that  showed runs of SVT with frequent PVCs (20% burden).  She was placed on amiodarone by Dr. Elberta Fortis who recently saw her 09/04/2022 and reduced the dosage to 100 mg because of increased LFTs.  We will recheck her LFTs.  Aortic stenosis Mild to moderate aortic stenosis by 2D echo performed 06/27/2022 with normal LV systolic function and grade 1 diastolic dysfunction.  Will repeat this on annual basis.     Runell Gess MD FACP,FACC,FAHA, [facility] 01/12/2023 12:14 PM

## 2023-01-12 NOTE — Assessment & Plan Note (Signed)
History of dyslipidemia on simvastatin with lipid profile performed 09/02/2021 revealing a total cholesterol of 116, LDL 63 and HDL 41.

## 2023-01-12 NOTE — Assessment & Plan Note (Signed)
History of CAD status post cardiac catheterization wounds I performed 01/15/2011 revealing a 60% mid LAD lesion.  She had an anterior takeoff RCA but otherwise no significant CAD.  She denies chest pain.

## 2023-01-12 NOTE — Patient Instructions (Signed)
Medication Instructions:  - No changes  *If you need a refill on your cardiac medications before your next appointment, please call your pharmacy*   Lab Work: - Liver function test (LFTs) ordered today  If you have labs (blood work) drawn today and your tests are completely normal, you will receive your results only by: MyChart Message (if you have MyChart) OR A paper copy in the mail If you have any lab test that is abnormal or we need to change your treatment, we will call you to review the results.   Testing/Procedures: - Echocardiogram ordered for January 2025.  The office will call you to schedule this test.  Your physician has requested that you have an echocardiogram. Echocardiography is a painless test that uses sound waves to create images of your heart. It provides your doctor with information about the size and shape of your heart and how well your heart's chambers and valves are working. This procedure takes approximately one hour. There are no restrictions for this procedure. Please do NOT wear cologne, perfume, aftershave, or lotions (deodorant is allowed). Please arrive 15 minutes prior to your appointment time.    Follow-Up: At [facility], you and your health needs are our priority.  As part of our continuing mission to provide you with exceptional heart care, we have created designated Provider Care Teams.  These Care Teams include your primary Cardiologist (physician) and Advanced Practice Providers (APPs -  Physician Assistants and Nurse Practitioners) who all work together to provide you with the care you need, when you need it.  We recommend signing up for the patient portal called "MyChart".  Sign up information is provided on this After Visit Summary.  MyChart is used to connect with patients for Virtual Visits (Telemedicine).  Patients are able to view lab/test results, encounter notes, upcoming appointments, etc.  Non-urgent messages can be sent to your  provider as well.   To learn more about what you can do with MyChart, go to [email or web address].    Your next appointment:   1 year(s)  Provider:   Nanetta Batty, MD

## 2023-01-16 ENCOUNTER — Ambulatory Visit: Payer: Medicare HMO | Admitting: Podiatry

## 2023-01-16 ENCOUNTER — Encounter: Payer: Self-pay | Admitting: Podiatry

## 2023-01-16 VITALS — BP 147/66

## 2023-01-16 DIAGNOSIS — L84 Corns and callosities: Secondary | ICD-10-CM | POA: Diagnosis not present

## 2023-01-16 DIAGNOSIS — E1151 Type 2 diabetes mellitus with diabetic peripheral angiopathy without gangrene: Secondary | ICD-10-CM | POA: Diagnosis not present

## 2023-01-16 DIAGNOSIS — B351 Tinea unguium: Secondary | ICD-10-CM | POA: Diagnosis not present

## 2023-01-16 DIAGNOSIS — R7401 Elevation of levels of liver transaminase levels: Secondary | ICD-10-CM | POA: Diagnosis not present

## 2023-01-16 DIAGNOSIS — E1159 Type 2 diabetes mellitus with other circulatory complications: Secondary | ICD-10-CM | POA: Diagnosis not present

## 2023-01-16 NOTE — Progress Notes (Unsigned)
Subjective:  Patient ID: Megan Hull, female    DOB: 1941/05/30,  MRN: [id number]  Megan Hull presents to clinic today for:  Chief Complaint  Patient presents with   [facility]   Leg Pain    Left leg pain, Paralyzed in 2016,    Bunions    Left foot   Patient notes nails are thick and elongated, causing pain in shoe gear when ambulating.  She has a callus on the plantar aspect of the left fifth metatarsal head.  Denies history of ulcer to this area.  She is not aware of the diabetic shoe program.  PCP is Irena Reichmann, DO.  Date last seen was 09/14/2022.  Serum glucose level on 10/03/2022 was 161 mg/dL  Allergies  Allergen Reactions   Nexium [Esomeprazole Magnesium] Anaphylaxis    Pt states she was unable to swallow for several hours after taking the nexium   Ace Inhibitors Swelling    Angioedema    Codeine Other (See Comments)    REACTION: chest pain   Monosodium Glutamate Other (See Comments)    dizziness   Other Hives, Swelling and Other (See Comments)    Red Fish (Hives) Bojangle's chicken (swelling and shortness of breath) PAPRIKA   Amlodipine Other (See Comments)    Lower extremity swelling, itching   Latex Rash    Causes skin to turn red per patient    Shellfish Allergy Hives   Tape Rash    Coban wrap turned her arm red (after stress test)   Review of Systems: Negative except as noted in the HPI.  Objective:  Vitals:   01/16/23 1114  BP: (!) 147/66   Megan Hull is a pleasant 82 y.o. female in NAD. AAO x 3.  Vascular Examination: Patient has palpable DP pulse, absent PT pulse bilateral.  Delayed capillary refill bilateral toes.  Sparse digital hair bilateral.  Proximal to distal cooling WNL bilateral.    Dermatological Examination: Interspaces are clear with no open lesions noted bilateral.  Nails are 3-30mm thick, with yellowish/brown discoloration, subungual debris and distal onycholysis x10.  There is pain with compression of nails  x10.  There are hyperkeratotic lesions noted plantar aspect of the left fifth metatarsal head.  Neurological Examination: Protective sensation intact b/l LE. Vibratory sensation diminished b/l LE.  Patient qualifies for at-risk foot care because of diabetes with mild PVD.  Assessment/Plan: 1. Dermatophytosis of nail   2. Type II diabetes mellitus with peripheral circulatory disorder (HCC)   3. Pre-ulcerative calluses     FOR HOME USE ONLY DME DIABETIC SHOE.  Patient would benefit from diabetic shoes with custom diabetic insoles to offload the fifth metatarsal head and prevent pain, callus buildup, and possibly ulceration in the future.  She was scheduled for a diabetic shoe consult with our pedorthist within the next few weeks.  Mycotic nails x10 were sharply debrided with sterile nail nippers and power debriding burr to decrease bulk and length.  Hyperkeratotic lesion x 1 was shaved with #312 blade.   Return in about 3 months (around 04/18/2023) for [facility].   Clerance Lav, DPM, FACFAS [facility]     [address], [place] [address]  Office [phone]  Fax [phone]

## 2023-01-22 ENCOUNTER — Ambulatory Visit: Payer: Medicare HMO

## 2023-01-22 DIAGNOSIS — E785 Hyperlipidemia, unspecified: Secondary | ICD-10-CM | POA: Diagnosis not present

## 2023-01-22 DIAGNOSIS — J45909 Unspecified asthma, uncomplicated: Secondary | ICD-10-CM | POA: Diagnosis not present

## 2023-01-22 DIAGNOSIS — I471 Supraventricular tachycardia, unspecified: Secondary | ICD-10-CM | POA: Diagnosis not present

## 2023-01-22 DIAGNOSIS — R635 Abnormal weight gain: Secondary | ICD-10-CM | POA: Diagnosis not present

## 2023-01-22 DIAGNOSIS — R946 Abnormal results of thyroid function studies: Secondary | ICD-10-CM | POA: Diagnosis not present

## 2023-01-22 DIAGNOSIS — Z9109 Other allergy status, other than to drugs and biological substances: Secondary | ICD-10-CM | POA: Diagnosis not present

## 2023-01-22 DIAGNOSIS — I350 Nonrheumatic aortic (valve) stenosis: Secondary | ICD-10-CM | POA: Diagnosis not present

## 2023-01-22 DIAGNOSIS — I498 Other specified cardiac arrhythmias: Secondary | ICD-10-CM | POA: Diagnosis not present

## 2023-01-22 DIAGNOSIS — E1159 Type 2 diabetes mellitus with other circulatory complications: Secondary | ICD-10-CM | POA: Diagnosis not present

## 2023-01-22 NOTE — Progress Notes (Signed)
Patient presents to the office today for diabetic shoe and insole measuring.  Patient was measured with brannock device to determine size and width for 1 pair of extra depth shoes and foam casted for 3 pair of insoles.   Documentation of medical necessity will be sent to patient's treating diabetic doctor to verify and sign.   Patient's diabetic provider: DANA CHINESE COLLINS D.O.  Shoes and insoles will be ordered at that time and patient will be notified for an appointment for fitting when they arrive.   Shoe size (per patient): 9   Brannock measurement: 9 Patient shoe selection-  Shoe choice:   [id number] Shoe size ordered: 9.5MD ABN and Financial signed items to be fit when in  [employer], CFo, CFm

## 2023-02-13 ENCOUNTER — Ambulatory Visit (INDEPENDENT_AMBULATORY_CARE_PROVIDER_SITE_OTHER): Payer: Medicare HMO | Admitting: *Deleted

## 2023-02-13 DIAGNOSIS — J4550 Severe persistent asthma, uncomplicated: Secondary | ICD-10-CM

## 2023-02-22 ENCOUNTER — Ambulatory Visit (INDEPENDENT_AMBULATORY_CARE_PROVIDER_SITE_OTHER): Payer: Medicare HMO

## 2023-02-22 DIAGNOSIS — L84 Corns and callosities: Secondary | ICD-10-CM

## 2023-02-22 DIAGNOSIS — E1151 Type 2 diabetes mellitus with diabetic peripheral angiopathy without gangrene: Secondary | ICD-10-CM

## 2023-02-22 NOTE — Progress Notes (Signed)

## 2023-03-05 ENCOUNTER — Ambulatory Visit: Payer: Medicare HMO

## 2023-03-05 NOTE — Progress Notes (Signed)
Padding added to Left shoe to reduce heel slippage offered to re-order shoes for her as a laced shoe would be more adjustable  She wants to stay with velcro slip on  Patient states asj feels good will try and will call office if any other problems arise  Addison Bailey Cped, CFo, CFm

## 2023-03-06 ENCOUNTER — Other Ambulatory Visit: Payer: Self-pay | Admitting: Cardiovascular Disease

## 2023-03-06 ENCOUNTER — Other Ambulatory Visit: Payer: Self-pay

## 2023-03-06 MED ORDER — METOPROLOL SUCCINATE ER 50 MG PO TB24: 50.0000 mg | ORAL_TABLET | Freq: Every day | ORAL | 3 refills | Status: DC

## 2023-03-13 NOTE — Progress Notes (Unsigned)
Electrophysiology Office Note:   Date:  03/14/2023  ID:  Megan Hull, DOB 1940-09-17, MRN [id number]  Primary Cardiologist: Nanetta Batty, MD Electrophysiologist: Regan Lemming, MD      History of Present Illness:   Megan Hull is a 82 y.o. female with h/o DM2, HTN, HLD, CAD, and PVCs seen today for routine electrophysiology followup.   Since last being seen in our clinic the patient reports doing well overall. She has mild SOB after walking about 1.5 blocks. Exercises every other day and is very active in her church.  No SOB with bathing or changing clothes. Walks flight of stairs to her basement without difficult, unless she has to do it a bunch in a row.  she denies chest pain, palpitations, dyspnea, PND, orthopnea, nausea, vomiting, dizziness, syncope, edema, weight gain, or early satiety.   Review of systems complete and found to be negative unless listed in HPI.   EP Information / Studies Reviewed:    EKG is ordered today. Personal review as below.  EKG Interpretation Date/Time:  Wednesday March 14 2023 09:45:31 EDT Ventricular Rate:  62 PR Interval:  252 QRS Duration:  80 QT Interval:  454 QTC Calculation: 460 R Axis:   -3  Text Interpretation: Sinus rhythm with 1st degree A-V block Confirmed by Maxine Glenn [phone]) on 03/14/2023 9:54:11 AM     Echo 06/27/2022 Normal EF, mild/mod AS, grade 1 DD  Physical Exam:   VS:  BP (!) 140/76 (BP Location: Left Arm, Patient Position: Sitting, Cuff Size: Large)   Pulse 62   Ht 5\' 5"  (1.651 m)   Wt 181 lb 12.8 oz (82.5 kg)   SpO2 95%   BMI 30.25 kg/m    Wt Readings from Last 3 Encounters:  03/14/23 181 lb 12.8 oz (82.5 kg)  01/12/23 177 lb 6.4 oz (80.5 kg)  11/21/22 176 lb 1.6 oz (79.9 kg)     GEN: Well nourished, well developed in no acute distress NECK: No JVD; No carotid bruits CARDIAC: Regular rate and rhythm, no murmurs, rubs, gallops RESPIRATORY:  Clear to auscultation without rales,  wheezing or rhonchi  ABDOMEN: Soft, non-tender, non-distended EXTREMITIES:  No edema; No deformity   ASSESSMENT AND PLAN:    PVCs With prior burdens up to 20% Has improved on amiodarone 100 mg daily Surveillance labs today  CAD No s/s ischemia Continue ASA Continue statin   HTN Stable on current regimen   DOE After walking about 1.5 block Aortic stenosis is stable on recent echo and upcoming repeat. Only mild murmur on exam.  No edema Recent spirometry stable (even somewhat improved from prior).  Encouraged weight loss and increased activity as tolerated   Follow up with Dr. Elberta Fortis in 6 months  Signed, Graciella Freer, PA-C

## 2023-03-14 ENCOUNTER — Ambulatory Visit: Payer: Medicare HMO | Attending: Student | Admitting: Student

## 2023-03-14 ENCOUNTER — Encounter: Payer: Self-pay | Admitting: Student

## 2023-03-14 VITALS — BP 140/76 | HR 62 | Ht 65.0 in | Wt 181.8 lb

## 2023-03-14 DIAGNOSIS — I493 Ventricular premature depolarization: Secondary | ICD-10-CM

## 2023-03-14 DIAGNOSIS — I10 Essential (primary) hypertension: Secondary | ICD-10-CM

## 2023-03-14 DIAGNOSIS — I2510 Atherosclerotic heart disease of native coronary artery without angina pectoris: Secondary | ICD-10-CM

## 2023-03-14 NOTE — Patient Instructions (Addendum)
Medication Instructions:  Your physician recommends that you continue on your current medications as directed. Please refer to the Current Medication list given to you today.  *If you need a refill on your cardiac medications before your next appointment, please call your pharmacy*  Lab Work: CMET, TSH, FreeT4, CBC-TODAY If you have labs (blood work) drawn today and your tests are completely normal, you will receive your results only by: MyChart Message (if you have MyChart) OR A paper copy in the mail If you have any lab test that is abnormal or we need to change your treatment, we will call you to review the results.  Follow-Up: At [facility], you and your health needs are our priority.  As part of our continuing mission to provide you with exceptional heart care, we have created designated Provider Care Teams.  These Care Teams include your primary Cardiologist (physician) and Advanced Practice Providers (APPs -  Physician Assistants and Nurse Practitioners) who all work together to provide you with the care you need, when you need it.  Your next appointment:   6 month(s)  Provider:   Loman Brooklyn, MD

## 2023-03-15 LAB — COMPREHENSIVE METABOLIC PANEL
ALT: 23 [IU]/L (ref 0–32)
AST: 23 [IU]/L (ref 0–40)
Albumin: 4 g/dL (ref 3.7–4.7)
Alkaline Phosphatase: 89 [IU]/L (ref 44–121)
BUN/Creatinine Ratio: 14 (ref 12–28)
BUN: 18 mg/dL (ref 8–27)
Bilirubin Total: 0.5 mg/dL (ref 0.0–1.2)
CO2: 26 mmol/L (ref 20–29)
Calcium: 8.8 mg/dL (ref 8.7–10.3)
Chloride: 100 mmol/L (ref 96–106)
Creatinine, Ser: 1.28 mg/dL — ABNORMAL HIGH (ref 0.57–1.00)
Globulin, Total: 2.4 g/dL (ref 1.5–4.5)
Glucose: 138 mg/dL — ABNORMAL HIGH (ref 70–99)
Potassium: 4.1 mmol/L (ref 3.5–5.2)
Sodium: 137 mmol/L (ref 134–144)
Total Protein: 6.4 g/dL (ref 6.0–8.5)
eGFR: 42 mL/min/{1.73_m2} — ABNORMAL LOW (ref >59)

## 2023-03-15 LAB — CBC
Hematocrit: 40.6 % (ref 34.0–46.6)
Hemoglobin: 13.3 g/dL (ref 11.1–15.9)
MCH: 31 pg (ref 26.6–33.0)
MCHC: 32.8 g/dL (ref 31.5–35.7)
MCV: 95 fL (ref 79–97)
Platelets: 229 10*3/uL (ref 150–450)
RBC: 4.29 x10E6/uL (ref 3.77–5.28)
RDW: 12.7 % (ref 11.7–15.4)
WBC: 4.2 10*3/uL (ref 3.4–10.8)

## 2023-03-15 LAB — TSH: TSH: 0.881 u[IU]/mL (ref 0.450–4.500)

## 2023-03-15 LAB — T4, FREE: Free T4: 1.85 ng/dL — ABNORMAL HIGH (ref 0.82–1.77)

## 2023-03-16 ENCOUNTER — Telehealth: Payer: Self-pay | Admitting: Cardiovascular Disease

## 2023-03-16 MED ORDER — ISOSORBIDE MONONITRATE ER 30 MG PO TB24: 30.0000 mg | ORAL_TABLET | Freq: Every day | ORAL | 4 refills | Status: DC

## 2023-03-16 NOTE — Telephone Encounter (Signed)
Spoke with pt regarding her isosorbide mononitrate, per chart review pt reported that she was not taking this medication at office visit on 11/21/22 with Dr. Lucie Leather. Pt states that this was a mistake and she has been taking this medication all along with no issues. Will re-instate medication and send refills in for pt to preferred pharmacy. Pt verbalizes understanding.

## 2023-03-16 NOTE — Telephone Encounter (Signed)
*  STAT* If patient is at the pharmacy, call can be transferred to refill team.   1. Which medications need to be refilled? (please list name of each medication and dose if known) Isosorbide  2. Would you like to learn more about the convenience, safety, & potential cost savings by using the [facility]?    3. Are you open to using the [facility] (Type [facility].    4. Which pharmacy/location (including street and city if local pharmacy) is medication to be sent to?  [facility]   5. Do they need a 30 day or 90 day supply? 90 days and refills-please call in today-going out of town

## 2023-03-16 NOTE — Telephone Encounter (Signed)
Pt is requesting a refill on isosorbide. This medication was D/C by another provider. Would Dr. Allyson Sabal like to reorder this medication? Please address

## 2023-04-24 ENCOUNTER — Ambulatory Visit: Payer: Medicare HMO | Admitting: Podiatry

## 2023-05-12 ENCOUNTER — Encounter (INDEPENDENT_AMBULATORY_CARE_PROVIDER_SITE_OTHER): Payer: Medicare HMO

## 2023-05-12 DIAGNOSIS — Z79899 Other long term (current) drug therapy: Secondary | ICD-10-CM

## 2023-05-12 DIAGNOSIS — I493 Ventricular premature depolarization: Secondary | ICD-10-CM | POA: Diagnosis not present

## 2023-05-14 MED ORDER — POTASSIUM CHLORIDE CRYS ER 20 MEQ PO TBCR: 20.0000 meq | EXTENDED_RELEASE_TABLET | Freq: Every day | ORAL | 0 refills | Status: DC

## 2023-05-14 NOTE — Addendum Note (Signed)
Addended by: Freddi Starr on: 05/14/2023 01:17 PM   Modules accepted: Orders

## 2023-05-14 NOTE — Telephone Encounter (Signed)
Spoke with pt regarding her my chart message, she reports a weight gain from 165 to 181 lb. She wears her compression hose and the swelling in her feet and ankles is some what controlled but she does take an extra 20 mg of furosemide every other day because her legs will swell during the day. She does reports she has trouble fitting into her pants because of her abdomen. She has SOB when bending over and walking through the grocery store. She sleeps at a 35 degree angle because of post nasal drip so she does not have SOB but she can not lay flat. She reports her medical doctor was going to increase her furosemide but because of her kidney function was elevated she decided not to. I spoke with the patient about increasing her furosemide for 3 days but she reports that usually helps for the 3 days but then the weight comes right back. Follow up scheduled for next week but will forward to jesse cleaver np to review for any medication changes prior to appointment.

## 2023-05-14 NOTE — Telephone Encounter (Signed)
Spoke with pt, aware of the recommendations. New script sent to the pharmacy patient has an appointment next week and will draw lab at day of the procedure.

## 2023-05-14 NOTE — Telephone Encounter (Signed)
Yes, please have her take 80 mg of lasix x 3 days and 20 meq of potassium x3 days. After 3 days we will stop potassium and resume 40 mg lasix daily. Please order a BMP to be drawn one week from now. I agree with OV. Thank you.   Thomasene Ripple. Ginelle Bays NP-C     05/14/2023, 10:59 AM [facility] [address] Office [phone] Fax [phone]'

## 2023-05-16 ENCOUNTER — Telehealth: Payer: Self-pay | Admitting: *Deleted

## 2023-05-16 ENCOUNTER — Ambulatory Visit: Payer: Medicare HMO

## 2023-05-16 DIAGNOSIS — J4550 Severe persistent asthma, uncomplicated: Secondary | ICD-10-CM

## 2023-05-16 NOTE — Telephone Encounter (Signed)
Patient called and advised she got letter she is denied for PAP for [employer]. I advised her this is due to income changes with PAP programs and explained new Part D payment programs and she will look into same let me know what she decides

## 2023-05-17 ENCOUNTER — Other Ambulatory Visit: Payer: Self-pay | Admitting: Allergy and Immunology

## 2023-05-17 NOTE — Progress Notes (Unsigned)
Cardiology Clinic Note   Patient Name: Megan Hull Date of Encounter: 05/17/2023  Primary Care Provider:  Irena Reichmann, DO Primary Cardiologist:  Nanetta Batty, MD  Patient Profile    Megan Hull 82 year old female presents to the clinic today for an evaluation of her low blood pressure and heart rate.   Past Medical History    Past Medical History:  Diagnosis Date   Arthritis    CHF (congestive heart failure) (HCC)    Diabetes mellitus without complication (HCC)    borderline   Diarrhea    Diverticulosis    Dysrhythmia    controlled with Metoprolol-12-19-13 LOV -Dr. Allyson Sabal sees yearly   Family history of premature CAD    GERD (gastroesophageal reflux disease)    Heart murmur    yrs ago   HLD (hyperlipidemia)    pt reports cholesterol normal now (07/21/19).   Hypertension    Lower extremity edema    Status post dilation of esophageal narrowing    Supraventricular arrhythmia    Tobacco abuse    Past Surgical History:  Procedure Laterality Date   ANTERIOR CERVICAL DECOMP/DISCECTOMY FUSION N/A 06/08/2015   Procedure: CERVICAL FIVE-SIX ,CERVICAL SIX-SEVEN ANTERIOR CERVICAL DECOMPRESSION/DISCECTOMY FUSION PLATING BONEGRAFT;  Surgeon: Barnett Abu, MD;  Location: [facility];  Service: Neurosurgery;  Laterality: N/A;   CARDIAC CATHETERIZATION  01/15/2009   non-critical CAD, 60% mid LAD lesion (Dr. Erlene Quan)   CATARACT EXTRACTION, BILATERAL     most recent 11/19   COLONOSCOPY     ESOPHAGEAL DILATION  2014   infected lymph node surgery  80's   NM MYOCAR PERF WALL MOTION  04/2011   bruce myoview; normal pattern of perfusion, EF 80%, low risk scan   SINUS ENDO WITH FUSION Bilateral 03/04/2020   Procedure: SINUS ENDOSCOPY WITH FUSION NAVIGATION;  Surgeon: Osborn Coho, MD;  Location: [facility];  Service: ENT;  Laterality: Bilateral;   TONSILLECTOMY  70's   TRANSTHORACIC ECHOCARDIOGRAM  12/2007   mild conc LVH   TURBINATE REDUCTION  Bilateral 03/04/2020   Procedure: TURBINATE REDUCTION;  Surgeon: Osborn Coho, MD;  Location: [facility];  Service: ENT;  Laterality: Bilateral;    Allergies  Allergies  Allergen Reactions   Nexium [Esomeprazole Magnesium] Anaphylaxis    Pt states she was unable to swallow for several hours after taking the nexium   Ace Inhibitors Swelling    Angioedema    Codeine Other (See Comments)    REACTION: chest pain   Monosodium Glutamate Other (See Comments)    dizziness   Other Hives, Swelling and Other (See Comments)    Red Fish (Hives) Bojangle's chicken (swelling and shortness of breath) PAPRIKA   Amlodipine Other (See Comments)    Lower extremity swelling, itching   Latex Rash    Causes skin to turn red per patient    Shellfish Allergy Hives   Tape Rash    Coban wrap turned her arm red (after stress test)    History of Present Illness    [name] has a PMH of essential hypertension, coronary artery disease, chronic diastolic CHF, SVT, esophageal reflux, DJD, dyslipidemia, lower extremity numbness, lower extremity edema, dyspnea, and left foot drop.  Cardiac catheterization 812 showed 60% mid LAD narrowing.  Angioedema secondary to ACE inhibitor.     She was seen by Dr. Allyson Sabal 12/20.  An echocardiogram 12/19 and nuclear stress test 1/20 after her complaints of neck pain and dyspnea on exertion showed low  risk.  Her echocardiogram showed an LVEF of 60-65%, mild LVH and G1 DD.  She was also evaluated by pulmonary and was not felt to have contributed COPD.   When she last presented to the clinic on 07/30/2019 she brought blood pressure readings that showed consistent elevated systolic blood pressure 140-160 systolic.  She brought her blood pressure monitor from home which showed an increase in systolic pressure by around 40 points.  Her blood pressure in the office was 120s over 60s.  She was grateful to discover this.  She was doing well from a cardiac standpoint.   She continued to have chronic dyspnea on exertion she had been prescribed albuterol however had not used the medication.  She had not had any recurrent episodes of PSVT.   She presented to the clinic 12/03/2019 for follow-up evaluation and stated she felt well.  She stated that she  had episodes where her blood pressure was in the 115's over 60s and 70s and she had taken her hydralazine which further dropped down her blood pressure and maked her feel not well.  She requested that she have 25 mg tablets to be able to take in the evening when her blood pressure is lower.  She continuee to be very physically active in her church and followed a heart healthy diet.  She brought a blood pressure log  that showed well-controlled blood pressure.  She stated that she had an appointment with Dr. Allyson Sabal in August for clearance for her sinus surgery.  I  gave her a prescription for an additional 25 mg of hydralazine, gave her the salty 6 diet sheet, and had her follow-up as scheduled.   She was seen by Dr. Allyson Sabal 02/10/2020 in follow-up.  She continued to do well.  She did complain of allergies and sinus drainage.  Her sinus surgery was discussed and deemed to be a safe procedure from a cardiac standpoint.  Her blood pressure was well managed at that time.   She presented to the clinic 08/09/2020 for follow-up evaluation and stated around 1 month ago she had asthma exacerbation that required nebulizer treatments and steroids.  She reported that the treatment greatly improved her breathing.  However currently she was still working to get over her exacerbation.  She was taking nebulizer treatments when she noticed wheezing.  She was managing her blood pressure with her hydralazine.  Her carvedilol was held by her PCP and she was taking 12.5 mg when her heart rate increased to 95.  I agreed with the change at the current time.  I  had her continue her current medication regimen.  She reported that she was going to [place] for  the entire month of May and she would not be available for follow-up until the beginning of June.  I  gave her the salty 6 diet sheet, had her maintain her blood pressure log and follow-up early June.  Planned to repeat her fasting lipid panel at that time.   She presented to the [facility] emergency department 08/28/2020 with complaints of lower extremity swelling.  Her blood pressure was 140/74 with pulse of 67.  Her furosemide was increased to 40 mg twice daily x2 days.  She was then instructed to resume her 40 mg morning and 20 mg nightly dosing.  Lower extremity compression stockings, low-salt diet were reviewed.  She was given 40 mg IV Lasix x1.  CBC/CMP/troponins unremarkable.  She reported that her symptoms had improved and her neck pain had resolved  after IV diuresis, lidocaine patch, and Tylenol.  She was discharged in stable condition.   She presented to the clinic 11/17/2020 for follow-up evaluation stated she was having more difficulty with routine daily activities.  She reported that she noticed increased shortness of breath with increased physical activity.  Her weight was up about 3 pounds from her dry weight.  She felt that she was not urinating as much with her furosemide.  She continued to follow a heart healthy low-sodium diet, wear her support stockings, and limit her fluid intake.  I will prescribe torsemide 20 mg in a.m. and 10 mg in the p.m.   Her furosemide was stopped.  I  ordered a BMP in 1 week.   She contacted the nurse triage line on 03/01/2021.  She reported having COVID on 8/23.  During the time of the call, she reported low blood pressures with systolic pressures ranging in the 109-112 range with heart rates in the 50s.  I instructed her to decrease her carvedilol to 6.25 mg daily, continue her Imdur, and only take hydralazine for systolic blood pressure greater than 150.   She presented the clinic 03/08/2021 for follow-up evaluation stated she has noticed that her blood pressure was   higher in the mornings.  Her blood pressure log shows blood pressures starting now in the low 100s and increasing over the weekend to the mid 140s and occasionally in the 160s.  She states that she can tell when her blood pressure is elevated due to a hot left year.  We will increase her carvedilol to 12.5 mg in the morning and continue her 6.25 mg afternoon/evening dose.    She was seen in follow-up by Dr. Allyson Sabal on 11/16/2021.  She was stable from a cardiac standpoint at that time.  She had been hospitalized in March due to questionable TIA.  She was seen by EP for palpitations.  She wore a cardiac event monitor which showed frequent PVCs with a 20% burden.  She was started on mexiletine and did not tolerate the medication.  Mexiletine was discontinued and she was placed on amiodarone.  She was seen in follow-up by Riner C PA-C on 01/18/2022.  She was doing well at that time.  She reported new and frequent stuttering breath.  She denied shortness of breath.  It was not felt that her symptoms were related to amiodarone or cardiac in nature.  She was advised to follow-up with her PCP.  She denied cough, shortness of breath, dyspnea and chest pain.    She presented to the clinic 07/14/22 for follow-up evaluation and stated she occasionally noticed elevated blood pressures in the mornings.  She noticed her blood pressures were more elevated when she retained more fluid.  She was instructed by her PCP to only take 40 mg of furosemide daily due to her increased creatinine.  We reviewed the importance of balancing hydration and medication.  She expressed understanding.  Her blood pressure initially was 144/62 and on recheck was 134/82.  I  changed her hydralazine prescription to 12.5 mg 3 times daily as needed for systolic blood pressure greater than 160.  I asked her to maintain her blood pressure log.  She reported that she was traveling to [facility] to stay during [employer] time.  She was excited about the trip.  We  reviewed her echocardiogram.  She expressed understanding.  I  planned follow-up in 6 months, I refilled her amiodarone.  She was seen in follow-up by  Otilio Saber, PA-C on 10-24.  Her TSH was normal.  Her free T4 was slightly elevated at 1.85.  Her CMP showed creatinine of 1.28 and EGFR of 42.  She was stable from a cardiac standpoint.  She did note mild shortness of breath after walking 1.5 blocks.  She was exercising every other day and was active in church.  She was able to walk her basement stairs without difficulty.  She denied chest pain orthopnea and PND.  Follow-up was planned for 6 months.  She contacted the nurse triage line on 05/12/2023.  She reported a weight gain from 165 pounds to 181 pounds.  She noted compliance with her lower extremity compression stockings.  She was taking an extra furosemide (20 mg) every other day.  She inquired about taking a GLP-1 medication such as Ozempic for her weight.  I increased her furosemide to 80 mg x 3 days and added 20 mill equivalents of potassium x 3 days.  Office visit was scheduled.  She presents to the clinic today for follow-up evaluation and states***.   Today she denies chest pain, shortness of breath, lower extremity edema, fatigue, palpitations, melena, hematuria, hemoptysis, diaphoresis, weakness, presyncope, syncope, orthopnea, and PND.   Home Medications    Prior to Admission medications   Medication Sig Start Date End Date Taking? Authorizing Provider  albuterol (VENTOLIN HFA) 108 (90 Base) MCG/ACT inhaler Inhale 2 puffs into the lungs every 4 (four) hours as needed for wheezing or shortness of breath. 05/16/22   Kozlow, Alvira Philips, MD  Alum & Mag Hydroxide-Simeth (ANTACID ADVANCED PO) Take 1 tablet by mouth daily as needed (indigestion).     [provider]  amiodarone (PACERONE) 200 MG tablet Take 1 tablet (200 mg total) by mouth daily. 11/25/21   Graciella Freer, PA-C  aspirin EC 81 MG tablet Take 1 tablet (81 mg  total) by mouth daily. Swallow whole. 09/03/20   Ronney Asters, NP  Budeson-Glycopyrrol-Formoterol (BREZTRI AEROSPHERE) 160-9-4.8 MCG/ACT AERO Inhale 2 puffs into the lungs in the morning and at bedtime. 05/16/22   Kozlow, Alvira Philips, MD  budesonide (PULMICORT) 0.25 MG/2ML nebulizer solution Take 2 mLs (0.25 mg total) by nebulization 2 (two) times daily as needed (shortness of breath, wheezing). 01/17/22   Kozlow, Alvira Philips, MD  cetirizine (ZYRTEC) 10 MG tablet Take 10 mg by mouth daily.    [provider]  EPINEPHrine 0.3 mg/0.3 mL IJ SOAJ injection Inject 0.3 mg into the muscle as needed (anaphylaxis). 05/16/22   Kozlow, Alvira Philips, MD  famotidine (PEPCID) 40 MG tablet Take 1 tablet (40 mg total) by mouth at bedtime. 01/17/22   Kozlow, Alvira Philips, MD  FLUAD QUADRIVALENT 0.5 ML injection Inject 0.5 mLs into the muscle once. 03/21/22   [provider]  fluticasone (FLONASE) 50 MCG/ACT nasal spray Place 1 spray into both nostrils daily. 05/16/22   Kozlow, Alvira Philips, MD  furosemide (LASIX) 40 MG tablet Take 1.5 tablets (60 mg total) by mouth daily. 11/16/21   Runell Gess, MD  hydrALAZINE (APRESOLINE) 25 MG tablet TAKE 1 TABLET THREE TIMES DAILY AS NEEDED FOR SYSTOLIC BLOOD PRESSURE GREATER THAN 150 (NEED MD APPOINTMENT) 05/03/22   Runell Gess, MD  ipratropium-albuterol (DUONEB) 0.5-2.5 (3) MG/3ML SOLN Take 3 mLs by nebulization every 6 (six) hours as needed (wheezing, shortness of breath). 05/16/22   Kozlow, Alvira Philips, MD  isosorbide mononitrate (IMDUR) 30 MG 24 hr tablet Take 1 tablet (30 mg total) by mouth daily. 11/16/21  Runell Gess, MD  metoprolol succinate (TOPROL-XL) 50 MG 24 hr tablet Take 1 tablet (50 mg total) by mouth at bedtime. Take with or immediately following a meal. 11/16/21   Runell Gess, MD  montelukast (SINGULAIR) 10 MG tablet Take 1 tablet (10 mg total) by mouth at bedtime. 05/16/22   Kozlow, Alvira Philips, MD  Multiple Vitamins-Minerals (AIRBORNE PO) Take 1 tablet by mouth daily.     [provider]  omeprazole (PRILOSEC) 40 MG capsule Take 1 capsule (40 mg total) by mouth every morning. 05/16/22   Kozlow, Alvira Philips, MD  Probiotic Product (PROBIOTIC DAILY PO) Take 1 capsule by mouth daily.    [provider]  simvastatin (ZOCOR) 40 MG tablet TAKE 1 TABLET EVERY DAY 04/28/22   Runell Gess, MD  Spacer/Aero-Holding Chambers (AEROCHAMBER MV) inhaler 1 each by Other route daily. Use as instructed 09/27/21   Kozlow, Alvira Philips, MD  [facility] injection Inject 0.5 mLs into the muscle once. 03/31/22   [provider]    Family History    Family History  Problem Relation Age of Onset   Heart disease Brother        MI @ 83, HTN @ 52, DM @ 50    Heart disease Father        MI   Heart disease Mother        MI, CVA @ 64   Heart disease Sister        CHF, CVA   Breast cancer Sister 62   Esophageal cancer Brother    Alzheimer's disease Sister    Heart disease Brother        MI in 51s, lung cancer   Heart disease Sister    Hypertension Sister        x2   Hyperlipidemia Sister        x2   Diabetes Sister        x2   Cancer Sister    Hypertension Brother    Colon cancer Neg Hx    She indicated that her mother is deceased. She indicated that her father is deceased. She indicated that two of her ten sisters are alive. She indicated that only one of her six brothers is alive. She indicated that her maternal grandmother is deceased. She indicated that her maternal grandfather is deceased. She indicated that her paternal grandmother is deceased. She indicated that her paternal grandfather is deceased. She indicated that the status of her neg hx is unknown.  Social History    Social History   Socioeconomic History   Marital status: Single    Spouse name: Not on file   Number of children: 0   Years of education: 12+   Highest education level: Not on file  Occupational History   Occupation: Re[employer]  Tobacco Use   Smoking status: Former    Current  packs/day: 0.00    Average packs/day: 0.3 packs/day for 15.0 years (3.8 ttl pk-yrs)    Types: Cigarettes    Start date: 02/03/2001    Quit date: 02/04/2016    Years since quitting: 7.2   Smokeless tobacco: Never  Vaping Use   Vaping status: Never Used  Substance and Sexual Activity   Alcohol use: Not Currently    Comment: occasional - tequila shots on holidays   Drug use: No   Sexual activity: Not on file  Other Topics Concern   Not on file  Social History Narrative   Lives alone  Caffeine use: 2 cups coffee per day (mixed decaf/regular)   Social Determinants of Health   Financial Resource Strain: Not on file  Food Insecurity: Not on file  Transportation Needs: Not on file  Physical Activity: Not on file  Stress: Not on file  Social Connections: Not on file  Intimate Partner Violence: Not on file     Review of Systems    General:  No chills, fever, night sweats or weight changes.  Cardiovascular:  No chest pain, dyspnea on exertion, edema, orthopnea, palpitations, paroxysmal nocturnal dyspnea. Dermatological: No rash, lesions/masses Respiratory: No cough, dyspnea Urologic: No hematuria, dysuria Abdominal:   No nausea, vomiting, diarrhea, bright red blood per rectum, melena, or hematemesis Neurologic:  No visual changes, wkns, changes in mental status. All other systems reviewed and are otherwise negative except as noted above.  Physical Exam    VS:  There were no vitals taken for this visit. , BMI There is no height or weight on file to calculate BMI. GEN: Well nourished, well developed, in no acute distress. HEENT: normal. Neck: Supple, no JVD, carotid bruits, or masses. Cardiac: RRR, systolic murmur heard best al***ong right sternal border , rubs, or gallops. No clubbing, cyanosis, edema.  Radials/DP/PT 2+ and equal bilaterally.  Respiratory:  Respirations regular and unlabored, clear to auscultation bilaterally. GI: Soft, nontender, nondistended, BS + x 4. MS: no  deformity or atrophy. Skin: warm and dry, no rash. Neuro:  Strength and sensation are intact. Psych: Normal affect.  Accessory Clinical Findings    Recent Labs: 03/14/2023: ALT 23; BUN 18; Creatinine, Ser 1.28; Hemoglobin 13.3; Platelets 229; Potassium 4.1; Sodium 137; TSH 0.881   Recent Lipid Panel    Component Value Date/Time   CHOL 116 09/02/2021 0216   TRIG 61 09/02/2021 0216   HDL 41 09/02/2021 0216   CHOLHDL 2.8 09/02/2021 0216   VLDL 12 09/02/2021 0216   LDLCALC 63 09/02/2021 0216    No BP recorded.  {Refresh Note OR Click here to enter BP  :1}***    ECG personally reviewed by me today-none today.  Echocardiogram 07/01/2021    1. Left ventricular ejection fraction, by estimation, is 60 to 65%. The  left ventricle has normal function. The left ventricle has no regional  wall motion abnormalities. There is mild asymmetric left ventricular  hypertrophy of the septal and basal  segments. Left ventricular diastolic parameters were normal.   2. Right ventricular systolic function is normal. The right ventricular  size is normal.   3. Left atrial size was mildly dilated.   4. The mitral valve is degenerative. Trivial mitral valve regurgitation.  No evidence of mitral stenosis.   5. The aortic valve is normal in structure. There is moderate  calcification of the aortic valve. There is moderate thickening of the  aortic valve. Aortic valve regurgitation is trivial. Mild to moderate  aortic valve stenosis.   6. The inferior vena cava is normal in size with greater than 50%  respiratory variability, suggesting right atrial pressure of 3 mmHg.    06/14/2018: stress myoview Nuclear stress EF: 73%. The left ventricular ejection fraction is hyperdynamic (>65%). There was no ST segment deviation noted during stress. The study is normal. This is a low risk study.   Normal stress nuclear study with no ischemia or infarction.  Gated ejection fraction 73% with normal wall motion.     Assessment & Plan   1.  Chronic diastolic CHF-weight today***.  Compliant with lower extremity support stockings.  Continues to be NYHA class I-2.  Echocardiogram 07/01/2021 showed EF of 60-65%.  Details above.  Reviewed importance of strict dietary adherence to low-salt diet. Continue current medical therapy Continue fluid restriction-reviewed Daily weights-contact office with a weight increase of 2 to 3 pounds overnight or 5 pounds in 1 week.-Reviewed Ordered BMP  Essential hypertension-BP today 134/***82. Maintain blood pressure log Continue isosorbide, metoprolol, hydralazine Heart healthy low-sodium diet Increase physical activity as tolerated  Coronary artery disease-denies anginal symptoms with exertion and at rest..  Continues to be fairly physically active.  Cardiac catheterization 2012 showed 60% mid LAD stenosis.  Underwent nuclear stress testing 1/20 which showed low risk and no ischemia. Continue aspirin, Imdur, simvastatin, metoprolol Heart healthy low-sodium diet  Palpitations, PVCs-follows with EP.  Previously did not tolerate mexiletine and was transitioned to amiodarone.   Continue amiodarone Avoid triggers caffeine, chocolate, EtOH etc.  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  She required 48 hours of intubation.  Subsequent allergy testing showed allergy to ARB/ACE and red meat.  Disposition: Follow-up with Dr.Berry or me in 6***months.   Thomasene Ripple. Berry Gallacher NP-C     05/17/2023, 11:58 AM [facility] [address] Office [phone] Fax [phone]    I spent 14 ***minutes examining this patient, reviewing medications, and using patient centered shared decision making involving her cardiac care.  Prior to her visit I spent greater than 20 minutes reviewing her past medical history,  medications, and prior cardiac tests.

## 2023-05-21 ENCOUNTER — Ambulatory Visit (INDEPENDENT_AMBULATORY_CARE_PROVIDER_SITE_OTHER): Payer: Medicare HMO | Admitting: Podiatry

## 2023-05-21 ENCOUNTER — Encounter: Payer: Self-pay | Admitting: Podiatry

## 2023-05-21 DIAGNOSIS — B351 Tinea unguium: Secondary | ICD-10-CM

## 2023-05-21 DIAGNOSIS — N816 Rectocele: Secondary | ICD-10-CM | POA: Insufficient documentation

## 2023-05-21 DIAGNOSIS — M79674 Pain in right toe(s): Secondary | ICD-10-CM | POA: Diagnosis not present

## 2023-05-21 DIAGNOSIS — M79675 Pain in left toe(s): Secondary | ICD-10-CM | POA: Diagnosis not present

## 2023-05-21 DIAGNOSIS — E1151 Type 2 diabetes mellitus with diabetic peripheral angiopathy without gangrene: Secondary | ICD-10-CM

## 2023-05-21 DIAGNOSIS — R194 Change in bowel habit: Secondary | ICD-10-CM | POA: Insufficient documentation

## 2023-05-21 NOTE — Progress Notes (Signed)
Subjective:  Patient ID: Megan Hull, female    DOB: [birth year],  MRN: [id number]  Megan Hull presents to clinic today for:  Chief Complaint  Patient presents with   Foot Pain    Trim toenails-diabetic - 6.2, patient states that she got the diabetic shoes but still bothering the bunion on the left foot  . Patient notes nails are thick, discolored, elongated and painful in shoegear when trying to ambulate.  She notes her shoe was stretched recently to accommodate the bunion on the left foot, but the shoe went back to its original form and started rubbing her bunion again.  PCP is Irena Reichmann, DO.  Past Medical History:  Diagnosis Date   Arthritis    CHF (congestive heart failure) (HCC)    Diabetes mellitus without complication (HCC)    borderline   Diarrhea    Diverticulosis    Dysrhythmia    controlled with Metoprolol-12-19-13 LOV -Dr. Allyson Sabal sees yearly   Family history of premature CAD    GERD (gastroesophageal reflux disease)    Heart murmur    yrs ago   HLD (hyperlipidemia)    pt reports cholesterol normal now (07/21/19).   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 Erlene Quan)   CATARACT EXTRACTION, BILATERAL     most recent 11/19   COLONOSCOPY     ESOPHAGEAL DILATION  2014   infected lymph node surgery  80's   NM MYOCAR PERF WALL MOTION  04/2011   bruce myoview; normal pattern of perfusion, EF 80%, low risk scan   SINUS ENDO WITH FUSION Bilateral  03/04/2020   Procedure: SINUS ENDOSCOPY WITH FUSION NAVIGATION;  Surgeon: Osborn Coho, MD;  Location: [facility];  Service: ENT;  Laterality: Bilateral;   TONSILLECTOMY  70's   TRANSTHORACIC ECHOCARDIOGRAM  12/2007   mild conc LVH   TURBINATE REDUCTION Bilateral 03/04/2020   Procedure: TURBINATE REDUCTION;  Surgeon: Osborn Coho, MD;  Location: [facility];  Service: ENT;  Laterality: Bilateral;    Allergies  Allergen Reactions   Nexium [Esomeprazole Magnesium] Anaphylaxis    Pt states she was unable to swallow for several hours after taking the nexium   Ace Inhibitors Swelling    Angioedema    Codeine Other (See Comments)    REACTION: chest pain   Monosodium Glutamate Other (See Comments)    dizziness   Other Hives, Swelling and Other (See Comments)    Red Fish (Hives) Bojangle's chicken (swelling and shortness of breath) PAPRIKA   Amlodipine Other (See Comments)    Lower extremity swelling, itching   Latex Rash    Causes skin to turn red per patient    Shellfish Allergy Hives   Tape Rash    Coban wrap turned her arm red (after stress test)   Review of Systems: Negative except as noted in the HPI.  Objective:  Megan Hull is a pleasant 82 y.o. female in NAD. AAO x 3.  Vascular Examination: Capillary  refill time is 3-5 seconds to toes bilateral. Palpable pedal pulses b/l LE. Digital hair present b/l.  Skin temperature gradient WNL b/l. No varicosities b/l. No cyanosis noted b/l.   Dermatological Examination: Pedal skin with normal turgor, texture and tone b/l. No open wounds. No interdigital macerations b/l. Toenails x10 are 3mm thick, discolored, dystrophic with subungual debris. There is pain with compression of the nail plates.  They are elongated x10  Assessment/Plan: 1. Pain due to onychomycosis of toenails of both feet   2. Type II diabetes mellitus with peripheral circulatory disorder (HCC)    The mycotic toenails were  sharply debrided x10 with sterile nail nippers and a power debriding burr to decrease bulk/thickness and length.    Pedorthist was not in the office today, but the ball and ring stretching apparatus was utilized to spot stretch to the bunion area of her left shoe.  This was stretched for at least 5 minutes.  She noted immediate improvement upon standing.  She is concerned that it might go back to its original, and stretched position.  If this does occur she could drop the shoe off to be stretched overnight.  If this still does not prevent pressure to the bunion area, she will need to follow-up with our pedorthist specifically for reevaluation.  Return in about 4 months (around 09/19/2023) for [facility].   Clerance Lav, DPM, FACFAS [facility]     [address] [place], [place] [address]                Office [phone]  Fax [phone]

## 2023-05-22 ENCOUNTER — Ambulatory Visit: Payer: Medicare HMO | Admitting: Allergy and Immunology

## 2023-05-22 ENCOUNTER — Ambulatory Visit: Payer: Medicare HMO | Attending: General Practice | Admitting: General Practice

## 2023-05-22 ENCOUNTER — Encounter: Payer: Self-pay | Admitting: General Practice

## 2023-05-22 ENCOUNTER — Ambulatory Visit: Payer: Medicare HMO | Admitting: Podiatry

## 2023-05-22 VITALS — BP 130/74 | HR 64 | Ht 65.0 in | Wt 182.0 lb

## 2023-05-22 VITALS — BP 128/78 | HR 67 | Temp 97.3°F | Resp 18 | Ht 65.0 in | Wt 181.8 lb

## 2023-05-22 DIAGNOSIS — R0602 Shortness of breath: Secondary | ICD-10-CM

## 2023-05-22 DIAGNOSIS — J3089 Other allergic rhinitis: Secondary | ICD-10-CM | POA: Diagnosis not present

## 2023-05-22 DIAGNOSIS — I350 Nonrheumatic aortic (valve) stenosis: Secondary | ICD-10-CM

## 2023-05-22 DIAGNOSIS — I493 Ventricular premature depolarization: Secondary | ICD-10-CM

## 2023-05-22 DIAGNOSIS — K219 Gastro-esophageal reflux disease without esophagitis: Secondary | ICD-10-CM

## 2023-05-22 DIAGNOSIS — T7800XA Anaphylactic reaction due to unspecified food, initial encounter: Secondary | ICD-10-CM

## 2023-05-22 DIAGNOSIS — I5032 Chronic diastolic (congestive) heart failure: Secondary | ICD-10-CM | POA: Diagnosis not present

## 2023-05-22 DIAGNOSIS — T7800XD Anaphylactic reaction due to unspecified food, subsequent encounter: Secondary | ICD-10-CM | POA: Diagnosis not present

## 2023-05-22 DIAGNOSIS — J4550 Severe persistent asthma, uncomplicated: Secondary | ICD-10-CM | POA: Diagnosis not present

## 2023-05-22 DIAGNOSIS — J339 Nasal polyp, unspecified: Secondary | ICD-10-CM

## 2023-05-22 DIAGNOSIS — R0609 Other forms of dyspnea: Secondary | ICD-10-CM

## 2023-05-22 DIAGNOSIS — Z87898 Personal history of other specified conditions: Secondary | ICD-10-CM | POA: Diagnosis not present

## 2023-05-22 DIAGNOSIS — I2510 Atherosclerotic heart disease of native coronary artery without angina pectoris: Secondary | ICD-10-CM

## 2023-05-22 DIAGNOSIS — J324 Chronic pansinusitis: Secondary | ICD-10-CM

## 2023-05-22 DIAGNOSIS — I10 Essential (primary) hypertension: Secondary | ICD-10-CM

## 2023-05-22 MED ORDER — AEROCHAMBER MV MISC: 1.0000 | Freq: Every day | 1 refills | Status: AC

## 2023-05-22 MED ORDER — FLUTICASONE PROPIONATE 50 MCG/ACT NA SUSP: 1.0000 | Freq: Every day | NASAL | 1 refills | Status: DC

## 2023-05-22 MED ORDER — MONTELUKAST SODIUM 10 MG PO TABS: 10.0000 mg | ORAL_TABLET | Freq: Every evening | ORAL | 1 refills | Status: DC

## 2023-05-22 MED ORDER — IPRATROPIUM-ALBUTEROL 0.5-2.5 (3) MG/3ML IN SOLN: 3.0000 mL | Freq: Four times a day (QID) | RESPIRATORY_TRACT | 1 refills | Status: DC | PRN

## 2023-05-22 MED ORDER — LEVOCETIRIZINE DIHYDROCHLORIDE 5 MG PO TABS: 5.0000 mg | ORAL_TABLET | Freq: Every day | ORAL | 1 refills | Status: DC | PRN

## 2023-05-22 MED ORDER — NEBULIZER MASK ADULT MISC: 1.0000 | 1 refills | Status: AC

## 2023-05-22 MED ORDER — ALBUTEROL SULFATE HFA 108 (90 BASE) MCG/ACT IN AERS: 2.0000 | INHALATION_SPRAY | RESPIRATORY_TRACT | 1 refills | Status: DC | PRN

## 2023-05-22 MED ORDER — BREZTRI AEROSPHERE 160-9-4.8 MCG/ACT IN AERO: 2.0000 | INHALATION_SPRAY | Freq: Two times a day (BID) | RESPIRATORY_TRACT | 1 refills | Status: DC

## 2023-05-22 MED ORDER — METOPROLOL SUCCINATE ER 50 MG PO TB24: 50.0000 mg | ORAL_TABLET | Freq: Every day | ORAL | 3 refills | Status: DC

## 2023-05-22 MED ORDER — OMEPRAZOLE 40 MG PO CPDR: 40.0000 mg | DELAYED_RELEASE_CAPSULE | Freq: Every day | ORAL | 5 refills | Status: DC

## 2023-05-22 NOTE — Patient Instructions (Signed)
Medication Instructions:  The current medical regimen is effective;  continue present plan and medications as directed. Please refer to the Current Medication list given to you today.  *If you need a refill on your cardiac medications before your next appointment, please call your pharmacy*  Lab Work: BMET TODAY If you have labs (blood work) drawn today and your tests are completely normal, you will receive your results only by:  MyChart Message (if  you have MyChart) OR  A paper copy in the mail If you have any lab test that is abnormal or we need to change your treatment, we will call you to review the results.  Testing/Procedures: Your physician has requested that you have an echocardiogram. Echocardiography is a painless test that uses sound waves to create images of your heart. It provides your doctor with information about the size and shape of your heart and how well your heart's chambers and valves are working. This procedure takes approximately one hour. There are no restrictions for this procedure. Please do NOT wear cologne, perfume, aftershave, or lotions (deodorant is allowed). Please arrive 15 minutes prior to your appointment time.  Please note: We ask at that you not bring children with you during ultrasound (echo/ vascular) testing. Due to room size and safety concerns, children are not allowed in the ultrasound rooms during exams. Our front office staff cannot provide observation of children in our lobby area while testing is being conducted. An adult accompanying a patient to their appointment will only be allowed in the ultrasound room at the discretion of the ultrasound technician under special circumstances. We apologize for any inconvenience.    Follow-Up: At [facility], you and your health needs are our priority.  As part of our continuing mission to provide you with exceptional heart care, we have created designated Provider Care Teams.  These Care Teams include  your primary Cardiologist (physician) and Advanced Practice Providers (APPs -  Physician Assistants and Nurse Practitioners) who all work together to provide you with the care you need, when you need it.  Your next appointment:   KEEP SCHEDULED APPOINTMENT   Provider:   Edd Fabian, FNP-C Other Instructions

## 2023-05-22 NOTE — Patient Instructions (Signed)
  1.  Allergen avoidance measures - dust mite, cat, dog, mammal consumption  2.  Continue to treat and prevent inflammation:  A. RESTART Breztri - 2 inhalations 1-2 times per day w/spacer (empty lungs) B. Flonase - 2 sprays each nostril 1-7 times per week C. Montelukast 10 mg - 1 tablet 1 time per day   3.  Continue to treat and prevent reflux/LPR:  A. Omeprazole 40 mg - 1 tablet 3-7 times per week  4.  If needed:  A. Albuterol MDI or nebulization B. Nasal saline C. Antihistamine D. Epi-Pen  5. Will discuss with Tammy about a replacement for Benralizumab   6.  Return to clinic in 6 months or earlier if problem

## 2023-05-22 NOTE — Progress Notes (Unsigned)
[place] - [place] - [place] - [place] [facility] [place]   Follow-up Note  Referring Provider: Irena Reichmann, DO Primary Provider: Irena Reichmann, DO Date of Office Visit: 05/22/2023  Subjective:   Megan Hull (DOB: 1941/03/09) is a 82 y.o. female who [facility] in re-evaluation of the following:  HPI: Megan Hull returns to this clinic in evaluation of asthma, allergic rhinitis, chronic sinusitis, nasal polyposis, LPR, alpha gal syndrome.  I last saw her in this clinic 21 November 2022.  She was really doing very well regarding control of her asthma while using benralizumab and Breztri and montelukast and she was doing well with her upper airway while using Flonase.  She did not require systemic steroid or antibiotic and she rarely uses short acting bronchodilator.  Unfortunately, several weeks ago she contracted an upper respiratory tract infection that was associated with lots of ugly stuff coming out of her head and coughing it up and she felt bad in general and she took some amoxicillin and then she developed "craters" in her tongue and she discontinued her Breztri over the course of the past 2 and half weeks.  She has had some coughing and she has had some wheezing since that point in time.  Her reflux has been under pretty good control.  Currently she has no problems with her nose.  She does not consume mammal.  She has obtained the flu vaccine and the COVID-vaccine.  She has been informed by [employer] that she no longer qualifies for patient assistance for her benralizumab.  Her last injection was in the past week.  Allergies as of 05/22/2023       Reactions   Nexium [esomeprazole Magnesium] Anaphylaxis   Pt states she was unable to swallow for several hours after taking the nexium   Ace Inhibitors Swelling   Angioedema   Codeine Other (See Comments)   REACTION: chest pain   Monosodium Glutamate Other (See Comments)    dizziness   Other Hives, Swelling, Other (See Comments)   Red Fish (Hives) Bojangle's chicken (swelling and shortness of breath) PAPRIKA   Amlodipine Other (See Comments)   Lower extremity swelling, itching   Latex Rash   Causes skin to turn red per patient    Shellfish Allergy Hives   Tape Rash   Coban wrap turned her arm red (after stress test)        Medication List    AeroChamber MV inhaler 1 each by Other route daily. Use as instructed   albuterol 108 (90 Base) MCG/ACT inhaler Commonly known as: VENTOLIN HFA Inhale 2 puffs into the lungs every 4 (four) hours as needed for wheezing or shortness of breath.   amiodarone 200 MG tablet Commonly known as: PACERONE Take 0.5 tablets (100 mg total) by mouth daily.   aspirin EC 81 MG tablet Take 1 tablet (81 mg total) by mouth daily. Swallow whole.   Breztri Aerosphere 160-9-4.8 MCG/ACT Aero Generic drug: Budeson-Glycopyrrol-Formoterol 2 puffs 1-2 times per day   budesonide 0.25 MG/2ML nebulizer solution Commonly known as: PULMICORT Take 2 mLs (0.25 mg total) by nebulization 2 (two) times daily as needed (shortness of breath, wheezing).   cetirizine 10 MG tablet Commonly known as: ZYRTEC TAKE 1 TABLET 2 (TWO) TIMES DAILY AS NEEDED FOR ALLERGIES (CAN USE AN EXTRA DOSE DURING FLARE UPS.)   EPINEPHrine 0.3 mg/0.3 mL Soaj injection Commonly known as: EPI-PEN Inject 0.3 mg into the muscle as needed (anaphylaxis).  famotidine 40 MG tablet Commonly known as: PEPCID TAKE 1 TABLET AT BEDTIME   Fasenra 30 MG/ML prefilled syringe Generic drug: benralizumab Inject 1 mL (30 mg total) into the skin every 8 (eight) weeks.   Fluad Quadrivalent 0.5 ML injection Generic drug: influenza vaccine adjuvanted Inject 0.5 mLs into the muscle once.   fluticasone 50 MCG/ACT nasal spray Commonly known as: Flonase Place 1 spray into both nostrils daily.   furosemide 40 MG tablet Commonly known as: Lasix Take 1 tablet (40 mg) daily  and an extra half tablet (20 mg) daily as needed.   hydrALAZINE 25 MG tablet Commonly known as: APRESOLINE Take 0.5 tablets (12.5 mg total) by mouth 3 (three) times daily as needed (12.5 mg up to three times daily as needed for Systolic BP greater than 160).   ipratropium-albuterol 0.5-2.5 (3) MG/3ML Soln Commonly known as: DUONEB Take 3 mLs by nebulization every 6 (six) hours as needed (wheezing, shortness of breath).   isosorbide mononitrate 30 MG 24 hr tablet Commonly known as: IMDUR Take 1 tablet (30 mg total) by mouth daily.   metoprolol succinate 50 MG 24 hr tablet Commonly known as: TOPROL-XL Take 1 tablet (50 mg total) by mouth at bedtime. Take with or immediately following a meal.   montelukast 10 MG tablet Commonly known as: SINGULAIR Take 1 tablet (10 mg total) by mouth at bedtime.   omeprazole 40 MG capsule Commonly known as: PRILOSEC 1 tablet 3-7 times per week.   potassium chloride SA 20 MEQ tablet Commonly known as: KLOR-CON M Take 1 tablet (20 mEq total) by mouth daily.   PROBIOTIC DAILY PO Take 1 capsule by mouth daily.   simvastatin 40 MG tablet Commonly known as: ZOCOR Take 1 tablet (40 mg total) by mouth daily.   Spikevax injection Generic drug: COVID-19 mRNA vaccine (Moderna, >/= 24yrs) Inject 0.5 mLs into the muscle once.     Past Medical History:  Diagnosis Date   Arthritis    CHF (congestive heart failure) (HCC)    Diabetes mellitus without complication (HCC)    borderline   Diarrhea    Diverticulosis    Dysrhythmia    controlled with Metoprolol-12-19-13 LOV -Dr. Allyson Sabal sees yearly   Family history of premature CAD    GERD (gastroesophageal reflux disease)    Heart murmur    yrs ago   HLD (hyperlipidemia)    pt reports cholesterol normal now (07/21/19).   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 Erlene Quan)   CATARACT EXTRACTION, BILATERAL     most recent 11/19   COLONOSCOPY     ESOPHAGEAL DILATION  2014   infected lymph node surgery  80's   NM MYOCAR PERF WALL MOTION  04/2011   bruce myoview; normal pattern of perfusion, EF 80%, low risk scan   SINUS ENDO WITH FUSION Bilateral 03/04/2020   Procedure: SINUS ENDOSCOPY WITH FUSION NAVIGATION;  Surgeon: Osborn Coho, MD;  Location: [facility];  Service: ENT;  Laterality: Bilateral;   TONSILLECTOMY  70's   TRANSTHORACIC ECHOCARDIOGRAM  12/2007   mild conc LVH   TURBINATE REDUCTION Bilateral 03/04/2020   Procedure: TURBINATE REDUCTION;  Surgeon: Osborn Coho, MD;  Location: [facility];  Service: ENT;  Laterality: Bilateral;    Review of systems negative except as noted in HPI / PMHx or noted below:  Review of Systems  Constitutional: Negative.   HENT: Negative.    Eyes: Negative.   Respiratory: Negative.    Cardiovascular: Negative.   Gastrointestinal: Negative.   Genitourinary: Negative.   Musculoskeletal: Negative.   Skin: Negative.   Neurological: Negative.   Endo/Heme/Allergies: Negative.   Psychiatric/Behavioral: Negative.       Objective:   Vitals:   05/22/23 1051  BP: 128/78  Pulse: 67  Resp: 18  Temp: (!) 97.3 F (36.3 C)  SpO2: 96%   Height: 5\' 5"  (165.1 cm)  Weight: 181 lb 12.8 oz (82.5 kg)   Physical Exam Constitutional:      Appearance: She is not diaphoretic.  HENT:     Head: Normocephalic.     Right Ear: Tympanic membrane, ear canal and external ear normal.     Left Ear: Tympanic membrane, ear  canal and external ear normal.     Nose: Nose normal. No mucosal edema or rhinorrhea.     Mouth/Throat:     Pharynx: Uvula midline. No oropharyngeal exudate.  Eyes:     Conjunctiva/sclera: Conjunctivae normal.  Neck:     Thyroid: No thyromegaly.     Trachea: Trachea normal. No tracheal tenderness or tracheal deviation.  Cardiovascular:     Rate and Rhythm: Normal rate and regular rhythm.     Heart sounds: Normal heart sounds, S1 normal and S2 normal. No murmur heard. Pulmonary:     Effort: No respiratory distress.     Breath sounds: Normal breath sounds. No stridor. No wheezing or rales.  Lymphadenopathy:     Head:     Right side of head: No tonsillar adenopathy.     Left side of head: No tonsillar adenopathy.     Cervical: No cervical adenopathy.  Skin:    Findings: No erythema or rash.     Nails: There is no clubbing.  Neurological:     Mental Status: She is alert.     Diagnostics: Spirometry was performed and demonstrated an FEV1 of 1.30 at 79 % of predicted.  Assessment and Plan:   1. Not well controlled severe persistent asthma   2. Perennial allergic rhinitis   3. Chronic pansinusitis   4. 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 LPRD (laryngopharyngeal reflux disease)     1.  Allergen avoidance measures - dust mite, cat, dog, mammal consumption  2.  Continue to treat and prevent inflammation:  A. RESTART Breztri - 2 inhalations 1-2 times per day w/spacer (empty lungs) B. Flonase - 2 sprays each nostril 1-7 times per week C. Montelukast 10 mg - 1 tablet 1 time per day   3.  Continue to treat and prevent reflux/LPR:  A. Omeprazole 40 mg - 1 tablet 3-7 times per week  4.  If needed:  A. Albuterol MDI or nebulization B. Nasal saline C. Antihistamine D. Epi-Pen  5. Will discuss with Tammy about a replacement for Benralizumab   6.  Return to clinic in 6 months or earlier if problem  Tomma will restart her Markus Daft and I suspect  within a week most of her respiratory tract symptoms will  return to her baseline level.  We need to do something about her benralizumab injections.  She no longer qualifies for patient assistance and cannot afford these injections and we will see if there is an a alternative biologic agent that would be more financially advantageous.  She will remain on a collection of anti-inflammatory agents for airway and therapy directed against reflux as noted above.  We will contact her about alternate biologic agent.  Laurette Schimke, MD Allergy / Immunology [facility]

## 2023-05-23 ENCOUNTER — Encounter: Payer: Self-pay | Admitting: Allergy and Immunology

## 2023-05-23 ENCOUNTER — Telehealth: Payer: Self-pay | Admitting: *Deleted

## 2023-05-23 LAB — BASIC METABOLIC PANEL
BUN/Creatinine Ratio: 14 (ref 12–28)
BUN: 17 mg/dL (ref 8–27)
CO2: 27 mmol/L (ref 20–29)
Calcium: 8.9 mg/dL (ref 8.7–10.3)
Chloride: 100 mmol/L (ref 96–106)
Creatinine, Ser: 1.19 mg/dL — ABNORMAL HIGH (ref 0.57–1.00)
Glucose: 88 mg/dL (ref 70–99)
Potassium: 4.2 mmol/L (ref 3.5–5.2)
Sodium: 139 mmol/L (ref 134–144)
eGFR: 45 mL/min/{1.73_m2} — ABNORMAL LOW (ref >59)

## 2023-05-23 NOTE — Telephone Encounter (Signed)
L/M for patient to discuss changes with medicare and affordability for bios for 2025

## 2023-05-23 NOTE — Telephone Encounter (Signed)
-----   Message from ERIC J KOZLOW sent at 05/22/2023  5:04 PM EST ----- With insurance coverage, which one would have the lowest out of pocket expense? ----- Message ----- From: Devoria Glassing, CMA Sent: 05/22/2023   3:19 PM EST To: Jessica Priest, MD  She will not meet financial criteria for any PAP program since her income doesn't fall below the 300% [employer] Sholom Dulude ----- Message ----- From: Jessica Priest, MD Sent: 05/22/2023  11:35 AM EST To: Devoria Glassing, CMA  Will [place] have coverage that [place] will no longer provide?

## 2023-05-23 NOTE — Telephone Encounter (Signed)
Spoke to patient and advised Medicare part D changes for next year and affordabilty for biologic therapies

## 2023-05-30 ENCOUNTER — Other Ambulatory Visit: Payer: Medicare HMO

## 2023-05-31 ENCOUNTER — Ambulatory Visit: Payer: Medicare HMO

## 2023-05-31 NOTE — Progress Notes (Signed)
Patient here c/o pressure from shoe on Left bunion  Patient is leaving shoe here I will leave it on stretcher for one week patient set return appt on 12/26 11:45 to come back and try on  Megan Hull CPed, CFo CFm

## 2023-06-07 ENCOUNTER — Ambulatory Visit: Payer: Medicare HMO

## 2023-06-07 NOTE — Progress Notes (Signed)
Patient was here to try on shoe that we have been stretching in bunion area for about a week patient is happy with result she will wear and call office if any issues arise  Nicki Guadalajara

## 2023-06-14 ENCOUNTER — Other Ambulatory Visit: Payer: Self-pay | Admitting: Family Medicine

## 2023-06-14 ENCOUNTER — Other Ambulatory Visit: Payer: Self-pay | Admitting: *Deleted

## 2023-06-14 DIAGNOSIS — Z1231 Encounter for screening mammogram for malignant neoplasm of breast: Secondary | ICD-10-CM

## 2023-06-14 MED ORDER — FASENRA 30 MG/ML ~~LOC~~ SOSY: 30.0000 mg | PREFILLED_SYRINGE | SUBCUTANEOUS | 6 refills | Status: DC

## 2023-06-21 ENCOUNTER — Ambulatory Visit (HOSPITAL_COMMUNITY): Payer: Medicare HMO | Attending: Cardiology

## 2023-06-21 DIAGNOSIS — I350 Nonrheumatic aortic (valve) stenosis: Secondary | ICD-10-CM | POA: Diagnosis not present

## 2023-06-21 LAB — ECHOCARDIOGRAM COMPLETE
AR max vel: 0.93 cm2
AV Area VTI: 0.85 cm2
AV Area mean vel: 0.98 cm2
AV Mean grad: 14 mm[Hg]
AV Peak grad: 28.1 mm[Hg]
Ao pk vel: 2.65 m/s
Area-P 1/2: 3.12 cm2
S' Lateral: 2.3 cm

## 2023-07-10 NOTE — Progress Notes (Addendum)
 Cardiology Clinic Note   Patient Name: Megan Hull Date of Encounter: 07/19/2023  Primary Care Provider:  Gerome Brunet, DO Primary Cardiologist:  Dorn Lesches, MD  Patient Profile    Megan Hull 83 year old female presents to the clinic today for follow-up evaluation of her  Chronic diastolic CHF and aortic valve stenosis  Past Medical History    Past Medical History:  Diagnosis Date   Arthritis    CHF (congestive heart failure) (HCC)    Diabetes mellitus without complication (HCC)    borderline   Diarrhea    Diverticulosis    Dysrhythmia    controlled with Metoprolol -12-19-13 LOV -Dr. Lesches sees yearly   Family history of premature CAD    GERD (gastroesophageal reflux disease)    Heart murmur    yrs ago   HLD (hyperlipidemia)    pt reports cholesterol normal now (07/21/19).   Hypertension    Lower extremity edema    Status post dilation of esophageal narrowing    Supraventricular arrhythmia    Tobacco abuse    Past Surgical History:  Procedure Laterality Date   ANTERIOR CERVICAL DECOMP/DISCECTOMY FUSION N/A 06/08/2015   Procedure: CERVICAL FIVE-SIX ,CERVICAL SIX-SEVEN ANTERIOR CERVICAL DECOMPRESSION/DISCECTOMY FUSION PLATING BONEGRAFT;  Surgeon: Victory Gens, MD;  Location: [facility];  Service: Neurosurgery;  Laterality: N/A;   CARDIAC CATHETERIZATION  01/15/2009   non-critical CAD, 60% mid LAD lesion (Dr. DOROTHA Lesches)   CATARACT EXTRACTION, BILATERAL     most recent 11/19   COLONOSCOPY     ESOPHAGEAL DILATION  2014   infected lymph node surgery  80's   NM MYOCAR PERF WALL MOTION  04/2011   bruce myoview ; normal pattern of perfusion, EF 80%, low risk scan   SINUS ENDO WITH FUSION Bilateral 03/04/2020   Procedure: SINUS ENDOSCOPY WITH FUSION NAVIGATION;  Surgeon: Mable Lenis, MD;  Location: [facility];  Service: ENT;  Laterality: Bilateral;   TONSILLECTOMY  70's   TRANSTHORACIC ECHOCARDIOGRAM  12/2007   mild conc LVH    TURBINATE REDUCTION Bilateral 03/04/2020   Procedure: TURBINATE REDUCTION;  Surgeon: Mable Lenis, MD;  Location: [facility];  Service: ENT;  Laterality: Bilateral;    Allergies  Allergies  Allergen Reactions   Nexium  [Esomeprazole  Magnesium ] Anaphylaxis    Pt states she was unable to swallow for several hours after taking the nexium    Ace Inhibitors Swelling    Angioedema    Codeine Other (See Comments)    REACTION: chest pain   Monosodium Glutamate Other (See Comments)    dizziness   Other Hives, Swelling and Other (See Comments)    Hull Fish (Hives) Bojangle's chicken (swelling and shortness of breath) PAPRIKA   Amlodipine  Other (See Comments)    Lower extremity swelling, itching   Latex Rash    Causes skin to turn Hull per patient    Shellfish Allergy Hives   Tape Rash    Coban wrap turned her arm Hull (after stress test)    History of Present Illness    Megan Hull has a PMH of essential hypertension, coronary artery disease, chronic diastolic CHF, SVT, esophageal reflux, DJD, dyslipidemia, lower extremity numbness, lower extremity edema, dyspnea, and left foot drop.  Cardiac catheterization 812 showed 60% mid LAD narrowing.  Angioedema secondary to ACE inhibitor.     She was seen by Dr. Lesches 12/20.  An echocardiogram 12/19 and nuclear stress test 1/20 after her complaints of neck pain and dyspnea on exertion showed  low risk.  Her echocardiogram showed an LVEF of 60-65%, mild LVH and G1 DD.  She was also evaluated by pulmonary and was not felt to have contributed COPD.   When she last presented to the clinic on 07/30/2019 she brought blood pressure readings that showed consistent elevated systolic blood pressure 140-160 systolic.  She brought her blood pressure monitor from home which showed an increase in systolic pressure by around 40 points.  Her blood pressure in the office was 120s over 60s.  She was grateful to discover this.  She was doing well from a  cardiac standpoint.  She continued to have chronic dyspnea on exertion she had been prescribed albuterol  however had not used the medication.  She had not had any recurrent episodes of PSVT.   She presented to the clinic 12/03/2019 for follow-up evaluation and stated she felt well.  She stated that she  had episodes where her blood pressure was in the 115's over 60s and 70s and she had taken her hydralazine  which further dropped down her blood pressure and maked her feel not well.  She requested that she have 25 mg tablets to be able to take in the evening when her blood pressure is lower.  She continuee to be very physically active in her church and followed a heart healthy diet.  She brought a blood pressure log  that showed well-controlled blood pressure.  She stated that she had an appointment with Dr. Court in August for clearance for her sinus surgery.  I  gave her a prescription for an additional 25 mg of hydralazine , gave her the salty 6 diet sheet, and had her follow-up as scheduled.   She was seen by Dr. Court 02/10/2020 in follow-up.  She continued to do well.  She did complain of allergies and sinus drainage.  Her sinus surgery was discussed and deemed to be a safe procedure from a cardiac standpoint.  Her blood pressure was well managed at that time.   She presented to the clinic 08/09/2020 for follow-up evaluation and stated around 1 month ago she had asthma exacerbation that required nebulizer treatments and steroids.  She reported that the treatment greatly improved her breathing.  However currently she was still working to get over her exacerbation.  She was taking nebulizer treatments when she noticed wheezing.  She was managing her blood pressure with her hydralazine .  Her carvedilol  was held by her PCP and she was taking 12.5 mg when her heart rate increased to 95.  I agreed with the change at the current time.  I  had her continue her current medication regimen.  She reported that she was  going to [place]  for the entire month of May and she would not be available for follow-up until the beginning of June.  I  gave her the salty 6 diet sheet, had her maintain her blood pressure log and follow-up early June.  Planned to repeat her fasting lipid panel at that time.   She presented to the [facility] emergency department 08/28/2020 with complaints of lower extremity swelling.  Her blood pressure was 140/74 with pulse of 67.  Her furosemide  was increased to 40 mg twice daily x2 days.  She was then instructed to resume her 40 mg morning and 20 mg nightly dosing.  Lower extremity compression stockings, low-salt diet were reviewed.  She was given 40 mg IV Lasix  x1.  CBC/CMP/troponins unremarkable.  She reported that her symptoms had improved and her neck pain had  resolved after IV diuresis, lidocaine  patch, and Tylenol .  She was discharged in stable condition.   She presented to the clinic 11/17/2020 for follow-up evaluation stated she was having more difficulty with routine daily activities.  She reported that she noticed increased shortness of breath with increased physical activity.  Her weight was up about 3 pounds from her dry weight.  She felt that she was not urinating as much with her furosemide .  She continued to follow a heart healthy low-sodium diet, wear her support stockings, and limit her fluid intake.  I will prescribe torsemide  20 mg in a.m. and 10 mg in the p.m.   Her furosemide  was stopped.  I  ordered a BMP in 1 week.   She contacted the nurse triage line on 03/01/2021.  She reported having COVID on 8/23.  During the time of the call, she reported low blood pressures with systolic pressures ranging in the 109-112 range with heart rates in the 50s.  I instructed her to decrease her carvedilol  to 6.25 mg daily, continue her Imdur , and only take hydralazine  for systolic blood pressure greater than 150.   She presented the clinic 03/08/2021 for follow-up evaluation stated she has noticed that her  blood pressure was  higher in the mornings.  Her blood pressure log shows blood pressures starting now in the low 100s and increasing over the weekend to the mid 140s and occasionally in the 160s.  She states that she can tell when her blood pressure is elevated due to a hot left year.  We will increase her carvedilol  to 12.5 mg in the morning and continue her 6.25 mg afternoon/evening dose.    She was seen in follow-up by Dr. Court on 11/16/2021.  She was stable from a cardiac standpoint at that time.  She had been hospitalized in March due to questionable TIA.  She was seen by EP for palpitations.  She wore a cardiac event monitor which showed frequent PVCs with a 20% burden.  She was started on mexiletine and did not tolerate the medication.  Mexiletine was discontinued and she was placed on amiodarone .  She was seen in follow-up by Riner C PA-C on 01/18/2022.  She was doing well at that time.  She reported new and frequent stuttering breath.  She denied shortness of breath.  It was not felt that her symptoms were related to amiodarone  or cardiac in nature.  She was advised to follow-up with her PCP.  She denied cough, shortness of breath, dyspnea and chest pain.    She presented to the clinic 07/14/22 for follow-up evaluation and stated she occasionally noticed elevated blood pressures in the mornings.  She noticed her blood pressures were more elevated when she retained more fluid.  She was instructed by her PCP to only take 40 mg of furosemide  daily due to her increased creatinine.  We reviewed the importance of balancing hydration and medication.  She expressed understanding.  Her blood pressure initially was 144/62 and on recheck was 134/82.  I  changed her hydralazine  prescription to 12.5 mg 3 times daily as needed for systolic blood pressure greater than 160.  I asked her to maintain her blood pressure log.  She reported that she was traveling to [facility] to stay during [employer] time.  She was excited  about the trip.  We reviewed her echocardiogram.  She expressed understanding.  I  planned follow-up in 6 months, I refilled her amiodarone .  She was seen in follow-up  by Jodie Passey, PA-C on 10-24.  Her TSH was normal.  Her free T4 was slightly elevated at 1.85.  Her CMP showed creatinine of 1.28 and EGFR of 42.  She was stable from a cardiac standpoint.  She did note mild shortness of breath after walking 1.5 blocks.  She was exercising every other day and was active in church.  She was able to walk her basement stairs without difficulty.  She denied chest pain orthopnea and PND.  Follow-up was planned for 6 months.  She contacted the nurse triage line on 05/12/2023.  She reported a weight gain from 165 pounds to 181 pounds.  She noted compliance with her lower extremity compression stockings.  She was taking an extra furosemide  (20 mg) every other day.  She inquired about taking a GLP-1 medication such as Ozempic for her weight.  I increased her furosemide  to 80 mg x 3 days and added 20 mill equivalents of potassium x 3 days.  Office visit was scheduled.  She presented to the clinic 05/22/23 for follow-up evaluation and stated she felt that over the last 2 to 3 years her weight had steadily increased.  She noted that she was no longer able to wear some of her close.  Her weight was 1 pound greater than it was in October 2024.  We reviewed her increase in fluid volume and 3-day diuresis.  She reported compliance with her medications and low-sodium diet.  She was staying physically active working with her church.  We discussed GLP-1 medication.  At that time I continued her current medication, repeated her echocardiogram, asked her to increase her physical activity, and planned follow-up after echocardiogram.  Echocardiogram 06/21/2023 showed LVEF of 70-75%, intermediate diastolic parameters, elevated LVEDP, trivial mitral valve regurgitation and trivial aortic valve regurgitation with moderate aortic valve  stenosis  She presents to the clinic today for follow-up evaluation and states she continues to be challenged by weight gain.  Her weight today is 186 pounds.  She appears to be euvolemic.  She reports taking allergy injections every 6 weeks.  She is somewhat active working at [employer], walking and doing balance and stretching type classes.  We reviewed her echocardiogram.  She expressed understanding.  I encouraged resistance type activities.  I will order a CBC today, refer to [facility] for diet and resistance training instruction, continue her current medication regimen and plan follow-up in 6 months.  I will also refer to pulmonology for PFTs due to her increased DOE.   Today she denies chest pain, shortness of breath, lower extremity edema, fatigue, palpitations, melena, hematuria, hemoptysis, diaphoresis, weakness, presyncope, syncope, orthopnea, and PND.    Home Medications    Prior to Admission medications   Medication Sig Start Date End Date Taking? Authorizing Provider  albuterol  (VENTOLIN  HFA) 108 (90 Base) MCG/ACT inhaler Inhale 2 puffs into the lungs every 4 (four) hours as needed for wheezing or shortness of breath. 05/16/22   Kozlow, Eric J, MD  Alum & Mag Hydroxide-Simeth (ANTACID ADVANCED PO) Take 1 tablet by mouth daily as needed (indigestion).     [provider]  amiodarone  (PACERONE ) 200 MG tablet Take 1 tablet (200 mg total) by mouth daily. 11/25/21   Passey Ozell Barter, PA-C  aspirin  EC 81 MG tablet Take 1 tablet (81 mg total) by mouth daily. Swallow whole. 09/03/20   Emelia Josefa HERO, NP  Budeson-Glycopyrrol-Formoterol  (BREZTRI  AEROSPHERE) 160-9-4.8 MCG/ACT AERO Inhale 2 puffs into the lungs in the morning and at bedtime.  05/16/22   Kozlow, Camellia PARAS, MD  budesonide  (PULMICORT ) 0.25 MG/2ML nebulizer solution Take 2 mLs (0.25 mg total) by nebulization 2 (two) times daily as needed (shortness of breath, wheezing). 01/17/22   Kozlow, Camellia PARAS, MD  cetirizine  (ZYRTEC ) 10 MG  tablet Take 10 mg by mouth daily.    [provider]  EPINEPHrine  0.3 mg/0.3 mL IJ SOAJ injection Inject 0.3 mg into the muscle as needed (anaphylaxis). 05/16/22   Kozlow, Camellia PARAS, MD  famotidine  (PEPCID ) 40 MG tablet Take 1 tablet (40 mg total) by mouth at bedtime. 01/17/22   Kozlow, Camellia PARAS, MD  FLUAD QUADRIVALENT 0.5 ML injection Inject 0.5 mLs into the muscle once. 03/21/22   [provider]  fluticasone  (FLONASE ) 50 MCG/ACT nasal spray Place 1 spray into both nostrils daily. 05/16/22   Kozlow, Camellia PARAS, MD  furosemide  (LASIX ) 40 MG tablet Take 1.5 tablets (60 mg total) by mouth daily. 11/16/21   Court Dorn PARAS, MD  hydrALAZINE  (APRESOLINE ) 25 MG tablet TAKE 1 TABLET THREE TIMES DAILY AS NEEDED FOR SYSTOLIC BLOOD PRESSURE GREATER THAN 150 (NEED MD APPOINTMENT) 05/03/22   Court Dorn PARAS, MD  ipratropium-albuterol  (DUONEB) 0.5-2.5 (3) MG/3ML SOLN Take 3 mLs by nebulization every 6 (six) hours as needed (wheezing, shortness of breath). 05/16/22   Kozlow, Camellia PARAS, MD  isosorbide  mononitrate (IMDUR ) 30 MG 24 hr tablet Take 1 tablet (30 mg total) by mouth daily. 11/16/21   Court Dorn PARAS, MD  metoprolol  succinate (TOPROL -XL) 50 MG 24 hr tablet Take 1 tablet (50 mg total) by mouth at bedtime. Take with or immediately following a meal. 11/16/21   Court Dorn PARAS, MD  montelukast  (SINGULAIR ) 10 MG tablet Take 1 tablet (10 mg total) by mouth at bedtime. 05/16/22   Kozlow, Eric J, MD  Multiple Vitamins-Minerals (AIRBORNE PO) Take 1 tablet by mouth daily.    [provider]  omeprazole  (PRILOSEC) 40 MG capsule Take 1 capsule (40 mg total) by mouth every morning. 05/16/22   Kozlow, Eric J, MD  Probiotic Product (PROBIOTIC DAILY PO) Take 1 capsule by mouth daily.    [provider]  simvastatin  (ZOCOR ) 40 MG tablet TAKE 1 TABLET EVERY DAY 04/28/22   Court Dorn PARAS, MD  Spacer/Aero-Holding Chambers (AEROCHAMBER MV) inhaler 1 each by Other route daily. Use as instructed 09/27/21    Kozlow, Eric J, MD  SPIKEVAX injection Inject 0.5 mLs into the muscle once. 03/31/22   [provider]    Family History    Family History  Problem Relation Age of Onset   Heart disease Brother        MI @ 32, HTN @ 59, DM @ 3    Heart disease Father        MI   Heart disease Mother        MI, CVA @ 35   Heart disease Sister        CHF, CVA   Breast cancer Sister 22   Esophageal cancer Brother    Alzheimer's disease Sister    Heart disease Brother        MI in 91s, lung cancer   Heart disease Sister    Hypertension Sister        x2   Hyperlipidemia Sister        x2   Diabetes Sister        x2   Cancer Sister    Hypertension Brother    Colon cancer Neg Hx  She indicated that her mother is deceased. She indicated that her father is deceased. She indicated that two of her ten sisters are alive. She indicated that only one of her six brothers is alive. She indicated that her maternal grandmother is deceased. She indicated that her maternal grandfather is deceased. She indicated that her paternal grandmother is deceased. She indicated that her paternal grandfather is deceased. She indicated that the status of her neg hx is unknown.  Social History    Social History   Socioeconomic History   Marital status: Single    Spouse name: Not on file   Number of children: 0   Years of education: 12+   Highest education level: Not on file  Occupational History   Occupation: Reitred  Tobacco Use   Smoking status: Former    Current packs/day: 0.00    Average packs/day: 0.3 packs/day for 15.0 years (3.8 ttl pk-yrs)    Types: Cigarettes    Start date: 02/03/2001    Quit date: 02/04/2016    Years since quitting: 7.4   Smokeless tobacco: Never  Vaping Use   Vaping status: Never Used  Substance and Sexual Activity   Alcohol  use: Not Currently    Comment: occasional - tequila shots on holidays   Drug use: No   Sexual activity: Not on file  Other Topics Concern   Not  on file  Social History Narrative   Lives alone    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  Cardiovascular:  No chest pain, dyspnea on exertion, edema, orthopnea, palpitations, paroxysmal nocturnal dyspnea. Dermatological: No rash, lesions/masses Respiratory: No cough, dyspnea Urologic: No hematuria, dysuria Abdominal:   No nausea, vomiting, diarrhea, bright Hull blood per rectum, melena, or hematemesis Neurologic:  No visual changes, wkns, changes in mental status. All other systems reviewed and are otherwise negative except as noted above.  Physical Exam    VS:  BP 130/72 (BP Location: Left Arm, Patient Position: Sitting, Cuff Size: Normal)   Pulse 68   Ht 5' 5 (1.651 m)   Wt 186 lb (84.4 kg)   SpO2 94%   BMI 30.95 kg/m  , BMI Body mass index is 30.95 kg/m. GEN: Well nourished, well developed, in no acute distress. HEENT: normal. Neck: Supple, no JVD, carotid bruits, or masses. Cardiac: RRR, systolic murmur heard best along right sternal border , rubs, or gallops. No clubbing, cyanosis, edema.  Radials/DP/PT 2+ and equal bilaterally.  Respiratory:  Respirations regular and unlabored, clear to auscultation bilaterally. GI: Soft, nontender, nondistended, BS + x 4. MS: no deformity or atrophy. Skin: warm and dry, no rash. Neuro:  Strength and sensation are intact. Psych: Normal affect.  Accessory Clinical Findings    Recent Labs: 03/14/2023: ALT 23; Hemoglobin 13.3; Platelets 229; TSH 0.881 05/22/2023: BUN 17; Creatinine, Ser 1.19; Potassium 4.2; Sodium 139   Recent Lipid Panel    Component Value Date/Time    CHOL 116 09/02/2021 0216   TRIG 61 09/02/2021 0216   HDL 41 09/02/2021 0216   CHOLHDL 2.8 09/02/2021 0216   VLDL 12 09/02/2021  0216   LDLCALC 63 09/02/2021 0216         ECG personally reviewed by me today-none today.  Echocardiogram 07/01/2021    1. Left ventricular ejection fraction, by estimation, is 60 to 65%. The  left ventricle has normal function. The left ventricle has no regional  wall motion abnormalities. There is mild asymmetric left ventricular  hypertrophy of the septal and basal  segments. Left ventricular diastolic parameters were normal.   2. Right ventricular systolic function is normal. The right ventricular  size is normal.   3. Left atrial size was mildly dilated.   4. The mitral valve is degenerative. Trivial mitral valve regurgitation.  No evidence of mitral stenosis.   5. The aortic valve is normal in structure. There is moderate  calcification of the aortic valve. There is moderate thickening of the  aortic valve. Aortic valve regurgitation is trivial. Mild to moderate  aortic valve stenosis.   6. The inferior vena cava is normal in size with greater than 50%  respiratory variability, suggesting right atrial pressure of 3 mmHg.    Echocardiogram 06/21/2023  IMPRESSIONS     1. Left ventricular ejection fraction, by estimation, is 70 to 75%. Left  ventricular ejection fraction by 3D volume is 73 %. The left ventricle has  hyperdynamic function. The left ventricle has no regional wall motion  abnormalities. Left ventricular  diastolic parameters are indeterminate. Elevated left ventricular  end-diastolic pressure.   2. Right ventricular systolic function is normal. The right ventricular  size is normal.   3. The mitral valve is normal in structure. Trivial mitral valve  regurgitation. No evidence of mitral stenosis.   4. The aortic valve is calcified. There is moderate calcification of the  aortic valve. There is moderate thickening of the aortic  valve. Aortic  valve regurgitation is trivial. Moderate aortic valve stenosis. Aortic  valve area, by VTI measures 0.85 cm.  Aortic valve mean gradient measures 14.0 mmHg. Aortic valve Vmax measures  2.65 m/s. Although the mean AVG and V max are consistent with mild AS, the  SVI is low at 33 and DVI is low at 0.27. 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   5. The inferior vena cava is normal in size with greater than 50%  respiratory variability, suggesting right atrial pressure of 3 mmHg.   6. Compared to echo dated 06/27/2022, the mean AVG has increased form 11  to , V max has increased from 2.41m/s to 2.15m/s, AVA decreased from  1.34cm2 to 0.85cm2 (VTI) and DVI has decreased from 0.43 to 0.27.   FINDINGS   Left Ventricle: Left ventricular ejection fraction, by estimation, is 70  to 75%. Left ventricular ejection fraction by 3D volume is 73 %. The left  ventricle has hyperdynamic function. The left ventricle has no regional  wall motion abnormalities. The  left ventricular internal cavity size was normal in size. There is no left  ventricular hypertrophy. Left ventricular diastolic parameters are  indeterminate. Elevated left ventricular end-diastolic pressure.   Right Ventricle: The right ventricular size is normal. No increase in  right ventricular wall thickness. Right ventricular systolic function is  normal.   Left Atrium: Left atrial size was normal in size.   Right Atrium: Right atrial size was normal in size.   Pericardium: There is no evidence of pericardial effusion.   Mitral Valve: The mitral valve is normal in structure. Mild mitral annular  calcification. Trivial mitral  valve regurgitation. No evidence of mitral  valve stenosis.   Tricuspid Valve: The tricuspid valve is normal in structure. Tricuspid  valve regurgitation is trivial. No evidence of tricuspid stenosis.   Aortic Valve: The aortic valve is  calcified. There is moderate  calcification of the aortic valve. There is moderate thickening of the  aortic valve. Aortic valve regurgitation is trivial. Moderate aortic  stenosis is present. Aortic valve mean gradient  measures 14.0 mmHg. Aortic valve peak gradient measures 28.1 mmHg. Aortic  valve area, by VTI measures 0.85 cm.   Pulmonic Valve: The pulmonic valve was normal in structure. Pulmonic valve  regurgitation is trivial. No evidence of pulmonic stenosis.   Aorta: The aortic root is normal in size and structure.   Venous: The inferior vena cava is normal in size with greater than 50%  respiratory variability, suggesting right atrial pressure of 3 mmHg.   IAS/Shunts: No atrial level shunt detected by color flow Doppler.    06/14/2018: stress myoview  Nuclear stress EF: 73%. The left ventricular ejection fraction is hyperdynamic (>65%). There was no ST segment deviation noted during stress. The study is normal. This is a low risk study.   Normal stress nuclear study with no ischemia or infarction.  Gated ejection fraction 73% with normal wall motion.    Assessment & Plan   1.  Aortic valve stenosis- Echocardiogram showed moderate aortic valve stenosis. Increase physical activity as tolerated Plan for repeat echocardiogram 1/26  Chronic diastolic CHF-weight today 186 pounds.  Euvolemic.  Echocardiogram 06/21/23 reassuring.  Details above.  BMP showed stable renal function Continue hydralazine , Imdur , metoprolol , furosemide  Continue fluid restriction-reviewed Daily weights-contact office with a weight increase of 2 to 3 pounds overnight or 5 pounds in 1 week.   Essential hypertension-BP today 130/72 Maintain blood pressure log Continue isosorbide , metoprolol , hydralazine  Increase physical activity as tolerated-goal 150 minutes of moderate physical activity per week.  Coronary artery disease-no exertional chest discomfort.  Remains physically active working at [employer].   Cardiac catheterization 2012 showed 60% mid LAD stenosis.  With nuclear stress testing 1/20 which showed low risk and no ischemia. Continue current medical therapy  Palpitations, PVCs-heart rate today 68.  Following with Dr. Inocencio.  Previously did not tolerate mexiletine and was transitioned to amiodarone .   Continue amiodarone  Avoid triggers caffeine, chocolate, EtOH etc.  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  She did require 48 hours of intubation.  Subsequent allergy testing showed allergy to ARB/ACE and [place] meat.  DOE/shortness of breath-weight has increased to 186 pounds.  She remains fairly physically active.  She is doing stretching and balance classes 2 days/week.  She reports that she has a well-balanced diet. Refer to Greig Ruth for diet and resistance training instruction Refer to pulmonology for PFTs  Disposition: Follow-up with Dr.Berry or me in 6 months.   Josefa HERO. Belicia Difatta NP-C     07/19/2023, 12:16 PM [facility] [address] Office [phone] Fax [phone]    I spent 16 minutes examining this patient, reviewing medications, and using patient centered shared decision making involving her cardiac care.  I spent  20 minutes reviewing her past medical history,  medications, and prior cardiac tests.

## 2023-07-11 ENCOUNTER — Ambulatory Visit: Payer: Medicare HMO

## 2023-07-19 ENCOUNTER — Telehealth: Payer: Self-pay

## 2023-07-19 ENCOUNTER — Encounter: Payer: Self-pay | Admitting: General Practice

## 2023-07-19 ENCOUNTER — Ambulatory Visit: Payer: Medicare HMO | Attending: General Practice | Admitting: General Practice

## 2023-07-19 ENCOUNTER — Ambulatory Visit: Payer: Medicare HMO

## 2023-07-19 VITALS — BP 130/72 | HR 68 | Ht 65.0 in | Wt 186.0 lb

## 2023-07-19 DIAGNOSIS — R002 Palpitations: Secondary | ICD-10-CM | POA: Diagnosis not present

## 2023-07-19 DIAGNOSIS — I350 Nonrheumatic aortic (valve) stenosis: Secondary | ICD-10-CM

## 2023-07-19 DIAGNOSIS — R0609 Other forms of dyspnea: Secondary | ICD-10-CM | POA: Diagnosis not present

## 2023-07-19 DIAGNOSIS — M1990 Unspecified osteoarthritis, unspecified site: Secondary | ICD-10-CM

## 2023-07-19 DIAGNOSIS — J4550 Severe persistent asthma, uncomplicated: Secondary | ICD-10-CM

## 2023-07-19 DIAGNOSIS — I2510 Atherosclerotic heart disease of native coronary artery without angina pectoris: Secondary | ICD-10-CM

## 2023-07-19 DIAGNOSIS — I10 Essential (primary) hypertension: Secondary | ICD-10-CM

## 2023-07-19 DIAGNOSIS — E785 Hyperlipidemia, unspecified: Secondary | ICD-10-CM

## 2023-07-19 DIAGNOSIS — Z87898 Personal history of other specified conditions: Secondary | ICD-10-CM

## 2023-07-19 DIAGNOSIS — I493 Ventricular premature depolarization: Secondary | ICD-10-CM | POA: Diagnosis not present

## 2023-07-19 DIAGNOSIS — R0602 Shortness of breath: Secondary | ICD-10-CM

## 2023-07-19 DIAGNOSIS — R531 Weakness: Secondary | ICD-10-CM

## 2023-07-19 DIAGNOSIS — I5032 Chronic diastolic (congestive) heart failure: Secondary | ICD-10-CM

## 2023-07-19 MED ORDER — AMIODARONE HCL 200 MG PO TABS: 100.0000 mg | ORAL_TABLET | Freq: Every day | ORAL | 1 refills | Status: DC

## 2023-07-19 MED ORDER — METOPROLOL SUCCINATE ER 50 MG PO TB24: 50.0000 mg | ORAL_TABLET | Freq: Every day | ORAL | 1 refills | Status: DC

## 2023-07-19 MED ORDER — SIMVASTATIN 40 MG PO TABS: 40.0000 mg | ORAL_TABLET | Freq: Every day | ORAL | 1 refills | Status: DC

## 2023-07-19 MED ORDER — ISOSORBIDE MONONITRATE ER 30 MG PO TB24: 30.0000 mg | ORAL_TABLET | Freq: Every day | ORAL | 1 refills | Status: DC

## 2023-07-19 MED ORDER — HYDRALAZINE HCL 25 MG PO TABS: 12.5000 mg | ORAL_TABLET | Freq: Three times a day (TID) | ORAL | 1 refills | Status: AC | PRN

## 2023-07-19 MED ORDER — FUROSEMIDE 40 MG PO TABS: ORAL_TABLET | ORAL | 1 refills | Status: DC

## 2023-07-19 NOTE — Patient Instructions (Addendum)
Medication Instructions:  The current medical regimen is effective;  continue present plan and medications as directed. Please refer to the Current Medication list given to you today.  *If you need a refill on your cardiac medications before your next appointment, please call your pharmacy*  Lab Work: CBC TODAY If you have labs (blood work) drawn today and your tests are completely normal, you will receive your results only by:  MyChart Message (if you have MyChart) OR  A paper copy in the mail If you have any lab test that is abnormal or we need to change your treatment, we will call you to review the results.  Testing/Procedures: REFER TO PULMONARY FOR EXERTIONAL SHORTNESS OF BREATH *REFER TO NUTRITION AND DIABETES *REFER TO PHYSICAL THERAPY  Follow-Up: At [facility], you and your health needs are our priority.  As part of our continuing mission to provide you with exceptional heart care, we have created designated Provider Care Teams.  These Care Teams include your primary Cardiologist (physician) and Advanced Practice Providers (APPs -  Physician Assistants and Nurse Practitioners) who all work together to provide you with the care you need, when you need it.  Your next appointment:   6 month(s)  Provider:   Nanetta Batty, MD  or Edd Fabian, FNP

## 2023-07-19 NOTE — Addendum Note (Signed)
 Addended by: Dorthey Gave on: 07/19/2023 01:59 PM   Modules accepted: Orders

## 2023-07-19 NOTE — Telephone Encounter (Signed)
-----   Message from Marit VEAR Lark sent at 07/19/2023  2:43 PM EST ----- Regarding: RE: Another Social Work Referral Hey Rosaline and Josefa Blossom dutifully has been trying to send these referrals our way when they pop up since the REF98 doesn't actually land on any social work queue that we have access to. Our correct referral order is [id number] AMB REF HRT/VAS CARE NAV. Sometimes you have to hit broaden my search for it to pop up. That way it actually shows up somewhere we can see and saves Blossom from managing it :) Damien and Burnard have sent out the tips and tricks sheet for the order set and I can re-send it if needed!   I see from the comments that this may actually be for health coaching so I cc'd Amy on here as well.  Hopefully we can get the pt squared away with what she needs.   Thanks all!  Marit Lark, MSW, LCSW Clinical Social Worker II [facility] Navigation  [phone]- work cell phone (preferred) [phone]- desk phone ----- Message ----- From: Devonna Blossom Sent: 07/19/2023   2:25 PM EST To: Marit VEAR Lark, LCSW Subject: Another Social Work Referral                   Would you look at this please and let me know what I need to do?  Rosaline Finical put this one in....SABRAthanks!   07-19-23 VB

## 2023-07-19 NOTE — Telephone Encounter (Signed)
 Discussed referral with Claven Cumming, nothing further needed she took care of this with Verlena Glenn

## 2023-07-20 LAB — CBC
Hematocrit: 40.1 % (ref 34.0–46.6)
Hemoglobin: 12.9 g/dL (ref 11.1–15.9)
MCH: 29.9 pg (ref 26.6–33.0)
MCHC: 32.2 g/dL (ref 31.5–35.7)
MCV: 93 fL (ref 79–97)
Platelets: 229 10*3/uL (ref 150–450)
RBC: 4.31 x10E6/uL (ref 3.77–5.28)
RDW: 12.8 % (ref 11.7–15.4)
WBC: 4.4 10*3/uL (ref 3.4–10.8)

## 2023-07-25 ENCOUNTER — Telehealth (HOSPITAL_BASED_OUTPATIENT_CLINIC_OR_DEPARTMENT_OTHER): Payer: Self-pay

## 2023-07-25 ENCOUNTER — Telehealth: Payer: Self-pay | Admitting: General Practice

## 2023-07-25 DIAGNOSIS — Z0000 Encounter for general adult medical examination without abnormal findings: Secondary | ICD-10-CM

## 2023-07-25 NOTE — Telephone Encounter (Signed)
I called the patient and left a message for them to reach out to discuss health coaching for their health goals of interest.

## 2023-07-25 NOTE — Telephone Encounter (Signed)
Pt asked if the provider had any other suggestions for Pulmonary. She states [facility] is booked out until April. She states she would be willing to go outside of [facility]. Please advise.

## 2023-07-25 NOTE — Telephone Encounter (Signed)
Called patient to discuss health coaching referral for healthy eating and engaging in resistance training per referral from [facility]. Patient did not answer. Left message for patient to return call.   Renaee Munda, MS, ERHD, [facility]  Care Guide, Health & Wellness Coach [address] [place] [address] Telephone: [phone] Email: [email or web address]

## 2023-07-26 ENCOUNTER — Other Ambulatory Visit: Payer: Self-pay

## 2023-07-26 NOTE — Telephone Encounter (Signed)
Per chart review patient read mychart message  No further nursing outreach needed at this time

## 2023-07-26 NOTE — Telephone Encounter (Signed)
Called and made sure that patient had gotten message from Sherlean Foot NP regarding pulmonary referral. She voiced that she had.

## 2023-08-02 ENCOUNTER — Telehealth: Payer: Self-pay

## 2023-08-02 DIAGNOSIS — Z0000 Encounter for general adult medical examination without abnormal findings: Secondary | ICD-10-CM

## 2023-08-02 NOTE — Telephone Encounter (Signed)
 C-

## 2023-08-02 NOTE — Telephone Encounter (Signed)
 Patient has concerns about gaining weight although eating a balanced meal twice per day, light lunch, healthy snacks such as fruits instead of cake and ice cream. Reports having Alpha Gal. Patient was exercising at the [facility] 3xs/wk until she started having issues with breathing. Patient is now exercising at home and continues to having difficulty breathing. Patient reported that she has had a decrease in urine throughout the day and is not sure if she is retaining fluid that may be causing the weight gain. Patient stated that she is also taking medication that requires the monitoring of her kidney functioning. Need to speak with Verdon Cummins before agreeing to health coaching this patient and discuss my recommendations.   Renaee Munda, MS, ERHD, [facility]  Care Guide, Health & Wellness Coach [address], [address] [place] [address] Telephone: [phone] Email: [email or web address]

## 2023-08-03 ENCOUNTER — Telehealth: Payer: Self-pay

## 2023-08-03 DIAGNOSIS — Z0000 Encounter for general adult medical examination without abnormal findings: Secondary | ICD-10-CM

## 2023-08-03 DIAGNOSIS — R0609 Other forms of dyspnea: Secondary | ICD-10-CM

## 2023-08-03 DIAGNOSIS — I5032 Chronic diastolic (congestive) heart failure: Secondary | ICD-10-CM

## 2023-08-03 NOTE — Telephone Encounter (Signed)
 Called patient to inform her that I spoke with Verdon Cummins regarding our conversation yesterday and that Verdon Cummins would would be referring her to a nutritionist and physical therapy. Patient stated that the nutrition services called and stated that her insurance would not cover her appointments so her appointment was cancelled. I confirmed that this information is correct in the referral # [id number]. Will inform Edd Fabian of this finding. Patient is still concerned about not having adequate urine output during the day and possible fluid retention. Will inform Verdon Cummins and Fabiola Backer to follow up with patient for more clarity on issue.   Renaee Munda, MS, ERHD, [facility]  Care Guide, Health & Wellness Coach [address] [place] [address] Telephone: [phone] Email: [email or web address]

## 2023-08-03 NOTE — Telephone Encounter (Signed)
-----   Message from Renaee Munda sent at 08/03/2023  2:25 PM EST ----- Hi Verdon Cummins and Dollene Primrose recommended to do a referral for general nutrition under REF 50 since REF 20 didn't work out with her insurance. I routed a note to you earlier. Rutherford Nail mentioned that if she is not hydrating that could cause an issue with the urination concern that the patient is presenting. Not sure if you want to follow up with that to rule it out.   Renaee Munda, MS, ERHD, [facility] Care Guide, Health & Wellness Coach [address] [place] [address] Telephone: [phone] Email: [email or web address]

## 2023-08-03 NOTE — Addendum Note (Signed)
 Addended by: Alyson Ingles on: 08/03/2023 08:31 AM   Modules accepted: Orders

## 2023-08-03 NOTE — Telephone Encounter (Signed)
 Ronney Asters, NP  Alyson Ingles, LPN On last check her GFR and creatinine were improving.  Would not recommend increasing diuresis at this time.  I would proceed with physical therapy and dietary planning.  Would recommend that she follow-up with her PCP for reduced urinary output. Thank you, Thomasene Ripple. Cleaver NP-C  Spoke with Rutherford Nail social worker she states to enter initial MTN(medical nutrition therapy) for 3 hours  Pt informed of providers result & recommendations. Pt verbalized understanding. All questions, if any, were answered. Pt has pulmonary appt is scheduled for 09-18-23 at 1030am Dr Francine Graven.  Discussed patient with Edd Fabian, FNP-C  states that referral for physical therapy has already been entered.

## 2023-08-07 ENCOUNTER — Ambulatory Visit: Payer: Medicare HMO

## 2023-08-07 ENCOUNTER — Ambulatory Visit
Admission: RE | Admit: 2023-08-07 | Discharge: 2023-08-07 | Disposition: A | Discharge status: Home / Self Care | Payer: Medicare HMO | Source: Ambulatory Visit | Attending: Family Medicine | Admitting: Family Medicine

## 2023-08-07 DIAGNOSIS — I350 Nonrheumatic aortic (valve) stenosis: Secondary | ICD-10-CM | POA: Diagnosis not present

## 2023-08-07 DIAGNOSIS — Z1231 Encounter for screening mammogram for malignant neoplasm of breast: Secondary | ICD-10-CM

## 2023-08-07 DIAGNOSIS — R946 Abnormal results of thyroid function studies: Secondary | ICD-10-CM | POA: Diagnosis not present

## 2023-08-07 DIAGNOSIS — E785 Hyperlipidemia, unspecified: Secondary | ICD-10-CM | POA: Diagnosis not present

## 2023-08-07 DIAGNOSIS — E1159 Type 2 diabetes mellitus with other circulatory complications: Secondary | ICD-10-CM | POA: Diagnosis not present

## 2023-08-07 LAB — LAB REPORT - SCANNED
A1c: 6.6
EGFR (African American): 52
TSH: 1.68 (ref 0.41–5.90)

## 2023-08-14 DIAGNOSIS — E1159 Type 2 diabetes mellitus with other circulatory complications: Secondary | ICD-10-CM | POA: Diagnosis not present

## 2023-08-14 DIAGNOSIS — Z0000 Encounter for general adult medical examination without abnormal findings: Secondary | ICD-10-CM | POA: Diagnosis not present

## 2023-08-14 DIAGNOSIS — J45909 Unspecified asthma, uncomplicated: Secondary | ICD-10-CM | POA: Diagnosis not present

## 2023-08-14 DIAGNOSIS — I2510 Atherosclerotic heart disease of native coronary artery without angina pectoris: Secondary | ICD-10-CM | POA: Diagnosis not present

## 2023-08-14 DIAGNOSIS — I493 Ventricular premature depolarization: Secondary | ICD-10-CM | POA: Diagnosis not present

## 2023-08-14 DIAGNOSIS — E785 Hyperlipidemia, unspecified: Secondary | ICD-10-CM | POA: Diagnosis not present

## 2023-08-14 DIAGNOSIS — M47816 Spondylosis without myelopathy or radiculopathy, lumbar region: Secondary | ICD-10-CM | POA: Diagnosis not present

## 2023-08-14 DIAGNOSIS — J329 Chronic sinusitis, unspecified: Secondary | ICD-10-CM | POA: Diagnosis not present

## 2023-08-14 DIAGNOSIS — R0602 Shortness of breath: Secondary | ICD-10-CM | POA: Diagnosis not present

## 2023-08-16 NOTE — Therapy (Signed)
 OUTPATIENT PHYSICAL THERAPY THORACOLUMBAR EVALUATION  Patient Name: Megan Hull MRN: [id number] DOB:July 25, 1940, 83 y.o., female Today's Date: 08/21/2023   PCP: Irena Reichmann, DO  REFERRING PROVIDER: Ronney Asters, NP   END OF SESSION:  PT End of Session - 08/21/23 1307     Visit Number 1    Number of Visits 16    Date for PT Re-Evaluation 10/16/23    Authorization Type [employer] MCR    PT Start Time 1310    PT Stop Time 1355    PT Time Calculation (min) 45 min    Activity Tolerance Other (comment)   Endurance and SOB   Behavior During Therapy WFL for tasks assessed/performed             Past Medical History:  Diagnosis Date   Arthritis    CHF (congestive heart failure) (HCC)    Diabetes mellitus without complication (HCC)    borderline   Diarrhea    Diverticulosis    Dysrhythmia    controlled with Metoprolol-12-19-13 LOV -Dr. Allyson Sabal sees yearly   Family history of premature CAD    GERD (gastroesophageal reflux disease)    Heart murmur    yrs ago   HLD (hyperlipidemia)    pt reports cholesterol normal now (07/21/19).   Hypertension    Lower extremity edema    Status post dilation of esophageal narrowing    Supraventricular arrhythmia    Tobacco abuse    Past Surgical History:  Procedure Laterality Date   ANTERIOR CERVICAL DECOMP/DISCECTOMY FUSION N/A 06/08/2015   Procedure: CERVICAL FIVE-SIX ,CERVICAL SIX-SEVEN ANTERIOR CERVICAL DECOMPRESSION/DISCECTOMY FUSION PLATING BONEGRAFT;  Surgeon: Barnett Abu, MD;  Location: [facility];  Service: Neurosurgery;  Laterality: N/A;   CARDIAC CATHETERIZATION  01/15/2009   non-critical CAD, 60% mid LAD lesion (Dr. Erlene Quan)   CATARACT EXTRACTION, BILATERAL     most recent 11/19   COLONOSCOPY     ESOPHAGEAL DILATION  2014   infected lymph node surgery  80's   NM MYOCAR PERF WALL MOTION  04/2011   bruce myoview; normal pattern of perfusion, EF 80%, low risk scan   SINUS ENDO WITH FUSION Bilateral 03/04/2020    Procedure: SINUS ENDOSCOPY WITH FUSION NAVIGATION;  Surgeon: Osborn Coho, MD;  Location: [facility];  Service: ENT;  Laterality: Bilateral;   TONSILLECTOMY  70's   TRANSTHORACIC ECHOCARDIOGRAM  12/2007   mild conc LVH   TURBINATE REDUCTION Bilateral 03/04/2020   Procedure: TURBINATE REDUCTION;  Surgeon: Osborn Coho, MD;  Location: [facility];  Service: ENT;  Laterality: Bilateral;   Patient Active Problem List   Diagnosis Date Noted   Change in bowel habit 05/21/2023   Herniation of rectum into vagina 05/21/2023   Cataract 01/17/2022   Chronic low back pain 01/17/2022   Hematuria 01/17/2022   Osteopenia 01/17/2022   Sciatica of left side 01/17/2022   Vitamin D deficiency 01/17/2022   Aortic stenosis 11/16/2021   Dysrhythmia 09/01/2021   Acute kidney injury superimposed on chronic kidney disease (HCC) 09/01/2021   Controlled type 2 diabetes mellitus without complication, without long-term current use of insulin (HCC) 09/01/2021   Hypokalemia 09/01/2021   Hypocalcemia 09/01/2021   Palpitations 06/15/2021   Seasonal allergic rhinitis 01/11/2021   Sinusitis, chronic 03/04/2020   Nasal polyps 11/21/2019   Cheek mass 07/21/2019   SVT (supraventricular tachycardia) (HCC) 05/21/2018   Hoarseness of voice 02/07/2018   Chronic pansinusitis 02/07/2018   Acute sinusitis 01/24/2018   Chronic diastolic (congestive)  heart failure (HCC) 05/31/2017   Left leg weakness 02/10/2016   DJD (degenerative joint disease) 01/26/2016   History of angioedema 01/21/2016   Angioedema 01/21/2016   Cervical spondylosis with myelopathy and radiculopathy 06/08/2015   Stenosis of cervix 05/31/2015   Cervical radiculopathy 01/19/2015   Coronary artery disease 12/18/2014   Esophageal stricture 04/23/2014   Lower extremity numbness 08/11/2013   Essential hypertension 05/15/2013   Dyslipidemia 05/15/2013   Dyspnea 05/15/2013   Lower extremity edema 05/15/2013    Hyperlipidemia, unspecified 05/15/2013   Esophageal reflux 01/20/2010   Dysphagia 01/20/2010    ONSET DATE: 2017  REFERRING DIAG: R53.1 (ICD-10-CM) - Generalized weakness   THERAPY DIAG:  Muscle weakness (generalized) - Plan: PT plan of care cert/re-cert  Left-sided low back pain with left-sided sciatica, unspecified chronicity - Plan: PT plan of care cert/re-cert  Activity intolerance - Plan: PT plan of care cert/re-cert   Rationale for Evaluation and Treatment: Rehabilitation  SUBJECTIVE:                                                                                                                                                                                             SUBJECTIVE STATEMENT: I am here for 2 reasons.  I get out of breath easily on exertion.  I even get out of breath when I talk and I cannot walk like I usually walk.  I go on my roof and blow my leaves off but I dont feel strong.  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 I would like to gain endurance and I have  pain on my left side.  I have had for 6 months because I was left on my left side too long in the hospital for 7 days because of the anaphylactic shock in 2017.    Every tues and Thursday I go to the [facility] for stretch class.  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 Yes: NPRS scale: 0/10 at worst 5-6/10 Pain location: lateral left leg ( sciatica) Pain description: achy pain Aggravating factors: excessive standing and walking at end of active stressful day. Relieving factors: nothing as of now , need to improve endurance  PRECAUTIONS:  High: Nexium [Esomeprazole Magnesium]  Medium: Ace Inhibitors; Codeine; Monosodium Glutamate; Other  Low:  Amlodipine; Latex; Shellfish Allergy; Tape    RED FLAGS: None   WEIGHT BEARING RESTRICTIONS: No  FALLS: Has patient fallen in last 6 months? No  LIVING ENVIRONMENT: Lives with: lives alone Lives in: House/apartment Stairs: Yes: Internal: basement 16  steps; on right going up and External: 0 steps; none Has following equipment at home: None  PLOF: Independent  PATIENT GOALS: Build endurance and to increase strength in LE  OBJECTIVE:  Note: Objective measures were completed at Evaluation unless otherwise noted.  DIAGNOSTIC FINDINGS: see medical records and imaging  COGNITION: Overall cognitive status: Within functional limits for tasks assessed   SENSATION: WFL  MUSCLE LENGTH: Hamstrings: Right 60 deg; Left 55 deg    POSTURE: rounded shoulders, forward head, and anterior pelvic tilt  UPPER EXTREMITY MMT:  grossly 4-4+/5  MMT Right eval Left eval  Shoulder flexion    Shoulder extension    Shoulder abduction    Shoulder adduction    Shoulder extension    Shoulder internal rotation    Shoulder external rotation    Middle trapezius    Lower trapezius    Elbow flexion    Elbow extension    Wrist flexion    Wrist extension    Wrist ulnar deviation    Wrist radial deviation    Wrist pronation    Wrist supination    Grip strength     (Blank rows = not tested)    LOWER EXTREMITY MMT:    MMT Right Eval Left Eval  Hip flexion 4+ 4-  Hip extension 4+ 4-  Hip abduction 4 4-  Hip adduction    Hip internal rotation    Hip external rotation    Knee flexion 4+ 4-  Knee extension 4 4-  Ankle dorsiflexion    Ankle plantarflexion    Ankle inversion 7/25 3/25  Ankle eversion    (Blank rows = not tested) (Key: WFL = within functional limits not formally assessed, * = concordant pain, s = stiffness/stretching sensation, NT = not tested LUMBAR ROM:   AROM eval  Flexion Fingertips to lower 1/3 of legs  Extension 20%  Right lateral flexion WFL  Left  lateral flexion WFL  Right rotation WFL  Left rotation WFL   (Blank rows = not tested) (Key: WFL = within functional limits not formally assessed, * = concordant pain, s = stiffness/stretching sensation, NT = not tested GAIT:  Distance walked: 5 times sit to stand: 16.0 sec 6 minute walk test: 1131.3 ft SOB at 2.5 min and 96 pulse ox and 86 PR Assistive device utilized: None Level of assistance: Complete Independence Comments: SOB after 2.5 min of walking  FUNCTIONAL TESTS:   5 times sit to stand: 16.0 sec 6 minute walk test: 1131.3 ft SOB at 2.5 min and 96 pulse ox and 86 PR PATIENT SURVEYS:  The pt specific functional scale         Activity Task Be able to walk for one mile  2/10 Be able to clean my house and complete tasks for at least 30 min 3/10 Carry item such as a 10 lb weight without becoming SOB in 6 MWT  0/10 RAW score 5/30  TREATMENT DATE: eval 08-21-23  Eval    PATIENT EDUCATION: Education details: POC Explanation of findings, Aquatics, issue HEP Person educated: Patient Education method: Explanation, Demonstration, Tactile cues, Verbal cues, and Handouts Education comprehension: verbalized understanding, returned demonstration, verbal cues required, tactile cues required, and needs further education   HOME EXERCISE PROGRAM: Access Code: [email or web address] URL: [email or web address] Date: 08/21/2023 Prepared by: Garen Lah  Program Notes also do IT band stretch like hamstring stretch but bring foot over opposite shoulder.  Exercises - Sit to stand with sink support Movement snack  - 1 x daily - 7 x weekly - 3 sets - 10 reps - Supine Hamstring Stretch with Strap  - 1 x daily - 7 x weekly - 3 sets - 10 reps  GOALS: Goals reviewed with patient? Yes  SHORT TERM GOALS: Target date: 09-20-23  Pt will be independent with initial  HEP Baseline: Goal status: INITIAL  2.  Pt will be educated on RPE scale and how to use to monitor herself Baseline: no knowledge Goal status: INITIAL  3.  Pt will be able to walk for 10 min on tread mill with RPE no greater than 7-8/10 Baseline:  Pt not tested on treadmill but had SOB after 2.5 min on treadmill Goal status: INITIAL   LONG TERM GOALS: Target date: 10-16-23  Pt will be I with advanced HEP Baseline: no knowledge Goal status: INITIAL  2.  Pt will get up and down from the ground with proper form in order to show increased LE strength and control Baseline: Pt able to get up from ground to pray but heavy use of UE Goal status: INITIAL  3.  Pt will be able to complete household chores within 30 min without stopping for SOB Baseline: eval pt stops after 2.5 min of walking in 6 MWT Goal status: INITIAL  4.  Pt will be able to walk a mile with RPE no more than 5-6/10 RPE Baseline: SOB after 2.5 min walking on flat surface Goal status: INITIAL  5.  Pt functional specific scale with improve to at least 15/30 Baseline: 5/30 eval Goal status: INITIAL                                                                                                                      ASSESSMENT:  CLINICAL IMPRESSION: Patient is a 83 y.o. female who was seen today for physical therapy evaluation and treatment for generalized weakness and deconditioning with SOB and lack of endurance with daily tasks.  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  Pt will benefit from skilled PT to address SOB, endurance, generalized weakness and occasional pain on left lower lateral leg.  .   OBJECTIVE IMPAIRMENTS: cardiopulmonary status limiting activity, decreased activity tolerance, decreased endurance, decreased knowledge of condition, decreased mobility, decreased ROM, decreased  strength, pain, and SOB .   ACTIVITY LIMITATIONS:  carrying, lifting, standing, squatting, stairs, and locomotion level  PARTICIPATION LIMITATIONS: meal prep, cleaning, laundry, community activity, yard work, and church  PERSONAL FACTORS:  See pertinent history SOB/endurance  are also affecting patient's functional outcome.   REHAB POTENTIAL: Good  CLINICAL DECISION MAKING: Evolving/moderate complexity  EVALUATION COMPLEXITY: Moderate  PLAN:  PT FREQUENCY: 1-2x/week  PT DURATION: 8 weeks  PLANNED INTERVENTIONS: 97164- PT Re-evaluation, 97110-Therapeutic exercises, 97530- Therapeutic activity, 97112- Neuromuscular re-education, 97535- Self Care, 45409- Manual therapy, [phone]- Gait training, [phone]- Aquatic Therapy, Patient/Family education, Balance training, Stair training, Taping, Dry Needling, Joint mobilization, Spinal mobilization, Vestibular training, Cryotherapy, and Moist heat  PLAN FOR NEXT SESSION: Treadmill test for 15 min and monitor SOB,  basic back exercise   Referring diagnosis? Generalized weakness Treatment diagnosis? (if different than referring diagnosis) left sciatica as well What was this (referring dx) caused by? []  Surgery []  Fall []  Ongoing issue [x]  Arthritis [x]  Other: __deconditioning__________  Laterality: []  Rt [x]  Lt [x]  Both  Check all possible CPT codes:  *CHOOSE 10 OR LESS*    See Planned Interventions listed in the Plan section of the Evaluation.     Garen Lah, PT, ATRIC Certified Exercise Expert for the Aging Adult  08/21/23 5:39 PM Phone: [phone] Fax: [phone]

## 2023-08-21 ENCOUNTER — Other Ambulatory Visit: Payer: Self-pay

## 2023-08-21 ENCOUNTER — Ambulatory Visit: Payer: Medicare HMO | Attending: General Practice | Admitting: Physical Therapy

## 2023-08-21 ENCOUNTER — Encounter: Payer: Self-pay | Admitting: Physical Therapy

## 2023-08-21 DIAGNOSIS — R531 Weakness: Secondary | ICD-10-CM | POA: Insufficient documentation

## 2023-08-21 DIAGNOSIS — M6281 Muscle weakness (generalized): Secondary | ICD-10-CM | POA: Diagnosis not present

## 2023-08-21 DIAGNOSIS — R6889 Other general symptoms and signs: Secondary | ICD-10-CM | POA: Insufficient documentation

## 2023-08-21 DIAGNOSIS — M5442 Lumbago with sciatica, left side: Secondary | ICD-10-CM | POA: Diagnosis not present

## 2023-08-21 NOTE — Patient Instructions (Addendum)
 WALKING  Walking is a great form of exercise to increase your strength, endurance and overall fitness.  A walking program can help you start slowly and gradually build endurance as you go.  Everyone's ability is different, so each person's starting point will be different.  You do not have to follow them exactly.  The are just samples. You should simply find out what's right for you and stick to that program.   In the beginning, you'll start off walking 2-3 times a day for short distances.  As you get stronger, you'll be walking further at just 1-2 times per day.  A. You Can Walk For A Certain Length Of Time Each Day    Walk 5 minutes 3 times per day.  Increase 2 minutes every 2 days (3 times per day).  Work up to 25-30 minutes  without stopping (1-2 times per day).   Example:   Day 1-2 5 minutes 3 times per day   Day 7-8 12 minutes 2-3 times per day   Day 13-14 25 minutes 1-2 times per day  B. You Can Walk For a Certain Distance Each Day     Distance can be substituted for time.    Example:   3 trips to mailbox (at road)   3 trips to corner of block   3 trips around the block  C. Go to local high school and use the track.    Walk for distance ____ around track  Or time ____ minutes  D. Walk __x__ Jog ____ Run ___  Please only do the exercises that your therapist has initialed and dated  Aquatic Therapy at [facility]-  What to Expect!  Where:   [facility] [address] [place], [place] [address] Rehab phone [phone]  NOTE:  You will receive an automated phone message reminding you of your appt and it will say the appointment is at the [facility] clinic.          How to Prepare: Please make sure you drink 8 ounces of water about one hour prior to your pool session A caregiver may attend if needed with the patient to help assist as needed. A caregiver can sit in the pool room on chair. Please arrive  IN YOUR SUIT and 15 minutes prior to your appointment - this helps to avoid delays in starting your session. Please make sure to attend to any toileting needs prior to entering the pool [place] rooms for changing are provided.   There is direct access to the pool deck form the locker room.  You can lock your belongings in a locker with lock provided. Once on the pool deck your therapist will ask if you have signed the Patient  Consent and Assignment of Benefits form before beginning treatment Your therapist may take your blood pressure prior to, during and after your session if indicated We usually try and create a home exercise program based on activities we do in the pool.  Please be thinking about who might be able to assist you in the pool should you need to participate in an aquatic home exercise program at the time of discharge if you need assistance.  Some patients do not want to or do not have the ability to participate in an aquatic home program - this is not a barrier in any way to you participating in aquatic therapy as part of your current therapy plan! After Discharge from PT, you can  continue using home program at  the [facility], there is a drop-in fee for $5 ($45 a month)or for 60 years  or older $4.00 ($40 a month for seniors ) or any local [employer] pool.  Memberships for purchase are available for gym/pool at [facility]  IT IS VERY IMPORTANT THAT YOUR LAST VISIT BE IN THE CLINIC AT [facility] [address] AFTER YOUR LAST AQUATIC VISIT.  PLEASE MAKE SURE THAT YOU HAVE A [address]  APPOINTMENT SCHEDULED.   About the pool: Pool is located approximately 500 FT from the entrance of the building.  Please bring a support person if you need assistance traveling this      distance.   Your therapist will assist you in entering the water; there are two ways to           enter: stairs with railings, and a mechanical lift. Your therapist will determine the most appropriate way for  you.  Water temperature is usually between 88-90 degrees  There may be up to 2 other swimmers in the pool at the same time  The pool deck is tile, please wear shoes with good traction if you prefer not to be barefoot.    Contact Info:  For appointment scheduling and cancellations:         Please call the [facility]  PH:[phone]              Aquatic Therapy  Outpatient Rehabilitation @ [facility]       All sessions are 45 minutes                                                    Garen Lah, PT, [facility] Certified Exercise Expert for the Aging Adult  08/21/23 1:51 PM Phone: [phone] Fax: [phone]

## 2023-08-22 NOTE — Progress Notes (Unsigned)
  Electrophysiology Office Note:   Date:  08/23/2023  ID:  Megan Hull, DOB 08-16-1940, MRN [id number]  Primary Cardiologist: Nanetta Batty, MD Electrophysiologist: Regan Lemming, MD      History of Present Illness:   Megan Hull is a 83 y.o. female with h/o DM2, HTN, HLD, CAD, and PVCs seen today for routine electrophysiology followup.   Since last being seen in our clinic the patient reports doing about the same. She remains SOB with exertion, but reports she is sedentary. The most active thing she does is walk from her car to a building. She rarely does a stretch class but otherwise no specific exercise. Normal spirometry at allergist in December, but no DLCO measured. Seeing pulmonary 4/8. Otherwise,  she denies chest pain, palpitations, PND, orthopnea, nausea, vomiting, dizziness, syncope, edema, weight gain, or early satiety.      She worries that amiodarone could be a cause, but felt horrible when she was having PVCs, so doesn't want to stop unless she absolutely needs to.   Review of systems complete and found to be negative unless listed in HPI.   EP Information / Studies Reviewed:    EKG is ordered today. Personal review as below.  EKG Interpretation Date/Time:  Thursday August 23 2023 07:55:32 EDT Ventricular Rate:  62 PR Interval:  246 QRS Duration:  84 QT Interval:  456 QTC Calculation: 462 R Axis:   -12  Text Interpretation: Sinus rhythm with 1st degree A-V block T wave abnormality, consider anterior ischemia When compared with ECG of 14-Mar-2023 09:45, Nonspecific T wave abnormality now evident in Inferior leads T wave inversion now evident in Anterior leads Confirmed by Maxine Glenn [phone]) on 08/23/2023 8:05:38 AM    Arrhythmia/Device History No specialty comments available.   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 To be scanned.   CAD No s/s ischemia Continue ASA Continue statin   HTN Stable on current regimen   DOE Chronic Relatively normal spirometry 05/2023 but no DLCO.  Seeing pulmonary 4/8. Encouraged increased activity as tolerated. She is minimally active currently.   Follow up with Dr. Elberta Fortis in 6 months  Signed, Graciella Freer, PA-C

## 2023-08-23 ENCOUNTER — Encounter: Payer: Self-pay | Admitting: Student

## 2023-08-23 ENCOUNTER — Ambulatory Visit: Payer: Medicare HMO | Attending: Student | Admitting: Student

## 2023-08-23 VITALS — BP 122/68 | HR 62 | Ht 65.0 in | Wt 183.4 lb

## 2023-08-23 DIAGNOSIS — I10 Essential (primary) hypertension: Secondary | ICD-10-CM | POA: Diagnosis not present

## 2023-08-23 DIAGNOSIS — I493 Ventricular premature depolarization: Secondary | ICD-10-CM | POA: Diagnosis not present

## 2023-08-23 DIAGNOSIS — I2510 Atherosclerotic heart disease of native coronary artery without angina pectoris: Secondary | ICD-10-CM | POA: Diagnosis not present

## 2023-08-23 NOTE — Patient Instructions (Signed)
Medication Instructions:  Your physician recommends that you continue on your current medications as directed. Please refer to the Current Medication list given to you today.  *If you need a refill on your cardiac medications before your next appointment, please call your pharmacy*  Lab Work: None ordered If you have labs (blood work) drawn today and your tests are completely normal, you will receive your results only by: MyChart Message (if you have MyChart) OR A paper copy in the mail If you have any lab test that is abnormal or we need to change your treatment, we will call you to review the results.  Follow-Up: At [facility], you and your health needs are our priority.  As part of our continuing mission to provide you with exceptional heart care, we have created designated Provider Care Teams.  These Care Teams include your primary Cardiologist (physician) and Advanced Practice Providers (APPs -  Physician Assistants and Nurse Practitioners) who all work together to provide you with the care you need, when you need it.  Your next appointment:   6 month(s)  Provider:   Loman Brooklyn, MD or [facility]" [place], [place]

## 2023-08-29 ENCOUNTER — Encounter: Payer: Self-pay | Admitting: Physical Therapy

## 2023-08-29 ENCOUNTER — Ambulatory Visit: Admitting: Physical Therapy

## 2023-08-29 DIAGNOSIS — M6281 Muscle weakness (generalized): Secondary | ICD-10-CM

## 2023-08-29 DIAGNOSIS — R6889 Other general symptoms and signs: Secondary | ICD-10-CM

## 2023-08-29 DIAGNOSIS — M5442 Lumbago with sciatica, left side: Secondary | ICD-10-CM

## 2023-08-29 DIAGNOSIS — R531 Weakness: Secondary | ICD-10-CM | POA: Diagnosis not present

## 2023-08-29 NOTE — Therapy (Addendum)
 OUTPATIENT PHYSICAL THERAPY THORACOLUMBAR TREATMENT DISCHARGE NOTE PHYSICAL THERAPY DISCHARGE SUMMARY  Visits from Start of Care: 2  Current functional level related to goals / functional outcomes: UNKNOWN   Remaining deficits: unknown   Education / Equipment: Initial HEP   Patient agrees to discharge. Patient goals were not met. Patient is being discharged due to not returning since the last visit.   Patient Name: Megan Hull MRN: [id number] DOB:08/29/1940, 83 y.o., female Today's Date: 08/29/2023   PCP: Pete Brand, DO  REFERRING PROVIDER: Carie Charity, NP   END OF SESSION:  PT End of Session - 08/29/23 1355     Visit Number 2    Number of Visits 16    Date for PT Re-Evaluation 10/16/23    Authorization Type [employer] MCR    Authorization Time Period 08/27/23-10/13/23    Authorization - Visit Number 1    Authorization - Number of Visits 12    PT Start Time 1400    PT Stop Time 1445    PT Time Calculation (min) 45 min             Past Medical History:  Diagnosis Date   Arthritis    CHF (congestive heart failure) (HCC)    Diabetes mellitus without complication (HCC)    borderline   Diarrhea    Diverticulosis    Dysrhythmia    controlled with Metoprolol -12-19-13 LOV -Dr. Katheryne Pane sees yearly   Family history of premature CAD    GERD (gastroesophageal reflux disease)    Heart murmur    yrs ago   HLD (hyperlipidemia)    pt reports cholesterol normal now (07/21/19).   Hypertension    Lower extremity edema    Status post dilation of esophageal narrowing    Supraventricular arrhythmia    Tobacco abuse    Past Surgical History:  Procedure Laterality Date   ANTERIOR CERVICAL DECOMP/DISCECTOMY FUSION N/A 06/08/2015   Procedure: CERVICAL FIVE-SIX ,CERVICAL SIX-SEVEN ANTERIOR CERVICAL DECOMPRESSION/DISCECTOMY FUSION PLATING BONEGRAFT;  Surgeon: Elna Haggis, MD;  Location: [facility];  Service: Neurosurgery;  Laterality: N/A;   CARDIAC CATHETERIZATION   01/15/2009   non-critical CAD, 60% mid LAD lesion (Dr. Aleda Ammon)   CATARACT EXTRACTION, BILATERAL     most recent 11/19   COLONOSCOPY     ESOPHAGEAL DILATION  2014   infected lymph node surgery  80's   NM MYOCAR PERF WALL MOTION  04/2011   bruce myoview ; normal pattern of perfusion, EF 80%, low risk scan   SINUS ENDO WITH FUSION Bilateral 03/04/2020   Procedure: SINUS ENDOSCOPY WITH FUSION NAVIGATION;  Surgeon: Ammon Bales, MD;  Location: [facility];  Service: ENT;  Laterality: Bilateral;   TONSILLECTOMY  70's   TRANSTHORACIC ECHOCARDIOGRAM  12/2007   mild conc LVH   TURBINATE REDUCTION Bilateral 03/04/2020   Procedure: TURBINATE REDUCTION;  Surgeon: Ammon Bales, MD;  Location: [facility];  Service: ENT;  Laterality: Bilateral;   Patient Active Problem List   Diagnosis Date Noted   Change in bowel habit 05/21/2023   Herniation of rectum into vagina 05/21/2023   Cataract 01/17/2022   Chronic low back pain 01/17/2022   Hematuria 01/17/2022   Osteopenia 01/17/2022   Sciatica of left side 01/17/2022   Vitamin D deficiency 01/17/2022   Aortic stenosis 11/16/2021   Dysrhythmia 09/01/2021   Acute kidney injury superimposed on chronic kidney disease (HCC) 09/01/2021   Controlled type 2 diabetes mellitus without complication, without long-term current use of insulin  (  HCC) 09/01/2021   Hypokalemia 09/01/2021   Hypocalcemia 09/01/2021   Palpitations 06/15/2021   Seasonal allergic rhinitis 01/11/2021   Sinusitis, chronic 03/04/2020   Nasal polyps 11/21/2019   Cheek mass 07/21/2019   SVT (supraventricular tachycardia) (HCC) 05/21/2018   Hoarseness of voice 02/07/2018   Chronic pansinusitis 02/07/2018   Acute sinusitis 01/24/2018   Chronic diastolic (congestive) heart failure (HCC) 05/31/2017   Left leg weakness 02/10/2016   DJD (degenerative joint disease) 01/26/2016   History of angioedema 01/21/2016   Angioedema 01/21/2016   Cervical spondylosis  with myelopathy and radiculopathy 06/08/2015   Stenosis of cervix 05/31/2015   Cervical radiculopathy 01/19/2015   Coronary artery disease 12/18/2014   Esophageal stricture 04/23/2014   Lower extremity numbness 08/11/2013   Essential hypertension 05/15/2013   Dyslipidemia 05/15/2013   Dyspnea 05/15/2013   Lower extremity edema 05/15/2013   Hyperlipidemia, unspecified 05/15/2013   Esophageal reflux 01/20/2010   Dysphagia 01/20/2010    ONSET DATE: 2017  REFERRING DIAG: R53.1 (ICD-10-CM) - Generalized weakness   THERAPY DIAG:  Muscle weakness (generalized)  Left-sided low back pain with left-sided sciatica, unspecified chronicity  Activity intolerance   Rationale for Evaluation and Treatment: Rehabilitation  SUBJECTIVE:                                                                                                                                                                                             SUBJECTIVE STATEMENT: The leg is constant dull, feels tender and increases at night.    EVAL: I am here for 2 reasons.  I get out of breath easily on exertion.  I even get out of breath when I talk and I cannot walk like I usually walk.  I go on my roof and blow my leaves off but I dont feel strong.  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 I would like to gain endurance and I have  pain on my left side.  I have had for 6 months because I was left on my left side too long in the hospital for 7 days because of the anaphylactic shock in 2017.    Every tues and Thursday I go to the [facility] for stretch class.  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 Yes: NPRS scale: 0/10 at worst 5-6/10 Pain location: lateral left leg (  sciatica) Pain description: achy pain  Aggravating factors: excessive standing and walking at end of active stressful day. Relieving factors: nothing as of now , need to improve endurance  PRECAUTIONS:  High: Nexium  [Esomeprazole  Magnesium ]  Medium: Ace Inhibitors; Codeine; Monosodium Glutamate; Other  Low: Amlodipine ; Latex; Shellfish Allergy; Tape    RED FLAGS: None   WEIGHT BEARING RESTRICTIONS: No  FALLS: Has patient fallen in last 6 months? No  LIVING ENVIRONMENT: Lives with: lives alone Lives in: House/apartment Stairs: Yes: Internal: basement 16  steps; on right going up and External: 0 steps; none Has following equipment at home: None  PLOF: Independent  PATIENT GOALS: Build endurance and to increase strength in LE  OBJECTIVE:  Note: Objective measures were completed at Evaluation unless otherwise noted.  DIAGNOSTIC FINDINGS: see medical records and imaging  COGNITION: Overall cognitive status: Within functional limits for tasks assessed   SENSATION: WFL  MUSCLE LENGTH: Hamstrings: Right 60 deg; Left 55 deg    POSTURE: rounded shoulders, forward head, and anterior pelvic tilt  UPPER EXTREMITY MMT:  grossly 4-4+/5  MMT Right eval Left eval  Shoulder flexion    Shoulder extension    Shoulder abduction    Shoulder adduction    Shoulder extension    Shoulder internal rotation    Shoulder external rotation    Middle trapezius    Lower trapezius    Elbow flexion    Elbow extension    Wrist flexion    Wrist extension    Wrist ulnar deviation    Wrist radial deviation    Wrist pronation    Wrist supination    Grip strength     (Blank rows = not tested)    LOWER EXTREMITY MMT:    MMT Right Eval Left Eval  Hip flexion 4+ 4-  Hip extension 4+ 4-  Hip abduction 4 4-  Hip adduction    Hip internal rotation    Hip external rotation    Knee flexion 4+ 4-  Knee extension 4 4-  Ankle dorsiflexion    Ankle plantarflexion    Ankle inversion  7/25 3/25  Ankle eversion    (Blank rows = not tested) (Key: WFL = within functional limits not formally assessed, * = concordant pain, s = stiffness/stretching sensation, NT = not tested LUMBAR ROM:   AROM eval  Flexion Fingertips to lower 1/3 of legs  Extension 20%  Right lateral flexion WFL  Left lateral flexion WFL  Right rotation WFL  Left rotation WFL   (Blank rows = not tested) (Key: WFL = within functional limits not formally assessed, * = concordant pain, s = stiffness/stretching sensation, NT = not tested GAIT:  Distance walked: 5 times sit to stand: 16.0 sec 6 minute walk test: 1131.3 ft SOB at 2.5 min and 96 pulse ox and 86 PR Assistive device utilized: None Level of assistance: Complete Independence Comments: SOB after 2.5 min of walking  FUNCTIONAL TESTS:   5 times sit to stand: 16.0 sec 6 minute walk test: 1131.3 ft SOB at 2.5 min and 96 pulse ox and 86 PR PATIENT SURVEYS:  The pt specific functional scale         Activity Task Be able to walk for one mile  2/10 Be able to clean my house and complete tasks for at least 30 min 3/10 Carry item such as a 10 lb weight without becoming SOB in 6 MWT  0/10 RAW score 5/30  [facility] Adult PT Treatment:                                                DATE: 08/29/23 Therapeutic Exercise: Gastroc stretch at counter 2 x 30 sec  Seated gastroc stretch with towel  Supine h/s /ITB with strap Right figure 4   Therapeutic Activity: T.M. activity tolerance: 1.5 mph (RPE 3/Moderate @ 4 min)  (RPE 3.5 @ 6 minutes) ( RPE 3.5 @ 8 minutes) HR 75, Spo2 94% : 9 minutes total; discontinued due to leg pain.    TREATMENT DATE: eval 08-21-23  Eval    PATIENT EDUCATION: Education details: POC Explanation of findings, Aquatics, issue HEP Person educated: Patient Education method: Explanation, Demonstration, Tactile cues,  Verbal cues, and Handouts Education comprehension: verbalized understanding, returned demonstration, verbal cues required, tactile cues required, and needs further education   HOME EXERCISE PROGRAM: Access Code: [email or web address] URL: [email or web address] Date: 08/21/2023 Prepared by: Sharlet Dawson  Program Notes also do IT band stretch like hamstring stretch but bring foot over opposite shoulder.  Exercises - Sit to stand with sink support Movement snack  - 1 x daily - 7 x weekly - 3 sets - 10 reps - Supine Hamstring Stretch with Strap  - 1 x daily - 7 x weekly - 3 sets - 10 reps Added Figure 4 stretch Seated calf stretch with towel   GOALS: Goals reviewed with patient? Yes  SHORT TERM GOALS: Target date: 09-20-23  Pt will be independent with initial HEP Baseline: Goal status: INITIAL  2.  Pt will be educated on RPE scale and how to use to monitor herself Baseline: no knowledge 08/29/23: used modified Borg during T.M.  Goal status: INITIAL  3.  Pt will be able to walk for 10 min on tread mill with RPE no greater than 7-8/10 Baseline:  Pt not tested on treadmill but had SOB after 2.5 min on Goal status: INITIAL   LONG TERM GOALS: Target date: 10-16-23  Pt will be I with advanced HEP Baseline: no knowledge Goal status: INITIAL  2.  Pt will get up and down from the ground with proper form in order to show increased LE strength and control Baseline: Pt able to get up from ground to pray but heavy use of UE Goal status: INITIAL  3.  Pt will be able to complete household chores within 30 min without stopping for SOB Baseline: eval pt stops after 2.5 min of walking in 6 MWT Goal status: INITIAL  4.  Pt will be able to walk a mile with RPE no more than 5-6/10 RPE Baseline: SOB after 2.5 min walking on flat surface Goal status: INITIAL  5.  Pt functional specific scale with improve to at least 15/30 Baseline: 5/30 eval Goal status: INITIAL  ASSESSMENT:  CLINICAL IMPRESSION: Pt reports she continues to attend stretch class. Began on T.M for baseline with patient completing 9 minutes total prior to stopping due to increased left lower leg pain. Her RPE scale remained between 3-4 (moderate) exertion. Pt has tenderness in left calf which decrease with STW. She had poor tolerance to standing gastrc stretch but did well with seated version. This was added to HEP. Visually she has reduced ankle DF left. She also c/o not being able to cross right leg and was given figure 4 stretch to address this. Will plan to add hip ER strength and progress as able. Pt will be out of town until 09/26/23.    EVAL: Patient is a 83 y.o. female who was seen today for physical therapy evaluation and treatment for generalized weakness and deconditioning with SOB and lack of endurance with daily tasks.  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  Pt will benefit from skilled PT to address SOB, endurance, generalized weakness and occasional pain on left lower lateral leg.  .   OBJECTIVE IMPAIRMENTS: cardiopulmonary status limiting activity, decreased activity tolerance, decreased endurance, decreased knowledge of condition, decreased mobility, decreased ROM, decreased strength, pain, and SOB.   ACTIVITY LIMITATIONS: carrying, lifting, standing, squatting, stairs, and locomotion level  PARTICIPATION LIMITATIONS: meal prep, cleaning, laundry, community activity, yard work, and church  PERSONAL FACTORS: See pertinent history SOB/endurance are also affecting patient's functional outcome.   REHAB POTENTIAL: Good  CLINICAL DECISION MAKING: Evolving/moderate complexity  EVALUATION COMPLEXITY: Moderate  PLAN:  PT FREQUENCY: 1-2x/week  PT  DURATION: 8 weeks  PLANNED INTERVENTIONS: 97164- PT Re-evaluation, 97110-Therapeutic exercises, 97530- Therapeutic activity, 97112- Neuromuscular re-education, 97535- Self Care, 04540- Manual therapy, [phone]- Gait training, [phone]- Aquatic Therapy, Patient/Family education, Balance training, Stair training, Taping, Dry Needling, Joint mobilization, Spinal mobilization, Vestibular training, Cryotherapy, and Moist heat  PLAN FOR NEXT SESSION: Treadmill test for 15 min and monitor SOB,  basic back exercise, STW to calf    Referring diagnosis? Generalized weakness Treatment diagnosis? (if different than referring diagnosis) left sciatica as well What was this (referring dx) caused by? []  Surgery []  Fall []  Ongoing issue [x]  Arthritis [x]  Other: __deconditioning__________  Laterality: []  Rt [x]  Lt [x]  Both  Check all possible CPT codes:  *CHOOSE 10 OR LESS*    See Planned Interventions listed in the Plan section of the Evaluation.    Gasper Karst, PTA 08/29/23 3:45 PM Phone: [phone] Fax: [phone]   Sharlet Dawson, PT, [facility] Certified Exercise Expert for the Aging Adult  11/06/23 9:24 AM Phone: [phone] Fax: [phone]

## 2023-09-04 DIAGNOSIS — H0102A Squamous blepharitis right eye, upper and lower eyelids: Secondary | ICD-10-CM | POA: Diagnosis not present

## 2023-09-04 DIAGNOSIS — H0102B Squamous blepharitis left eye, upper and lower eyelids: Secondary | ICD-10-CM | POA: Diagnosis not present

## 2023-09-04 DIAGNOSIS — Z01419 Encounter for gynecological examination (general) (routine) without abnormal findings: Secondary | ICD-10-CM | POA: Diagnosis not present

## 2023-09-04 DIAGNOSIS — Z961 Presence of intraocular lens: Secondary | ICD-10-CM | POA: Diagnosis not present

## 2023-09-04 DIAGNOSIS — E119 Type 2 diabetes mellitus without complications: Secondary | ICD-10-CM | POA: Diagnosis not present

## 2023-09-04 DIAGNOSIS — H04123 Dry eye syndrome of bilateral lacrimal glands: Secondary | ICD-10-CM | POA: Diagnosis not present

## 2023-09-04 DIAGNOSIS — N8110 Cystocele, unspecified: Secondary | ICD-10-CM | POA: Diagnosis not present

## 2023-09-04 DIAGNOSIS — H40013 Open angle with borderline findings, low risk, bilateral: Secondary | ICD-10-CM | POA: Diagnosis not present

## 2023-09-04 DIAGNOSIS — H1013 Acute atopic conjunctivitis, bilateral: Secondary | ICD-10-CM | POA: Diagnosis not present

## 2023-09-11 ENCOUNTER — Ambulatory Visit: Payer: Medicare HMO | Admitting: Student

## 2023-09-14 ENCOUNTER — Ambulatory Visit: Payer: Medicare HMO

## 2023-09-17 ENCOUNTER — Ambulatory Visit

## 2023-09-18 ENCOUNTER — Ambulatory Visit (INDEPENDENT_AMBULATORY_CARE_PROVIDER_SITE_OTHER)

## 2023-09-18 ENCOUNTER — Encounter: Payer: Self-pay | Admitting: Pulmonary Disease

## 2023-09-18 ENCOUNTER — Ambulatory Visit: Payer: Medicare HMO | Admitting: Pulmonary Disease

## 2023-09-18 VITALS — BP 158/78 | HR 59 | Ht 65.0 in | Wt 185.0 lb

## 2023-09-18 DIAGNOSIS — R0602 Shortness of breath: Secondary | ICD-10-CM | POA: Diagnosis not present

## 2023-09-18 DIAGNOSIS — J8283 Eosinophilic asthma: Secondary | ICD-10-CM | POA: Diagnosis not present

## 2023-09-18 DIAGNOSIS — J4550 Severe persistent asthma, uncomplicated: Secondary | ICD-10-CM

## 2023-09-18 NOTE — Progress Notes (Signed)
 Synopsis: Referred in April 2024 for shortness of breath  Subjective:   PATIENT ID: Megan Hull GENDER: female DOB: [birth year], MRN: [id number]  HPI  Chief Complaint  Patient presents with   Consult    Pt states past 6 months her SOB has become worse even while sitting    Megan Hull is an 83 year old woman, former smoker with CHF, DMII, GERD and hypertension who is referred to pulmonary clinic for shortness of breath.  She has experienced shortness of breath for several years, which has recently worsened. Initially, it was only with exertion, but now it occurs with minimal activities such as moving around, putting on shoes, and talking. The shortness of breath is more pronounced when walking uphill or on an incline, often requiring her to stop to catch her breath.  She uses a Breztri inhaler, taking one puff in the morning, and occasionally uses albuterol when experiencing significant shortness of breath or back pain. Previously, she used albuterol routinely when she had wheezing, but since the wheezing has resolved, she uses it less frequently. She also uses Fasenra injections, which have significantly improved her breathing and reduced hospitalizations for shortness of breath.  She has a history of arrhythmia and is currently on amiodarone, which was adjusted from 200 mg to 100 mg due to elevated liver enzymes. She also mentions a history of aortic stenosis, which has progressed from mild to mild-to-moderate, and mild COPD, attributed to her smoking history until 2017.  She experiences occasional shortness of breath at night, which sometimes wakes her up, but she does not wake up in a panic. She has noticed weight gain of about 10 pounds over the last year, which she attributes to decreased metabolism with age.  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 She quit smoking in 2017 after a severe allergy attack that led to  hospitalization. She engages in physical activities such as walking at the [facility] and doing yard work, but notes that her activities are often sedentary, such as working at [employer].  Past Medical History:  Diagnosis Date   Arthritis    CHF (congestive heart failure) (HCC)    Diabetes mellitus without complication (HCC)    borderline   Diarrhea    Diverticulosis    Dysrhythmia    controlled with Metoprolol-12-19-13 LOV -Dr. Allyson Sabal sees yearly   Family history of premature CAD    GERD (gastroesophageal reflux disease)    Heart murmur    yrs ago   HLD (hyperlipidemia)    pt reports cholesterol normal now (07/21/19).   Hypertension    Lower extremity edema    Status post dilation of esophageal narrowing    Supraventricular arrhythmia    Tobacco abuse      Family History  Problem Relation Age of Onset   Heart disease Brother        MI @ 28, HTN @ 79, DM @ 69    Heart disease Father        MI   Heart disease Mother        MI, CVA @ 76   Heart disease Sister        CHF, CVA   Breast cancer Sister 35   Esophageal cancer Brother    Alzheimer's disease Sister    Heart disease Brother        MI in 50s, lung cancer   Heart disease Sister  Hypertension Sister        x2   Hyperlipidemia Sister        x2   Diabetes Sister        x2   Cancer Sister    Hypertension Brother    Colon cancer Neg Hx      Social History   Socioeconomic History   Marital status: Single    Spouse name: Not on file   Number of children: 0   Years of education: 12+   Highest education level: Not on file  Occupational History   Occupation: Reitred  Tobacco Use   Smoking status: Former    Current packs/day: 0.00    Average packs/day: 0.3 packs/day for 15.0 years (3.8 ttl pk-yrs)    Types: Cigarettes    Start date: 02/03/2001    Quit date: 02/04/2016    Years since quitting: 7.6   Smokeless tobacco: Never  Vaping Use   Vaping status: Never Used  Substance and Sexual Activity   Alcohol use:  Not Currently    Comment: occasional - tequila shots on holidays   Drug use: No   Sexual activity: Not on file  Other Topics Concern   Not on file  Social History Narrative   Lives alone    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)   Social Drivers of Corporate investment banker Strain: Not on file  Food Insecurity: Not on file  Transportation Needs: Not on file  Physical Activity: Not on file  Stress: Not on file  Social Connections: Not on file  Intimate Partner Violence: Not on file     Allergies  Allergen Reactions   Nexium [Esomeprazole Magnesium] Anaphylaxis    Pt states she was unable to swallow for several hours after taking the nexium   Ace Inhibitors Swelling    Angioedema    Codeine Other (See Comments)    REACTION: chest pain   Monosodium Glutamate Other (See Comments)    dizziness   Other Hives, Swelling and Other (See Comments)    Red Fish (Hives) Bojangle's chicken (swelling and shortness of breath) PAPRIKA   Amlodipine Other (See Comments)    Lower extremity swelling, itching   Latex Rash    Causes skin to turn red per patient    Shellfish Allergy Hives   Tape Rash    Coban wrap turned her arm red (after stress test)     Outpatient Medications Prior to Visit  Medication Sig Dispense Refill   albuterol (VENTOLIN HFA) 108 (90 Base) MCG/ACT inhaler Inhale 2 puffs into the lungs every 4 (four) hours as needed for wheezing or shortness of breath. 18 g 1   amiodarone (PACERONE) 200 MG tablet Take 0.5 tablets (100 mg total) by mouth daily. 45 tablet 1   aspirin EC 81 MG tablet Take 1 tablet (81 mg total) by mouth daily. Swallow whole. 90 tablet 3   benralizumab (FASENRA) 30 MG/ML prefilled syringe Inject 1 mL (30 mg total) into the skin every 8 (eight) weeks. 1 mL 6   Budeson-Glycopyrrol-Formoterol (BREZTRI AEROSPHERE) 160-9-4.8 MCG/ACT AERO Inhale 2 puffs into the lungs in the morning and at bedtime. With spacer 32.1 g 1   cetirizine  (ZYRTEC) 10 MG tablet TAKE 1 TABLET 2 (TWO) TIMES DAILY AS NEEDED FOR ALLERGIES (CAN USE AN EXTRA DOSE DURING FLARE UPS.) 180 tablet 1   EPINEPHrine 0.3 mg/0.3 mL IJ SOAJ injection Inject 0.3 mg into the muscle as needed (anaphylaxis). 2 each 1  famotidine (PEPCID) 40 MG tablet TAKE 1 TABLET AT BEDTIME 30 tablet 0   FLUAD QUADRIVALENT 0.5 ML injection Inject 0.5 mLs into the muscle once.     fluticasone (FLONASE) 50 MCG/ACT nasal spray Place 1 spray into both nostrils daily. 48 g 1   furosemide (LASIX) 40 MG tablet Take 1 tablet (40 mg) daily and an extra half tablet (20 mg) daily as needed. 135 tablet 1   hydrALAZINE (APRESOLINE) 25 MG tablet Take 0.5 tablets (12.5 mg total) by mouth 3 (three) times daily as needed (12.5 mg up to three times daily as needed for Systolic BP greater than 160). 270 tablet 1   ipratropium-albuterol (DUONEB) 0.5-2.5 (3) MG/3ML SOLN Take 3 mLs by nebulization every 6 (six) hours as needed (wheezing, shortness of breath). 150 mL 1   isosorbide mononitrate (IMDUR) 30 MG 24 hr tablet Take 1 tablet (30 mg total) by mouth daily. 90 tablet 1   levocetirizine (XYZAL) 5 MG tablet Take 1 tablet (5 mg total) by mouth daily as needed for allergies (Can take an extra dose during flare ups.). 180 tablet 1   metoprolol succinate (TOPROL-XL) 50 MG 24 hr tablet Take 1 tablet (50 mg total) by mouth at bedtime. Take with or immediately following a meal. 90 tablet 1   montelukast (SINGULAIR) 10 MG tablet Take 1 tablet (10 mg total) by mouth at bedtime. 90 tablet 1   omeprazole (PRILOSEC) 40 MG capsule Take 1 capsule (40 mg total) by mouth daily. 30 capsule 5   Probiotic Product (PROBIOTIC DAILY PO) Take 1 capsule by mouth daily.     Respiratory Therapy Supplies (NEBULIZER MASK ADULT) MISC 1 Device by Does not apply route as directed. 1 each 1   simvastatin (ZOCOR) 40 MG tablet Take 1 tablet (40 mg total) by mouth daily. 90 tablet 1   Spacer/Aero-Holding Chambers (AEROCHAMBER MV) inhaler 1  each by Other route daily. Use as instructed 1 each 1   budesonide (PULMICORT) 0.25 MG/2ML nebulizer solution Take 2 mLs (0.25 mg total) by nebulization 2 (two) times daily as needed (shortness of breath, wheezing). 120 mL 1   SPIKEVAX injection Inject 0.5 mLs into the muscle once. (Patient not taking: Reported on 09/18/2023)     Facility-Administered Medications Prior to Visit  Medication Dose Route Frequency Provider Last Rate Last Admin   Benralizumab SOSY 30 mg  30 mg Subcutaneous Q8 weeks Marcelyn Bruins, MD   30 mg at 07/19/23 1331    Review of Systems  Constitutional:  Negative for chills, fever, malaise/fatigue and weight loss.  HENT:  Negative for congestion, sinus pain and sore throat.   Eyes: Negative.   Respiratory:  Positive for shortness of breath. Negative for cough, hemoptysis, sputum production and wheezing.   Cardiovascular:  Negative for chest pain, palpitations, orthopnea, claudication and leg swelling.  Gastrointestinal:  Negative for abdominal pain, heartburn, nausea and vomiting.  Genitourinary: Negative.   Musculoskeletal:  Negative for joint pain and myalgias.  Skin:  Negative for rash.  Neurological:  Negative for weakness.  Endo/Heme/Allergies: Negative.   Psychiatric/Behavioral: Negative.        Objective:   Vitals:   09/18/23 1020  BP: (!) 158/78  Pulse: (!) 59  SpO2: 96%  Weight: 185 lb (83.9 kg)  Height: 5\' 5"  (1.651 m)     Physical Exam Constitutional:      General: She is not in acute distress.    Appearance: Normal appearance.  Eyes:     General: No  scleral icterus.    Conjunctiva/sclera: Conjunctivae normal.  Cardiovascular:     Rate and Rhythm: Normal rate and regular rhythm.  Pulmonary:     Breath sounds: No wheezing, rhonchi or rales.  Musculoskeletal:     Right lower leg: No edema.     Left lower leg: No edema.  Skin:    General: Skin is warm and dry.  Neurological:     General: No focal deficit present.      CBC    Component Value Date/Time   WBC 4.4 07/19/2023 1235   WBC 4.6 09/01/2021 0840   RBC 4.31 07/19/2023 1235   RBC 4.50 09/01/2021 0840   HGB 12.9 07/19/2023 1235   HCT 40.1 07/19/2023 1235   PLT 229 07/19/2023 1235   MCV 93 07/19/2023 1235   MCH 29.9 07/19/2023 1235   MCH 29.1 09/01/2021 0840   MCHC 32.2 07/19/2023 1235   MCHC 32.3 09/01/2021 0840   RDW 12.8 07/19/2023 1235   LYMPHSABS 2.3 08/31/2021 2308   MONOABS 0.6 08/31/2021 2308   EOSABS 0.0 08/31/2021 2308   BASOSABS 0.0 08/31/2021 2308      Latest Ref Rng & Units 05/22/2023    3:38 PM 03/14/2023   10:15 AM 10/03/2022   11:05 AM  BMP  Glucose 70 - 99 mg/dL 88  244  010   BUN 8 - 27 mg/dL 17  18  17    Creatinine 0.57 - 1.00 mg/dL 2.72  5.36  6.44   BUN/Creat Ratio 12 - 28 14  14  14    Sodium 134 - 144 mmol/L 139  137  142   Potassium 3.5 - 5.2 mmol/L 4.2  4.1  4.2   Chloride 96 - 106 mmol/L 100  100  102   CO2 20 - 29 mmol/L 27  26  23    Calcium 8.7 - 10.3 mg/dL 8.9  8.8  8.8    Chest imaging: CXR 09/18/23 - Personal review No pleural effusions or pneumothorax. Clear lung fields, no opacities or infiltrates.   PFT:     No data to display          Labs:  Path:  Echo 06/21/23: LV EF 70-75% with hyperdynamic function. RV size and systolic function are normal. Aortic valve is calcified with moderate thickening - moderate aortic stenosis VTI 0.85cm.  Heart Catheterization:    Assessment & Plan:   Eosinophilic asthma - Plan: Pulmonary Function Test, DG Chest 2 View  Shortness of breath - Plan: DG Chest 2 View  Discussion: Megan Hull is an 83 year old woman, former smoker with CHF, DMII, GERD and hypertension who is referred to pulmonary clinic for shortness of breath.  Shortness of breath Chronic dyspnea worsened at rest and minimal exertion. Contributing factors: severe asthma, mild COPD, weight gain, aortic stenosis. Current medications improved breathing. Discussed increasing  Breztri for evening dyspnea. - Increase Breztri to one puff twice a day. - Order chest x-ray and pulmonary function tests. - Send refill for [employer] inhaler to pharmacy.  Severe asthma Managed with Megan Hull, and albuterol. Fasenra reduced wheezing, improved breathing. No current wheezing or cough. - Continue Fasenra injections and albuterol as needed.  Aortic stenosis Progressed to mild-to-moderate. Monitored by cardiologist. Not severe enough for intervention. - Continue monitoring by cardiologist.  Weight gain Gained 10 pounds, affecting diaphragm movement, contributing to dyspnea. Encouraged increased physical activity. - Encourage increased physical activity, aiming for 20-30 minutes of continuous walking 4-5 days per week.  Sleep apnea (suspected)  Occasional nocturnal dyspnea and fatigue suggest possible sleep apnea. Not interested in sleep study or CPAP. - Consider home sleep study in the future if symptoms persist or worsen.  Follow-up Follow-up in three months for reassessment. Advised to contact clinic if symptoms worsen. - Schedule follow-up appointment in three months.  Melody Comas, MD [facility] Office: [phone]    Current Outpatient Medications:    albuterol (VENTOLIN HFA) 108 (90 Base) MCG/ACT inhaler, Inhale 2 puffs into the lungs every 4 (four) hours as needed for wheezing or shortness of breath., Disp: 18 g, Rfl: 1   amiodarone (PACERONE) 200 MG tablet, Take 0.5 tablets (100 mg total) by mouth daily., Disp: 45 tablet, Rfl: 1   aspirin EC 81 MG tablet, Take 1 tablet (81 mg total) by mouth daily. Swallow whole., Disp: 90 tablet, Rfl: 3   benralizumab (FASENRA) 30 MG/ML prefilled syringe, Inject 1 mL (30 mg total) into the skin every 8 (eight) weeks., Disp: 1 mL, Rfl: 6   Budeson-Glycopyrrol-Formoterol (BREZTRI AEROSPHERE) 160-9-4.8 MCG/ACT AERO, Inhale 2 puffs into the lungs in the morning and at bedtime. With spacer, Disp:  32.1 g, Rfl: 1   cetirizine (ZYRTEC) 10 MG tablet, TAKE 1 TABLET 2 (TWO) TIMES DAILY AS NEEDED FOR ALLERGIES (CAN USE AN EXTRA DOSE DURING FLARE UPS.), Disp: 180 tablet, Rfl: 1   EPINEPHrine 0.3 mg/0.3 mL IJ SOAJ injection, Inject 0.3 mg into the muscle as needed (anaphylaxis)., Disp: 2 each, Rfl: 1   famotidine (PEPCID) 40 MG tablet, TAKE 1 TABLET AT BEDTIME, Disp: 30 tablet, Rfl: 0   FLUAD QUADRIVALENT 0.5 ML injection, Inject 0.5 mLs into the muscle once., Disp: , Rfl:    fluticasone (FLONASE) 50 MCG/ACT nasal spray, Place 1 spray into both nostrils daily., Disp: 48 g, Rfl: 1   furosemide (LASIX) 40 MG tablet, Take 1 tablet (40 mg) daily and an extra half tablet (20 mg) daily as needed., Disp: 135 tablet, Rfl: 1   hydrALAZINE (APRESOLINE) 25 MG tablet, Take 0.5 tablets (12.5 mg total) by mouth 3 (three) times daily as needed (12.5 mg up to three times daily as needed for Systolic BP greater than 160)., Disp: 270 tablet, Rfl: 1   ipratropium-albuterol (DUONEB) 0.5-2.5 (3) MG/3ML SOLN, Take 3 mLs by nebulization every 6 (six) hours as needed (wheezing, shortness of breath)., Disp: 150 mL, Rfl: 1   isosorbide mononitrate (IMDUR) 30 MG 24 hr tablet, Take 1 tablet (30 mg total) by mouth daily., Disp: 90 tablet, Rfl: 1   levocetirizine (XYZAL) 5 MG tablet, Take 1 tablet (5 mg total) by mouth daily as needed for allergies (Can take an extra dose during flare ups.)., Disp: 180 tablet, Rfl: 1   metoprolol succinate (TOPROL-XL) 50 MG 24 hr tablet, Take 1 tablet (50 mg total) by mouth at bedtime. Take with or immediately following a meal., Disp: 90 tablet, Rfl: 1   montelukast (SINGULAIR) 10 MG tablet, Take 1 tablet (10 mg total) by mouth at bedtime., Disp: 90 tablet, Rfl: 1   omeprazole (PRILOSEC) 40 MG capsule, Take 1 capsule (40 mg total) by mouth daily., Disp: 30 capsule, Rfl: 5   Probiotic Product (PROBIOTIC DAILY PO), Take 1 capsule by mouth daily., Disp: , Rfl:    Respiratory Therapy Supplies  (NEBULIZER MASK ADULT) MISC, 1 Device by Does not apply route as directed., Disp: 1 each, Rfl: 1   simvastatin (ZOCOR) 40 MG tablet, Take 1 tablet (40 mg total) by mouth daily., Disp: 90 tablet, Rfl: 1  Spacer/Aero-Holding Chambers (AEROCHAMBER MV) inhaler, 1 each by Other route daily. Use as instructed, Disp: 1 each, Rfl: 1   SPIKEVAX injection, Inject 0.5 mLs into the muscle once. (Patient not taking: Reported on 09/18/2023), Disp: , Rfl:   Current Facility-Administered Medications:    Benralizumab SOSY 30 mg, 30 mg, Subcutaneous, Q8 weeks, Padgett, Pilar Grammes, MD, 30 mg at 07/19/23 1331

## 2023-09-18 NOTE — Patient Instructions (Addendum)
 Use breztri 1 puff twice daily - rinse mouth out after each use  Use albuterol inhaler or nebulizer treatment every 4-6 hours as needed  Continue fasenra injections  Continue montelukast daily  We will check pulmonary function tests and a Chest X-ray  Recommend increasing your walking time/distance to work on weight loss  Follow up in 3 months

## 2023-09-19 ENCOUNTER — Ambulatory Visit

## 2023-09-19 DIAGNOSIS — J4550 Severe persistent asthma, uncomplicated: Secondary | ICD-10-CM

## 2023-09-20 ENCOUNTER — Ambulatory Visit: Payer: Medicare HMO | Admitting: Dietician

## 2023-09-20 DIAGNOSIS — L2989 Other pruritus: Secondary | ICD-10-CM | POA: Diagnosis not present

## 2023-09-20 DIAGNOSIS — L503 Dermatographic urticaria: Secondary | ICD-10-CM | POA: Diagnosis not present

## 2023-09-24 ENCOUNTER — Ambulatory Visit: Attending: General Practice | Admitting: Physical Therapy

## 2023-09-24 ENCOUNTER — Ambulatory Visit (INDEPENDENT_AMBULATORY_CARE_PROVIDER_SITE_OTHER): Payer: Medicare HMO | Admitting: Podiatry

## 2023-09-26 ENCOUNTER — Encounter: Admitting: Physical Therapy

## 2023-10-02 ENCOUNTER — Encounter: Payer: Self-pay | Admitting: Podiatry

## 2023-10-02 ENCOUNTER — Ambulatory Visit (INDEPENDENT_AMBULATORY_CARE_PROVIDER_SITE_OTHER): Admitting: Podiatry

## 2023-10-02 VITALS — Ht 65.0 in | Wt 185.0 lb

## 2023-10-02 DIAGNOSIS — M79675 Pain in left toe(s): Secondary | ICD-10-CM | POA: Diagnosis not present

## 2023-10-02 DIAGNOSIS — B351 Tinea unguium: Secondary | ICD-10-CM

## 2023-10-02 DIAGNOSIS — E1151 Type 2 diabetes mellitus with diabetic peripheral angiopathy without gangrene: Secondary | ICD-10-CM

## 2023-10-02 DIAGNOSIS — M79674 Pain in right toe(s): Secondary | ICD-10-CM

## 2023-10-02 NOTE — Progress Notes (Signed)
 Subjective:  Patient ID: Megan Hull, female    DOB: 06/11/1941,  MRN: [id number]  Megan Hull presents to clinic today for:  Chief Complaint  Patient presents with   Nail Problem    Patient is here for [facility]   Patient notes nails are thick and elongated, causing pain in shoe gear when ambulating.  She notes that the left third toenail fell off at home after her shower.  She brought it with her today for us  to look at.  Denies injury.  PCP is Pete Brand, DO.  Past Medical History:  Diagnosis Date   Arthritis    CHF (congestive heart failure) (HCC)    Diabetes mellitus without complication (HCC)    borderline   Diarrhea    Diverticulosis    Dysrhythmia    controlled with Metoprolol -12-19-13 LOV -Dr. Katheryne Pane sees yearly   Family history of premature CAD    GERD (gastroesophageal reflux disease)    Heart murmur    yrs ago   HLD (hyperlipidemia)    pt reports cholesterol normal now (07/21/19).   Hypertension    Lower extremity edema    Status post dilation of esophageal narrowing    Supraventricular arrhythmia    Tobacco abuse     Allergies  Allergen Reactions   Nexium  [Esomeprazole  Magnesium ] Anaphylaxis    Pt states she was unable to swallow for several hours after taking the nexium    Ace Inhibitors Swelling    Angioedema    Codeine Other (See Comments)    REACTION: chest pain   Monosodium Glutamate Other (See Comments)    dizziness   Other Hives, Swelling and Other (See Comments)    Red Fish (Hives) Bojangle's chicken (swelling and shortness of breath) PAPRIKA   Amlodipine  Other (See Comments)    Lower extremity swelling, itching   Latex Rash    Causes skin to turn red per patient    Shellfish Allergy Hives   Tape Rash    Coban wrap turned her arm red (after stress test)    Objective:  Megan Hull is a pleasant 83 y.o. female in NAD. AAO x 3.  Vascular Examination: Patient has palpable DP pulse, absent PT pulse bilateral.   Delayed capillary refill bilateral toes.  Sparse digital hair bilateral.  Proximal to distal cooling WNL bilateral.    Dermatological Examination: Interspaces are clear with no open lesions noted bilateral.  Skin is shiny and atrophic bilateral.  Nails are 3-94mm thick, with yellowish/brown discoloration, subungual debris and distal onycholysis x 9.  There is pain with compression of nails x 9.  The nailbed of the left third toe is dry and stable and does not need further care  Musculoskeletal Examination: There is mild lateral drifting of the left second toe which is causing some overlapping with the third toe when the forefoot is loaded..  No interdigital lesions are noted.  No significant contractures noted of the PIP joints.  Patient qualifies for at-risk foot care because of diabetes with PVD.  Assessment/Plan: 1. Pain due to onychomycosis of toenails of both feet   2. Type II diabetes mellitus with peripheral circulatory disorder (HCC)    Mycotic nails x 9 were sharply debrided with sterile nail nippers and power debriding burr to decrease bulk and length.  To gel toe spacers were dispensed for patient to try between the 2nd and 3rd toes.  I  Return in about 3 months (around 01/01/2024) for [facility].  Joe Murders, DPM, FACFAS [facility]     [address] [place], [place] [address]                Office [phone]  Fax [phone]

## 2023-10-17 ENCOUNTER — Ambulatory Visit

## 2023-10-17 NOTE — Progress Notes (Signed)
 Patient was here for adjustments to DM shoes and inserts 2nd pair given back in September were inserted into shoes shoes felt too snug so then took spacers out that were also added back in Sept. 2024 and then shoes and inserts fit great  Patient is seeing Dr Wyn Heater in July and will ask then for referral to [facility] for new shoes if we are still not open for new orders Britton Cane Cped, CFo, CFm

## 2023-10-18 DIAGNOSIS — H6981 Other specified disorders of Eustachian tube, right ear: Secondary | ICD-10-CM | POA: Diagnosis not present

## 2023-10-18 DIAGNOSIS — I2510 Atherosclerotic heart disease of native coronary artery without angina pectoris: Secondary | ICD-10-CM | POA: Diagnosis not present

## 2023-10-18 DIAGNOSIS — W57XXXA Bitten or stung by nonvenomous insect and other nonvenomous arthropods, initial encounter: Secondary | ICD-10-CM | POA: Diagnosis not present

## 2023-10-18 DIAGNOSIS — J45909 Unspecified asthma, uncomplicated: Secondary | ICD-10-CM | POA: Diagnosis not present

## 2023-10-18 DIAGNOSIS — R0602 Shortness of breath: Secondary | ICD-10-CM | POA: Diagnosis not present

## 2023-11-03 ENCOUNTER — Other Ambulatory Visit: Payer: Self-pay | Admitting: Allergy and Immunology

## 2023-11-08 ENCOUNTER — Other Ambulatory Visit: Payer: Self-pay | Admitting: General Practice

## 2023-11-12 NOTE — Patient Instructions (Addendum)
  1.  Allergen avoidance measures - dust mite, cat, dog, mammal consumption  2.  Continue to treat and prevent inflammation:  A. Continue Breztri  - 1  inhalations 1-2 times per day w/spacer (empty lungs). Spacer given. B. Flonase  - 2 sprays each nostril 1-7 times per week C. Montelukast  10 mg - 1 tablet 1 time per day. Call us  and let us  know if you are taking this medication D. Continue to follow up with Pulmonology and get full PFT on 12/18/23  3.  Continue to treat and prevent reflux/LPR:  A. Omeprazole  40 mg - 1 tablet 3-7 times per week  4.  If needed:  A. Albuterol  MDI or nebulization B. Nasal saline C. Antihistamine D. Epi-Pen. She would like to hold off on sending a refill for her EpiPen  until her next office visit. We will get lab work to follow up on your alpha gal allergy. We will call you with results once they are back  5. Continue benralizumab  injections every 8 weeks  6.  Return to clinic in 3 months or earlier if problem

## 2023-11-13 ENCOUNTER — Encounter: Payer: Self-pay | Admitting: Family

## 2023-11-13 ENCOUNTER — Ambulatory Visit: Payer: Medicare HMO | Admitting: Family

## 2023-11-13 ENCOUNTER — Telehealth: Payer: Self-pay | Admitting: Cardiovascular Disease

## 2023-11-13 ENCOUNTER — Ambulatory Visit

## 2023-11-13 ENCOUNTER — Other Ambulatory Visit: Payer: Self-pay | Admitting: Allergy and Immunology

## 2023-11-13 ENCOUNTER — Telehealth: Payer: Self-pay | Admitting: Family

## 2023-11-13 ENCOUNTER — Other Ambulatory Visit: Payer: Self-pay

## 2023-11-13 VITALS — BP 122/72 | HR 66 | Temp 97.8°F | Resp 16 | Ht 65.0 in | Wt 184.2 lb

## 2023-11-13 DIAGNOSIS — J339 Nasal polyp, unspecified: Secondary | ICD-10-CM

## 2023-11-13 DIAGNOSIS — K219 Gastro-esophageal reflux disease without esophagitis: Secondary | ICD-10-CM | POA: Diagnosis not present

## 2023-11-13 DIAGNOSIS — T7800XA Anaphylactic reaction due to unspecified food, initial encounter: Secondary | ICD-10-CM | POA: Diagnosis not present

## 2023-11-13 DIAGNOSIS — J4550 Severe persistent asthma, uncomplicated: Secondary | ICD-10-CM

## 2023-11-13 DIAGNOSIS — T7800XD Anaphylactic reaction due to unspecified food, subsequent encounter: Secondary | ICD-10-CM

## 2023-11-13 DIAGNOSIS — J3089 Other allergic rhinitis: Secondary | ICD-10-CM

## 2023-11-13 NOTE — Telephone Encounter (Addendum)
 Spoke with pt and advised per Dr Addie Holstein - have her take additional full dose x 2 days to see if edema goes down. once @ baseline return to normal dose.  Pt will contact office if no improvement.  Pt verbalizes understanding of taking Furosemide  40mg  - 2 tablets by mouth daily x 2 days and agrees with current plan. Pt thanked Charity fundraiser for the callback.

## 2023-11-13 NOTE — Telephone Encounter (Signed)
 Spoke with pt who reports she has had increasing LEE since last Thursday.  Weight is up this morning 2 pounds from yesterday.  She has been taking an extra half tablet of Furosemide  40mg  as advised since last Thursday.  Pt denies chest pain, new or increasing SOB or dizziness.  She reports BP has been WNL.  She is following a low sodium diet and wearing compression stockings daily.  Pt reports increased urination with the extra Furosemide  20mg . Pt advised Dr Katheryne Pane and Lawana Pray are both out of the office today but will forward to DOD for review and recommendation.  Pt verbalized understanding and agrees with current plan.

## 2023-11-13 NOTE — Telephone Encounter (Signed)
 Pt c/o swelling/edema: STAT if pt has developed SOB within 24 hours  If swelling, where is the swelling located?  Hands and legs  How much weight have you gained and in what time span?   Yes  Have you gained 2 pounds in a day or 5 pounds in a week?    Do you have a log of your daily weights (if so, list)?   No  Are you currently taking a fluid pill?   Yes  Are you currently SOB?  Not worse than normal  Have you traveled recently in a car or plane for an extended period of time?   Patient noted her weight is normally 178-180. Patient stated yesterday weight was 179 and she noticed her legs were swollen after taking off her support stocking.

## 2023-11-13 NOTE — Telephone Encounter (Signed)
 Megan Hull called and stated that per her visit today, she was to call and let Megan Hull know that she is no longer taking the Montelukast , as she thought it was the same as Zyrtec . She wants someone to call her back so they can discuss with her if she needs to start taking it again. Best contact [phone]

## 2023-11-13 NOTE — Progress Notes (Addendum)
 [address] [place] [address] Dept: [phone]  FOLLOW UP NOTE  Patient ID: Megan Hull, female    DOB: 02-Apr-1941  Age: 83 y.o. MRN: [id number] Date of Office Visit: 11/13/2023  Assessment  Chief Complaint: Asthma (Pt reports that she has not had to us  nebulizer machine lately but last night became SOB. Used the machine, seemed to help. Spoke w/ cardio and was told to up her lasix  for 2 days due to water weight gain.) and Allergic Rhinitis  (No complaints.)  HPI Megan Hull is an 83 year old female who presents today for follow-up of not well-controlled severe persistent asthma, perennial allergic rhinitis, chronic pansinusitis, nasal polyposis, allergy with anaphylaxis due to food, and laryngopharyngeal reflux disease.  She was last seen on May 22, 2023 by Dr. Jerelene Monday.  She denies any new diagnosis or surgery since her last office visit.  Severe persistent asthma: She continues to take Breztri  1 puff once a day with spacer and on some days she will do 1 puff twice a day.  She reports that if she takes the nighttime dose of Breztri  she will have hoarse voice and nasal congestion.  This will last till about 2 or 3 PM the next day and then clears up.  She mentions that on Friday nights and Saturday nights she does not do her nighttime dose of Breztri  due to having to sing on Sunday in the choir.  The other night she tries to do the 1 inhalation twice a day.  She double gargles and then drinks coffee.  She does not have the symptoms if she only takes the morning dose of Breztri .  She does not think that she is taking montelukast  10 mg daily.  She also continues to receive benralizumab  injections per protocol.  She denies any problems or reactions with benralizumab  and reports that they have helped tremendously.  She does mention that last night she had wheezing and shortness of breath so she used her albuterol  via her nebulizer and it cleared it up.  This is the first  time in months that she has had to use albuterol .  Since her last office visit she did see a pulmonologist, Dr. Diania Fortes at the recommendations of her cardiologist due to her shortness of breath.  She is scheduled to have a full PFT on July 8. She did have a chest x-ray on 09/18/23 showing:" no active cardiopulmonary disease."  She also spoke with her cardiologist office today due to gaining 3 pounds and feeling heavy in her legs.  She was instructed to increase her Lasix  to 80 mg once a day for the next 2 to 3 days.  She normally takes Lasix  40 mg once a day.  Allergic rhinitis: She reports nasal congestion if she uses Breztri  1 puff at night and reports postnasal drip after using Breztri  1 inhalation at night.  She denies rhinorrhea.  She has not been treated for any sinus infections since we last saw her.  She has Flonase  nasal spray that she uses as needed and takes Zyrtec  daily.  She does not think that she is taking montelukast  10 mg daily.  Laryngopharyngeal reflux disease is reported as doing great.  She continues to take omeprazole  40 mg once a day as needed.  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  She reports that she has been avoiding red meat since 2017.  She reports that since her last office visit she has been  bit by a tick twice.  She went to her primary care physician who did not think she had Lyme's disease.  She would like for us  to hold off on sending a refill for her EpiPen  until her next office visit.     Drug Allergies:  Allergies  Allergen Reactions   Nexium  [Esomeprazole  Magnesium ] Anaphylaxis    Pt states she was unable to swallow for several hours after taking the nexium    Ace Inhibitors Swelling    Angioedema    Codeine Other (See Comments)    REACTION: chest pain   Monosodium Glutamate Other (See Comments)    dizziness   Other Hives, Swelling and Other (See Comments)    Red Fish (Hives) Bojangle's chicken  (swelling and shortness of breath) PAPRIKA   Amlodipine  Other (See Comments)    Lower extremity swelling, itching   Latex Rash    Causes skin to turn red per patient    Shellfish Allergy Hives   Tape Rash    Coban wrap turned her arm red (after stress test)    Review of Systems: Negative except as per HPI   Physical Exam: BP 122/72 (BP Location: Left Arm, Patient Position: Sitting, Cuff Size: Normal)   Pulse 66   Temp 97.8 F (36.6 C) (Temporal)   Resp 16   Ht 5\' 5"  (1.651 m)   Wt 184 lb 3.2 oz (83.6 kg)   SpO2 96%   BMI 30.65 kg/m    Physical Exam Constitutional:      Appearance: Normal appearance.  HENT:     Head: Normocephalic and atraumatic.     Comments: Pharynx normal, eyes normal, ears normal, nose normal    Right Ear: Tympanic membrane, ear canal and external ear normal.     Left Ear: Tympanic membrane, ear canal and external ear normal.     Nose: Nose normal.     Mouth/Throat:     Mouth: Mucous membranes are moist.     Pharynx: Oropharynx is clear.  Eyes:     Conjunctiva/sclera: Conjunctivae normal.  Cardiovascular:     Rate and Rhythm: Regular rhythm.     Heart sounds: Normal heart sounds.  Pulmonary:     Effort: Pulmonary effort is normal.     Breath sounds: Normal breath sounds.     Comments: Lungs clear to auscultation Musculoskeletal:     Cervical back: Neck supple.  Skin:    General: Skin is warm.  Neurological:     Mental Status: She is alert and oriented to person, place, and time.  Psychiatric:        Mood and Affect: Mood normal.        Behavior: Behavior normal.        Thought Content: Thought content normal.        Judgment: Judgment normal.     Diagnostics: FVC 1.88 L (87%), FEV1 1.43 L (87%), FEV1/FVC 0.76.  Spirometry indicates normal spirometry.  Assessment and Plan: 1. Allergy with anaphylaxis due to food   2. Severe persistent asthma without complication   3. Perennial allergic rhinitis   4. LPRD (laryngopharyngeal reflux  disease)   5. Nasal polyposis     No orders of the defined types were placed in this encounter.   Patient Instructions   1.  Allergen avoidance measures - dust mite, cat, dog, mammal consumption  2.  Continue to treat and prevent inflammation:  A. Continue Breztri  - 1  inhalations 1-2 times per day w/spacer (empty lungs). Spacer given.  B. Flonase  - 2 sprays each nostril 1-7 times per week C. Montelukast  10 mg - 1 tablet 1 time per day. Call us  and let us  know if you are taking this medication D. Continue to follow up with Pulmonology and get full PFT on 12/18/23  3.  Continue to treat and prevent reflux/LPR:  A. Omeprazole  40 mg - 1 tablet 3-7 times per week  4.  If needed:  A. Albuterol  MDI or nebulization B. Nasal saline C. Antihistamine D. Epi-Pen. She would like to hold off on sending a refill for her EpiPen  until her next office visit. We will get lab work to follow up on your alpha gal allergy. We will call you with results once they are back  5. Continue benralizumab  injections every 8 weeks  6.  Return to clinic in 3 months or earlier if problem Return in about 3 months (around 02/13/2024), or if symptoms worsen or fail to improve.    Thank you for the opportunity to care for this patient.  Please do not hesitate to contact me with questions.  Tinnie Forehand, FNP [facility] 

## 2023-11-13 NOTE — Telephone Encounter (Signed)
 Megan Hull patient called back to inform us  she is not taking the montelukast . Patient is wondering if she should start taking it, please advise.

## 2023-11-13 NOTE — Addendum Note (Signed)
 Addended by: Lavontae Cornia E on: 11/13/2023 04:03 PM   Modules accepted: Orders

## 2023-11-13 NOTE — Telephone Encounter (Signed)
 Agree

## 2023-11-14 ENCOUNTER — Ambulatory Visit

## 2023-11-14 MED ORDER — MONTELUKAST SODIUM 10 MG PO TABS: 10.0000 mg | ORAL_TABLET | Freq: Every evening | ORAL | 1 refills | Status: DC

## 2023-11-14 NOTE — Telephone Encounter (Signed)
 Please let Megan Hull know that I am ok with her restarting Singulair  10 mg once daily.This can help with her asthma and allergies. Please caution that rarely some children/adults can experience behavioral changes after beginning montelukast . These side effects are rare, however, if you notice any change, notify the clinic and discontinue montelukast . If she would like a prescription sent please find out what pharmacy she uses.

## 2023-11-14 NOTE — Addendum Note (Signed)
 Addended by: Anthon Baston A on: 11/14/2023 12:22 PM   Modules accepted: Orders

## 2023-11-15 LAB — ALPHA-GAL PANEL
Allergen Lamb IgE: 29.8 kU/L — AB
Beef IgE: 52.1 kU/L — AB
IgE (Immunoglobulin E), Serum: 879 [IU]/mL — ABNORMAL HIGH (ref 6–495)
O215-IgE Alpha-Gal: 55.1 kU/L — AB
Pork IgE: 23.2 kU/L — AB

## 2023-11-16 ENCOUNTER — Ambulatory Visit: Payer: Self-pay | Admitting: Family

## 2023-11-16 ENCOUNTER — Telehealth: Payer: Self-pay | Admitting: *Deleted

## 2023-11-16 NOTE — Telephone Encounter (Signed)
 She would like a referral to Jerlene Moody, MD an alpha gall specialist [address] [place], [place] [address] fax [phone]. She has been seen by this doctor before but it was 2021.   Okayed by Tinnie Forehand, FNP

## 2023-11-16 NOTE — Progress Notes (Signed)
 Please let Megan Hull know that her alpha gal panel was very high. She needs to continue to avoid mammalian meat (red meat) and have access to her epinephrine  auto injector device at all times.

## 2023-11-26 NOTE — Telephone Encounter (Signed)
 Thanks

## 2023-11-30 NOTE — Telephone Encounter (Signed)
 Mavis has been scheduled with DR. Commins at [facility] for 12/26/23 at 10.00 am.

## 2023-11-30 NOTE — Telephone Encounter (Signed)
 Thanks Joni Reining

## 2023-12-18 ENCOUNTER — Ambulatory Visit (INDEPENDENT_AMBULATORY_CARE_PROVIDER_SITE_OTHER): Admitting: Pulmonary Disease

## 2023-12-18 ENCOUNTER — Encounter: Payer: Self-pay | Admitting: Pulmonary Disease

## 2023-12-18 ENCOUNTER — Ambulatory Visit: Admitting: Pulmonary Disease

## 2023-12-18 VITALS — BP 125/72 | HR 65 | Ht 65.0 in

## 2023-12-18 DIAGNOSIS — I350 Nonrheumatic aortic (valve) stenosis: Secondary | ICD-10-CM

## 2023-12-18 DIAGNOSIS — J4550 Severe persistent asthma, uncomplicated: Secondary | ICD-10-CM | POA: Diagnosis not present

## 2023-12-18 DIAGNOSIS — R0602 Shortness of breath: Secondary | ICD-10-CM | POA: Diagnosis not present

## 2023-12-18 DIAGNOSIS — Z87891 Personal history of nicotine dependence: Secondary | ICD-10-CM

## 2023-12-18 DIAGNOSIS — J8283 Eosinophilic asthma: Secondary | ICD-10-CM | POA: Diagnosis not present

## 2023-12-18 DIAGNOSIS — R942 Abnormal results of pulmonary function studies: Secondary | ICD-10-CM

## 2023-12-18 LAB — PULMONARY FUNCTION TEST
DL/VA % pred: 94 %
DL/VA: 3.81 ml/min/mmHg/L
DLCO unc % pred: 70 %
DLCO unc: 13.64 ml/min/mmHg
FEF 25-75 Post: 1.46 L/s
FEF 25-75 Pre: 1.38 L/s
FEF2575-%Change-Post: 6 %
FEF2575-%Pred-Post: 111 %
FEF2575-%Pred-Pre: 104 %
FEV1-%Change-Post: 1 %
FEV1-%Pred-Post: 78 %
FEV1-%Pred-Pre: 77 %
FEV1-Post: 1.52 L
FEV1-Pre: 1.5 L
FEV1FVC-%Change-Post: 5 %
FEV1FVC-%Pred-Pre: 107 %
FEV6-%Change-Post: -4 %
FEV6-%Pred-Post: 74 %
FEV6-%Pred-Pre: 77 %
FEV6-Post: 1.82 L
FEV6-Pre: 1.9 L
FEV6FVC-%Pred-Post: 106 %
FEV6FVC-%Pred-Pre: 106 %
FVC-%Change-Post: -4 %
FVC-%Pred-Post: 69 %
FVC-%Pred-Pre: 73 %
FVC-Post: 1.82 L
FVC-Pre: 1.9 L
Post FEV1/FVC ratio: 83 %
Post FEV6/FVC ratio: 100 %
Pre FEV1/FVC ratio: 79 %
Pre FEV6/FVC Ratio: 100 %
RV % pred: 108 %
RV: 2.7 L
TLC % pred: 90 %
TLC: 4.7 L

## 2023-12-18 MED ORDER — BUDESONIDE-FORMOTEROL FUMARATE 80-4.5 MCG/ACT IN AERO: 2.0000 | INHALATION_SPRAY | Freq: Two times a day (BID) | RESPIRATORY_TRACT | 0 refills | Status: DC

## 2023-12-18 NOTE — Patient Instructions (Signed)
 Full pft performed today.

## 2023-12-18 NOTE — Patient Instructions (Signed)
 Your breathing tests show a non-specific pulmonary function pattern with mild diffusion defect.  We will check a high resolution CT Chest scan to evaluate the abnormalities in your breathing tests  Try symbicort  80-4.42mcg 2 puffs twice daily - rinse mouth out after each use  Stop breztri  inhaler while using the Symbicort    Use albuterol  inhaler 1-2 puffs every 4-6 hours  Continue fasenra   Follow up in 3 months to review CT Chest scan

## 2023-12-18 NOTE — Progress Notes (Signed)
 Synopsis: Referred in April 2024 for shortness of breath  Subjective:   PATIENT ID: Megan Hull GENDER: female DOB: Oct 07, 1940, MRN: [id number]  HPI  Chief Complaint  Patient presents with   Follow-up   Megan Hull is an 83 year old woman, former smoker with CHF, DMII, GERD and hypertension who returns to pulmonary clinic for asthma.  OV 09/18/23 She has experienced shortness of breath for several years, which has recently worsened. Initially, it was only with exertion, but now it occurs with minimal activities such as moving around, putting on shoes, and talking. The shortness of breath is more pronounced when walking uphill or on an incline, often requiring her to stop to catch her breath.  She uses a Breztri  inhaler, taking one puff in the morning, and occasionally uses albuterol  when experiencing significant shortness of breath or back pain. Previously, she used albuterol  routinely when she had wheezing, but since the wheezing has resolved, she uses it less frequently. She also uses Fasenra  injections, which have significantly improved her breathing and reduced hospitalizations for shortness of breath.  She has a history of arrhythmia and is currently on amiodarone , which was adjusted from 200 mg to 100 mg due to elevated liver enzymes. She also mentions a history of aortic stenosis, which has progressed from mild to mild-to-moderate, and mild COPD, attributed to her smoking history until 2017.  She experiences occasional shortness of breath at night, which sometimes wakes her up, but she does not wake up in a panic. She has noticed weight gain of about 10 pounds over the last year, which she attributes to decreased metabolism with age.  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 She quit smoking in 2017 after a severe allergy attack that led to hospitalization. She engages in physical activities such as walking at the  [facility] and doing yard work, but notes that her activities are often sedentary, such as working at [employer].  OV 12/18/23 She experiences decreased tolerance for walking up inclines without wheezing. Breztri  is used once daily due to significant hoarseness, with no improvement in breathing when used twice daily. Pulmonary function tests were completed this morning. A past chest x-ray showed a high diaphragm.  She uses Fasenra , which has significantly improved her asthma and reduced eosinophil levels, preventing hospitalizations for asthma exacerbations. She recalls a previous hospitalization for severe shortness of breath and wheezing before starting Fasenra .  She has gained three pounds recently, attributed to fluid retention from dietary changes while traveling. She follows a special diet due to alpha-gal syndrome and has been consuming more sodium-rich foods. No significant leg swelling and normal urination are noted.  Past Medical History:  Diagnosis Date   Arthritis    Asthma    CHF (congestive heart failure) (HCC)    Diabetes mellitus without complication (HCC)    borderline   Diarrhea    Diverticulosis    Dysrhythmia    controlled with Metoprolol -12-19-13 LOV -Dr. Court sees yearly   Family history of premature CAD    GERD (gastroesophageal reflux disease)    Heart murmur    yrs ago   HLD (hyperlipidemia)    pt reports cholesterol normal now (07/21/19).   Hypertension    Lower extremity edema    Status post dilation of esophageal narrowing    Supraventricular arrhythmia    Tobacco abuse      Family History  Problem Relation Age of Onset  Heart disease Brother        MI @ 67, HTN @ 50, DM @ 58    Heart disease Father        MI   Heart disease Mother        MI, CVA @ 25   Heart disease Sister        CHF, CVA   Breast cancer Sister 73   Esophageal cancer Brother    Alzheimer's disease Sister    Heart disease Brother        MI in 9s, lung cancer   Heart disease Sister     Hypertension Sister        x2   Hyperlipidemia Sister        x2   Diabetes Sister        x2   Cancer Sister    Hypertension Brother    Colon cancer Neg Hx      Social History   Socioeconomic History   Marital status: Single    Spouse name: Not on file   Number of children: 0   Years of education: 12+   Highest education level: Not on file  Occupational History   Occupation: Reitred  Tobacco Use   Smoking status: Former    Current packs/day: 0.00    Average packs/day: 0.3 packs/day for 15.0 years (3.8 ttl pk-yrs)    Types: Cigarettes    Start date: 02/03/2001    Quit date: 02/04/2016    Years since quitting: 7.8    Passive exposure: Past   Smokeless tobacco: Never  Vaping Use   Vaping status: Never Used  Substance and Sexual Activity   Alcohol  use: Not Currently    Comment: occasional - tequila shots on holidays   Drug use: No   Sexual activity: Not on file  Other Topics Concern   Not on file  Social History Narrative   Lives alone    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)   Social Drivers of Corporate investment banker Strain: Not on file  Food Insecurity: Not on file  Transportation Needs: Not on file  Physical Activity: Not on file  Stress: Not on file  Social Connections: Not on file  Intimate Partner Violence: Not on file     Allergies  Allergen Reactions   Nexium  [Esomeprazole  Magnesium ] Anaphylaxis    Pt states she was unable to swallow for several hours after taking the nexium    Ace Inhibitors Swelling    Angioedema    Codeine Other (See Comments)    REACTION: chest pain   Monosodium Glutamate Other (See Comments)    dizziness   Other Hives, Swelling and Other (See Comments)    Red Fish (Hives) Bojangle's chicken (swelling and shortness of breath) PAPRIKA   Amlodipine  Other (See Comments)    Lower extremity swelling, itching   Latex Rash    Causes skin to turn red per patient    Shellfish Allergy Hives   Tape Rash     Coban wrap turned her arm red (after stress test)     Outpatient Medications Prior to Visit  Medication Sig Dispense Refill   albuterol  (VENTOLIN  HFA) 108 (90 Base) MCG/ACT inhaler Inhale 2 puffs into the lungs every 4 (four) hours as needed for wheezing or shortness of breath. 18 g 1   amiodarone  (PACERONE ) 200 MG tablet TAKE 1 TABLET EVERY DAY 90 tablet 3   aspirin  EC 81 MG tablet Take 1 tablet (81 mg  total) by mouth daily. Swallow whole. 90 tablet 3   benralizumab  (FASENRA ) 30 MG/ML prefilled syringe Inject 1 mL (30 mg total) into the skin every 8 (eight) weeks. 1 mL 6   Budeson-Glycopyrrol-Formoterol  (BREZTRI  AEROSPHERE) 160-9-4.8 MCG/ACT AERO Inhale 2 puffs into the lungs in the morning and at bedtime. With spacer 32.1 g 1   cetirizine  (ZYRTEC ) 10 MG tablet TAKE 1 TABLET 2 (TWO) TIMES DAILY AS NEEDED FOR ALLERGIES (CAN USE AN EXTRA DOSE DURING FLARE UPS.) 180 tablet 3   EPINEPHrine  0.3 mg/0.3 mL IJ SOAJ injection Inject 0.3 mg into the muscle as needed (anaphylaxis). 2 each 1   famotidine  (PEPCID ) 40 MG tablet TAKE 1 TABLET AT BEDTIME (KEEP UPCOMING APPOINTMENT FOR FURTHER REFILLS) (Patient not taking: Reported on 11/13/2023) 30 tablet 5   FLUAD QUADRIVALENT 0.5 ML injection Inject 0.5 mLs into the muscle once. (Patient not taking: Reported on 11/13/2023)     fluticasone  (FLONASE ) 50 MCG/ACT nasal spray Place 1 spray into both nostrils daily. 48 g 1   furosemide  (LASIX ) 40 MG tablet Take 1 tablet (40 mg) daily and an extra half tablet (20 mg) daily as needed. 135 tablet 1   hydrALAZINE  (APRESOLINE ) 25 MG tablet Take 0.5 tablets (12.5 mg total) by mouth 3 (three) times daily as needed (12.5 mg up to three times daily as needed for Systolic BP greater than 160). 270 tablet 1   ipratropium-albuterol  (DUONEB) 0.5-2.5 (3) MG/3ML SOLN Take 3 mLs by nebulization every 6 (six) hours as needed (wheezing, shortness of breath). 150 mL 1   isosorbide  mononitrate (IMDUR ) 30 MG 24 hr tablet Take 1 tablet (30  mg total) by mouth daily. 90 tablet 1   levocetirizine (XYZAL ) 5 MG tablet Take 1 tablet (5 mg total) by mouth daily as needed for allergies (Can take an extra dose during flare ups.). 180 tablet 1   metoprolol  succinate (TOPROL -XL) 50 MG 24 hr tablet Take 1 tablet (50 mg total) by mouth at bedtime. Take with or immediately following a meal. 90 tablet 1   montelukast  (SINGULAIR ) 10 MG tablet Take 1 tablet (10 mg total) by mouth at bedtime. 90 tablet 1   omeprazole  (PRILOSEC) 40 MG capsule Take 1 capsule (40 mg total) by mouth daily. 30 capsule 5   Probiotic Product (PROBIOTIC DAILY PO) Take 1 capsule by mouth daily.     Respiratory Therapy Supplies (NEBULIZER MASK ADULT) MISC 1 Device by Does not apply route as directed. 1 each 1   simvastatin  (ZOCOR ) 40 MG tablet Take 1 tablet (40 mg total) by mouth daily. 90 tablet 1   Spacer/Aero-Holding Chambers (AEROCHAMBER MV) inhaler 1 each by Other route daily. Use as instructed 1 each 1   SPIKEVAX injection Inject 0.5 mLs into the muscle once.     Facility-Administered Medications Prior to Visit  Medication Dose Route Frequency Provider Last Rate Last Admin   Benralizumab  SOSY 30 mg  30 mg Subcutaneous Q8 weeks Jeneal Danita Macintosh, MD   30 mg at 11/13/23 9065    Review of Systems  Constitutional:  Negative for chills, fever, malaise/fatigue and weight loss.  HENT:  Negative for congestion, sinus pain and sore throat.   Eyes: Negative.   Respiratory:  Positive for shortness of breath. Negative for cough, hemoptysis, sputum production and wheezing.   Cardiovascular:  Negative for chest pain, palpitations, orthopnea, claudication and leg swelling.  Gastrointestinal:  Negative for abdominal pain, heartburn, nausea and vomiting.  Genitourinary: Negative.   Musculoskeletal:  Negative for joint  pain and myalgias.  Skin:  Negative for rash.  Neurological:  Negative for weakness.  Endo/Heme/Allergies: Negative.   Psychiatric/Behavioral: Negative.      Objective:   Vitals:   12/18/23 1305 12/18/23 1310  BP: 125/72 125/72  Pulse: 66 65  SpO2: 95%   Height: 5' 5 (1.651 m)    Physical Exam Constitutional:      General: She is not in acute distress.    Appearance: Normal appearance.  Eyes:     General: No scleral icterus.    Conjunctiva/sclera: Conjunctivae normal.  Cardiovascular:     Rate and Rhythm: Normal rate and regular rhythm.     Heart sounds: Murmur heard.  Pulmonary:     Breath sounds: No wheezing, rhonchi or rales.  Musculoskeletal:     Right lower leg: No edema.     Left lower leg: No edema.  Skin:    General: Skin is warm and dry.  Neurological:     General: No focal deficit present.     CBC    Component Value Date/Time   WBC 4.4 07/19/2023 1235   WBC 4.6 09/01/2021 0840   RBC 4.31 07/19/2023 1235   RBC 4.50 09/01/2021 0840   HGB 12.9 07/19/2023 1235   HCT 40.1 07/19/2023 1235   PLT 229 07/19/2023 1235   MCV 93 07/19/2023 1235   MCH 29.9 07/19/2023 1235   MCH 29.1 09/01/2021 0840   MCHC 32.2 07/19/2023 1235   MCHC 32.3 09/01/2021 0840   RDW 12.8 07/19/2023 1235   LYMPHSABS 2.3 08/31/2021 2308   MONOABS 0.6 08/31/2021 2308   EOSABS 0.0 08/31/2021 2308   BASOSABS 0.0 08/31/2021 2308      Latest Ref Rng & Units 05/22/2023    3:38 PM 03/14/2023   10:15 AM 10/03/2022   11:05 AM  BMP  Glucose 70 - 99 mg/dL 88  861  838   BUN 8 - 27 mg/dL 17  18  17    Creatinine 0.57 - 1.00 mg/dL 8.80  8.71  8.75   BUN/Creat Ratio 12 - 28 14  14  14    Sodium 134 - 144 mmol/L 139  137  142   Potassium 3.5 - 5.2 mmol/L 4.2  4.1  4.2   Chloride 96 - 106 mmol/L 100  100  102   CO2 20 - 29 mmol/L 27  26  23    Calcium  8.7 - 10.3 mg/dL 8.9  8.8  8.8    Chest imaging: CXR 09/18/23 - Personal review No pleural effusions or pneumothorax. Clear lung fields, no opacities or infiltrates.   PFT:     No data to display          Labs:  Path:  Echo 06/21/23: LV EF 70-75% with hyperdynamic function. RV size and  systolic function are normal. Aortic valve is calcified with moderate thickening - moderate aortic stenosis VTI 0.85cm.  Heart Catheterization:    Assessment & Plan:   Eosinophilic asthma  Diffusion capacity of lung (dl), decreased - Plan: CT CHEST HIGH RESOLUTION  Discussion: Megan Hull is an 83 year old woman, former smoker with CHF, DMII, GERD and hypertension who returns to pulmonary clinic for asthma and dyspnea.  Shortness of breath Multifactorial in setting of asthma, aortic stenosis and mild diffusion defect on PFTs. - Check HRCT to rule out ILD - ECHO monitored by cardiology  Severe asthma Managed with Fasenra . Minimal breztri  use due to hoarseness of voice side effects. - Continue Fasenra  injections and albuterol  as  needed. - try low dose symbicort  2 puffs twice daily - stop breztri  inhaler  Aortic stenosis Progressed to mild-to-moderate. Monitored by cardiologist. Not severe enough for intervention. - Continue monitoring by cardiologist.  Follow up in 3 months to review CT Chest scan  Dorn Chill, MD [facility] Office: [phone]    Current Outpatient Medications:    albuterol  (VENTOLIN  HFA) 108 (90 Base) MCG/ACT inhaler, Inhale 2 puffs into the lungs every 4 (four) hours as needed for wheezing or shortness of breath., Disp: 18 g, Rfl: 1   amiodarone  (PACERONE ) 200 MG tablet, TAKE 1 TABLET EVERY DAY, Disp: 90 tablet, Rfl: 3   aspirin  EC 81 MG tablet, Take 1 tablet (81 mg total) by mouth daily. Swallow whole., Disp: 90 tablet, Rfl: 3   benralizumab  (FASENRA ) 30 MG/ML prefilled syringe, Inject 1 mL (30 mg total) into the skin every 8 (eight) weeks., Disp: 1 mL, Rfl: 6   Budeson-Glycopyrrol-Formoterol  (BREZTRI  AEROSPHERE) 160-9-4.8 MCG/ACT AERO, Inhale 2 puffs into the lungs in the morning and at bedtime. With spacer, Disp: 32.1 g, Rfl: 1   cetirizine  (ZYRTEC ) 10 MG tablet, TAKE 1 TABLET 2 (TWO) TIMES DAILY AS NEEDED FOR ALLERGIES  (CAN USE AN EXTRA DOSE DURING FLARE UPS.), Disp: 180 tablet, Rfl: 3   EPINEPHrine  0.3 mg/0.3 mL IJ SOAJ injection, Inject 0.3 mg into the muscle as needed (anaphylaxis)., Disp: 2 each, Rfl: 1   famotidine  (PEPCID ) 40 MG tablet, TAKE 1 TABLET AT BEDTIME (KEEP UPCOMING APPOINTMENT FOR FURTHER REFILLS) (Patient not taking: Reported on 11/13/2023), Disp: 30 tablet, Rfl: 5   FLUAD QUADRIVALENT 0.5 ML injection, Inject 0.5 mLs into the muscle once. (Patient not taking: Reported on 11/13/2023), Disp: , Rfl:    fluticasone  (FLONASE ) 50 MCG/ACT nasal spray, Place 1 spray into both nostrils daily., Disp: 48 g, Rfl: 1   furosemide  (LASIX ) 40 MG tablet, Take 1 tablet (40 mg) daily and an extra half tablet (20 mg) daily as needed., Disp: 135 tablet, Rfl: 1   hydrALAZINE  (APRESOLINE ) 25 MG tablet, Take 0.5 tablets (12.5 mg total) by mouth 3 (three) times daily as needed (12.5 mg up to three times daily as needed for Systolic BP greater than 160)., Disp: 270 tablet, Rfl: 1   ipratropium-albuterol  (DUONEB) 0.5-2.5 (3) MG/3ML SOLN, Take 3 mLs by nebulization every 6 (six) hours as needed (wheezing, shortness of breath)., Disp: 150 mL, Rfl: 1   isosorbide  mononitrate (IMDUR ) 30 MG 24 hr tablet, Take 1 tablet (30 mg total) by mouth daily., Disp: 90 tablet, Rfl: 1   levocetirizine (XYZAL ) 5 MG tablet, Take 1 tablet (5 mg total) by mouth daily as needed for allergies (Can take an extra dose during flare ups.)., Disp: 180 tablet, Rfl: 1   metoprolol  succinate (TOPROL -XL) 50 MG 24 hr tablet, Take 1 tablet (50 mg total) by mouth at bedtime. Take with or immediately following a meal., Disp: 90 tablet, Rfl: 1   montelukast  (SINGULAIR ) 10 MG tablet, Take 1 tablet (10 mg total) by mouth at bedtime., Disp: 90 tablet, Rfl: 1   omeprazole  (PRILOSEC) 40 MG capsule, Take 1 capsule (40 mg total) by mouth daily., Disp: 30 capsule, Rfl: 5   Probiotic Product (PROBIOTIC DAILY PO), Take 1 capsule by mouth daily., Disp: , Rfl:    Respiratory  Therapy Supplies (NEBULIZER MASK ADULT) MISC, 1 Device by Does not apply route as directed., Disp: 1 each, Rfl: 1   simvastatin  (ZOCOR ) 40 MG tablet, Take 1 tablet (40 mg total) by  mouth daily., Disp: 90 tablet, Rfl: 1   Spacer/Aero-Holding Chambers (AEROCHAMBER MV) inhaler, 1 each by Other route daily. Use as instructed, Disp: 1 each, Rfl: 1   SPIKEVAX injection, Inject 0.5 mLs into the muscle once., Disp: , Rfl:   Current Facility-Administered Medications:    Benralizumab  SOSY 30 mg, 30 mg, Subcutaneous, Q8 weeks, Jeneal Danita Macintosh, MD, 30 mg at 11/13/23 [phone]

## 2023-12-18 NOTE — Progress Notes (Signed)
 Full pft performed today.

## 2023-12-26 DIAGNOSIS — Z91014 Allergy to mammalian meats: Secondary | ICD-10-CM | POA: Diagnosis not present

## 2023-12-26 DIAGNOSIS — Z91038 Other insect allergy status: Secondary | ICD-10-CM | POA: Diagnosis not present

## 2024-01-02 ENCOUNTER — Ambulatory Visit (HOSPITAL_COMMUNITY)
Admission: RE | Admit: 2024-01-02 | Discharge: 2024-01-02 | Disposition: A | Discharge status: Home / Self Care | Source: Ambulatory Visit | Attending: Pulmonary Disease | Admitting: Pulmonary Disease

## 2024-01-02 DIAGNOSIS — R942 Abnormal results of pulmonary function studies: Secondary | ICD-10-CM | POA: Diagnosis not present

## 2024-01-02 DIAGNOSIS — J984 Other disorders of lung: Secondary | ICD-10-CM | POA: Diagnosis not present

## 2024-01-02 DIAGNOSIS — I700 Atherosclerosis of aorta: Secondary | ICD-10-CM | POA: Diagnosis not present

## 2024-01-08 ENCOUNTER — Encounter: Payer: Self-pay | Admitting: Podiatry

## 2024-01-08 ENCOUNTER — Ambulatory Visit (INDEPENDENT_AMBULATORY_CARE_PROVIDER_SITE_OTHER): Admitting: Podiatry

## 2024-01-08 ENCOUNTER — Ambulatory Visit (INDEPENDENT_AMBULATORY_CARE_PROVIDER_SITE_OTHER)

## 2024-01-08 DIAGNOSIS — B351 Tinea unguium: Secondary | ICD-10-CM | POA: Diagnosis not present

## 2024-01-08 DIAGNOSIS — J4550 Severe persistent asthma, uncomplicated: Secondary | ICD-10-CM | POA: Diagnosis not present

## 2024-01-08 DIAGNOSIS — M79675 Pain in left toe(s): Secondary | ICD-10-CM | POA: Diagnosis not present

## 2024-01-08 DIAGNOSIS — M79674 Pain in right toe(s): Secondary | ICD-10-CM | POA: Diagnosis not present

## 2024-01-08 DIAGNOSIS — E1151 Type 2 diabetes mellitus with diabetic peripheral angiopathy without gangrene: Secondary | ICD-10-CM

## 2024-01-08 NOTE — Progress Notes (Signed)
 Subjective:  Patient ID: Megan Hull, female    DOB: [birth year],  MRN: [id number]  Megan Hull presents to clinic today for:  Chief Complaint  Patient presents with   Diabetes    Trim my toenails.  My right toenail has been bothering me. (Medial border)  I want to know if you have anymore of the plastic dividers.  Mine have worn out.  They helped my toe.  Saw Dr. Lonell Collet - 08/07/2023; A1c - 6.7   Patient notes nails are thick and elongated, causing pain in shoe gear when ambulating.  As she is requesting replacement pads and gel sleeves for the toes.  States her toes have been more comfortable since wearing them.  She is requesting diabetic shoes through [facility].  PCP is Collet Lonell, DO.  Last seen around 10/02/2023  Past Medical History:  Diagnosis Date   Arthritis    Asthma    CHF (congestive heart failure) (HCC)    Diabetes mellitus without complication (HCC)    borderline   Diarrhea    Diverticulosis    Dysrhythmia    controlled with Metoprolol -12-19-13 LOV -Dr. Court sees yearly   Family history of premature CAD    GERD (gastroesophageal reflux disease)    Heart murmur    yrs ago   HLD (hyperlipidemia)    pt reports cholesterol normal now (07/21/19).   Hypertension    Lower extremity edema    Status post dilation of esophageal narrowing    Supraventricular arrhythmia    Tobacco abuse    Allergies  Allergen Reactions   Nexium  [Esomeprazole  Magnesium ] Anaphylaxis    Pt states she was unable to swallow for several hours after taking the nexium    Ace Inhibitors Swelling    Angioedema    Codeine Other (See Comments)    REACTION: chest pain   Monosodium Glutamate Other (See Comments)    dizziness   Other Hives, Swelling and Other (See Comments)    Red Fish (Hives) Bojangle's chicken (swelling and shortness of breath) PAPRIKA   Amlodipine  Other (See Comments)    Lower extremity swelling, itching   Latex Rash    Causes skin to turn red  per patient    Shellfish Allergy Hives   Tape Rash    Coban wrap turned her arm red (after stress test)    Objective:  Megan Hull is a pleasant 83 y.o. female in NAD. AAO x 3.  Vascular Examination: Patient has palpable DP pulse, absent PT pulse bilateral.  Delayed capillary refill bilateral toes.  Sparse digital hair bilateral.  Proximal to distal cooling WNL bilateral.    Dermatological Examination: Interspaces are clear with no open lesions noted bilateral.  Skin is shiny and atrophic bilateral.  Nails are 3-80mm thick, with yellowish/brown discoloration, subungual debris and distal onycholysis x10.  There is pain with compression of nails x10.    Patient qualifies for at-risk foot care because of diabetes with PVD.  Assessment/Plan: 1. Pain due to onychomycosis of toenails of both feet   2. Type II diabetes mellitus with peripheral circulatory disorder (HCC)    Mycotic nails x10 were sharply debrided with sterile nail nippers and power debriding burr to decrease bulk and length.  Her single loop gel toe spacer and gel sleeves were dispensed today.  She likes these and states that it is really helping to keep her toes more comfortable.  She also requested prescription for diabetic shoes with diabetic insoles.  Will get this faxed over to [facility] along with today's notes.  She does qualify for the shoes due to diabetes with PVD and hallux valgus  Return in about 3 months (around 04/09/2024) for [facility].   Awanda CHARM Imperial, DPM, FACFAS [facility]     [address] [place], [place] [address]                Office [phone]  Fax [phone]

## 2024-01-13 ENCOUNTER — Ambulatory Visit: Payer: Self-pay | Admitting: Pulmonary Disease

## 2024-01-14 ENCOUNTER — Other Ambulatory Visit: Payer: Self-pay | Admitting: Pulmonary Disease

## 2024-01-14 ENCOUNTER — Telehealth: Payer: Self-pay | Admitting: Pulmonary Disease

## 2024-01-14 DIAGNOSIS — J8283 Eosinophilic asthma: Secondary | ICD-10-CM

## 2024-01-14 NOTE — Telephone Encounter (Signed)
 Copied from CRM [id number]. Topic: Clinical - Medication Refill >> Jan 14, 2024  2:01 PM Dustin F wrote: Medication: budesonide -formoterol  (SYMBICORT ) 80-4.5 MCG/ACT inhaler   Has the patient contacted their pharmacy? Yes (Agent: If no, request that the patient contact the pharmacy for the refill. If patient does not wish to contact the pharmacy document the reason why and proceed with request.) (Agent: If yes, when and what did the pharmacy advise?)  This is the patient's preferred pharmacy:  [facility] [address], [place] - [address] [place] [address] Phone: [phone] Fax: [phone]   Is this the correct pharmacy for this prescription? Yes If no, delete pharmacy and type the correct one.   Has the prescription been filled recently? Yes  Is the patient out of the medication? No  Has the patient been seen for an appointment in the last year OR does the patient have an upcoming appointment? Yes  Can we respond through MyChart? No  Agent: Please be advised that Rx refills may take up to 3 business days. We ask that you follow-up with your pharmacy.

## 2024-01-14 NOTE — Telephone Encounter (Unsigned)
 Copied from CRM #[id number]. Topic: Clinical - Medication Refill >> Jan 14, 2024 12:48 PM Isabell A wrote: Medication: budesonide -formoterol  (SYMBICORT ) 80-4.5 MCG/ACT inhaler  Has the patient contacted their pharmacy? No (Agent: If no, request that the patient contact the pharmacy for the refill. If patient does not wish to contact the pharmacy document the reason why and proceed with request.) (Agent: If yes, when and what did the pharmacy advise?)  This is the patient's preferred pharmacy:  [facility] - [place], [place] - [address] [place] [address] Phone: [phone] Fax: [phone]  Is this the correct pharmacy for this prescription? Yes If no, delete pharmacy and type the correct one.   Has the prescription been filled recently? Yes  Is the patient out of the medication? No  Has the patient been seen for an appointment in the last year OR does the patient have an upcoming appointment? Yes  Can we respond through MyChart? No  Agent: Please be advised that Rx refills may take up to 3 business days. We ask that you follow-up with your pharmacy.

## 2024-01-15 MED ORDER — BUDESONIDE-FORMOTEROL FUMARATE 80-4.5 MCG/ACT IN AERO: 2.0000 | INHALATION_SPRAY | Freq: Two times a day (BID) | RESPIRATORY_TRACT | 3 refills | Status: DC

## 2024-01-15 MED ORDER — BUDESONIDE-FORMOTEROL FUMARATE 80-4.5 MCG/ACT IN AERO
2.0000 | INHALATION_SPRAY | Freq: Two times a day (BID) | RESPIRATORY_TRACT | 11 refills | Status: DC
Start: 2024-01-15 — End: 2024-02-19

## 2024-01-15 NOTE — Telephone Encounter (Signed)
 Called and spoke with the pt and notified rx sent to [facility]  She states she actually wanted it to go to [facility]  I called [facility] and cancelled the rx  I have sent to [facility]  Nothing further needed

## 2024-01-16 NOTE — Progress Notes (Unsigned)
 Cardiology Clinic Note   Patient Name: Megan Hull Date of Encounter: 01/17/2024  Primary Care Provider:  Gerome Brunet, DO Primary Cardiologist:  Dorn Lesches, MD  Patient Profile    Megan Hull 83 year old female presents to the clinic today for follow-up evaluation of her  Chronic diastolic CHF and aortic valve stenosis  Past Medical History    Past Medical History:  Diagnosis Date   Arthritis    Asthma    CHF (congestive heart failure) (HCC)    Diabetes mellitus without complication (HCC)    borderline   Diarrhea    Diverticulosis    Dysrhythmia    controlled with Metoprolol -12-19-13 LOV -Dr. Lesches sees yearly   Family history of premature CAD    GERD (gastroesophageal reflux disease)    Heart murmur    yrs ago   HLD (hyperlipidemia)    pt reports cholesterol normal now (07/21/19).   Hypertension    Lower extremity edema    Status post dilation of esophageal narrowing    Supraventricular arrhythmia    Tobacco abuse    Past Surgical History:  Procedure Laterality Date   ANTERIOR CERVICAL DECOMP/DISCECTOMY FUSION N/A 06/08/2015   Procedure: CERVICAL FIVE-SIX ,CERVICAL SIX-SEVEN ANTERIOR CERVICAL DECOMPRESSION/DISCECTOMY FUSION PLATING BONEGRAFT;  Surgeon: Victory Gens, MD;  Location: [facility];  Service: Neurosurgery;  Laterality: N/A;   CARDIAC CATHETERIZATION  01/15/2009   non-critical CAD, 60% mid LAD lesion (Dr. DOROTHA Lesches)   CATARACT EXTRACTION, BILATERAL     most recent 11/19   COLONOSCOPY     ESOPHAGEAL DILATION  2014   infected lymph node surgery  80's   NM MYOCAR PERF WALL MOTION  04/2011   bruce myoview ; normal pattern of perfusion, EF 80%, low risk scan   SINUS ENDO WITH FUSION Bilateral 03/04/2020   Procedure: SINUS ENDOSCOPY WITH FUSION NAVIGATION;  Surgeon: Mable Lenis, MD;  Location: [facility];  Service: ENT;  Laterality: Bilateral;   TONSILLECTOMY  70's   TRANSTHORACIC ECHOCARDIOGRAM  12/2007   mild conc  LVH   TURBINATE REDUCTION Bilateral 03/04/2020   Procedure: TURBINATE REDUCTION;  Surgeon: Mable Lenis, MD;  Location: [facility];  Service: ENT;  Laterality: Bilateral;    Allergies  Allergies  Allergen Reactions   Nexium  [Esomeprazole  Magnesium ] Anaphylaxis    Pt states she was unable to swallow for several hours after taking the nexium    Ace Inhibitors Swelling    Angioedema    Codeine Other (See Comments)    REACTION: chest pain   Monosodium Glutamate Other (See Comments)    dizziness   Other Hives, Swelling and Other (See Comments)    Red Fish (Hives) Bojangle's chicken (swelling and shortness of breath) PAPRIKA   Amlodipine  Other (See Comments)    Lower extremity swelling, itching   Latex Rash    Causes skin to turn red per patient    Shellfish Allergy Hives   Tape Rash    Coban wrap turned her arm red (after stress test)    History of Present Illness    Megan Hull has a PMH of essential hypertension, coronary artery disease, chronic diastolic CHF, SVT, esophageal reflux, DJD, dyslipidemia, lower extremity numbness, lower extremity edema, dyspnea, and left foot drop.  Cardiac catheterization 812 showed 60% mid LAD narrowing.  Angioedema secondary to ACE inhibitor.     She was seen by Dr. Lesches 12/20.  An echocardiogram 12/19 and nuclear stress test 1/20 after her complaints of neck pain and  dyspnea on exertion showed low risk.  Her echocardiogram showed an LVEF of 60-65%, mild LVH and G1 DD.  She was also evaluated by pulmonary and was not felt to have contributed COPD.   When she last presented to the clinic on 07/30/2019 she brought blood pressure readings that showed consistent elevated systolic blood pressure 140-160 systolic.  She brought her blood pressure monitor from home which showed an increase in systolic pressure by around 40 points.  Her blood pressure in the office was 120s over 60s.  She was grateful to discover this.  She was doing well  from a cardiac standpoint.  She continued to have chronic dyspnea on exertion she had been prescribed albuterol  however had not used the medication.  She had not had any recurrent episodes of PSVT.   She presented to the clinic 12/03/2019 for follow-up evaluation and stated she felt well.  She stated that she  had episodes where her blood pressure was in the 115's over 60s and 70s and she had taken her hydralazine  which further dropped down her blood pressure and maked her feel not well.  She requested that she have 25 mg tablets to be able to take in the evening when her blood pressure is lower.  She continuee to be very physically active in her church and followed a heart healthy diet.  She brought a blood pressure log  that showed well-controlled blood pressure.  She stated that she had an appointment with Dr. Court in August for clearance for her sinus surgery.  I  gave her a prescription for an additional 25 mg of hydralazine , gave her the salty 6 diet sheet, and had her follow-up as scheduled.   She was seen by Dr. Court 02/10/2020 in follow-up.  She continued to do well.  She did complain of allergies and sinus drainage.  Her sinus surgery was discussed and deemed to be a safe procedure from a cardiac standpoint.  Her blood pressure was well managed at that time.   She presented to the clinic 08/09/2020 for follow-up evaluation and stated around 1 month ago she had asthma exacerbation that required nebulizer treatments and steroids.  She reported that the treatment greatly improved her breathing.  However currently she was still working to get over her exacerbation.  She was taking nebulizer treatments when she noticed wheezing.  She was managing her blood pressure with her hydralazine .  Her carvedilol  was held by her PCP and she was taking 12.5 mg when her heart rate increased to 95.  I agreed with the change at the current time.  I  had her continue her current medication regimen.  She reported that she  was going to [place]  for the entire month of May and she would not be available for follow-up until the beginning of June.  I  gave her the salty 6 diet sheet, had her maintain her blood pressure log and follow-up early June.  Planned to repeat her fasting lipid panel at that time.   She presented to the [facility] emergency department 08/28/2020 with complaints of lower extremity swelling.  Her blood pressure was 140/74 with pulse of 67.  Her furosemide  was increased to 40 mg twice daily x2 days.  She was then instructed to resume her 40 mg morning and 20 mg nightly dosing.  Lower extremity compression stockings, low-salt diet were reviewed.  She was given 40 mg IV Lasix  x1.  CBC/CMP/troponins unremarkable.  She reported that her symptoms had improved and  her neck pain had resolved after IV diuresis, lidocaine  patch, and Tylenol .  She was discharged in stable condition.   She presented to the clinic 11/17/2020 for follow-up evaluation stated she was having more difficulty with routine daily activities.  She reported that she noticed increased shortness of breath with increased physical activity.  Her weight was up about 3 pounds from her dry weight.  She felt that she was not urinating as much with her furosemide .  She continued to follow a heart healthy low-sodium diet, wear her support stockings, and limit her fluid intake.  I will prescribe torsemide  20 mg in a.m. and 10 mg in the p.m.   Her furosemide  was stopped.  I  ordered a BMP in 1 week.   She contacted the nurse triage line on 03/01/2021.  She reported having COVID on 8/23.  During the time of the call, she reported low blood pressures with systolic pressures ranging in the 109-112 range with heart rates in the 50s.  I instructed her to decrease her carvedilol  to 6.25 mg daily, continue her Imdur , and only take hydralazine  for systolic blood pressure greater than 150.   She presented the clinic 03/08/2021 for follow-up evaluation stated she has noticed that  her blood pressure was  higher in the mornings.  Her blood pressure log shows blood pressures starting now in the low 100s and increasing over the weekend to the mid 140s and occasionally in the 160s.  She states that she can tell when her blood pressure is elevated due to a hot left year.  We will increase her carvedilol  to 12.5 mg in the morning and continue her 6.25 mg afternoon/evening dose.    She was seen in follow-up by Dr. Court on 11/16/2021.  She was stable from a cardiac standpoint at that time.  She had been hospitalized in March due to questionable TIA.  She was seen by EP for palpitations.  She wore a cardiac event monitor which showed frequent PVCs with a 20% burden.  She was started on mexiletine and did not tolerate the medication.  Mexiletine was discontinued and she was placed on amiodarone .  She was seen in follow-up by Riner C PA-C on 01/18/2022.  She was doing well at that time.  She reported new and frequent stuttering breath.  She denied shortness of breath.  It was not felt that her symptoms were related to amiodarone  or cardiac in nature.  She was advised to follow-up with her PCP.  She denied cough, shortness of breath, dyspnea and chest pain.    She presented to the clinic 07/14/22 for follow-up evaluation and stated she occasionally noticed elevated blood pressures in the mornings.  She noticed her blood pressures were more elevated when she retained more fluid.  She was instructed by her PCP to only take 40 mg of furosemide  daily due to her increased creatinine.  We reviewed the importance of balancing hydration and medication.  She expressed understanding.  Her blood pressure initially was 144/62 and on recheck was 134/82.  I  changed her hydralazine  prescription to 12.5 mg 3 times daily as needed for systolic blood pressure greater than 160.  I asked her to maintain her blood pressure log.  She reported that she was traveling to [facility] to stay during [employer] time.  She was  excited about the trip.  We reviewed her echocardiogram.  She expressed understanding.  I  planned follow-up in 6 months, I refilled her amiodarone .  She  was seen in follow-up by Jodie Passey, PA-C on 10-24.  Her TSH was normal.  Her free T4 was slightly elevated at 1.85.  Her CMP showed creatinine of 1.28 and EGFR of 42.  She was stable from a cardiac standpoint.  She did note mild shortness of breath after walking 1.5 blocks.  She was exercising every other day and was active in church.  She was able to walk her basement stairs without difficulty.  She denied chest pain orthopnea and PND.  Follow-up was planned for 6 months.  She contacted the nurse triage line on 05/12/2023.  She reported a weight gain from 165 pounds to 181 pounds.  She noted compliance with her lower extremity compression stockings.  She was taking an extra furosemide  (20 mg) every other day.  She inquired about taking a GLP-1 medication such as Ozempic for her weight.  I increased her furosemide  to 80 mg x 3 days and added 20 mill equivalents of potassium x 3 days.  Office visit was scheduled.  She presented to the clinic 05/22/23 for follow-up evaluation and stated she felt that over the last 2 to 3 years her weight had steadily increased.  She noted that she was no longer able to wear some of her close.  Her weight was 1 pound greater than it was in October 2024.  We reviewed her increase in fluid volume and 3-day diuresis.  She reported compliance with her medications and low-sodium diet.  She was staying physically active working with her church.  We discussed GLP-1 medication.  At that time I continued her current medication, repeated her echocardiogram, asked her to increase her physical activity, and planned follow-up after echocardiogram.  Echocardiogram 06/21/2023 showed LVEF of 70-75%, intermediate diastolic parameters, elevated LVEDP, trivial mitral valve regurgitation and trivial aortic valve regurgitation with moderate  aortic valve stenosis  She presented to the clinic 07/19/23 for follow-up evaluation and stated she continued to be challenged by weight gain.  Her weight was 186 pounds.  She appeared to be euvolemic.  She reported taking allergy injections every 6 weeks.  She was somewhat active working at [employer], walking and doing balance and stretching type classes.  We reviewed her echocardiogram.  She expressed understanding.  I encouraged resistance type activities.  I ordered a CBC, referred for for diet and resistance training instruction, continued her  medication regimen and planned follow-up in 6 months.  I will also referred to pulmonology for PFTs due to her increased DOE.  She remained stable when she followed up with EP 08/23/2023.  She continued to have chronic DOE.  She followed up with pulmonology 4/25.  She was restarted on Symbicort .  She repeated her PFTs.  She is following with [facility] for alpha gal and allergy.  She presents to the clinic today for follow-up evaluation and states she was seen and evaluated by pulmonology.  She was also seen by [facility] for alpha gal.  She had follow-up chest CT which has not yet been reviewed.  Her PFTs were fairly normal.  She has lost around 4 pounds.  She continues to note increased work of breathing with increased physical activity.  We reviewed her January echocardiogram.  She was noted to have moderate aortic valve stenosis.  I will refer her to structural heart for further workup and management.  She notes that she is getting ready to do a fishing trip towards the end of August until the beginning of September.  She wishes to schedule  with Dr. Verlin around that time.  I will continue her current medication regimen and plan follow-up with general cardiology in 6 months.   Today she denies chest pain, shortness of breath, lower extremity edema, fatigue, palpitations, melena, hematuria, hemoptysis, diaphoresis, weakness, presyncope, syncope, orthopnea, and  PND.    Home Medications    Prior to Admission medications   Medication Sig Start Date End Date Taking? Authorizing Provider  albuterol  (VENTOLIN  HFA) 108 (90 Base) MCG/ACT inhaler Inhale 2 puffs into the lungs every 4 (four) hours as needed for wheezing or shortness of breath. 05/16/22   Kozlow, Eric J, MD  Alum & Mag Hydroxide-Simeth (ANTACID ADVANCED PO) Take 1 tablet by mouth daily as needed (indigestion).     [provider]  amiodarone  (PACERONE ) 200 MG tablet Take 1 tablet (200 mg total) by mouth daily. 11/25/21   Lesia Ozell Barter, PA-C  aspirin  EC 81 MG tablet Take 1 tablet (81 mg total) by mouth daily. Swallow whole. 09/03/20   Emelia Josefa HERO, NP  Budeson-Glycopyrrol-Formoterol  (BREZTRI  AEROSPHERE) 160-9-4.8 MCG/ACT AERO Inhale 2 puffs into the lungs in the morning and at bedtime. 05/16/22   Kozlow, Camellia PARAS, MD  budesonide  (PULMICORT ) 0.25 MG/2ML nebulizer solution Take 2 mLs (0.25 mg total) by nebulization 2 (two) times daily as needed (shortness of breath, wheezing). 01/17/22   Kozlow, Camellia PARAS, MD  cetirizine  (ZYRTEC ) 10 MG tablet Take 10 mg by mouth daily.    [provider]  EPINEPHrine  0.3 mg/0.3 mL IJ SOAJ injection Inject 0.3 mg into the muscle as needed (anaphylaxis). 05/16/22   Kozlow, Camellia PARAS, MD  famotidine  (PEPCID ) 40 MG tablet Take 1 tablet (40 mg total) by mouth at bedtime. 01/17/22   Kozlow, Camellia PARAS, MD  FLUAD QUADRIVALENT 0.5 ML injection Inject 0.5 mLs into the muscle once. 03/21/22   [provider]  fluticasone  (FLONASE ) 50 MCG/ACT nasal spray Place 1 spray into both nostrils daily. 05/16/22   Kozlow, Camellia PARAS, MD  furosemide  (LASIX ) 40 MG tablet Take 1.5 tablets (60 mg total) by mouth daily. 11/16/21   Court Dorn PARAS, MD  hydrALAZINE  (APRESOLINE ) 25 MG tablet TAKE 1 TABLET THREE TIMES DAILY AS NEEDED FOR SYSTOLIC BLOOD PRESSURE GREATER THAN 150 (NEED MD APPOINTMENT) 05/03/22   Court Dorn PARAS, MD  ipratropium-albuterol  (DUONEB) 0.5-2.5 (3)  MG/3ML SOLN Take 3 mLs by nebulization every 6 (six) hours as needed (wheezing, shortness of breath). 05/16/22   Kozlow, Camellia PARAS, MD  isosorbide  mononitrate (IMDUR ) 30 MG 24 hr tablet Take 1 tablet (30 mg total) by mouth daily. 11/16/21   Court Dorn PARAS, MD  metoprolol  succinate (TOPROL -XL) 50 MG 24 hr tablet Take 1 tablet (50 mg total) by mouth at bedtime. Take with or immediately following a meal. 11/16/21   Court Dorn PARAS, MD  montelukast  (SINGULAIR ) 10 MG tablet Take 1 tablet (10 mg total) by mouth at bedtime. 05/16/22   Kozlow, Eric J, MD  Multiple Vitamins-Minerals (AIRBORNE PO) Take 1 tablet by mouth daily.    [provider]  omeprazole  (PRILOSEC) 40 MG capsule Take 1 capsule (40 mg total) by mouth every morning. 05/16/22   Kozlow, Eric J, MD  Probiotic Product (PROBIOTIC DAILY PO) Take 1 capsule by mouth daily.    [provider]  simvastatin  (ZOCOR ) 40 MG tablet TAKE 1 TABLET EVERY DAY 04/28/22   Court Dorn PARAS, MD  Spacer/Aero-Holding Chambers (AEROCHAMBER MV) inhaler 1 each by Other route daily. Use as instructed 09/27/21   Kozlow, Eric J,  MD  SPIKEVAX injection Inject 0.5 mLs into the muscle once. 03/31/22   [provider]    Family History    Family History  Problem Relation Age of Onset   Heart disease Brother        MI @ 68, HTN @ 93, DM @ 54    Heart disease Father        MI   Heart disease Mother        MI, CVA @ 10   Heart disease Sister        CHF, CVA   Breast cancer Sister 50   Esophageal cancer Brother    Alzheimer's disease Sister    Heart disease Brother        MI in 59s, lung cancer   Heart disease Sister    Hypertension Sister        x2   Hyperlipidemia Sister        x2   Diabetes Sister        x2   Cancer Sister    Hypertension Brother    Colon cancer Neg Hx    She indicated that her mother is deceased. She indicated that her father is deceased. She indicated that two of her ten sisters are alive. She indicated that only  one of her six brothers is alive. She indicated that her maternal grandmother is deceased. She indicated that her maternal grandfather is deceased. She indicated that her paternal grandmother is deceased. She indicated that her paternal grandfather is deceased. She indicated that the status of her neg hx is unknown.  Social History    Social History   Socioeconomic History   Marital status: Single    Spouse name: Not on file   Number of children: 0   Years of education: 12+   Highest education level: Not on file  Occupational History   Occupation: Reitred  Tobacco Use   Smoking status: Former    Current packs/day: 0.00    Average packs/day: 0.3 packs/day for 15.0 years (3.8 ttl pk-yrs)    Types: Cigarettes    Start date: 02/03/2001    Quit date: 02/04/2016    Years since quitting: 7.9    Passive exposure: Past   Smokeless tobacco: Never  Vaping Use   Vaping status: Never Used  Substance and Sexual Activity   Alcohol  use: Not Currently    Comment: occasional - tequila shots on holidays   Drug use: No   Sexual activity: Not on file  Other Topics Concern   Not on file  Social History Narrative   Lives alone    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  Cardiovascular:  No chest pain, dyspnea on exertion, edema, orthopnea, palpitations, paroxysmal nocturnal dyspnea. Dermatological: No rash, lesions/masses Respiratory: No cough, dyspnea Urologic: No hematuria, dysuria Abdominal:   No nausea, vomiting, diarrhea, bright red blood per rectum, melena, or hematemesis Neurologic:  No visual changes, wkns, changes in  mental status. All other systems reviewed and are otherwise negative except as noted above.  Physical Exam    VS:  BP (!) 146/70   Pulse 71   Ht 5' 5 (1.651 m)   Wt 182 lb (82.6 kg)   SpO2 95%   BMI 30.29 kg/m  , BMI Body mass index is 30.29 kg/m. GEN: Well nourished, well developed, in no acute distress. HEENT: normal. Neck: Supple, no JVD, carotid bruits, or masses. Cardiac: RRR, 3/6 systolic murmur heard best along right sternal border , rubs, or gallops. No clubbing, cyanosis, edema.  Radials/DP/PT 2+ and equal bilaterally.  Respiratory:  Respirations regular and unlabored, clear to auscultation bilaterally. GI: Soft, nontender, nondistended, BS + x 4. MS: no deformity or atrophy. Skin: warm and dry, no rash. Neuro:  Strength and sensation are intact. Psych: Normal affect.  Accessory Clinical Findings    Recent Labs: 03/14/2023: ALT 23 05/22/2023: BUN 17; Creatinine, Ser 1.19; Potassium 4.2; Sodium 139 07/19/2023: Hemoglobin 12.9; Platelets 229 08/07/2023: TSH 1.68   Recent Lipid Panel    Component Value Date/Time   CHOL 116 09/02/2021 0216   TRIG 61 09/02/2021 0216   HDL 41 09/02/2021 0216   CHOLHDL 2.8 09/02/2021 0216   VLDL 12 09/02/2021 0216   LDLCALC 63 09/02/2021 0216       ECG personally reviewed by me today-none today.  Echocardiogram 07/01/2021    1. Left ventricular ejection fraction, by estimation, is 60 to 65%. The  left ventricle has normal function. The left ventricle has no regional  wall motion abnormalities. There is mild asymmetric left ventricular  hypertrophy of the septal and basal  segments. Left ventricular diastolic parameters were normal.   2. Right ventricular systolic function is normal. The right ventricular  size is normal.   3. Left atrial size was mildly dilated.   4. The mitral valve is degenerative. Trivial mitral valve regurgitation.  No evidence of mitral stenosis.   5. The aortic valve is normal in structure. There is  moderate  calcification of the aortic valve. There is moderate thickening of the  aortic valve. Aortic valve regurgitation is trivial. Mild to moderate  aortic valve stenosis.   6. The inferior vena cava is normal in size with greater than 50%  respiratory variability, suggesting right atrial pressure of 3 mmHg.    Echocardiogram 06/21/2023  IMPRESSIONS     1. Left ventricular ejection fraction, by estimation, is 70 to 75%. Left  ventricular ejection fraction by 3D volume is 73 %. The left ventricle has  hyperdynamic function. The left ventricle has no regional wall motion  abnormalities. Left ventricular  diastolic parameters are indeterminate. Elevated left ventricular  end-diastolic pressure.   2. Right ventricular systolic function is normal. The right ventricular  size is normal.   3. The mitral valve is normal in structure. Trivial mitral valve  regurgitation. No evidence of mitral stenosis.   4. The aortic valve is calcified. There is moderate calcification of the  aortic valve. There is moderate thickening of the aortic valve. Aortic  valve regurgitation is trivial. Moderate aortic valve stenosis. Aortic  valve area, by VTI measures 0.85 cm.  Aortic valve mean gradient measures 14.0 mmHg. Aortic valve Vmax measures  2.65 m/s. Although the mean AVG and V max are consistent with mild AS, the  SVI is low at 33 and DVI is low at 0.27. 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   5. The inferior vena cava is normal in size with greater than 50%  respiratory variability, suggesting right atrial pressure of 3 mmHg.  6. Compared to echo dated 06/27/2022, the mean AVG has increased form 11  to , V max has increased from 2.50m/s to 2.22m/s, AVA decreased from  1.34cm2 to 0.85cm2 (VTI) and DVI has decreased from 0.43 to 0.27.   FINDINGS   Left Ventricle: Left ventricular ejection fraction, by estimation, is 70  to 75%. Left  ventricular ejection fraction by 3D volume is 73 %. The left  ventricle has hyperdynamic function. The left ventricle has no regional  wall motion abnormalities. The  left ventricular internal cavity size was normal in size. There is no left  ventricular hypertrophy. Left ventricular diastolic parameters are  indeterminate. Elevated left ventricular end-diastolic pressure.   Right Ventricle: The right ventricular size is normal. No increase in  right ventricular wall thickness. Right ventricular systolic function is  normal.   Left Atrium: Left atrial size was normal in size.   Right Atrium: Right atrial size was normal in size.   Pericardium: There is no evidence of pericardial effusion.   Mitral Valve: The mitral valve is normal in structure. Mild mitral annular  calcification. Trivial mitral valve regurgitation. No evidence of mitral  valve stenosis.   Tricuspid Valve: The tricuspid valve is normal in structure. Tricuspid  valve regurgitation is trivial. No evidence of tricuspid stenosis.   Aortic Valve: The aortic valve is calcified. There is moderate  calcification of the aortic valve. There is moderate thickening of the  aortic valve. Aortic valve regurgitation is trivial. Moderate aortic  stenosis is present. Aortic valve mean gradient  measures 14.0 mmHg. Aortic valve peak gradient measures 28.1 mmHg. Aortic  valve area, by VTI measures 0.85 cm.   Pulmonic Valve: The pulmonic valve was normal in structure. Pulmonic valve  regurgitation is trivial. No evidence of pulmonic stenosis.   Aorta: The aortic root is normal in size and structure.   Venous: The inferior vena cava is normal in size with greater than 50%  respiratory variability, suggesting right atrial pressure of 3 mmHg.   IAS/Shunts: No atrial level shunt detected by color flow Doppler.    06/14/2018: stress myoview  Nuclear stress EF: 73%. The left ventricular ejection fraction is hyperdynamic (>65%). There  was no ST segment deviation noted during stress. The study is normal. This is a low risk study.   Normal stress nuclear study with no ischemia or infarction.  Gated ejection fraction 73% with normal wall motion.    Assessment & Plan   1.Chronic diastolic CHF-weight today 182 pounds.  She continues to be euvolemic.  Echocardiogram 06/21/23 reassuring-reviewed.  Details above.   Continue current medical therapy Continue fluid restriction-64 ounces or less daily Daily weights-contact office with a weight increase of 2 to 3 pounds overnight or 5 pounds in 1 week.   Maintain physical activity  Aortic valve stenosis-breathing stable.  Echocardiogram 1/25 showed moderate aortic valve stenosis.  Was seen and evaluated by pulmonology.  Pulmonology felt that her dyspnea was not related to pulmonary function.  She was referred back to cardiology for further review. Maintain physical activity Will refer to structural heart for consideration of TAVR valve.  Essential hypertension-BP today 146/70.   Maintain blood pressure log Continue isosorbide , metoprolol , hydralazine  Continue heart healthy low-sodium diet  Coronary artery disease-she denies exertional chest discomfort.  Her cardiac catheterization 2012 showed 60% mid LAD stenosis.  She underwent nuclear stress testing 1/20 which showed low risk and no ischemia. Continue metoprolol , Imdur , aspirin , simvastatin   Palpitations, PVCs-heart rate today 68.  Following with Dr. Inocencio.  Previously did not tolerate mexiletine and was transitioned to amiodarone .   Continue amiodarone  Avoid triggers caffeine, chocolate, EtOH etc.  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  She did require 48 hours of intubation.  Subsequent allergy testing showed allergy to ARB/ACE and red meat.  DOE/shortness of breath-weight has182 pounds.  She remains fairly physically active.  She is doing stretching and balance classes 2  days/week.  She reports that she has a well-balanced diet. Continue Symbicort  Following with pulmonology  Hyperlipidemia-reports compliance with simvastatin . Continue high-fiber diet Follows with PCP   Disposition: Follow-up with Dr.Berry or me in 6 months.   Josefa HERO. Graiden Henes NP-C     01/17/2024, 12:18 PM [facility] [address] Office [phone] Fax [phone]    I spent 15 minutes examining this patient, reviewing medications, and using patient centered shared decision making involving her cardiac care.  I spent  20 minutes reviewing her past medical history,  medications, and prior cardiac tests.

## 2024-01-17 ENCOUNTER — Ambulatory Visit: Attending: General Practice | Admitting: General Practice

## 2024-01-17 ENCOUNTER — Encounter: Payer: Self-pay | Admitting: General Practice

## 2024-01-17 VITALS — BP 146/70 | HR 71 | Ht 65.0 in | Wt 182.0 lb

## 2024-01-17 DIAGNOSIS — R0602 Shortness of breath: Secondary | ICD-10-CM | POA: Diagnosis not present

## 2024-01-17 DIAGNOSIS — R0609 Other forms of dyspnea: Secondary | ICD-10-CM | POA: Diagnosis not present

## 2024-01-17 DIAGNOSIS — Z87898 Personal history of other specified conditions: Secondary | ICD-10-CM

## 2024-01-17 DIAGNOSIS — I5032 Chronic diastolic (congestive) heart failure: Secondary | ICD-10-CM

## 2024-01-17 DIAGNOSIS — I350 Nonrheumatic aortic (valve) stenosis: Secondary | ICD-10-CM

## 2024-01-17 DIAGNOSIS — I10 Essential (primary) hypertension: Secondary | ICD-10-CM | POA: Diagnosis not present

## 2024-01-17 DIAGNOSIS — I2510 Atherosclerotic heart disease of native coronary artery without angina pectoris: Secondary | ICD-10-CM

## 2024-01-17 NOTE — Patient Instructions (Signed)
 Medication Instructions:  Your physician recommends that you continue on your current medications as directed. Please refer to the Current Medication list given to you today.  *If you need a refill on your cardiac medications before your next appointment, please call your pharmacy*  Lab Work: NONE If you have labs (blood work) drawn today and your tests are completely normal, you will receive your results only by: MyChart Message (if you have MyChart) OR A paper copy in the mail If you have any lab test that is abnormal or we need to change your treatment, we will call you to review the results.  Testing/Procedures: NONE  Follow-Up: At [facility], you and your health needs are our priority.  As part of our continuing mission to provide you with exceptional heart care, our providers are all part of one team.  This team includes your primary Cardiologist (physician) and Advanced Practice Providers or APPs (Physician Assistants and Nurse Practitioners) who all work together to provide you with the care you need, when you need it.  Your next appointment:   6 month(s)  Provider:   Josefa Beauvais, NP     We recommend signing up for the patient portal called MyChart.  Sign up information is provided on this After Visit Summary.  MyChart is used to connect with patients for Virtual Visits (Telemedicine).  Patients are able to view lab/test results, encounter notes, upcoming appointments, etc.  Non-urgent messages can be sent to your provider as well.   To learn more about what you can do with MyChart, go to [email or web address].   Other Instructions YOU HAVE BEEN REFERRED TO THE STRUCTURAL HEART CLINIC

## 2024-01-23 DIAGNOSIS — R319 Hematuria, unspecified: Secondary | ICD-10-CM | POA: Diagnosis not present

## 2024-01-23 DIAGNOSIS — N950 Postmenopausal bleeding: Secondary | ICD-10-CM | POA: Diagnosis not present

## 2024-01-23 DIAGNOSIS — N898 Other specified noninflammatory disorders of vagina: Secondary | ICD-10-CM | POA: Diagnosis not present

## 2024-01-24 ENCOUNTER — Other Ambulatory Visit: Payer: Self-pay | Admitting: General Practice

## 2024-01-24 ENCOUNTER — Other Ambulatory Visit: Payer: Self-pay | Admitting: Cardiovascular Disease

## 2024-01-24 DIAGNOSIS — I2510 Atherosclerotic heart disease of native coronary artery without angina pectoris: Secondary | ICD-10-CM

## 2024-01-24 DIAGNOSIS — E785 Hyperlipidemia, unspecified: Secondary | ICD-10-CM

## 2024-01-30 NOTE — Progress Notes (Unsigned)
 Structural Heart Clinic Consult Note  No chief complaint on file.  History of Present Illness: 83 yo female with history of DM, CAD, SVT, HLD, HTN, chronic diastolic CHF and aortic stenosis who is here today as a new consult, referred by Dr. Court, for further discussion regarding her aortic stenosis and possible TAVR. Cardiac cath in 2010 with moderate LAD stenosis. NO cardiac cath since 2010. Echo January 2025 with LVEF=70-75%. Moderate low flow/low gradient aortic stenosis with mean gradient 14 mmHg, AVA 0.85 cm2, DI 0.27, SVI 33.   She tells me today that she *** She lives in *** She is retired Engineer, manufacturing ***  Primary Care Physician: Gerome Brunet, DO Primary Cardiologist: Court Referring Cardiologist: Court  Past Medical History:  Diagnosis Date   Arthritis    Asthma    CHF (congestive heart failure) (HCC)    Diabetes mellitus without complication (HCC)    borderline   Diarrhea    Diverticulosis    Dysrhythmia    controlled with Metoprolol -12-19-13 LOV -Dr. Court sees yearly   Family history of premature CAD    GERD (gastroesophageal reflux disease)    Heart murmur    yrs ago   HLD (hyperlipidemia)    pt reports cholesterol normal now (07/21/19).   Hypertension    Lower extremity edema    Status post dilation of esophageal narrowing    Supraventricular arrhythmia    Tobacco abuse     Past Surgical History:  Procedure Laterality Date   ANTERIOR CERVICAL DECOMP/DISCECTOMY FUSION N/A 06/08/2015   Procedure: CERVICAL FIVE-SIX ,CERVICAL SIX-SEVEN ANTERIOR CERVICAL DECOMPRESSION/DISCECTOMY FUSION PLATING BONEGRAFT;  Surgeon: Victory Gens, MD;  Location: [facility];  Service: Neurosurgery;  Laterality: N/A;   CARDIAC CATHETERIZATION  01/15/2009   non-critical CAD, 60% mid LAD lesion (Dr. DOROTHA Court)   CATARACT EXTRACTION, BILATERAL     most recent 11/19   COLONOSCOPY     ESOPHAGEAL DILATION  2014   infected lymph node surgery  80's   NM MYOCAR PERF WALL MOTION  04/2011    bruce myoview ; normal pattern of perfusion, EF 80%, low risk scan   SINUS ENDO WITH FUSION Bilateral 03/04/2020   Procedure: SINUS ENDOSCOPY WITH FUSION NAVIGATION;  Surgeon: Mable Lenis, MD;  Location: [facility];  Service: ENT;  Laterality: Bilateral;   TONSILLECTOMY  70's   TRANSTHORACIC ECHOCARDIOGRAM  12/2007   mild conc LVH   TURBINATE REDUCTION Bilateral 03/04/2020   Procedure: TURBINATE REDUCTION;  Surgeon: Mable Lenis, MD;  Location: [facility];  Service: ENT;  Laterality: Bilateral;    Current Outpatient Medications  Medication Sig Dispense Refill   albuterol  (VENTOLIN  HFA) 108 (90 Base) MCG/ACT inhaler Inhale 2 puffs into the lungs every 4 (four) hours as needed for wheezing or shortness of breath. 18 g 1   amiodarone  (PACERONE ) 200 MG tablet TAKE 1 TABLET EVERY DAY 90 tablet 3   aspirin  EC 81 MG tablet Take 1 tablet (81 mg total) by mouth daily. Swallow whole. 90 tablet 3   benralizumab  (FASENRA ) 30 MG/ML prefilled syringe Inject 1 mL (30 mg total) into the skin every 8 (eight) weeks. 1 mL 6   budesonide -formoterol  (SYMBICORT ) 80-4.5 MCG/ACT inhaler Inhale 2 puffs into the lungs in the morning and at bedtime. 10.2 g 11   cetirizine  (ZYRTEC ) 10 MG tablet TAKE 1 TABLET 2 (TWO) TIMES DAILY AS NEEDED FOR ALLERGIES (CAN USE AN EXTRA DOSE DURING FLARE UPS.) 180 tablet 3   EPINEPHrine  0.3 mg/0.3 mL  IJ SOAJ injection Inject 0.3 mg into the muscle as needed (anaphylaxis). 2 each 1   famotidine  (PEPCID ) 40 MG tablet TAKE 1 TABLET AT BEDTIME (KEEP UPCOMING APPOINTMENT FOR FURTHER REFILLS) 30 tablet 5   FLUAD QUADRIVALENT 0.5 ML injection Inject 0.5 mLs into the muscle once.     fluticasone  (FLONASE ) 50 MCG/ACT nasal spray Place 1 spray into both nostrils daily. 48 g 1   furosemide  (LASIX ) 40 MG tablet TAKE 1 TABLET EVERY DAY AND AN EXTRA 1/2 TABLET DAILY AS NEEDED 135 tablet 0   hydrALAZINE  (APRESOLINE ) 25 MG tablet Take 0.5 tablets (12.5 mg total) by  mouth 3 (three) times daily as needed (12.5 mg up to three times daily as needed for Systolic BP greater than 160). 270 tablet 1   ipratropium-albuterol  (DUONEB) 0.5-2.5 (3) MG/3ML SOLN Take 3 mLs by nebulization every 6 (six) hours as needed (wheezing, shortness of breath). 150 mL 1   isosorbide  mononitrate (IMDUR ) 30 MG 24 hr tablet Take 1 tablet (30 mg total) by mouth daily. 90 tablet 1   levocetirizine (XYZAL ) 5 MG tablet Take 1 tablet (5 mg total) by mouth daily as needed for allergies (Can take an extra dose during flare ups.). (Patient not taking: Reported on 01/17/2024) 180 tablet 1   metoprolol  succinate (TOPROL -XL) 50 MG 24 hr tablet Take 1 tablet (50 mg total) by mouth at bedtime. Take with or immediately following a meal. 90 tablet 1   montelukast  (SINGULAIR ) 10 MG tablet Take 1 tablet (10 mg total) by mouth at bedtime. 90 tablet 1   omeprazole  (PRILOSEC) 40 MG capsule Take 1 capsule (40 mg total) by mouth daily. 30 capsule 5   Probiotic Product (PROBIOTIC DAILY PO) Take 1 capsule by mouth daily.     Respiratory Therapy Supplies (NEBULIZER MASK ADULT) MISC 1 Device by Does not apply route as directed. 1 each 1   simvastatin  (ZOCOR ) 40 MG tablet TAKE 1 TABLET EVERY DAY 90 tablet 0   Spacer/Aero-Holding Chambers (AEROCHAMBER MV) inhaler 1 each by Other route daily. Use as instructed 1 each 1   SPIKEVAX injection Inject 0.5 mLs into the muscle once.     Current Facility-Administered Medications  Medication Dose Route Frequency Provider Last Rate Last Admin   Benralizumab  SOSY 30 mg  30 mg Subcutaneous Q8 weeks Jeneal Danita Macintosh, MD   30 mg at 01/08/24 9144    Allergies  Allergen Reactions   Nexium  [Esomeprazole  Magnesium ] Anaphylaxis    Pt states she was unable to swallow for several hours after taking the nexium    Ace Inhibitors Swelling    Angioedema    Codeine Other (See Comments)    REACTION: chest pain   Monosodium Glutamate Other (See Comments)    dizziness   Other  Hives, Swelling and Other (See Comments)    Red Fish (Hives) Bojangle's chicken (swelling and shortness of breath) PAPRIKA   Amlodipine  Other (See Comments)    Lower extremity swelling, itching   Latex Rash    Causes skin to turn red per patient    Shellfish Allergy Hives   Tape Rash    Coban wrap turned her arm red (after stress test)    Social History   Socioeconomic History   Marital status: Single    Spouse name: Not on file   Number of children: 0   Years of education: 12+   Highest education level: Not on file  Occupational History   Occupation: Reitred  Tobacco Use   Smoking status:  Former    Current packs/day: 0.00    Average packs/day: 0.3 packs/day for 15.0 years (3.8 ttl pk-yrs)    Types: Cigarettes    Start date: 02/03/2001    Quit date: 02/04/2016    Years since quitting: 7.9    Passive exposure: Past   Smokeless tobacco: Never  Vaping Use   Vaping status: Never Used  Substance and Sexual Activity   Alcohol  use: Not Currently    Comment: occasional - tequila shots on holidays   Drug use: No   Sexual activity: Not on file  Other Topics Concern   Not on file  Social History Narrative   Lives alone    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)   Social Drivers of Corporate investment banker Strain: Not on file  Food Insecurity: Not on file  Transportation Needs: Not on file  Physical Activity: Not on file  Stress: Not on file  Social Connections: Not on file  Intimate Partner Violence: Not on file    Family History  Problem Relation Age of Onset   Heart disease Brother        MI @ 28, HTN @ 50, DM @ 58    Heart disease Father        MI   Heart disease Mother        MI, CVA @ 93   Heart disease Sister        CHF, CVA   Breast cancer Sister 47   Esophageal cancer Brother    Alzheimer's disease Sister    Heart disease Brother        MI in 30s, lung cancer   Heart disease Sister    Hypertension Sister        x2    Hyperlipidemia Sister        x2   Diabetes Sister        x2   Cancer Sister    Hypertension Brother    Colon cancer Neg Hx     Review of Systems:  As stated in the HPI and otherwise negative.   There were no vitals taken for this visit.  Physical Examination: General: Well developed, well nourished, NAD  HEENT: OP clear, mucus membranes moist  SKIN: warm, dry. No rashes. Neuro: No focal deficits  Musculoskeletal: Muscle strength 5/5 all ext  Psychiatric: Mood and affect normal  Neck: No JVD, no carotid bruits, no thyromegaly, no lymphadenopathy.  Lungs:Clear bilaterally, no wheezes, rhonci, crackles Cardiovascular: Regular rate and rhythm. *** Loud, harsh, late peaking systolic murmur.  Abdomen:Soft. Bowel sounds present. Non-tender.  Extremities: *** No lower extremity edema. Pulses are 2 + in the bilateral DP/PT.  EKG:  EKG {ACTION; IS/IS [id number]:[id number]} ordered today. The ekg ordered today demonstrates ***  Echo January 2025: 1. Left ventricular ejection fraction, by estimation, is 70 to 75%. Left  ventricular ejection fraction by 3D volume is 73 %. The left ventricle has  hyperdynamic function. The left ventricle has no regional wall motion  abnormalities. Left ventricular  diastolic parameters are indeterminate. Elevated left ventricular  end-diastolic pressure.   2. Right ventricular systolic function is normal. The right ventricular  size is normal.   3. The mitral valve is normal in structure. Trivial mitral valve  regurgitation. No evidence of mitral stenosis.   4. The aortic valve is calcified. There is moderate calcification of the  aortic valve. There is moderate thickening of the aortic valve. Aortic  valve regurgitation is trivial.  Moderate aortic valve stenosis. Aortic  valve area, by VTI measures 0.85 cm.  Aortic valve mean gradient measures 14.0 mmHg. Aortic valve Vmax measures  2.65 m/s. Although the mean AVG and V max are consistent with mild AS, the   SVI is low at 33 and DVI is low at 0.27. 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   5. The inferior vena cava is normal in size with greater than 50%  respiratory variability, suggesting right atrial pressure of 3 mmHg.   6. Compared to echo dated 06/27/2022, the mean AVG has increased form 11  to , V max has increased from 2.54m/s to 2.52m/s, AVA decreased from  1.34cm2 to 0.85cm2 (VTI) and DVI has decreased from 0.43 to 0.27.   FINDINGS   Left Ventricle: Left ventricular ejection fraction, by estimation, is 70  to 75%. Left ventricular ejection fraction by 3D volume is 73 %. The left  ventricle has hyperdynamic function. The left ventricle has no regional  wall motion abnormalities. The  left ventricular internal cavity size was normal in size. There is no left  ventricular hypertrophy. Left ventricular diastolic parameters are  indeterminate. Elevated left ventricular end-diastolic pressure.   Right Ventricle: The right ventricular size is normal. No increase in  right ventricular wall thickness. Right ventricular systolic function is  normal.   Left Atrium: Left atrial size was normal in size.   Right Atrium: Right atrial size was normal in size.   Pericardium: There is no evidence of pericardial effusion.   Mitral Valve: The mitral valve is normal in structure. Mild mitral annular  calcification. Trivial mitral valve regurgitation. No evidence of mitral  valve stenosis.   Tricuspid Valve: The tricuspid valve is normal in structure. Tricuspid  valve regurgitation is trivial. No evidence of tricuspid stenosis.   Aortic Valve: The aortic valve is calcified. There is moderate  calcification of the aortic valve. There is moderate thickening of the  aortic valve. Aortic valve regurgitation is trivial. Moderate aortic  stenosis is present. Aortic valve mean gradient  measures 14.0 mmHg. Aortic valve peak gradient measures  28.1 mmHg. Aortic  valve area, by VTI measures 0.85 cm.   Pulmonic Valve: The pulmonic valve was normal in structure. Pulmonic valve  regurgitation is trivial. No evidence of pulmonic stenosis.   Aorta: The aortic root is normal in size and structure.   Venous: The inferior vena cava is normal in size with greater than 50%  respiratory variability, suggesting right atrial pressure of 3 mmHg.   IAS/Shunts: No atrial level shunt detected by color flow Doppler.     LEFT VENTRICLE  PLAX 2D  LVIDd:         4.00 cm         Diastology  LVIDs:         2.30 cm         LV e' medial:    5.22 cm/s  LV PW:         0.70 cm         LV E/e' medial:  20.3  LV IVS:        0.90 cm         LV e' lateral:   9.25 cm/s  LVOT diam:     2.00 cm         LV E/e' lateral: 11.5  LV SV:         64  LV SV Index:  33  LVOT Area:     3.14 cm        3D Volume EF                                 LV 3D EF:    Left                                              ventricul                                              ar                                              ejection                                              fraction                                              by 3D                                              volume is                                              73 %.                                   3D Volume EF:                                 3D EF:        73 %                                 LV EDV:       111 ml                                 LV ESV:       30 ml                                 LV SV:        81 ml   RIGHT VENTRICLE  RV Basal diam:  3.20 cm  RV S prime:     9.68 cm/s  TAPSE (M-mode): 2.0 cm  RVSP:           22.4 mmHg   LEFT ATRIUM             Index        RIGHT ATRIUM           Index  LA diam:        4.20 cm 2.21 cm/m   RA Pressure: 3.00 mmHg  LA Vol (A2C):   61.2 ml 32.20 ml/m  RA Area:     10.50 cm  LA Vol (A4C):   37.6 ml 19.79 ml/m  RA Volume:   20.50 ml  10.79 ml/m   LA Biplane Vol: 49.2 ml 25.89 ml/m   AORTIC VALVE  AV Area (Vmax):    0.93 cm  AV Area (Vmean):   0.98 cm  AV Area (VTI):     0.85 cm  AV Vmax:           265.00 cm/s  AV Vmean:          168.750 cm/s  AV VTI:            0.746 m  AV Peak Grad:      28.1 mmHg  AV Mean Grad:      14.0 mmHg  LVOT Vmax:         78.05 cm/s  LVOT Vmean:        52.750 cm/s  LVOT VTI:          0.202 m  LVOT/AV VTI ratio: 0.27    AORTA  Ao Root diam: 2.60 cm  Ao Asc diam:  3.30 cm   MITRAL VALVE                TRICUSPID VALVE  MV Area (PHT): cm          TR Peak grad:   19.4 mmHg  MV Decel Time: 243 msec     TR Vmax:        220.00 cm/s  MV E velocity: 106.00 cm/s  Estimated RAP:  3.00 mmHg  MV A velocity: 103.00 cm/s  RVSP:           22.4 mmHg  MV E/A ratio:  1.03                              SHUNTS                              Systemic VTI:  0.20 m                              Systemic Diam: 2.00 cm   Recent Labs: 03/14/2023: ALT 23 05/22/2023: BUN 17; Creatinine, Ser 1.19; Potassium 4.2; Sodium 139 07/19/2023: Hemoglobin 12.9; Platelets 229 08/07/2023: TSH 1.68   Lipid Panel    Component Value Date/Time   CHOL 116 09/02/2021 0216   TRIG 61 09/02/2021 0216   HDL 41 09/02/2021 0216   CHOLHDL 2.8 09/02/2021 0216   VLDL 12 09/02/2021 0216   LDLCALC 63 09/02/2021 0216     Wt Readings from Last 3 Encounters:  01/17/24 182 lb (82.6 kg)  11/13/23 184 lb 3.2 oz (83.6 kg)  10/02/23 185 lb (83.9 kg)     Assessment and  Plan:   1. Severe Aortic Valve Stenosis: She has moderate low flow/low gradient aortic stenosis. NYHA class *** . I have personally reviewed the echo images. The aortic valve is thickened and calcified with limited leaflet mobility. I think *** would benefit from AVR. Given advanced age, *** is not a good candidate for conventional AVR by surgical approach. I think *** may be a good candidate for TAVR.   I have reviewed the natural history of aortic stenosis with the patient and  their family members  who are present today. We have discussed the limitations of medical therapy and the poor prognosis associated with symptomatic aortic stenosis. We have reviewed potential treatment options, including palliative medical therapy, conventional surgical aortic valve replacement, and transcatheter aortic valve replacement. We discussed treatment options in the context of the patient's specific comorbid medical conditions.    *** would like to proceed with planning for TAVR. I will arrange a right and left heart catheterization at [facility] ***. Risks and benefits of the cath procedure and the valve procedure are reviewed with the patient. After the cath, *** will have a cardiac CT, CTA of the chest/abdomen and pelvis and will then be referred to see one of the CT surgeons on our TAVR team.     Labs/ tests ordered today include:  No orders of the defined types were placed in this encounter.    Disposition:   F/U will be arranged with the structural team  Signed, Lonni Cash, MD, [facility] 01/30/2024 4:50 PM    [facility] [address] [place], [place], [place]  [address] Phone: [phone]; Fax: [phone]

## 2024-01-31 ENCOUNTER — Telehealth: Payer: Self-pay | Admitting: Cardiovascular Disease

## 2024-01-31 ENCOUNTER — Ambulatory Visit: Attending: Cardiovascular Disease | Admitting: Cardiovascular Disease

## 2024-01-31 ENCOUNTER — Encounter: Payer: Self-pay | Admitting: Cardiovascular Disease

## 2024-01-31 ENCOUNTER — Institutional Professional Consult (permissible substitution): Admitting: Cardiovascular Disease

## 2024-01-31 VITALS — BP 138/78 | HR 62 | Ht 65.0 in | Wt 179.2 lb

## 2024-01-31 DIAGNOSIS — I350 Nonrheumatic aortic (valve) stenosis: Secondary | ICD-10-CM

## 2024-01-31 MED ORDER — ISOSORBIDE MONONITRATE ER 30 MG PO TB24: 30.0000 mg | ORAL_TABLET | Freq: Every day | ORAL | 1 refills | Status: DC

## 2024-01-31 NOTE — Patient Instructions (Signed)
 Medication Instructions:  No changes *If you need a refill on your cardiac medications before your next appointment, please call your pharmacy*  Lab Work: none  Testing/Procedures: ECHO DUE IN JAN 2026 Your physician has requested that you have an echocardiogram. Echocardiography is a painless test that uses sound waves to create images of your heart. It provides your doctor with information about the size and shape of your heart and how well your heart's chambers and valves are working. This procedure takes approximately one hour. There are no restrictions for this procedure. Please do NOT wear cologne, perfume, aftershave, or lotions (deodorant is allowed). Please arrive 15 minutes prior to your appointment time.  Please note: We ask at that you not bring children with you during ultrasound (echo/ vascular) testing. Due to room size and safety concerns, children are not allowed in the ultrasound rooms during exams. Our front office staff cannot provide observation of children in our lobby area while testing is being conducted. An adult accompanying a patient to their appointment will only be allowed in the ultrasound room at the discretion of the ultrasound technician under special circumstances. We apologize for any inconvenience.   Follow-Up: At [facility], you and your health needs are our priority.  As part of our continuing mission to provide you with exceptional heart care, our providers are all part of one team.  This team includes your primary Cardiologist (physician) and Advanced Practice Providers or APPs (Physician Assistants and Nurse Practitioners) who all work together to provide you with the care you need, when you need it.  Your next appointment:   6 month(s) AFTER ECHOCARDIOGRAM  Provider:   Lonni Cash, MD

## 2024-01-31 NOTE — Telephone Encounter (Signed)
 *  STAT* If patient is at the pharmacy, call can be transferred to refill team.   1. Which medications need to be refilled? (please list name of each medication and dose if known) isosorbide mononitrate (IMDUR) 30 MG 24 hr tablet   2. Which pharmacy/location (including street and city if local pharmacy) is medication to be sent to? [facility] - [place], [place] - [address]   3. Do they need a 30 day or 90 day supply? 90

## 2024-01-31 NOTE — Telephone Encounter (Signed)
 Refills has been sent to the pharmacy.

## 2024-02-03 ENCOUNTER — Encounter (HOSPITAL_COMMUNITY): Payer: Self-pay

## 2024-02-03 ENCOUNTER — Emergency Department (HOSPITAL_COMMUNITY)

## 2024-02-03 ENCOUNTER — Other Ambulatory Visit: Payer: Self-pay

## 2024-02-03 ENCOUNTER — Emergency Department (HOSPITAL_COMMUNITY)
Admission: EM | Admit: 2024-02-03 | Discharge: 2024-02-03 | Disposition: A | Discharge status: Home / Self Care | Attending: Emergency Medicine | Admitting: Emergency Medicine

## 2024-02-03 DIAGNOSIS — I110 Hypertensive heart disease with heart failure: Secondary | ICD-10-CM | POA: Insufficient documentation

## 2024-02-03 DIAGNOSIS — Z7982 Long term (current) use of aspirin: Secondary | ICD-10-CM | POA: Diagnosis not present

## 2024-02-03 DIAGNOSIS — M5441 Lumbago with sciatica, right side: Secondary | ICD-10-CM | POA: Insufficient documentation

## 2024-02-03 DIAGNOSIS — Z79899 Other long term (current) drug therapy: Secondary | ICD-10-CM | POA: Insufficient documentation

## 2024-02-03 DIAGNOSIS — M47816 Spondylosis without myelopathy or radiculopathy, lumbar region: Secondary | ICD-10-CM | POA: Diagnosis not present

## 2024-02-03 DIAGNOSIS — M8580 Other specified disorders of bone density and structure, unspecified site: Secondary | ICD-10-CM | POA: Diagnosis not present

## 2024-02-03 DIAGNOSIS — I509 Heart failure, unspecified: Secondary | ICD-10-CM | POA: Diagnosis not present

## 2024-02-03 DIAGNOSIS — M549 Dorsalgia, unspecified: Secondary | ICD-10-CM | POA: Diagnosis not present

## 2024-02-03 DIAGNOSIS — M545 Low back pain, unspecified: Secondary | ICD-10-CM | POA: Diagnosis present

## 2024-02-03 DIAGNOSIS — R109 Unspecified abdominal pain: Secondary | ICD-10-CM | POA: Diagnosis not present

## 2024-02-03 DIAGNOSIS — R7989 Other specified abnormal findings of blood chemistry: Secondary | ICD-10-CM | POA: Diagnosis not present

## 2024-02-03 DIAGNOSIS — Z91040 Latex allergy status: Secondary | ICD-10-CM | POA: Insufficient documentation

## 2024-02-03 DIAGNOSIS — E1165 Type 2 diabetes mellitus with hyperglycemia: Secondary | ICD-10-CM | POA: Insufficient documentation

## 2024-02-03 DIAGNOSIS — M5431 Sciatica, right side: Secondary | ICD-10-CM

## 2024-02-03 DIAGNOSIS — K5730 Diverticulosis of large intestine without perforation or abscess without bleeding: Secondary | ICD-10-CM | POA: Diagnosis not present

## 2024-02-03 DIAGNOSIS — D72819 Decreased white blood cell count, unspecified: Secondary | ICD-10-CM | POA: Diagnosis not present

## 2024-02-03 LAB — CBC
HCT: 40.1 % (ref 36.0–46.0)
Hemoglobin: 13.1 g/dL (ref 12.0–15.0)
MCH: 30.5 pg (ref 26.0–34.0)
MCHC: 32.7 g/dL (ref 30.0–36.0)
MCV: 93.3 fL (ref 80.0–100.0)
Platelets: 210 K/uL (ref 150–400)
RBC: 4.3 MIL/uL (ref 3.87–5.11)
RDW: 13.3 % (ref 11.5–15.5)
WBC: 3.6 K/uL — ABNORMAL LOW (ref 4.0–10.5)
nRBC: 0 % (ref 0.0–0.2)

## 2024-02-03 LAB — COMPREHENSIVE METABOLIC PANEL WITH GFR
ALT: 19 U/L (ref 0–44)
AST: 23 U/L (ref 15–41)
Albumin: 3.2 g/dL — ABNORMAL LOW (ref 3.5–5.0)
Alkaline Phosphatase: 63 U/L (ref 38–126)
Anion gap: 12 (ref 5–15)
BUN: 12 mg/dL (ref 8–23)
CO2: 22 mmol/L (ref 22–32)
Calcium: 8.7 mg/dL — ABNORMAL LOW (ref 8.9–10.3)
Chloride: 103 mmol/L (ref 98–111)
Creatinine, Ser: 1.11 mg/dL — ABNORMAL HIGH (ref 0.44–1.00)
GFR, Estimated: 49 mL/min — ABNORMAL LOW (ref >60)
Glucose, Bld: 144 mg/dL — ABNORMAL HIGH (ref 70–99)
Potassium: 4.1 mmol/L (ref 3.5–5.1)
Sodium: 137 mmol/L (ref 135–145)
Total Bilirubin: 0.7 mg/dL (ref 0.0–1.2)
Total Protein: 6.5 g/dL (ref 6.5–8.1)

## 2024-02-03 LAB — URINALYSIS, ROUTINE W REFLEX MICROSCOPIC
Bacteria, UA: NONE SEEN
Bilirubin Urine: NEGATIVE
Glucose, UA: NEGATIVE mg/dL
Hgb urine dipstick: NEGATIVE
Ketones, ur: NEGATIVE mg/dL
Nitrite: NEGATIVE
Protein, ur: NEGATIVE mg/dL
Specific Gravity, Urine: >1.046 — ABNORMAL HIGH (ref 1.005–1.030)
pH: 5 (ref 5.0–8.0)

## 2024-02-03 LAB — LIPASE, BLOOD: Lipase: 43 U/L (ref 11–51)

## 2024-02-03 MED ORDER — KETOROLAC TROMETHAMINE 15 MG/ML IJ SOLN
15.0000 mg | Freq: Once | INTRAMUSCULAR | Status: AC
  Administered 2024-02-03: 15 mg via INTRAVENOUS
  Filled 2024-02-03: qty 1

## 2024-02-03 MED ORDER — METHOCARBAMOL 500 MG PO TABS: 500.0000 mg | ORAL_TABLET | Freq: Two times a day (BID) | ORAL | 0 refills | Status: AC

## 2024-02-03 MED ORDER — IOHEXOL 350 MG/ML SOLN
75.0000 mL | Freq: Once | INTRAVENOUS | Status: AC | PRN
  Administered 2024-02-03: 75 mL via INTRAVENOUS

## 2024-02-03 MED ORDER — KETOROLAC TROMETHAMINE 10 MG PO TABS: 10.0000 mg | ORAL_TABLET | Freq: Four times a day (QID) | ORAL | 0 refills | Status: DC | PRN

## 2024-02-03 NOTE — ED Provider Notes (Signed)
 [facility] [facility] Provider Note   CSN: [id number] Arrival date & time: 02/03/24  1203     Patient presents with: Abdominal Pain and Back Pain   Megan Hull is a 83 y.o. female with past medical history significant for hypertension, GERD, hyperlipidemia, diabetes, CHF who presents concern for right lower abdominal pain, right lower back pain.  Patient recently with some painless vaginal bleeding, she saw her OB/GYN, she is supposed to have a ultrasound and endometrial biopsy next week.  Normal urinalysis, culture, pelvic exam.  She denies any fever, chills, nausea, vomiting.  She has had sciatic nerve pain in the past.  She reports that she has been taking Tylenol  without significant relief.  Pain radiates from her right lower back into her right groin crease.  She denies any incontinence, urinary retention.  No saddle anesthesia.    Abdominal Pain Back Pain Associated symptoms: abdominal pain        Prior to Admission medications   Medication Sig Start Date End Date Taking? Authorizing Provider  ketorolac  (TORADOL ) 10 MG tablet Take 1 tablet (10 mg total) by mouth every 6 (six) hours as needed. 02/03/24  Yes Cosette Prindle H, PA-C  methocarbamol  (ROBAXIN ) 500 MG tablet Take 1 tablet (500 mg total) by mouth 2 (two) times daily. 02/03/24  Yes Caetano Oberhaus H, PA-C  albuterol  (VENTOLIN  HFA) 108 (90 Base) MCG/ACT inhaler Inhale 2 puffs into the lungs every 4 (four) hours as needed for wheezing or shortness of breath. 05/22/23   Kozlow, Camellia PARAS, MD  amiodarone  (PACERONE ) 200 MG tablet TAKE 1 TABLET EVERY DAY 11/08/23   Emelia Josefa HERO, NP  aspirin  EC 81 MG tablet Take 1 tablet (81 mg total) by mouth daily. Swallow whole. 09/03/20   Emelia Josefa HERO, NP  benralizumab  (FASENRA ) 30 MG/ML prefilled syringe Inject 1 mL (30 mg total) into the skin every 8 (eight) weeks. 06/14/23   Kozlow, Camellia PARAS, MD  budesonide -formoterol  (SYMBICORT ) 80-4.5  MCG/ACT inhaler Inhale 2 puffs into the lungs in the morning and at bedtime. 01/15/24   Kara Dorn NOVAK, MD  cetirizine  (ZYRTEC ) 10 MG tablet TAKE 1 TABLET 2 (TWO) TIMES DAILY AS NEEDED FOR ALLERGIES (CAN USE AN EXTRA DOSE DURING FLARE UPS.) 11/13/23   Kozlow, Camellia PARAS, MD  EPINEPHrine  0.3 mg/0.3 mL IJ SOAJ injection Inject 0.3 mg into the muscle as needed (anaphylaxis). 05/16/22   Kozlow, Camellia PARAS, MD  famotidine  (PEPCID ) 40 MG tablet TAKE 1 TABLET AT BEDTIME (KEEP UPCOMING APPOINTMENT FOR FURTHER REFILLS) 11/09/23   Kozlow, Camellia PARAS, MD  FLUAD QUADRIVALENT 0.5 ML injection Inject 0.5 mLs into the muscle once. 03/21/22   [provider]  fluticasone  (FLONASE ) 50 MCG/ACT nasal spray Place 1 spray into both nostrils daily. 05/22/23   Kozlow, Camellia PARAS, MD  furosemide  (LASIX ) 40 MG tablet TAKE 1 TABLET EVERY DAY AND AN EXTRA 1/2 TABLET DAILY AS NEEDED 01/24/24   Emelia Josefa HERO, NP  hydrALAZINE  (APRESOLINE ) 25 MG tablet Take 0.5 tablets (12.5 mg total) by mouth 3 (three) times daily as needed (12.5 mg up to three times daily as needed for Systolic BP greater than 160). 07/19/23   Cleaver, Josefa HERO, NP  ipratropium-albuterol  (DUONEB) 0.5-2.5 (3) MG/3ML SOLN Take 3 mLs by nebulization every 6 (six) hours as needed (wheezing, shortness of breath). 05/22/23   Kozlow, Camellia PARAS, MD  isosorbide  mononitrate (IMDUR ) 30 MG 24 hr tablet Take 1 tablet (30 mg total) by mouth daily. 01/31/24  Court Dorn PARAS, MD  levocetirizine (XYZAL ) 5 MG tablet Take 1 tablet (5 mg total) by mouth daily as needed for allergies (Can take an extra dose during flare ups.). 05/22/23   Kozlow, Camellia PARAS, MD  metoprolol  succinate (TOPROL -XL) 50 MG 24 hr tablet Take 1 tablet (50 mg total) by mouth at bedtime. Take with or immediately following a meal. 07/19/23   Cleaver, Josefa HERO, NP  misoprostol (CYTOTEC) 100 MCG tablet Take by mouth.    [provider]  montelukast  (SINGULAIR ) 10 MG tablet Take 1 tablet (10 mg total) by mouth at bedtime.  11/14/23   Cheryl Reusing, FNP  omeprazole  (PRILOSEC) 40 MG capsule Take 1 capsule (40 mg total) by mouth daily. 05/22/23   Kozlow, Eric J, MD  Probiotic Product (PROBIOTIC DAILY PO) Take 1 capsule by mouth daily.    [provider]  Respiratory Therapy Supplies (NEBULIZER MASK ADULT) MISC 1 Device by Does not apply route as directed. 05/22/23   Kozlow, Camellia PARAS, MD  simvastatin  (ZOCOR ) 40 MG tablet TAKE 1 TABLET EVERY DAY 01/24/24   Cleaver, Josefa HERO, NP  Spacer/Aero-Holding Chambers (AEROCHAMBER MV) inhaler 1 each by Other route daily. Use as instructed 05/22/23   Kozlow, Eric J, MD  SPIKEVAX injection Inject 0.5 mLs into the muscle once. 03/31/22   [provider]    Allergies: Nexium  [esomeprazole  magnesium ], Ace inhibitors, Codeine, Monosodium glutamate, Other, Amlodipine , Latex, Shellfish allergy, and Tape    Review of Systems  Gastrointestinal:  Positive for abdominal pain.  Musculoskeletal:  Positive for back pain.  All other systems reviewed and are negative.   Updated Vital Signs BP (!) 163/83   Pulse (!) 57   Temp 97.9 F (36.6 C) (Axillary)   Resp 20   Ht 5' 5 (1.651 m)   Wt 81.2 kg   SpO2 98%   BMI 29.79 kg/m   Physical Exam Vitals and nursing note reviewed.  Constitutional:      General: She is not in acute distress.    Appearance: Normal appearance.  HENT:     Head: Normocephalic and atraumatic.  Eyes:     General:        Right eye: No discharge.        Left eye: No discharge.  Cardiovascular:     Rate and Rhythm: Normal rate and regular rhythm.     Pulses: Normal pulses.     Heart sounds: No murmur heard.    No friction rub. No gallop.     Comments: DP, PT pulses are 2+ in bilateral lower extremities. Pulmonary:     Effort: Pulmonary effort is normal.     Breath sounds: Normal breath sounds.  Abdominal:     General: Bowel sounds are normal.     Palpations: Abdomen is soft.     Comments: Mild tenderness to palpation in the lower  abdomen, no rebound, rigidity, guarding.    Musculoskeletal:     Comments: She has focal right lower back tenderness to palpation.  She has positive straight leg raise on right.  Skin:    General: Skin is warm and dry.     Capillary Refill: Capillary refill takes less than 2 seconds.  Neurological:     Mental Status: She is alert and oriented to person, place, and time.     Comments: Normal sensation throughout bilateral lower extremities.  Psychiatric:        Mood and Affect: Mood normal.        Behavior:  Behavior normal.     (all labs ordered are listed, but only abnormal results are displayed) Labs Reviewed  COMPREHENSIVE METABOLIC PANEL WITH GFR - Abnormal; Notable for the following components:      Result Value   Glucose, Bld 144 (*)    Creatinine, Ser 1.11 (*)    Calcium  8.7 (*)    Albumin 3.2 (*)    GFR, Estimated 49 (*)    All other components within normal limits  CBC - Abnormal; Notable for the following components:   WBC 3.6 (*)    All other components within normal limits  URINALYSIS, ROUTINE W REFLEX MICROSCOPIC - Abnormal; Notable for the following components:   Specific Gravity, Urine >1.046 (*)    Leukocytes,Ua SMALL (*)    All other components within normal limits  LIPASE, BLOOD    EKG: None  Radiology: CT ABDOMEN PELVIS W CONTRAST Result Date: 02/03/2024 CLINICAL DATA:  Abdominal and back pain. EXAM: CT ABDOMEN AND PELVIS WITH CONTRAST TECHNIQUE: Multidetector CT imaging of the abdomen and pelvis was performed using the standard protocol following bolus administration of intravenous contrast. RADIATION DOSE REDUCTION: This exam was performed according to the departmental dose-optimization program which includes automated exposure control, adjustment of the mA and/or kV according to patient size and/or use of iterative reconstruction technique. CONTRAST:  75mL OMNIPAQUE  IOHEXOL  350 MG/ML SOLN COMPARISON:  Abdominal CT dated 07/15/2007. FINDINGS: Lower chest:  The visualized lung bases are clear. There is coronary vascular calcification. No intra-abdominal free air or free fluid. Hepatobiliary: The liver is unremarkable. No biliary dilatation. Probable small gallstone versus a focus of gallbladder wall calcification. No pericholecystic fluid or evidence of acute cholecystitis by CT Pancreas: Unremarkable. No pancreatic ductal dilatation or surrounding inflammatory changes. Spleen: Normal in size without focal abnormality. Adrenals/Urinary Tract: The adrenal glands unremarkable. There is no hydronephrosis on either side. There is symmetric enhancement and excretion of contrast by both kidneys. The visualized ureters and urinary bladder appear unremarkable. Stomach/Bowel: There is diffuse colonic diverticulosis. There is no bowel obstruction or active inflammation. The appendix is normal. Vascular/Lymphatic: Mild aortoiliac atherosclerotic disease. The IVC is unremarkable. No portal venous gas. There is no adenopathy. Reproductive: The uterus is grossly unremarkable. Probable small exophytic uterine fibroids. Other: None Musculoskeletal: Osteopenia with mild degenerative changes. No acute osseous pathology. IMPRESSION: 1. No acute intra-abdominal or pelvic pathology. 2. Colonic diverticulosis. No bowel obstruction. Normal appendix. 3.  Aortic Atherosclerosis (ICD10-I70.0). Electronically Signed   By: Vanetta Chou M.D.   On: 02/03/2024 17:26   CT L-SPINE NO CHARGE Result Date: 02/03/2024 CLINICAL DATA:  Back pain. EXAM: CT LUMBAR SPINE WITHOUT CONTRAST TECHNIQUE: Multidetector CT imaging of the lumbar spine was performed without intravenous contrast administration. Multiplanar CT image reconstructions were also generated. RADIATION DOSE REDUCTION: This exam was performed according to the departmental dose-optimization program which includes automated exposure control, adjustment of the mA and/or kV according to patient size and/or use of iterative reconstruction  technique. COMPARISON:  None Available. FINDINGS: Segmentation: 5 lumbar type vertebrae. Alignment: Normal. Vertebrae: No acute fracture.  The bones are osteopenic. Paraspinal and other soft tissues: Negative. Disc levels: No acute findings. Mild degenerative changes. There is facet arthropathy primarily at L4-L5 and L5-S1. IMPRESSION: 1. No acute/traumatic lumbar spine pathology. 2. Mild degenerative changes. Electronically Signed   By: Vanetta Chou M.D.   On: 02/03/2024 17:21     .Ultrasound ED Peripheral IV (Provider)  Date/Time: 02/03/2024 3:01 PM  Performed by: Rosan Sherlean DEL, PA-C Authorized by: Rosan,  Rafiel Mecca H, PA-C   Procedure details:    Indications: poor IV access     Skin Prep: isopropyl alcohol      Location:  Left anterior forearm   Angiocath:  18 G   Bedside Ultrasound Guided: Yes     Images: not archived     Patient tolerated procedure without complications: Yes     Dressing applied: Yes      Medications Ordered in the ED  ketorolac  (TORADOL ) 15 MG/ML injection 15 mg (15 mg Intravenous Given 02/03/24 1429)  iohexol  (OMNIPAQUE ) 350 MG/ML injection 75 mL (75 mLs Intravenous Contrast Given 02/03/24 1635)                                    Medical Decision Making Amount and/or Complexity of Data Reviewed Labs: ordered. Radiology: ordered.  Risk Prescription drug management.   This patient is a 83 y.o. female  who presents to the ED for concern of abdominal pain, back pain.   Differential diagnoses prior to evaluation: The emergent differential diagnosis includes, but is not limited to,  The causes of generalized abdominal pain include but are not limited to AAA, mesenteric ischemia, appendicitis, diverticulitis, DKA, gastritis, gastroenteritis, AMI, nephrolithiasis, pancreatitis, peritonitis, adrenal insufficiency,lead poisoning, iron toxicity, intestinal ischemia, constipation, UTI,SBO/LBO, splenic rupture, biliary disease, IBD, IBS, PUD, or hepatitis  --especially in context of her recent painless vaginal bleeding in 1 to rule out intra-abdominal mass,. This is not an exhaustive differential.   Past Medical History / Co-morbidities / Social History: hypertension, GERD, hyperlipidemia, diabetes, CHF  Additional history: Chart reviewed. Pertinent results include: Reviewed outpatient cardiology, pulmonary visits, no recent emergency department visits noted. Reviewed recent metabolic panels for baseline kidney function.  Physical Exam: Physical exam performed. The pertinent findings include: Normal sensation throughout bilateral lower extremities.  She has focal right lower back tenderness to palpation.  She has positive straight leg raise on right. Mild tenderness to palpation in the lower abdomen, no rebound, rigidity, guarding.  DP, PT pulses are 2+ in bilateral lower extremities.  Vital signs overall stable in the emergency department other than some hypertension, blood pressure 141/88 on arrival, 163/83 prior to discharge.  She has mild bradycardia, pulse of 57 with Normal rhythm.  Stable oxygen saturation on room air, no tachypnea.  She is afebrile.  Lab Tests/Imaging studies: I personally interpreted labs/imaging and the pertinent results include: UA with some high specific gravity, otherwise no evidence of infection noted.  CBC overall unremarkable, very mild leukocytopenia, white blood cells 3.6.  Nonspecific.  No anemia.  CMP mildly elevated glucose at 144 for nonfasting lab values.  Her creatinine is mildly elevated at 1.11, but this is fairly stable compared to her baseline.  No anion gap.  Normal lipase.  I independently interpreted CT abdomen pelvis with contrast which shows no evidence of acute intra-abdominal abnormality, diverticulosis without diverticulitis noted.  L-spine with some mild age-related degenerative changes, no compression fracture or other acute injury. I agree with the radiologist interpretation.   Medications: I  ordered medication including Toradol  for pain.  I have reviewed the patients home medicines and have made adjustments as needed.  Will plan to discharge with course of Toradol , muscle relaxant, symptoms are consistent on my exam with sciatic nerve impingement on the right.  She has positive straight leg raise.  No acute intra-abdominal finding noted. I had a discussion with patient in regards to her recent  vaginal bleeding, plan for endometrial biopsy that she should continue to follow-up with her OB/GYN, no large ovarian or uterine mass noted on CT   Disposition: After consideration of the diagnostic results and the patients response to treatment, I feel that patient stable for discharge with plan as above.   emergency department workup does not suggest an emergent condition requiring admission or immediate intervention beyond what has been performed at this time. The plan is: as above. The patient is safe for discharge and has been instructed to return immediately for worsening symptoms, change in symptoms or any other concerns.   Final diagnoses:  Sciatica of right side    ED Discharge Orders          Ordered    ketorolac  (TORADOL ) 10 MG tablet  Every 6 hours PRN        02/03/24 1809    methocarbamol  (ROBAXIN ) 500 MG tablet  2 times daily        02/03/24 1809               Mazi Brailsford H, PA-C 02/03/24 1826    Cottie Donnice PARAS, MD 02/03/24 [phone]

## 2024-02-03 NOTE — ED Triage Notes (Signed)
 Pt c.o lower abd pain and lower back pain. Pt seen by her OBGYN and is scheduled for a endometrial biopsy next week. States she had a urinalysis and culture done last week that was negative for infection.

## 2024-02-03 NOTE — ED Notes (Signed)
 Hourly rounding complete. Pt alert or resting, no distress noted, offered toileting and diet as appropriate. Side rails up, call light within reach. Pt denies pain or further needs at this time.  Family at bedside

## 2024-02-03 NOTE — ED Notes (Signed)
 Pt family at bedside

## 2024-02-03 NOTE — ED Notes (Signed)
 Pt states she allergic to plastic cover on thermometer. They usually check temp via tympanic thermometer.

## 2024-02-03 NOTE — Discharge Instructions (Addendum)
 Please use Tylenol  for pain.  You may use 1000 mg of Tylenol  every 6 hours.  Not to exceed 4 g of Tylenol  within 24 hours.  You can take the anti-inflammatory medication Toradol  up to every 6 hours as needed in addition to the Tylenol .  It may be helpful to alternate the 2 medications.  I recommend having a close recheck of your kidney function with your primary care doctor since we are placing you on this anti-inflammatory medication.  You can use the muscle relaxant I am prescribing in addition to the above to help with any breakthrough pain.  You can take it up to twice daily.  It is safe to take at night, but I would be cautious taking it during the day as it can cause some drowsiness.  Make sure that you are feeling awake and alert before you get behind the wheel of a car or operate a motor vehicle.  It is not a narcotic pain medication so you are able to take it if it is not making you drowsy and still pilot a vehicle or machinery safely.  I would continue with your plan to follow-up with OB/GYN to discuss your vaginal bleeding and biopsy as discussed.

## 2024-02-03 NOTE — ED Notes (Signed)
Pt verbalizes understanding of DC instructions. Pt belongings returned and is ambulatory out of ED.  

## 2024-02-03 NOTE — ED Notes (Signed)
 Hourly rounding complete. Pt alert or resting, no distress noted, offered toileting and diet as appropriate. Side rails up, call light within reach. Pt denies pain or further needs at this time.  Awaiting scan results

## 2024-02-06 DIAGNOSIS — N950 Postmenopausal bleeding: Secondary | ICD-10-CM | POA: Diagnosis not present

## 2024-02-12 ENCOUNTER — Ambulatory Visit: Admitting: Family

## 2024-02-14 ENCOUNTER — Encounter: Payer: Self-pay | Admitting: Cardiology

## 2024-02-18 DIAGNOSIS — E785 Hyperlipidemia, unspecified: Secondary | ICD-10-CM | POA: Diagnosis not present

## 2024-02-18 DIAGNOSIS — E1159 Type 2 diabetes mellitus with other circulatory complications: Secondary | ICD-10-CM | POA: Diagnosis not present

## 2024-02-18 DIAGNOSIS — I10 Essential (primary) hypertension: Secondary | ICD-10-CM | POA: Diagnosis not present

## 2024-02-19 ENCOUNTER — Ambulatory Visit: Admitting: Allergy and Immunology

## 2024-02-19 VITALS — BP 130/64 | HR 61 | Temp 97.9°F | Resp 16 | Ht 65.0 in | Wt 183.8 lb

## 2024-02-19 DIAGNOSIS — T7800XA Anaphylactic reaction due to unspecified food, initial encounter: Secondary | ICD-10-CM

## 2024-02-19 DIAGNOSIS — T7800XD Anaphylactic reaction due to unspecified food, subsequent encounter: Secondary | ICD-10-CM | POA: Diagnosis not present

## 2024-02-19 DIAGNOSIS — J3089 Other allergic rhinitis: Secondary | ICD-10-CM

## 2024-02-19 DIAGNOSIS — J8283 Eosinophilic asthma: Secondary | ICD-10-CM | POA: Diagnosis not present

## 2024-02-19 DIAGNOSIS — K219 Gastro-esophageal reflux disease without esophagitis: Secondary | ICD-10-CM

## 2024-02-19 MED ORDER — IPRATROPIUM-ALBUTEROL 0.5-2.5 (3) MG/3ML IN SOLN: 3.0000 mL | Freq: Four times a day (QID) | RESPIRATORY_TRACT | 1 refills | Status: DC | PRN

## 2024-02-19 MED ORDER — EPINEPHRINE 0.3 MG/0.3ML IJ SOAJ: 0.3000 mg | INTRAMUSCULAR | 1 refills | Status: AC | PRN

## 2024-02-19 MED ORDER — ALBUTEROL SULFATE HFA 108 (90 BASE) MCG/ACT IN AERS: 2.0000 | INHALATION_SPRAY | RESPIRATORY_TRACT | 1 refills | Status: AC | PRN

## 2024-02-19 MED ORDER — MONTELUKAST SODIUM 10 MG PO TABS: 10.0000 mg | ORAL_TABLET | Freq: Every evening | ORAL | 1 refills | Status: AC

## 2024-02-19 MED ORDER — BUDESONIDE-FORMOTEROL FUMARATE 80-4.5 MCG/ACT IN AERO: 2.0000 | INHALATION_SPRAY | Freq: Two times a day (BID) | RESPIRATORY_TRACT | 11 refills | Status: DC

## 2024-02-19 MED ORDER — CETIRIZINE HCL 10 MG PO TABS: ORAL_TABLET | ORAL | 3 refills | Status: AC

## 2024-02-19 MED ORDER — FLUTICASONE PROPIONATE 50 MCG/ACT NA SUSP: 1.0000 | Freq: Every day | NASAL | 1 refills | Status: AC

## 2024-02-19 MED ORDER — OMEPRAZOLE 40 MG PO CPDR: 40.0000 mg | DELAYED_RELEASE_CAPSULE | Freq: Every day | ORAL | 5 refills | Status: AC

## 2024-02-19 MED ORDER — FAMOTIDINE 40 MG PO TABS: 40.0000 mg | ORAL_TABLET | Freq: Every day | ORAL | 5 refills | Status: AC

## 2024-02-19 NOTE — Patient Instructions (Addendum)
  1.  Allergen avoidance measures - dust mite, cat, dog, mammal consumption  2.  Continue to treat and prevent inflammation:  A. Symbicort  160 - 2  inhalations 2 times per day w/spacer (empty lungs).  B. Benralizumab  injections every 8 weeks C. Montelukast  10 mg - 1 tablet 1 time per day.    3.  Continue to treat and prevent reflux/LPR:  A. Omeprazole  40 mg - 1 tablet in AM B. Famotidine  40 mg - 1 tablet in PM  4.  If needed:  A. Albuterol  MDI or nebulization B. Nasal saline C. Antihistamine D. Epi-Pen   5. Influenza = Tamiflu. Covid = Paxlovid  6.  Return to clinic in 3 months or earlier if problem

## 2024-02-19 NOTE — Progress Notes (Unsigned)
 [place] [facility] [place] [facility] [place] - [place] [facility] [place]   Follow-up Note  Referring Provider: Gerome Brunet, DO Primary Provider: Gerome Brunet, DO Date of Office Visit: 02/19/2024  Subjective:   Megan Hull (DOB: 1941-03-19) is a 83 y.o. female who [facility] in re-evaluation of the following:  HPI: Megan Hull returns to this clinic in evaluation of asthma, allergic rhinitis, history of chronic pansinusitis and nasal polyposis, alpha-gal syndrome, LPR.  I last saw her in this clinic 22 May 2023 and she visited with our nurse practitioner on 13 November 2023.  Currently she is doing very well regarding her asthma while using benralizumab  injections every [redacted] weeks along with Symbicort  twice a day.  She has had very little problems with her nose while using montelukast  once a day.  She rarely uses a short acting bronchodilator and she can exert herself without much problem.  She has not required a systemic steroid or an antibiotic for any type of airway issue  Her reflux is under very good control as there is her throat issue while using omeprazole  and famotidine  in combination.  She does not consume mammal meat.  She continues to carry an EpiPen   Allergies as of 02/19/2024       Reactions   Nexium  [esomeprazole  Magnesium ] Anaphylaxis   Pt states she was unable to swallow for several hours after taking the nexium    Ace Inhibitors Swelling   Angioedema   Codeine Other (See Comments)   REACTION: chest pain   Monosodium Glutamate Other (See Comments)   dizziness   Other Hives, Swelling, Other (See Comments)   Red Fish (Hives) Bojangle's chicken (swelling and shortness of breath) PAPRIKA TEMPERATURE PROBE COVERS (lip swelling)   Amlodipine  Other (See Comments)   Lower extremity swelling, itching   Latex Rash   Causes skin to turn red per patient    Shellfish Allergy Hives   Tape Rash   Coban wrap turned her arm red (after  stress test)        Medication List    AeroChamber MV inhaler 1 each by Other route daily. Use as instructed   albuterol  108 (90 Base) MCG/ACT inhaler Commonly known as: VENTOLIN  HFA Inhale 2 puffs into the lungs every 4 (four) hours as needed for wheezing or shortness of breath.   amiodarone  200 MG tablet Commonly known as: PACERONE  TAKE 1 TABLET EVERY DAY   aspirin  EC 81 MG tablet Take 1 tablet (81 mg total) by mouth daily. Swallow whole.   budesonide -formoterol  80-4.5 MCG/ACT inhaler Commonly known as: Symbicort  Inhale 2 puffs into the lungs in the morning and at bedtime.   cetirizine  10 MG tablet Commonly known as: ZYRTEC  TAKE 1 TABLET 2 (TWO) TIMES DAILY AS NEEDED FOR ALLERGIES (CAN USE AN EXTRA DOSE DURING FLARE UPS.)   EPINEPHrine  0.3 mg/0.3 mL Soaj injection Commonly known as: EPI-PEN Inject 0.3 mg into the muscle as needed (anaphylaxis).   famotidine  40 MG tablet Commonly known as: PEPCID  Take 1 tablet (40 mg total) by mouth daily. What changed: See the new instructions.   Fasenra  30 MG/ML prefilled syringe Generic drug: benralizumab  Inject 1 mL (30 mg total) into the skin every 8 (eight) weeks.   Fluad Quadrivalent 0.5 ML injection Generic drug: influenza vaccine adjuvanted Inject 0.5 mLs into the muscle once.   fluticasone  50 MCG/ACT nasal spray Commonly known as: Flonase  Place 1 spray into both nostrils daily.   furosemide  40 MG tablet Commonly  known as: LASIX  TAKE 1 TABLET EVERY DAY AND AN EXTRA 1/2 TABLET DAILY AS NEEDED   hydrALAZINE  25 MG tablet Commonly known as: APRESOLINE  Take 0.5 tablets (12.5 mg total) by mouth 3 (three) times daily as needed (12.5 mg up to three times daily as needed for Systolic BP greater than 160).   ipratropium-albuterol  0.5-2.5 (3) MG/3ML Soln Commonly known as: DUONEB Take 3 mLs by nebulization every 6 (six) hours as needed (wheezing, shortness of breath).   isosorbide  mononitrate 30 MG 24 hr tablet Commonly  known as: IMDUR  Take 1 tablet (30 mg total) by mouth daily.   ketorolac  10 MG tablet Commonly known as: TORADOL  Take 1 tablet (10 mg total) by mouth every 6 (six) hours as needed.   levocetirizine 5 MG tablet Commonly known as: XYZAL  Take 1 tablet (5 mg total) by mouth daily as needed for allergies (Can take an extra dose during flare ups.).   methocarbamol  500 MG tablet Commonly known as: ROBAXIN  Take 1 tablet (500 mg total) by mouth 2 (two) times daily.   metoprolol  succinate 50 MG 24 hr tablet Commonly known as: TOPROL -XL Take 1 tablet (50 mg total) by mouth at bedtime. Take with or immediately following a meal.   misoprostol 100 MCG tablet Commonly known as: CYTOTEC Take by mouth.   montelukast  10 MG tablet Commonly known as: SINGULAIR  Take 1 tablet (10 mg total) by mouth at bedtime.   Nebulizer Mask Adult Misc 1 Device by Does not apply route as directed.   omeprazole  40 MG capsule Commonly known as: PRILOSEC Take 1 capsule (40 mg total) by mouth daily.   PROBIOTIC DAILY PO Take 1 capsule by mouth daily.   simvastatin  40 MG tablet Commonly known as: ZOCOR  TAKE 1 TABLET EVERY DAY   Spikevax injection Generic drug: COVID-19 mRNA vaccine (Moderna, >/= 57yrs) Inject 0.5 mLs into the muscle once.    Past Medical History:  Diagnosis Date   Arthritis    Asthma    CHF (congestive heart failure) (HCC)    Diabetes mellitus without complication (HCC)    borderline   Diarrhea    Diverticulosis    Dysrhythmia    controlled with Metoprolol -12-19-13 LOV -Dr. Court sees yearly   Family history of premature CAD    GERD (gastroesophageal reflux disease)    Heart murmur    yrs ago   HLD (hyperlipidemia)    pt reports cholesterol normal now (07/21/19).   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 DOROTHA Court)   CATARACT EXTRACTION, BILATERAL     most recent 11/19   COLONOSCOPY     ESOPHAGEAL DILATION  2014   infected lymph node surgery  80's   NM MYOCAR PERF WALL MOTION  04/2011   bruce myoview ; normal pattern of perfusion, EF 80%, low risk scan   SINUS ENDO WITH FUSION Bilateral 03/04/2020   Procedure: SINUS ENDOSCOPY WITH FUSION NAVIGATION;  Surgeon: Mable Lenis, MD;  Location: [facility];  Service: ENT;  Laterality: Bilateral;  TONSILLECTOMY  70's   TRANSTHORACIC ECHOCARDIOGRAM  12/2007   mild conc LVH   TURBINATE REDUCTION Bilateral 03/04/2020   Procedure: TURBINATE REDUCTION;  Surgeon: Mable Lenis, MD;  Location: [facility];  Service: ENT;  Laterality: Bilateral;    Review of systems negative except as noted in HPI / PMHx or noted below:  Review of Systems  Constitutional: Negative.   HENT: Negative.    Eyes: Negative.   Respiratory: Negative.    Cardiovascular: Negative.   Gastrointestinal: Negative.   Genitourinary: Negative.   Musculoskeletal: Negative.   Skin: Negative.   Neurological: Negative.   Endo/Heme/Allergies: Negative.   Psychiatric/Behavioral: Negative.       Objective:   Vitals:   02/19/24 1049  BP: 130/64  Pulse: 61  Resp: 16  Temp: 97.9 F (36.6 C)  SpO2: 96%   Height: 5' 5 (165.1 cm)  Weight: 183 lb 12.8 oz (83.4 kg)   Physical Exam Constitutional:      Appearance: She is not diaphoretic.  HENT:     Head: Normocephalic.     Right Ear: Tympanic membrane, ear canal and external ear normal.     Left Ear: Tympanic membrane, ear canal and external ear normal.     Nose:  Nose normal. No mucosal edema or rhinorrhea.     Mouth/Throat:     Pharynx: Uvula midline. No oropharyngeal exudate.  Eyes:     Conjunctiva/sclera: Conjunctivae normal.  Neck:     Thyroid : No thyromegaly.     Trachea: Trachea normal. No tracheal tenderness or tracheal deviation.  Cardiovascular:     Rate and Rhythm: Normal rate and regular rhythm.     Heart sounds: Normal heart sounds, S1 normal and S2 normal. No murmur heard. Pulmonary:     Effort: No respiratory distress.     Breath sounds: Normal breath sounds. No stridor. No wheezing or rales.  Lymphadenopathy:     Head:     Right side of head: No tonsillar adenopathy.     Left side of head: No tonsillar adenopathy.     Cervical: No cervical adenopathy.  Skin:    Findings: No erythema or rash.     Nails: There is no clubbing.  Neurological:     Mental Status: She is alert.     Diagnostics: Spirometry was performed and demonstrated an FEV1 of 1.30 at 80 % of predicted.  Results of blood tests obtained 13 November 2023 identifies alpha gal IgE 55.1 KU/L, pork 23.2 KU/L, beef 52.1 KU/L, lamb 29.80 KU/L, total IgE 875 KU/L.  Assessment and Plan:   1. Eosinophilic asthma   2. Perennial allergic rhinitis   3. Allergy with anaphylaxis due to food   4. LPRD (laryngopharyngeal reflux disease)    1.  Allergen avoidance measures - dust mite, cat, dog, mammal consumption  2.  Continue to treat and prevent inflammation:  A. Symbicort  160 - 2  inhalations 2 times per day w/spacer (empty lungs).  B. Benralizumab  injections every 8 weeks C. Montelukast  10 mg - 1 tablet 1 time per day.    3.  Continue to treat and prevent reflux/LPR:  A. Omeprazole  40 mg - 1 tablet in AM B. Famotidine  40 mg - 1 tablet in PM  4.  If needed:  A. Albuterol  MDI or nebulization B. Nasal saline C. Antihistamine D. Epi-Pen   5. Influenza = Tamiflu. Covid = Paxlovid  6. Return to clinic in 3 months or earlier if problem  Megan Hull appears to be  doing very well on her current plan and she will maintain anti-inflammatory therapy with anti-IL-5 receptor monoclonal antibody as well as inhaled and oral anti-inflammatory agents for her respiratory tract and she will also continue to treat her LPR with a combination of a proton pump inhibitor and H2 receptor blocker.  Obviously she is not going to consume any mammal.  Assuming she does well we will see her back in this clinic in 3 months or earlier if there is a problem.  Megan Denis, MD Allergy / Immunology [facility]

## 2024-02-20 ENCOUNTER — Encounter: Payer: Self-pay | Admitting: Allergy and Immunology

## 2024-02-21 DIAGNOSIS — I129 Hypertensive chronic kidney disease with stage 1 through stage 4 chronic kidney disease, or unspecified chronic kidney disease: Secondary | ICD-10-CM | POA: Diagnosis not present

## 2024-02-21 DIAGNOSIS — E1159 Type 2 diabetes mellitus with other circulatory complications: Secondary | ICD-10-CM | POA: Diagnosis not present

## 2024-02-21 DIAGNOSIS — M47816 Spondylosis without myelopathy or radiculopathy, lumbar region: Secondary | ICD-10-CM | POA: Diagnosis not present

## 2024-02-21 DIAGNOSIS — J45909 Unspecified asthma, uncomplicated: Secondary | ICD-10-CM | POA: Diagnosis not present

## 2024-02-21 DIAGNOSIS — N1831 Chronic kidney disease, stage 3a: Secondary | ICD-10-CM | POA: Diagnosis not present

## 2024-02-21 DIAGNOSIS — I350 Nonrheumatic aortic (valve) stenosis: Secondary | ICD-10-CM | POA: Diagnosis not present

## 2024-02-21 DIAGNOSIS — I2510 Atherosclerotic heart disease of native coronary artery without angina pectoris: Secondary | ICD-10-CM | POA: Diagnosis not present

## 2024-02-21 DIAGNOSIS — E785 Hyperlipidemia, unspecified: Secondary | ICD-10-CM | POA: Diagnosis not present

## 2024-02-21 DIAGNOSIS — E1122 Type 2 diabetes mellitus with diabetic chronic kidney disease: Secondary | ICD-10-CM | POA: Diagnosis not present

## 2024-02-22 DIAGNOSIS — Z23 Encounter for immunization: Secondary | ICD-10-CM | POA: Diagnosis not present

## 2024-03-04 ENCOUNTER — Ambulatory Visit

## 2024-03-04 DIAGNOSIS — J4550 Severe persistent asthma, uncomplicated: Secondary | ICD-10-CM | POA: Diagnosis not present

## 2024-03-13 ENCOUNTER — Encounter: Payer: Self-pay | Admitting: Pulmonary Disease

## 2024-03-13 ENCOUNTER — Ambulatory Visit: Admitting: Pulmonary Disease

## 2024-03-13 VITALS — BP 132/68 | HR 71 | Wt 184.2 lb

## 2024-03-13 DIAGNOSIS — J8283 Eosinophilic asthma: Secondary | ICD-10-CM

## 2024-03-13 DIAGNOSIS — R0602 Shortness of breath: Secondary | ICD-10-CM

## 2024-03-13 DIAGNOSIS — R0982 Postnasal drip: Secondary | ICD-10-CM

## 2024-03-13 DIAGNOSIS — J45909 Unspecified asthma, uncomplicated: Secondary | ICD-10-CM

## 2024-03-13 MED ORDER — IPRATROPIUM BROMIDE 0.03 % NA SOLN: 2.0000 | Freq: Two times a day (BID) | NASAL | 12 refills | Status: DC

## 2024-03-13 MED ORDER — IPRATROPIUM BROMIDE 0.03 % NA SOLN: 2.0000 | Freq: Two times a day (BID) | NASAL | 12 refills | Status: AC

## 2024-03-13 MED ORDER — BUDESONIDE-FORMOTEROL FUMARATE 80-4.5 MCG/ACT IN AERO: 2.0000 | INHALATION_SPRAY | Freq: Two times a day (BID) | RESPIRATORY_TRACT | 11 refills | Status: AC

## 2024-03-13 NOTE — Progress Notes (Unsigned)
 Synopsis: Referred in April 2024 for shortness of breath  Subjective:   PATIENT ID: Megan Hull Coach GENDER: female DOB: November 21, 1940, MRN: [id number]  HPI  Chief Complaint  Patient presents with   Medical Management of Chronic Issues   Megan Hull is an 83 year old woman, former smoker with CHF, DMII, GERD and hypertension who returns to pulmonary clinic for asthma.  OV 09/18/23 She has experienced shortness of breath for several years, which has recently worsened. Initially, it was only with exertion, but now it occurs with minimal activities such as moving around, putting on shoes, and talking. The shortness of breath is more pronounced when walking uphill or on an incline, often requiring her to stop to catch her breath.  She uses a Breztri  inhaler, taking one puff in the morning, and occasionally uses albuterol  when experiencing significant shortness of breath or back pain. Previously, she used albuterol  routinely when she had wheezing, but since the wheezing has resolved, she uses it less frequently. She also uses Fasenra  injections, which have significantly improved her breathing and reduced hospitalizations for shortness of breath.  She has a history of arrhythmia and is currently on amiodarone , which was adjusted from 200 mg to 100 mg due to elevated liver enzymes. She also mentions a history of aortic stenosis, which has progressed from mild to mild-to-moderate, and mild COPD, attributed to her smoking history until 2017.  She experiences occasional shortness of breath at night, which sometimes wakes her up, but she does not wake up in a panic. She has noticed weight gain of about 10 pounds over the last year, which she attributes to decreased metabolism with age.  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 She quit smoking in 2017 after a severe allergy attack that led to hospitalization. She engages in physical  activities such as walking at the [facility] and doing yard work, but notes that her activities are often sedentary, such as working at [employer].  OV 12/18/23 She experiences decreased tolerance for walking up inclines without wheezing. Breztri  is used once daily due to significant hoarseness, with no improvement in breathing when used twice daily. Pulmonary function tests were completed this morning. A past chest x-ray showed a high diaphragm.  She uses Fasenra , which has significantly improved her asthma and reduced eosinophil levels, preventing hospitalizations for asthma exacerbations. She recalls a previous hospitalization for severe shortness of breath and wheezing before starting Fasenra .  She has gained three pounds recently, attributed to fluid retention from dietary changes while traveling. She follows a special diet due to alpha-gal syndrome and has been consuming more sodium-rich foods. No significant leg swelling and normal urination are noted.  OV 03/13/24 She experiences worsening allergy symptoms since Sunday, with nasal and head congestion. Nasal irrigation produces mucus discharge.  She has a nocturnal cough when lying down, without wheezing, shortness of breath, or chest tightness. She takes Xyzal  and montelukast  for allergies but avoids nasal sprays due to concerns about steroid effects on A1c levels. She previously used Flonase  but discontinued it.  Reviewed CT Chest scan that did not show evidence of interstitial lung disease. Non-specific band like scarring.  Past Medical History:  Diagnosis Date   Arthritis    Asthma    CHF (congestive heart failure) (HCC)    Diabetes mellitus without complication (HCC)    borderline   Diarrhea    Diverticulosis    Dysrhythmia    controlled  with Metoprolol -12-19-13 LOV -Dr. Court sees yearly   Family history of premature CAD    GERD (gastroesophageal reflux disease)    Heart murmur    yrs ago   HLD (hyperlipidemia)    pt reports  cholesterol normal now (07/21/19).   Hypertension    Lower extremity edema    Status post dilation of esophageal narrowing    Supraventricular arrhythmia    Tobacco abuse      Family History  Problem Relation Age of Onset   Heart disease Brother        MI @ 87, HTN @ 91, DM @ 39    Heart disease Father        MI   Heart disease Mother        MI, CVA @ 89   Heart disease Sister        CHF, CVA   Breast cancer Sister 59   Esophageal cancer Brother    Alzheimer's disease Sister    Heart disease Brother        MI in 69s, lung cancer   Heart disease Sister    Hypertension Sister        x2   Hyperlipidemia Sister        x2   Diabetes Sister        x2   Cancer Sister    Hypertension Brother    Colon cancer Neg Hx      Social History   Socioeconomic History   Marital status: Single    Spouse name: Not on file   Number of children: 0   Years of education: 12+   Highest education level: Not on file  Occupational History   Occupation: Reitred  Tobacco Use   Smoking status: Former    Current packs/day: 0.00    Average packs/day: 0.3 packs/day for 15.0 years (3.8 ttl pk-yrs)    Types: Cigarettes    Start date: 02/03/2001    Quit date: 02/04/2016    Years since quitting: 8.1    Passive exposure: Past   Smokeless tobacco: Never  Vaping Use   Vaping status: Never Used  Substance and Sexual Activity   Alcohol  use: Not Currently    Comment: occasional - tequila shots on holidays   Drug use: No   Sexual activity: Not on file  Other Topics Concern   Not on file  Social History Narrative   Lives alone    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)   Social Drivers of Corporate investment banker Strain: Not on file  Food Insecurity: Not on file  Transportation Needs: Not on file  Physical Activity: Not on file  Stress: Not on file  Social Connections: Not on file  Intimate Partner Violence: Not on file     Allergies  Allergen Reactions   Nexium   [Esomeprazole  Magnesium ] Anaphylaxis    Pt states she was unable to swallow for several hours after taking the nexium    Ace Inhibitors Swelling    Angioedema    Codeine Other (See Comments)    REACTION: chest pain   Monosodium Glutamate Other (See Comments)    dizziness   Other Hives, Swelling and Other (See Comments)    Red Fish (Hives) Bojangle's chicken (swelling and shortness of breath) PAPRIKA TEMPERATURE PROBE COVERS (lip swelling)   Amlodipine  Other (See Comments)    Lower extremity swelling, itching   Latex Rash    Causes skin to turn red per patient  Shellfish Allergy Hives   Tape Rash    Coban wrap turned her arm red (after stress test)     Outpatient Medications Prior to Visit  Medication Sig Dispense Refill   albuterol  (VENTOLIN  HFA) 108 (90 Base) MCG/ACT inhaler Inhale 2 puffs into the lungs every 4 (four) hours as needed for wheezing or shortness of breath. 18 g 1   amiodarone  (PACERONE ) 200 MG tablet TAKE 1 TABLET EVERY DAY 90 tablet 3   aspirin  EC 81 MG tablet Take 1 tablet (81 mg total) by mouth daily. Swallow whole. 90 tablet 3   benralizumab  (FASENRA ) 30 MG/ML prefilled syringe Inject 1 mL (30 mg total) into the skin every 8 (eight) weeks. 1 mL 6   budesonide -formoterol  (SYMBICORT ) 80-4.5 MCG/ACT inhaler Inhale 2 puffs into the lungs in the morning and at bedtime. 10.2 g 11   cetirizine  (ZYRTEC ) 10 MG tablet TAKE 1 TABLET 2 (TWO) TIMES DAILY AS NEEDED FOR ALLERGIES (CAN USE AN EXTRA DOSE DURING FLARE UPS.) 180 tablet 3   EPINEPHrine  0.3 mg/0.3 mL IJ SOAJ injection Inject 0.3 mg into the muscle as needed (anaphylaxis). 2 each 1   famotidine  (PEPCID ) 40 MG tablet Take 1 tablet (40 mg total) by mouth daily. 30 tablet 5   FLUAD QUADRIVALENT 0.5 ML injection Inject 0.5 mLs into the muscle once.     fluticasone  (FLONASE ) 50 MCG/ACT nasal spray Place 1 spray into both nostrils daily. 48 g 1   furosemide  (LASIX ) 40 MG tablet TAKE 1 TABLET EVERY DAY AND AN EXTRA 1/2  TABLET DAILY AS NEEDED 135 tablet 0   hydrALAZINE  (APRESOLINE ) 25 MG tablet Take 0.5 tablets (12.5 mg total) by mouth 3 (three) times daily as needed (12.5 mg up to three times daily as needed for Systolic BP greater than 160). 270 tablet 1   ipratropium-albuterol  (DUONEB) 0.5-2.5 (3) MG/3ML SOLN Take 3 mLs by nebulization every 6 (six) hours as needed (wheezing, shortness of breath). 150 mL 1   isosorbide  mononitrate (IMDUR ) 30 MG 24 hr tablet Take 1 tablet (30 mg total) by mouth daily. 90 tablet 1   ketorolac  (TORADOL ) 10 MG tablet Take 1 tablet (10 mg total) by mouth every 6 (six) hours as needed. 20 tablet 0   levocetirizine (XYZAL ) 5 MG tablet Take 1 tablet (5 mg total) by mouth daily as needed for allergies (Can take an extra dose during flare ups.). 180 tablet 1   methocarbamol  (ROBAXIN ) 500 MG tablet Take 1 tablet (500 mg total) by mouth 2 (two) times daily. 20 tablet 0   metoprolol  succinate (TOPROL -XL) 50 MG 24 hr tablet Take 1 tablet (50 mg total) by mouth at bedtime. Take with or immediately following a meal. 90 tablet 1   misoprostol (CYTOTEC) 100 MCG tablet Take by mouth.     montelukast  (SINGULAIR ) 10 MG tablet Take 1 tablet (10 mg total) by mouth at bedtime. 90 tablet 1   omeprazole  (PRILOSEC) 40 MG capsule Take 1 capsule (40 mg total) by mouth daily. 30 capsule 5   Probiotic Product (PROBIOTIC DAILY PO) Take 1 capsule by mouth daily.     Respiratory Therapy Supplies (NEBULIZER MASK ADULT) MISC 1 Device by Does not apply route as directed. 1 each 1   simvastatin  (ZOCOR ) 40 MG tablet TAKE 1 TABLET EVERY DAY 90 tablet 0   Spacer/Aero-Holding Chambers (AEROCHAMBER MV) inhaler 1 each by Other route daily. Use as instructed 1 each 1   SPIKEVAX injection Inject 0.5 mLs into the muscle once.  Facility-Administered Medications Prior to Visit  Medication Dose Route Frequency Provider Last Rate Last Admin   Benralizumab  SOSY 30 mg  30 mg Subcutaneous Q8 weeks Jeneal Danita Macintosh, MD    30 mg at 03/04/24 1117    Review of Systems  Constitutional:  Negative for chills, fever, malaise/fatigue and weight loss.  HENT:  Negative for congestion, sinus pain and sore throat.   Eyes: Negative.   Respiratory:  Positive for shortness of breath. Negative for cough, hemoptysis, sputum production and wheezing.   Cardiovascular:  Negative for chest pain, palpitations, orthopnea, claudication and leg swelling.  Gastrointestinal:  Negative for abdominal pain, heartburn, nausea and vomiting.  Genitourinary: Negative.   Musculoskeletal:  Negative for joint pain and myalgias.  Skin:  Negative for rash.  Neurological:  Negative for weakness.  Endo/Heme/Allergies:  Positive for environmental allergies.  Psychiatric/Behavioral: Negative.     Objective:   Vitals:   03/13/24 1132  BP: 132/68  Pulse: 71  SpO2: 94%  Weight: 184 lb 3.2 oz (83.6 kg)   Physical Exam Constitutional:      General: She is not in acute distress.    Appearance: Normal appearance.  Eyes:     General: No scleral icterus.    Conjunctiva/sclera: Conjunctivae normal.  Cardiovascular:     Rate and Rhythm: Normal rate and regular rhythm.     Heart sounds: Murmur heard.  Pulmonary:     Breath sounds: No wheezing, rhonchi or rales.  Musculoskeletal:     Right lower leg: No edema.     Left lower leg: No edema.  Skin:    General: Skin is warm and dry.  Neurological:     General: No focal deficit present.     CBC    Component Value Date/Time   WBC 3.6 (L) 02/03/2024 1225   RBC 4.30 02/03/2024 1225   HGB 13.1 02/03/2024 1225   HGB 12.9 07/19/2023 1235   HCT 40.1 02/03/2024 1225   HCT 40.1 07/19/2023 1235   PLT 210 02/03/2024 1225   PLT 229 07/19/2023 1235   MCV 93.3 02/03/2024 1225   MCV 93 07/19/2023 1235   MCH 30.5 02/03/2024 1225   MCHC 32.7 02/03/2024 1225   RDW 13.3 02/03/2024 1225   RDW 12.8 07/19/2023 1235   LYMPHSABS 2.3 08/31/2021 2308   MONOABS 0.6 08/31/2021 2308   EOSABS 0.0  08/31/2021 2308   BASOSABS 0.0 08/31/2021 2308      Latest Ref Rng & Units 02/03/2024   12:25 PM 05/22/2023    3:38 PM 03/14/2023   10:15 AM  BMP  Glucose 70 - 99 mg/dL 855  88  861   BUN 8 - 23 mg/dL 12  17  18    Creatinine 0.44 - 1.00 mg/dL 8.88  8.80  8.71   BUN/Creat Ratio 12 - 28  14  14    Sodium 135 - 145 mmol/L 137  139  137   Potassium 3.5 - 5.1 mmol/L 4.1  4.2  4.1   Chloride 98 - 111 mmol/L 103  100  100   CO2 22 - 32 mmol/L 22  27  26    Calcium  8.9 - 10.3 mg/dL 8.7  8.9  8.8    Chest imaging: HRCT Chest 01/02/24 1. Mild, bland appearing bandlike scarring of the bilateral lung bases. No evidence of fibrotic interstitial lung disease. 2. Coronary artery disease. 3. Aortic valve calcifications. Correlate for echocardiographic evidence of aortic valve dysfunction.   PFT:    Latest Ref Rng &  Units 12/18/2023   10:35 AM  PFT Results  FVC-Pre L 1.90   FVC-Predicted Pre % 73   FVC-Post L 1.82   FVC-Predicted Post % 69   Pre FEV1/FVC % % 79   Post FEV1/FCV % % 83   FEV1-Pre L 1.50   FEV1-Predicted Pre % 77   FEV1-Post L 1.52   DLCO uncorrected ml/min/mmHg 13.64   DLCO UNC% % 70   DLVA Predicted % 94   TLC L 4.70   TLC % Predicted % 90   RV % Predicted % 108     Labs:  Path:  Echo 06/21/23: LV EF 70-75% with hyperdynamic function. RV size and systolic function are normal. Aortic valve is calcified with moderate thickening - moderate aortic stenosis VTI 0.85cm.  Heart Catheterization:    Assessment & Plan:   Eosinophilic asthma  Discussion: Kinda Pottle is an 83 year old woman, former smoker with CHF, DMII, GERD and hypertension who returns to pulmonary clinic for asthma and dyspnea.  Shortness of breath Multifactorial in setting of asthma, aortic stenosis and mild diffusion defect on PFTs. - No ILD noted on recent HRCT Chest - ECHO monitored by cardiology  Severe asthma Managed with Fasenra , symbicort  and as needed albuterol  - Continue Fasenra   injections and albuterol  as needed. - Continue symbicort  80-4.56mcg 2 puffs twice daily  Seasonal Allergies Allergic Rhinits - use flonase  as needed - use ipratropium nasals spray, 2 sprays per nostril twice daily  Aortic stenosis - Continue monitoring by cardiology  Follow up in 6 months  Dorn Chill, MD [facility] Office: [phone]    Current Outpatient Medications:    albuterol  (VENTOLIN  HFA) 108 (90 Base) MCG/ACT inhaler, Inhale 2 puffs into the lungs every 4 (four) hours as needed for wheezing or shortness of breath., Disp: 18 g, Rfl: 1   amiodarone  (PACERONE ) 200 MG tablet, TAKE 1 TABLET EVERY DAY, Disp: 90 tablet, Rfl: 3   aspirin  EC 81 MG tablet, Take 1 tablet (81 mg total) by mouth daily. Swallow whole., Disp: 90 tablet, Rfl: 3   benralizumab  (FASENRA ) 30 MG/ML prefilled syringe, Inject 1 mL (30 mg total) into the skin every 8 (eight) weeks., Disp: 1 mL, Rfl: 6   budesonide -formoterol  (SYMBICORT ) 80-4.5 MCG/ACT inhaler, Inhale 2 puffs into the lungs in the morning and at bedtime., Disp: 10.2 g, Rfl: 11   cetirizine  (ZYRTEC ) 10 MG tablet, TAKE 1 TABLET 2 (TWO) TIMES DAILY AS NEEDED FOR ALLERGIES (CAN USE AN EXTRA DOSE DURING FLARE UPS.), Disp: 180 tablet, Rfl: 3   EPINEPHrine  0.3 mg/0.3 mL IJ SOAJ injection, Inject 0.3 mg into the muscle as needed (anaphylaxis)., Disp: 2 each, Rfl: 1   famotidine  (PEPCID ) 40 MG tablet, Take 1 tablet (40 mg total) by mouth daily., Disp: 30 tablet, Rfl: 5   FLUAD QUADRIVALENT 0.5 ML injection, Inject 0.5 mLs into the muscle once., Disp: , Rfl:    fluticasone  (FLONASE ) 50 MCG/ACT nasal spray, Place 1 spray into both nostrils daily., Disp: 48 g, Rfl: 1   furosemide  (LASIX ) 40 MG tablet, TAKE 1 TABLET EVERY DAY AND AN EXTRA 1/2 TABLET DAILY AS NEEDED, Disp: 135 tablet, Rfl: 0   hydrALAZINE  (APRESOLINE ) 25 MG tablet, Take 0.5 tablets (12.5 mg total) by mouth 3 (three) times daily as needed (12.5 mg up to three times daily  as needed for Systolic BP greater than 160)., Disp: 270 tablet, Rfl: 1   ipratropium-albuterol  (DUONEB) 0.5-2.5 (3) MG/3ML SOLN, Take 3 mLs by nebulization  every 6 (six) hours as needed (wheezing, shortness of breath)., Disp: 150 mL, Rfl: 1   isosorbide  mononitrate (IMDUR ) 30 MG 24 hr tablet, Take 1 tablet (30 mg total) by mouth daily., Disp: 90 tablet, Rfl: 1   ketorolac  (TORADOL ) 10 MG tablet, Take 1 tablet (10 mg total) by mouth every 6 (six) hours as needed., Disp: 20 tablet, Rfl: 0   levocetirizine (XYZAL ) 5 MG tablet, Take 1 tablet (5 mg total) by mouth daily as needed for allergies (Can take an extra dose during flare ups.)., Disp: 180 tablet, Rfl: 1   methocarbamol  (ROBAXIN ) 500 MG tablet, Take 1 tablet (500 mg total) by mouth 2 (two) times daily., Disp: 20 tablet, Rfl: 0   metoprolol  succinate (TOPROL -XL) 50 MG 24 hr tablet, Take 1 tablet (50 mg total) by mouth at bedtime. Take with or immediately following a meal., Disp: 90 tablet, Rfl: 1   misoprostol (CYTOTEC) 100 MCG tablet, Take by mouth., Disp: , Rfl:    montelukast  (SINGULAIR ) 10 MG tablet, Take 1 tablet (10 mg total) by mouth at bedtime., Disp: 90 tablet, Rfl: 1   omeprazole  (PRILOSEC) 40 MG capsule, Take 1 capsule (40 mg total) by mouth daily., Disp: 30 capsule, Rfl: 5   Probiotic Product (PROBIOTIC DAILY PO), Take 1 capsule by mouth daily., Disp: , Rfl:    Respiratory Therapy Supplies (NEBULIZER MASK ADULT) MISC, 1 Device by Does not apply route as directed., Disp: 1 each, Rfl: 1   simvastatin  (ZOCOR ) 40 MG tablet, TAKE 1 TABLET EVERY DAY, Disp: 90 tablet, Rfl: 0   Spacer/Aero-Holding Chambers (AEROCHAMBER MV) inhaler, 1 each by Other route daily. Use as instructed, Disp: 1 each, Rfl: 1   SPIKEVAX injection, Inject 0.5 mLs into the muscle once., Disp: , Rfl:   Current Facility-Administered Medications:    Benralizumab  SOSY 30 mg, 30 mg, Subcutaneous, Q8 weeks, Jeneal Danita Macintosh, MD, 30 mg at 03/04/24 1117

## 2024-03-13 NOTE — Patient Instructions (Addendum)
 Continue symbicort  2 puffs twice daily - rinse mouth out after each use  Continue fasenra  injections  Use flonase  nasal spray 1 spray per nostril daily for sinus congestion and post nasal drip  Start ipratropium nasal spray, 2 sprays per nostril twice daily  Follow up in 6 months

## 2024-03-14 ENCOUNTER — Encounter: Payer: Self-pay | Admitting: Pulmonary Disease

## 2024-03-21 DIAGNOSIS — Z23 Encounter for immunization: Secondary | ICD-10-CM | POA: Diagnosis not present

## 2024-04-01 DIAGNOSIS — N882 Stricture and stenosis of cervix uteri: Secondary | ICD-10-CM | POA: Diagnosis not present

## 2024-04-01 DIAGNOSIS — N950 Postmenopausal bleeding: Secondary | ICD-10-CM | POA: Diagnosis not present

## 2024-04-01 DIAGNOSIS — Z1330 Encounter for screening examination for mental health and behavioral disorders, unspecified: Secondary | ICD-10-CM | POA: Diagnosis not present

## 2024-04-02 ENCOUNTER — Ambulatory Visit: Admitting: Pulmonary Disease

## 2024-04-29 ENCOUNTER — Other Ambulatory Visit: Payer: Self-pay | Admitting: General Practice

## 2024-04-29 ENCOUNTER — Ambulatory Visit

## 2024-04-29 ENCOUNTER — Ambulatory Visit: Admitting: Podiatry

## 2024-04-29 DIAGNOSIS — M79674 Pain in right toe(s): Secondary | ICD-10-CM

## 2024-04-29 DIAGNOSIS — M79675 Pain in left toe(s): Secondary | ICD-10-CM

## 2024-04-29 DIAGNOSIS — J4550 Severe persistent asthma, uncomplicated: Secondary | ICD-10-CM

## 2024-04-29 DIAGNOSIS — B351 Tinea unguium: Secondary | ICD-10-CM

## 2024-04-29 DIAGNOSIS — I2510 Atherosclerotic heart disease of native coronary artery without angina pectoris: Secondary | ICD-10-CM

## 2024-04-29 DIAGNOSIS — E785 Hyperlipidemia, unspecified: Secondary | ICD-10-CM

## 2024-04-29 NOTE — Progress Notes (Signed)
 Subjective:  Patient ID: Megan Hull, female    DOB: 05/01/1941,  MRN: [id number]  Megan Hull presents to clinic today for:  Chief Complaint  Patient presents with   Diabetes    [facility] Diet control diabetes. A1C 6.4. Toenail trim and callus care. Dorsal foot pain met 1 2 days ago. 0 pain at the present.    Patient notes nails are thick, discolored, elongated and painful in shoegear when trying to ambulate.  Patient notes that she has been having some pain in the dorsal aspect of the left midfoot extending down towards the forefoot 1-2 weeks ago.  She states it is not painful today but she wanted to know what it was.  Denies injury.  She thinks she may have to have her she was taken back to the shoe person as she is getting some pressure on the fifth toes bilateral.  PCP is Gerome Brunet, DO.  Past Medical History:  Diagnosis Date   Arthritis    Asthma    CHF (congestive heart failure) (HCC)    Diabetes mellitus without complication (HCC)    borderline   Diarrhea    Diverticulosis    Dysrhythmia    controlled with Metoprolol -12-19-13 LOV -Dr. Court sees yearly   Family history of premature CAD    GERD (gastroesophageal reflux disease)    Heart murmur    yrs ago   HLD (hyperlipidemia)    pt reports cholesterol normal now (07/21/19).   Hypertension    Lower extremity edema    Status post dilation of esophageal narrowing    Supraventricular arrhythmia    Tobacco abuse    Past Surgical History:  Procedure Laterality Date   ANTERIOR CERVICAL DECOMP/DISCECTOMY FUSION N/A 06/08/2015   Procedure: CERVICAL FIVE-SIX ,CERVICAL SIX-SEVEN ANTERIOR CERVICAL DECOMPRESSION/DISCECTOMY FUSION PLATING BONEGRAFT;  Surgeon: Victory Gens, MD;  Location: [facility];  Service: Neurosurgery;  Laterality: N/A;   CARDIAC CATHETERIZATION  01/15/2009   non-critical CAD, 60% mid LAD lesion (Dr. DOROTHA Court)   CATARACT EXTRACTION, BILATERAL     most recent 11/19   COLONOSCOPY      ESOPHAGEAL DILATION  2014   infected lymph node surgery  80's   NM MYOCAR PERF WALL MOTION  04/2011   bruce myoview ; normal pattern of perfusion, EF 80%, low risk scan   SINUS ENDO WITH FUSION Bilateral 03/04/2020   Procedure: SINUS ENDOSCOPY WITH FUSION NAVIGATION;  Surgeon: Mable Lenis, MD;  Location: [facility];  Service: ENT;  Laterality: Bilateral;   TONSILLECTOMY  70's   TRANSTHORACIC ECHOCARDIOGRAM  12/2007   mild conc LVH   TURBINATE REDUCTION Bilateral 03/04/2020   Procedure: TURBINATE REDUCTION;  Surgeon: Mable Lenis, MD;  Location: [facility];  Service: ENT;  Laterality: Bilateral;   Allergies  Allergen Reactions   Nexium  [Esomeprazole  Magnesium ] Anaphylaxis    Pt states she was unable to swallow for several hours after taking the nexium    Ace Inhibitors Swelling    Angioedema    Codeine Other (See Comments)    REACTION: chest pain   Monosodium Glutamate Other (See Comments)    dizziness   Other Hives, Swelling and Other (See Comments)    Red Fish (Hives) Bojangle's chicken (swelling and shortness of breath) PAPRIKA TEMPERATURE PROBE COVERS (lip swelling)   Amlodipine  Other (See Comments)    Lower extremity swelling, itching   Latex Rash    Causes skin to turn red per patient  Shellfish Allergy Hives   Tape Rash    Coban wrap turned her arm red (after stress test)    Review of Systems: Negative except as noted in the HPI.  Objective:  Megan Hull is a pleasant 83 y.o. female in NAD. AAO x 3.  Vascular Examination: Capillary refill time is 3-5 seconds to toes bilateral. Palpable pedal pulses b/l LE. Digital hair present b/l.  Skin temperature gradient WNL b/l. No varicosities b/l. No cyanosis noted b/l.   Dermatological Examination: Pedal skin with normal turgor, texture and tone b/l. No open wounds. No interdigital macerations b/l. Toenails x10 are 3mm thick, discolored, dystrophic with subungual debris. There is  pain with compression of the nail plates.  They are elongated x10.  No pain on palpation to the dorsal left midfoot today.  There is mild collapse of the arch bilateral in resting calcaneal stance position.  Assessment/Plan: 1. Pain due to onychomycosis of toenails of both feet     The mycotic toenails were sharply debrided x10 with sterile nail nippers and a power debriding burr to decrease bulk/thickness and length.    Informed the patient that she was probably experiencing some arthritis pain to the dorsal midfoot.  This makes sense, because at the end of the appointment she stated that she stopped taking her Tylenol  and aspirin  due to being scheduled for an endometrial biopsy tomorrow.  Told patient that Tylenol  is typically okay to continue taking but she should always check with her surgeon.  Cessation of both of these medications most likely caused an arthritis flareup.    Return in about 3 months (around 07/30/2024) for [facility].   Awanda CHARM Imperial, DPM, FACFAS [facility]     [address] [place], [place] [address]                Office [phone]  Fax [phone]

## 2024-04-30 DIAGNOSIS — N816 Rectocele: Secondary | ICD-10-CM | POA: Diagnosis not present

## 2024-04-30 DIAGNOSIS — I509 Heart failure, unspecified: Secondary | ICD-10-CM | POA: Diagnosis not present

## 2024-04-30 DIAGNOSIS — K219 Gastro-esophageal reflux disease without esophagitis: Secondary | ICD-10-CM | POA: Diagnosis not present

## 2024-04-30 DIAGNOSIS — N950 Postmenopausal bleeding: Secondary | ICD-10-CM | POA: Diagnosis not present

## 2024-04-30 DIAGNOSIS — N841 Polyp of cervix uteri: Secondary | ICD-10-CM | POA: Diagnosis not present

## 2024-04-30 DIAGNOSIS — I110 Hypertensive heart disease with heart failure: Secondary | ICD-10-CM | POA: Diagnosis not present

## 2024-04-30 DIAGNOSIS — N882 Stricture and stenosis of cervix uteri: Secondary | ICD-10-CM | POA: Diagnosis not present

## 2024-04-30 DIAGNOSIS — N840 Polyp of corpus uteri: Secondary | ICD-10-CM | POA: Diagnosis not present

## 2024-04-30 DIAGNOSIS — E119 Type 2 diabetes mellitus without complications: Secondary | ICD-10-CM | POA: Diagnosis not present

## 2024-04-30 DIAGNOSIS — I2510 Atherosclerotic heart disease of native coronary artery without angina pectoris: Secondary | ICD-10-CM | POA: Diagnosis not present

## 2024-04-30 DIAGNOSIS — N8110 Cystocele, unspecified: Secondary | ICD-10-CM | POA: Diagnosis not present

## 2024-05-06 ENCOUNTER — Telehealth: Payer: Self-pay | Admitting: Cardiovascular Disease

## 2024-05-06 MED ORDER — METOPROLOL SUCCINATE ER 50 MG PO TB24
50.0000 mg | ORAL_TABLET | Freq: Every day | ORAL | 0 refills | Status: AC
Start: 2024-05-06 — End: ?

## 2024-05-06 NOTE — Telephone Encounter (Signed)
*  STAT* If patient is at the pharmacy, call can be transferred to refill team.   1. Which medications need to be refilled? (please list name of each medication and dose if known) metoprolol  succinate (TOPROL -XL) 50 MG 24 hr tablet    2. Would you like to learn more about the convenience, safety, & potential cost savings by using the [facility]?      3. Are you open to using the [facility] (Type [facility].  ).   4. Which pharmacy/location (including street and city if local pharmacy) is medication to be sent to? [id number] [place], [place] - [address]    5. Do they need a 30 day or 90 day supply? 30 day

## 2024-05-07 NOTE — Telephone Encounter (Signed)
 Megan Hull signed order 05/06/24.

## 2024-05-14 ENCOUNTER — Other Ambulatory Visit: Payer: Self-pay | Admitting: *Deleted

## 2024-05-14 MED ORDER — FASENRA 30 MG/ML ~~LOC~~ SOSY: 30.0000 mg | PREFILLED_SYRINGE | SUBCUTANEOUS | 6 refills | Status: DC

## 2024-05-19 DIAGNOSIS — N841 Polyp of cervix uteri: Secondary | ICD-10-CM | POA: Diagnosis not present

## 2024-05-19 DIAGNOSIS — Z4889 Encounter for other specified surgical aftercare: Secondary | ICD-10-CM | POA: Diagnosis not present

## 2024-05-20 ENCOUNTER — Other Ambulatory Visit

## 2024-05-27 ENCOUNTER — Other Ambulatory Visit: Payer: Self-pay | Admitting: General Practice

## 2024-05-27 ENCOUNTER — Other Ambulatory Visit: Payer: Self-pay | Admitting: Allergy and Immunology

## 2024-05-27 DIAGNOSIS — J8283 Eosinophilic asthma: Secondary | ICD-10-CM

## 2024-06-16 ENCOUNTER — Other Ambulatory Visit: Payer: Self-pay | Admitting: *Deleted

## 2024-06-16 MED ORDER — FASENRA 30 MG/ML ~~LOC~~ SOSY: 30.0000 mg | PREFILLED_SYRINGE | SUBCUTANEOUS | 6 refills | Status: AC

## 2024-06-17 ENCOUNTER — Other Ambulatory Visit (HOSPITAL_COMMUNITY): Payer: Self-pay

## 2024-06-23 ENCOUNTER — Ambulatory Visit (HOSPITAL_COMMUNITY)

## 2024-06-24 ENCOUNTER — Ambulatory Visit

## 2024-06-24 ENCOUNTER — Ambulatory Visit: Admitting: Family

## 2024-06-25 ENCOUNTER — Ambulatory Visit (HOSPITAL_COMMUNITY)
Admission: RE | Admit: 2024-06-25 | Discharge: 2024-06-25 | Disposition: A | Discharge status: Home / Self Care | Source: Ambulatory Visit | Attending: Cardiology | Admitting: Cardiology

## 2024-06-25 DIAGNOSIS — I350 Nonrheumatic aortic (valve) stenosis: Secondary | ICD-10-CM | POA: Diagnosis not present

## 2024-06-26 ENCOUNTER — Ambulatory Visit: Payer: Self-pay | Admitting: Cardiovascular Disease

## 2024-06-26 LAB — ECHOCARDIOGRAM COMPLETE
AR max vel: 1.11 cm2
AV Area VTI: 1.12 cm2
AV Area mean vel: 1.14 cm2
AV Mean grad: 12.5 mmHg
AV Peak grad: 23.5 mmHg
Ao pk vel: 2.43 m/s
Area-P 1/2: 1.84 cm2
S' Lateral: 2.5 cm

## 2024-06-28 ENCOUNTER — Other Ambulatory Visit: Payer: Self-pay | Admitting: Cardiovascular Disease

## 2024-06-30 ENCOUNTER — Encounter: Payer: Self-pay | Admitting: Cardiovascular Disease

## 2024-06-30 ENCOUNTER — Ambulatory Visit: Attending: Cardiovascular Disease | Admitting: Cardiovascular Disease

## 2024-06-30 VITALS — BP 126/50 | HR 66 | Ht 65.0 in | Wt 186.6 lb

## 2024-06-30 DIAGNOSIS — I5032 Chronic diastolic (congestive) heart failure: Secondary | ICD-10-CM

## 2024-06-30 DIAGNOSIS — I350 Nonrheumatic aortic (valve) stenosis: Secondary | ICD-10-CM | POA: Diagnosis not present

## 2024-06-30 NOTE — Patient Instructions (Incomplete)
  1.  Allergen avoidance measures - dust mite, cat, dog, mammal consumption  2.  Continue to treat and prevent inflammation:  A. Symbicort  160 - 2  inhalations 2 times per day w/spacer (empty lungs).  B. Benralizumab  injections every 8 weeks C. Montelukast  10 mg - 1 tablet 1 time per day.    3.  Continue to treat and prevent reflux/LPR:  A. Omeprazole  40 mg - 1 tablet in AM B. Famotidine  40 mg - 1 tablet in PM  4.  If needed:  A. Albuterol  MDI or nebulization B. Nasal saline C. Antihistamine D. Epi-Pen   5. Influenza = Tamiflu. Covid = Paxlovid  6.  Return to clinic in 3 months or earlier if problem

## 2024-06-30 NOTE — Patient Instructions (Signed)
 Medication Instructions:  Your physician recommends that you continue on your current medications as directed. Please refer to the Current Medication list given to you today.  *If you need a refill on your cardiac medications before your next appointment, please call your pharmacy*    Testing/Procedures: Your physician has requested that you have an echocardiogram in 1 year. Echocardiography is a painless test that uses sound waves to create images of your heart. It provides your doctor with information about the size and shape of your heart and how well your hearts chambers and valves are working. This procedure takes approximately one hour. There are no restrictions for this procedure. Please do NOT wear cologne, perfume, aftershave, or lotions (deodorant is allowed). Please arrive 15 minutes prior to your appointment time.  Please note: We ask at that you not bring children with you during ultrasound (echo/ vascular) testing. Due to room size and safety concerns, children are not allowed in the ultrasound rooms during exams. Our front office staff cannot provide observation of children in our lobby area while testing is being conducted. An adult accompanying a patient to their appointment will only be allowed in the ultrasound room at the discretion of the ultrasound technician under special circumstances. We apologize for any inconvenience.   Follow-Up: At [facility], you and your health needs are our priority.  As part of our continuing mission to provide you with exceptional heart care, our providers are all part of one team.  This team includes your primary Cardiologist (physician) and Advanced Practice Providers or APPs (Physician Assistants and Nurse Practitioners) who all work together to provide you with the care you need, when you need it.  Your next appointment:   1 year(s)  Provider:   Dr. Verlin

## 2024-06-30 NOTE — Progress Notes (Signed)
 "  Structural Heart Clinic Note  Chief Complaint  Patient presents with   Follow-up    Aortic stensosis   History of Present Illness: 84 yo [place] with history of DM, CAD, SVT, HLD, HTN, chronic diastolic CHF, alpha gal, PVCs and aortic stenosis who is here today for follow up. Cardiac cath in 2010 with moderate LAD stenosis. No cardiac cath since 2010. Echo January 2025 with LVEF=70-75%. Moderate low flow/low gradient aortic stenosis with mean gradient 14 mmHg, AVA 0.85 cm2, DI 0.27, SVI 33. Her valve appeared to open well. At her visit here in August 2025 she c/o dyspnea that was unchanged over several years and felt to be due to her underlying lung disease. Echo 06/25/24 with LVEF=55-60%. Moderate aortic stenosis with mean gradient 12 mmHg, AVA 1.1 cm2, DI 0.36.   She is here today for follow up. The patient denies any chest pain, palpitations, lower extremity edema, orthopnea, PND, dizziness, near syncope or syncope. Baseline dyspnea that is unchanged for years, mostly when climbing stairs.   She lives in [place], [place] alone. She is retired from hospital administration. She has full dentures.   Primary Care Physician: Gerome Brunet, DO Primary Cardiologist: Court Referring Cardiologist: Court  Past Medical History:  Diagnosis Date   Arthritis    Asthma    CHF (congestive heart failure) (HCC)    Diabetes mellitus without complication (HCC)    borderline   Diarrhea    Diverticulosis    Dysrhythmia    controlled with Metoprolol -12-19-13 LOV -Dr. Court sees yearly   Family history of premature CAD    GERD (gastroesophageal reflux disease)    Heart murmur    yrs ago   HLD (hyperlipidemia)    pt reports cholesterol normal now (07/21/19).   Hypertension    Lower extremity edema    Status post dilation of esophageal narrowing    Supraventricular arrhythmia    Tobacco abuse     Past Surgical History:  Procedure Laterality Date   ANTERIOR CERVICAL DECOMP/DISCECTOMY FUSION N/A  06/08/2015   Procedure: CERVICAL FIVE-SIX ,CERVICAL SIX-SEVEN ANTERIOR CERVICAL DECOMPRESSION/DISCECTOMY FUSION PLATING BONEGRAFT;  Surgeon: Victory Gens, MD;  Location: [facility];  Service: Neurosurgery;  Laterality: N/A;   CARDIAC CATHETERIZATION  01/15/2009   non-critical CAD, 60% mid LAD lesion (Dr. DOROTHA Court)   CATARACT EXTRACTION, BILATERAL     most recent 11/19   COLONOSCOPY     ESOPHAGEAL DILATION  2014   infected lymph node surgery  80's   NM MYOCAR PERF WALL MOTION  04/2011   bruce myoview ; normal pattern of perfusion, EF 80%, low risk scan   SINUS ENDO WITH FUSION Bilateral 03/04/2020   Procedure: SINUS ENDOSCOPY WITH FUSION NAVIGATION;  Surgeon: Mable Lenis, MD;  Location: [facility];  Service: ENT;  Laterality: Bilateral;   TONSILLECTOMY  70's   TRANSTHORACIC ECHOCARDIOGRAM  12/2007   mild conc LVH   TURBINATE REDUCTION Bilateral 03/04/2020   Procedure: TURBINATE REDUCTION;  Surgeon: Mable Lenis, MD;  Location: [facility];  Service: ENT;  Laterality: Bilateral;    Current Outpatient Medications  Medication Sig Dispense Refill   albuterol  (VENTOLIN  HFA) 108 (90 Base) MCG/ACT inhaler Inhale 2 puffs into the lungs every 4 (four) hours as needed for wheezing or shortness of breath. 18 g 1   amiodarone  (PACERONE ) 200 MG tablet TAKE 1 TABLET EVERY DAY 90 tablet 3   aspirin  EC 81 MG tablet Take 1 tablet (81 mg total) by mouth daily. Swallow whole.  90 tablet 3   benralizumab  (FASENRA ) 30 MG/ML prefilled syringe Inject 1 mL (30 mg total) into the skin every 8 (eight) weeks. 1 mL 6   budesonide -formoterol  (SYMBICORT ) 80-4.5 MCG/ACT inhaler Inhale 2 puffs into the lungs in the morning and at bedtime. 10.2 g 11   cetirizine  (ZYRTEC ) 10 MG tablet TAKE 1 TABLET 2 (TWO) TIMES DAILY AS NEEDED FOR ALLERGIES (CAN USE AN EXTRA DOSE DURING FLARE UPS.) 180 tablet 3   EPINEPHrine  0.3 mg/0.3 mL IJ SOAJ injection Inject 0.3 mg into the muscle as needed  (anaphylaxis). 2 each 1   famotidine  (PEPCID ) 40 MG tablet Take 1 tablet (40 mg total) by mouth daily. 30 tablet 5   FLUAD QUADRIVALENT 0.5 ML injection Inject 0.5 mLs into the muscle once.     fluticasone  (FLONASE ) 50 MCG/ACT nasal spray Place 1 spray into both nostrils daily. 48 g 1   furosemide  (LASIX ) 40 MG tablet TAKE 1 TABLET EVERY DAY AND AN EXTRA 1/2 TABLET DAILY AS NEEDED 135 tablet 0   hydrALAZINE  (APRESOLINE ) 25 MG tablet Take 0.5 tablets (12.5 mg total) by mouth 3 (three) times daily as needed (12.5 mg up to three times daily as needed for Systolic BP greater than 160). 270 tablet 1   ipratropium (ATROVENT ) 0.03 % nasal spray Place 2 sprays into both nostrils every 12 (twelve) hours. 30 mL 12   ipratropium-albuterol  (DUONEB) 0.5-2.5 (3) MG/3ML SOLN USE 1 AMPULE IN NEBULIZER EVERY 6 HOURS AS NEEDED FOR WHEEZING FOR SHORTNESS OF BREATH 180 mL 0   isosorbide  mononitrate (IMDUR ) 30 MG 24 hr tablet Take 1 tablet (30 mg total) by mouth daily. 90 tablet 1   levocetirizine (XYZAL ) 5 MG tablet Take 1 tablet (5 mg total) by mouth daily as needed for allergies (Can take an extra dose during flare ups.). 180 tablet 1   methocarbamol  (ROBAXIN ) 500 MG tablet Take 1 tablet (500 mg total) by mouth 2 (two) times daily. 20 tablet 0   metoprolol  succinate (TOPROL -XL) 50 MG 24 hr tablet TAKE 1 TABLET (50 MG TOTAL) BY MOUTH AT BEDTIME. TAKE WITH OR IMMEDIATELY FOLLOWING A MEAL. 90 tablet 3   misoprostol (CYTOTEC) 100 MCG tablet Take by mouth.     montelukast  (SINGULAIR ) 10 MG tablet Take 1 tablet (10 mg total) by mouth at bedtime. 90 tablet 1   omeprazole  (PRILOSEC) 40 MG capsule Take 1 capsule (40 mg total) by mouth daily. 30 capsule 5   Probiotic Product (PROBIOTIC DAILY PO) Take 1 capsule by mouth daily.     Respiratory Therapy Supplies (NEBULIZER MASK ADULT) MISC 1 Device by Does not apply route as directed. 1 each 1   simvastatin  (ZOCOR ) 40 MG tablet TAKE 1 TABLET EVERY DAY (NEED MD APPOINTMENT) 90  tablet 3   Spacer/Aero-Holding Chambers (AEROCHAMBER MV) inhaler 1 each by Other route daily. Use as instructed 1 each 1   SPIKEVAX injection Inject 0.5 mLs into the muscle once.     Current Facility-Administered Medications  Medication Dose Route Frequency Provider Last Rate Last Admin   Benralizumab  SOSY 30 mg  30 mg Subcutaneous Q8 weeks Jeneal Danita Macintosh, MD   30 mg at 04/29/24 1023    Allergies  Allergen Reactions   Nexium  [Esomeprazole  Magnesium ] Anaphylaxis    Pt states she was unable to swallow for several hours after taking the nexium    Ace Inhibitors Swelling    Angioedema    Codeine Other (See Comments)    REACTION: chest pain   Monosodium Glutamate  Other (See Comments)    dizziness   Other Hives, Swelling and Other (See Comments)    Red Fish (Hives) Bojangle's chicken (swelling and shortness of breath) PAPRIKA TEMPERATURE PROBE COVERS (lip swelling)   Amlodipine  Other (See Comments)    Lower extremity swelling, itching   Latex Rash    Causes skin to turn red per patient    Shellfish Allergy Hives   Tape Rash    Coban wrap turned her arm red (after stress test)    Social History   Socioeconomic History   Marital status: Single    Spouse name: Not on file   Number of children: 0   Years of education: 12+   Highest education level: Not on file  Occupational History   Occupation: Reitred  Tobacco Use   Smoking status: Former    Current packs/day: 0.00    Average packs/day: 0.3 packs/day for 15.0 years (3.8 ttl pk-yrs)    Types: Cigarettes    Start date: 02/03/2001    Quit date: 02/04/2016    Years since quitting: 8.4    Passive exposure: Past   Smokeless tobacco: Never  Vaping Use   Vaping status: Never Used  Substance and Sexual Activity   Alcohol  use: Not Currently    Comment: occasional - tequila shots on holidays   Drug use: No   Sexual activity: Not on file  Other Topics Concern   Not on file  Social History Narrative   Lives alone     Caffeine use: 2 cups coffee per day (mixed decaf/regular)   Social Drivers of Health   Tobacco Use: Medium Risk (06/30/2024)   Patient History    Smoking Tobacco Use: Former    Smokeless Tobacco Use: Never    Passive Exposure: Past  Physicist, Medical Strain: Low Risk (05/18/2024)   Received from [facility]   Overall Financial Resource Strain (CARDIA)    How hard is it for you to pay for the very basics like food, housing, medical care, and heating?: Not hard at all  Food Insecurity: No Food Insecurity (05/18/2024)   Received from [facility]   Epic    Within the past 12 months, you worried that your food would run out before you got the money to buy more.: Never true    Within the past 12 months, the food you bought just didn't last and you didn't have money to get more.: Never true  Transportation Needs: No Transportation Needs (05/18/2024)   Received from [facility]    In the past 12 months, has lack of transportation kept you from medical appointments or from getting medications?: No    In the past 12 months, has lack of transportation kept you from meetings, work, or from getting things needed for daily living?: No  Physical Activity: Insufficiently Active (05/18/2024)   Received from [facility]   Exercise Vital Sign    On average, how many days per week do you engage in moderate to strenuous exercise (like a brisk walk)?: 4 days    On average, how many minutes do you engage in exercise at this level?: 30 min  Stress: No Stress Concern Present (05/18/2024)   Received from [facility] - Occupational Stress Questionnaire    Do you feel stress - tense, restless, nervous, or anxious, or unable to sleep at night because your mind is troubled all the time - these days?: Not at all  Social Connections:  Moderately Integrated (05/18/2024)   Received from [facility]   Social Network    How would you rate your social network  (family, work, friends)?: Adequate participation with social networks  Intimate Partner Violence: Patient Declined (05/18/2024)   Received from [facility]   HITS    Over the last 12 months how often did your partner physically hurt you?: Patient declined    Over the last 12 months how often did your partner insult you or talk down to you?: Patient declined    Over the last 12 months how often did your partner threaten you with physical harm?: Patient declined    Over the last 12 months how often did your partner scream or curse at you?: Patient declined  Depression (PHQ2-9): Not on file  Alcohol  Screen: Not on file  Housing: Unknown (05/19/2024)   Received from [facility]   Epic    Unable to Pay for Housing in the Last Year: Not on file    In the past 12 months, how many times have you moved where you were living?: 0    Homeless in the Last Year: Not on file  Utilities: Not At Risk (05/18/2024)   Received from [facility]    In the past 12 months has the electric, gas, oil, or water company threatened to shut off services in your home?: No  Health Literacy: Not on file    Family History  Problem Relation Age of Onset   Heart disease Brother        MI @ 8, HTN @ 39, DM @ 58    Heart disease Father        MI   Heart disease Mother        MI, CVA @ 61   Heart disease Sister        CHF, CVA   Breast cancer Sister 80   Esophageal cancer Brother    Alzheimer's disease Sister    Heart disease Brother        MI in 71s, lung cancer   Heart disease Sister    Hypertension Sister        x2   Hyperlipidemia Sister        x2   Diabetes Sister        x2   Cancer Sister    Hypertension Brother    Colon cancer Neg Hx     Review of Systems:  As stated in the HPI and otherwise negative.   BP (!) 126/50   Pulse 66   Ht 5' 5 (1.651 m)   Wt 186 lb 9.6 oz (84.6 kg)   SpO2 97%   BMI 31.05 kg/m   Physical Examination: General: Well developed, well nourished, NAD   SKIN: warm, dry. Neuro: No focal deficits  Psychiatric: Mood and affect normal  Neck: No JVD Lungs:Clear bilaterally, no wheezes, rhonci, crackles Cardiovascular: Regular rate and rhythm. Systolic murmur.  Abdomen:Soft.  Extremities: No lower extremity edema.    EKG:  EKG is ordered today. The ekg ordered today demonstrates  EKG Interpretation Date/Time:  Monday June 30 2024 13:30:50 EST Ventricular Rate:  66 PR Interval:  242 QRS Duration:  78 QT Interval:  438 QTC Calculation: 459 R Axis:   -16  Text Interpretation: Sinus rhythm with 1st degree A-V block Confirmed by Verlin Bruckner [phone]) on 06/30/2024 1:34:49 PM   Echo 06/25/24: 1. Left ventricular ejection fraction, by estimation, is 55 to 60%. The  left ventricle  has normal function. The left ventricle has no regional  wall motion abnormalities. There is moderate left ventricular hypertrophy.  Left ventricular diastolic  parameters are consistent with Grade I diastolic dysfunction (impaired  relaxation).   2. Right ventricular systolic function is normal. The right ventricular  size is normal.   3. The mitral valve is normal in structure. No evidence of mitral valve  regurgitation. No evidence of mitral stenosis.   4. DI 0.35. The aortic valve is calcified. There is moderate  calcification of the aortic valve. There is mild thickening of the aortic  valve. Aortic valve regurgitation is not visualized. Mild to moderate  aortic valve stenosis. Aortic valve area, by VTI   measures 1.12 cm. Aortic valve mean gradient measures 12.5 mmHg. Aortic  valve Vmax measures 2.42 m/s.   5. The inferior vena cava is normal in size with greater than 50%  respiratory variability, suggesting right atrial pressure of 3 mmHg.   FINDINGS   Left Ventricle: Left ventricular ejection fraction, by estimation, is 55  to 60%. The left ventricle has normal function. The left ventricle has no  regional wall motion abnormalities. The  left ventricular internal cavity  size was normal in size. There is   moderate left ventricular hypertrophy. Left ventricular diastolic  parameters are consistent with Grade I diastolic dysfunction (impaired  relaxation).   Right Ventricle: The right ventricular size is normal. No increase in  right ventricular wall thickness. Right ventricular systolic function is  normal.   Left Atrium: Left atrial size was normal in size.   Right Atrium: Right atrial size was normal in size.   Pericardium: There is no evidence of pericardial effusion.   Mitral Valve: The mitral valve is normal in structure. Mild mitral annular  calcification. No evidence of mitral valve regurgitation. No evidence of  mitral valve stenosis.   Tricuspid Valve: The tricuspid valve is normal in structure. Tricuspid  valve regurgitation is not demonstrated. No evidence of tricuspid  stenosis.   Aortic Valve: DI 0.35. The aortic valve is calcified. There is moderate  calcification of the aortic valve. There is mild thickening of the aortic  valve. Aortic valve regurgitation is not visualized. Mild to moderate  aortic stenosis is present. Aortic  valve mean gradient measures 12.5 mmHg. Aortic valve peak gradient  measures 23.5 mmHg. Aortic valve area, by VTI measures 1.12 cm.   Pulmonic Valve: The pulmonic valve was normal in structure. Pulmonic valve  regurgitation is not visualized. No evidence of pulmonic stenosis.   Aorta: The aortic root is normal in size and structure.   Venous: The inferior vena cava is normal in size with greater than 50%  respiratory variability, suggesting right atrial pressure of 3 mmHg.   IAS/Shunts: No atrial level shunt detected by color flow Doppler.     LEFT VENTRICLE  PLAX 2D  LVIDd:         3.50 cm   Diastology  LVIDs:         2.50 cm   LV e' medial:    5.00 cm/s  LV PW:         1.20 cm   LV E/e' medial:  15.4  LV IVS:        1.50 cm   LV e' lateral:   4.90 cm/s  LVOT  diam:     2.00 cm   LV E/e' lateral: 15.7  LV SV:         66  LV SV Index:  34  LVOT Area:     3.14 cm  LV IVRT:       124 msec     RIGHT VENTRICLE             IVC  RV S prime:     11.10 cm/s  IVC diam: 1.30 cm  TAPSE (M-mode): 1.9 cm  RVSP:           25.5 mmHg   PULMONARY VEINS                              Diastolic Velocity: 41.70 cm/s                              S/D Velocity:       1.30                              Systolic Velocity:  53.50 cm/s   LEFT ATRIUM             Index        RIGHT ATRIUM           Index  LA diam:        3.70 cm 1.94 cm/m   RA Pressure: 3.00 mmHg  LA Vol (A2C):   54.3 ml 28.43 ml/m  RA Area:     9.67 cm  LA Vol (A4C):   54.5 ml 28.53 ml/m  RA Volume:   20.30 ml  10.63 ml/m  LA Biplane Vol: 55.0 ml 28.79 ml/m   AORTIC VALVE  AV Area (Vmax):    1.11 cm  AV Area (Vmean):   1.14 cm  AV Area (VTI):     1.12 cm  AV Vmax:           242.50 cm/s  AV Vmean:          163.500 cm/s  AV VTI:            0.584 m  AV Peak Grad:      23.5 mmHg  AV Mean Grad:      12.5 mmHg  LVOT Vmax:         85.40 cm/s  LVOT Vmean:        59.300 cm/s  LVOT VTI:          0.209 m  LVOT/AV VTI ratio: 0.36    AORTA  Ao Root diam: 3.30 cm  Ao Asc diam:  2.90 cm   MITRAL VALVE                TRICUSPID VALVE  MV Area (PHT): 1.84 cm     TR Peak grad:   22.5 mmHg  MV Decel Time: 412 msec     TR Vmax:        237.00 cm/s  MV E velocity: 77.00 cm/s   Estimated RAP:  3.00 mmHg  MV A velocity: 106.00 cm/s  RVSP:           25.5 mmHg  MV E/A ratio:  0.73                              SHUNTS  Systemic VTI:  0.21 m                              Systemic Diam: 2.00 cm   Recent Labs: 08/07/2023: TSH 1.68 02/03/2024: ALT 19; BUN 12; Creatinine, Ser 1.11; Hemoglobin 13.1; Platelets 210; Potassium 4.1; Sodium 137    Wt Readings from Last 3 Encounters:  06/30/24 186 lb 9.6 oz (84.6 kg)  03/13/24 184 lb 3.2 oz (83.6 kg)  02/19/24 183 lb 12.8 oz (83.4 kg)     Assessment and Plan:   1. Severe Aortic Valve Stenosis: She has moderate aortic stenosis. NYHA class 1. I have personally reviewed the echo images and her valve is seen to open suggesting moderate aortic stenosis visually.     I have reviewed the natural history of aortic stenosis with the patient and their family members  who are present today. We have discussed the limitations of medical therapy and the poor prognosis associated with symptomatic aortic stenosis. We have reviewed potential treatment options, including palliative medical therapy, conventional surgical aortic valve replacement, and transcatheter aortic valve replacement. We discussed treatment options in the context of the patient's specific comorbid medical conditions.   Will repeat echo in one year.   2. Chronic diastolic CHF: Weight is stable. She has mild abdominal bloating. Will have her take an extra 20 mg of Lasix  over the next few days. Continue with Lasix  40 mg daily after that.     Labs/ tests ordered today include:  Orders Placed This Encounter  Procedures   EKG 12-Lead   ECHOCARDIOGRAM COMPLETE   Disposition:   F/U will be arranged with me in 12 months  Signed, Lonni Cash, MD, [facility] 06/30/2024 2:18 PM    [facility] [address] [place], [place], [place]  [address] Phone: [phone]; Fax: [phone]     "

## 2024-07-01 ENCOUNTER — Ambulatory Visit

## 2024-07-01 ENCOUNTER — Ambulatory Visit: Admitting: Family

## 2024-07-07 NOTE — Patient Instructions (Incomplete)
" °  1.  Allergen avoidance measures - dust mite, cat, dog, mammal consumption  2.  Continue to treat and prevent inflammation:  A. Symbicort  160 - 2  inhalations 2 times per day w/spacer (empty lungs).  B. Benralizumab  injections every 8 weeks C. Montelukast  10 mg - 1 tablet 1 time per day.    3.  Continue to treat and prevent reflux/LPR:  A. Omeprazole  40 mg - 1 tablet in AM B. Famotidine  40 mg - 1 tablet in PM  4.  If needed:  A. Albuterol  MDI or nebulization B. Nasal saline C. Antihistamine D. Epi-Pen   5. Influenza = Tamiflu. Covid = Paxlovid  6.  Return to clinic in months or earlier if problem "

## 2024-07-08 ENCOUNTER — Ambulatory Visit: Admitting: Family

## 2024-07-08 ENCOUNTER — Ambulatory Visit

## 2024-07-09 ENCOUNTER — Other Ambulatory Visit: Payer: Self-pay | Admitting: Family Medicine

## 2024-07-09 DIAGNOSIS — Z1231 Encounter for screening mammogram for malignant neoplasm of breast: Secondary | ICD-10-CM

## 2024-07-14 ENCOUNTER — Ambulatory Visit: Admitting: Family

## 2024-07-14 ENCOUNTER — Ambulatory Visit

## 2024-07-18 ENCOUNTER — Encounter: Payer: Self-pay | Admitting: Family Medicine

## 2024-07-18 ENCOUNTER — Other Ambulatory Visit: Payer: Self-pay

## 2024-07-18 ENCOUNTER — Ambulatory Visit: Admitting: Family Medicine

## 2024-07-18 ENCOUNTER — Ambulatory Visit

## 2024-07-18 VITALS — BP 110/60 | HR 69 | Temp 97.2°F

## 2024-07-18 DIAGNOSIS — Z8709 Personal history of other diseases of the respiratory system: Secondary | ICD-10-CM

## 2024-07-18 DIAGNOSIS — J4550 Severe persistent asthma, uncomplicated: Secondary | ICD-10-CM

## 2024-07-18 DIAGNOSIS — K219 Gastro-esophageal reflux disease without esophagitis: Secondary | ICD-10-CM

## 2024-07-18 DIAGNOSIS — J8283 Eosinophilic asthma: Secondary | ICD-10-CM | POA: Insufficient documentation

## 2024-07-18 DIAGNOSIS — Z91018 Allergy to other foods: Secondary | ICD-10-CM | POA: Insufficient documentation

## 2024-07-18 NOTE — Patient Instructions (Addendum)
" °  1.  Allergen avoidance measures - dust mite, cat, dog, mammal consumption  2.  Continue to treat and prevent inflammation:  A. Symbicort  160 - 2  inhalations 2 times per day w/spacer (empty lungs).  B. Benralizumab  injections every 8 weeks C. Montelukast  10 mg - 1 tablet 1 time per day.    D. Begin Flonase  and azelastine  3.  Continue to treat and prevent reflux/LPR:  A. Omeprazole  40 mg - 1 tablet in AM B. Restart famotidine  40 mg - 1 tablet in PM  4.  If needed:  A. Albuterol  MDI or nebulization B. Nasal saline C. Antihistamine D. Epi-Pen 5. Will refer to ENT  6. Influenza = Tamiflu. Covid = Paxlovid  7. Return to clinic in 3 months or earlier if problem "

## 2024-07-18 NOTE — Progress Notes (Cosign Needed Addendum)
 "  [address] [place] [address] Dept: [phone]  FOLLOW UP NOTE  Patient ID: ODA PLACKE, female    DOB: 28-Feb-1941  Age: 84 y.o. MRN: [id number] Date of Office Visit: 07/18/2024  Assessment  Chief Complaint: Follow-up (States that she has mucous in her throat clearing of throat and hoarseness.)  HPI PRINCETTA UPLINGER is an 84 year old female who presents to clinic for follow-up visit.  She was last seen in this clinic on 02/19/2024 by Dr. Kozlow for evaluation of asthma, allergic rhinitis, alpha gal allergy, and laryngeal pharyngeal reflux.   Discussed the use of AI scribe software for clinical note transcription with the patient, who gave verbal consent to proceed.  History of Present Illness COLINDA BARTH is an 84 year old female with COPD and aortic stenosis who presents with persistent throat discomfort and hoarseness.  She has experienced persistent throat discomfort and hoarseness for the past two to three months. Initially, she was using Breo, which was suspected to cause hoarseness, and was switched to Symbicort . Despite the change, the hoarseness persists, especially after prolonged speaking, such as during church meetings. She needs to rest and cough to regain her voice.  She experiences phlegm in her throat, particularly in the morning, described as a 'lump' that becomes less bothersome throughout the day. No significant coughing, sneezing, or nasal congestion. She has a history of nasal polyps removed about five to six years ago and reports occasional anosmia. She stopped using Flonase  about four weeks ago, suspecting it worsened her symptoms, but noticed no change. Her last environmental allergy skin testing on 04/19/2021 was positive to dust mites, cat, and dog.  She reports intermittent fullness in both of her ears and occasional wax buildup. She denies ear pain or change in hearing. She denies drainage from either ear.  Asthma is reported as well  controlled with no symptoms including shortness of breath, cough, or wheeze with activity or rest. She continues Symbicort  160 - two puffs twice a day with a spacer, montelukast , and infrequently uses her albuterol . She continues Fasenra  injections once every eight weeks with no large or local reaction. She reports a significant decrease in her symptoms of asthma while continuing on Fasenra  injections.  She has a history of esophageal narrowing, previously treated with dilation, but currently has no issues with swallowing or food lodging. 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 She reports that she is able to tolerate dairy products without issues.  Alpha gal testing from 11/13/2023 was positive to alpha gal IgE 55.1  Her current medications ate listed in the chart.   Drug Allergies:  Allergies[1]  Physical Exam: BP 110/60   Pulse 69   Temp (!) 97.2 F (36.2 C)   SpO2 96%    Physical Exam Vitals reviewed.  Constitutional:      Appearance: Normal appearance.  HENT:     Head: Normocephalic and atraumatic.     Right Ear: Tympanic membrane normal.     Left Ear: Tympanic membrane normal.     Nose:     Comments: Bilateral nares slightly erythematous with thin clear nasal drianage noted.     Mouth/Throat:     Pharynx: Oropharynx is clear.  Eyes:     Conjunctiva/sclera: Conjunctivae normal.  Cardiovascular:     Rate and Rhythm: Normal rate and regular rhythm.     Heart sounds: Murmur heard.     Comments: Murmur previously noted Pulmonary:     Effort: Pulmonary effort is  normal.     Breath sounds: Normal breath sounds.     Comments: Lungs clear to ausucltation Musculoskeletal:        General: Normal range of motion.     Cervical back: Normal range of motion and neck supple.  Skin:    General: Skin is warm and dry.  Neurological:     Mental Status: She is alert and oriented to person, place, and time.  Psychiatric:        Mood and Affect: Mood normal.        Behavior:  Behavior normal.        Thought Content: Thought content normal.        Judgment: Judgment normal.     Diagnostics: FVC 1.84 which is 85% of predicted value, FEV1 1.41 which is 86% of predicted value. Spirometry indicates normal ventilatory function.  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 Eosinophilic asthma   3. LPRD (laryngopharyngeal reflux disease)   4. History of nasal polyp     No orders of the defined types were placed in this encounter.   Patient Instructions   1.  Allergen avoidance measures - dust mite, cat, dog, mammal consumption  2.  Continue to treat and prevent inflammation:  A. Symbicort  160 - 2  inhalations 2 times per day w/spacer (empty lungs).  B. Benralizumab  injections every 8 weeks C. Montelukast  10 mg - 1 tablet 1 time per day.    D. Begin Flonase  and azelastine  3.  Continue to treat and prevent reflux/LPR:  A. Omeprazole  40 mg - 1 tablet in AM B. Restart famotidine  40 mg - 1 tablet in PM  4.  If needed:  A. Albuterol  MDI or nebulization B. Nasal saline C. Antihistamine D. Epi-Pen 5. Will refer to ENT  6. Influenza = Tamiflu. Covid = Paxlovid  7. Return to clinic in 3 months or earlier if problem  Return in about 3 months (around 10/15/2024), or if symptoms worsen or fail to improve.    Thank you for the opportunity to care for this patient.  Please do not hesitate to contact me with questions.  Arlean Mutter, FNP [facility]          [1]  Allergies Allergen Reactions   Nexium  [Esomeprazole  Magnesium ] Anaphylaxis    Pt states she was unable to swallow for several hours after taking the nexium    Ace Inhibitors Swelling    Angioedema    Codeine Other (See Comments)    REACTION: chest pain   Monosodium Glutamate Other (See Comments)    dizziness   Other Hives, Swelling and Other (See Comments)    Red Fish (Hives) Bojangle's chicken (swelling and shortness of breath)  PAPRIKA TEMPERATURE PROBE COVERS (lip swelling)   Amlodipine  Other (See Comments)    Lower extremity swelling, itching   Latex Rash    Causes skin to turn red per patient    Shellfish Allergy Hives   Tape Rash    Coban wrap turned her arm red (after stress test)   "

## 2024-08-11 ENCOUNTER — Ambulatory Visit

## 2024-09-04 ENCOUNTER — Ambulatory Visit: Admitting: Podiatry

## 2024-09-11 ENCOUNTER — Ambulatory Visit: Admitting: Family Medicine

## 2024-09-11 ENCOUNTER — Ambulatory Visit

## 2025-06-24 ENCOUNTER — Ambulatory Visit (HOSPITAL_COMMUNITY)
